# Patient Record
Sex: Female | Born: 1960
Health system: Southern US, Community
[De-identification: ages and names within clinical notes are randomized; demographics above are authoritative.]

## PROBLEM LIST (undated history)

## (undated) DIAGNOSIS — I10 Essential (primary) hypertension: Secondary | ICD-10-CM

## (undated) DIAGNOSIS — Z95810 Presence of automatic (implantable) cardiac defibrillator: Secondary | ICD-10-CM

## (undated) DIAGNOSIS — T884XXA Failed or difficult intubation, initial encounter: Secondary | ICD-10-CM

## (undated) DIAGNOSIS — F410 Panic disorder [episodic paroxysmal anxiety] without agoraphobia: Secondary | ICD-10-CM

## (undated) DIAGNOSIS — G4733 Obstructive sleep apnea (adult) (pediatric): Secondary | ICD-10-CM

## (undated) DIAGNOSIS — D689 Coagulation defect, unspecified: Secondary | ICD-10-CM

## (undated) DIAGNOSIS — I2699 Other pulmonary embolism without acute cor pulmonale: Secondary | ICD-10-CM

## (undated) DIAGNOSIS — E7800 Pure hypercholesterolemia, unspecified: Secondary | ICD-10-CM

## (undated) DIAGNOSIS — I420 Dilated cardiomyopathy: Secondary | ICD-10-CM

## (undated) DIAGNOSIS — F329 Major depressive disorder, single episode, unspecified: Secondary | ICD-10-CM

## (undated) DIAGNOSIS — G4730 Sleep apnea, unspecified: Secondary | ICD-10-CM

## (undated) DIAGNOSIS — R51 Headache: Secondary | ICD-10-CM

## (undated) DIAGNOSIS — E039 Hypothyroidism, unspecified: Secondary | ICD-10-CM

## (undated) DIAGNOSIS — D649 Anemia, unspecified: Secondary | ICD-10-CM

## (undated) DIAGNOSIS — I2510 Atherosclerotic heart disease of native coronary artery without angina pectoris: Secondary | ICD-10-CM

## (undated) DIAGNOSIS — R0609 Other forms of dyspnea: Secondary | ICD-10-CM

## (undated) DIAGNOSIS — IMO0001 Reserved for inherently not codable concepts without codable children: Secondary | ICD-10-CM

## (undated) DIAGNOSIS — I5022 Chronic systolic (congestive) heart failure: Secondary | ICD-10-CM

## (undated) DIAGNOSIS — F419 Anxiety disorder, unspecified: Secondary | ICD-10-CM

## (undated) DIAGNOSIS — R000 Tachycardia, unspecified: Secondary | ICD-10-CM

## (undated) DIAGNOSIS — M1990 Unspecified osteoarthritis, unspecified site: Secondary | ICD-10-CM

## (undated) DIAGNOSIS — E876 Hypokalemia: Secondary | ICD-10-CM

## (undated) DIAGNOSIS — N804 Endometriosis of rectovaginal septum and vagina: Secondary | ICD-10-CM

## (undated) DIAGNOSIS — I499 Cardiac arrhythmia, unspecified: Secondary | ICD-10-CM

## (undated) DIAGNOSIS — I428 Other cardiomyopathies: Secondary | ICD-10-CM

## (undated) DIAGNOSIS — R519 Headache, unspecified: Secondary | ICD-10-CM

## (undated) DIAGNOSIS — F32A Depression, unspecified: Secondary | ICD-10-CM

## (undated) DIAGNOSIS — N951 Menopausal and female climacteric states: Secondary | ICD-10-CM

## (undated) DIAGNOSIS — I509 Heart failure, unspecified: Secondary | ICD-10-CM

## (undated) DIAGNOSIS — I5020 Unspecified systolic (congestive) heart failure: Secondary | ICD-10-CM

## (undated) DIAGNOSIS — K219 Gastro-esophageal reflux disease without esophagitis: Secondary | ICD-10-CM

## (undated) DIAGNOSIS — E782 Mixed hyperlipidemia: Secondary | ICD-10-CM

## (undated) DIAGNOSIS — E079 Disorder of thyroid, unspecified: Secondary | ICD-10-CM

## (undated) DIAGNOSIS — I519 Heart disease, unspecified: Secondary | ICD-10-CM

## (undated) HISTORY — DX: Hypokalemia: E87.6

## (undated) HISTORY — PX: TUBAL LIGATION: SHX77

## (undated) HISTORY — DX: Endometriosis of rectovaginal septum and vagina: N80.4

## (undated) HISTORY — DX: Tachycardia, unspecified: R00.0

## (undated) HISTORY — PX: CARPAL TUNNEL RELEASE: SHX101

## (undated) HISTORY — PX: TONSILLECTOMY: SUR1361

## (undated) HISTORY — PX: HAND SURGERY: SHX662

## (undated) HISTORY — DX: Essential (primary) hypertension: I10

## (undated) HISTORY — DX: Anemia, unspecified: D64.9

## (undated) HISTORY — PX: CORONARY ANGIOPLASTY: SHX604

## (undated) HISTORY — PX: FOOT SURGERY: SHX648

## (undated) HISTORY — DX: Coagulation defect, unspecified: D68.9

## (undated) HISTORY — PX: MINOR HEMORRHOIDECTOMY: SHX6238

## (undated) HISTORY — DX: Chronic systolic (congestive) heart failure: I50.22

## (undated) HISTORY — DX: Disorder of thyroid, unspecified: E07.9

## (undated) HISTORY — DX: Presence of automatic (implantable) cardiac defibrillator: Z95.810

## (undated) HISTORY — DX: Sleep apnea, unspecified: G47.30

## (undated) HISTORY — DX: Menopausal and female climacteric states: N95.1

## (undated) HISTORY — PX: DIAGNOSTIC LAPAROSCOPY: SUR761

## (undated) HISTORY — PX: CARDIAC DEFIBRILLATOR PLACEMENT: SHX171

## (undated) HISTORY — DX: Other cardiomyopathies: I42.8

## (undated) HISTORY — PX: TRIGGER FINGER RELEASE: SHX641

## (undated) HISTORY — PX: TOTAL ABDOMINAL HYSTERECTOMY: SHX209

## (undated) HISTORY — PX: CORONARY ARTERY BYPASS GRAFT: SHX141

## (undated) HISTORY — DX: Pure hypercholesterolemia, unspecified: E78.00

## (undated) SURGERY — Surgical Case
Anesthesia: *Unknown

---

## 2001-01-29 HISTORY — PX: CARDIAC CATHETERIZATION: SHX172

## 2003-08-18 ENCOUNTER — Other Ambulatory Visit: Payer: Self-pay

## 2004-04-28 ENCOUNTER — Ambulatory Visit: Payer: Self-pay | Admitting: Internal Medicine

## 2004-07-07 ENCOUNTER — Other Ambulatory Visit: Payer: Self-pay

## 2004-07-08 ENCOUNTER — Observation Stay: Payer: Self-pay | Admitting: Internal Medicine

## 2004-07-08 ENCOUNTER — Other Ambulatory Visit: Payer: Self-pay

## 2004-11-23 ENCOUNTER — Ambulatory Visit: Payer: Self-pay | Admitting: Nurse Practitioner

## 2005-03-16 ENCOUNTER — Ambulatory Visit: Payer: Self-pay | Admitting: Podiatry

## 2005-08-09 ENCOUNTER — Ambulatory Visit: Payer: Self-pay | Admitting: Internal Medicine

## 2006-01-29 DIAGNOSIS — Z95810 Presence of automatic (implantable) cardiac defibrillator: Secondary | ICD-10-CM

## 2006-01-29 HISTORY — DX: Presence of automatic (implantable) cardiac defibrillator: Z95.810

## 2006-04-30 HISTORY — PX: CARDIAC DEFIBRILLATOR PLACEMENT: SHX171

## 2006-08-12 ENCOUNTER — Ambulatory Visit: Payer: Self-pay | Admitting: Family Medicine

## 2006-12-12 ENCOUNTER — Other Ambulatory Visit: Payer: Self-pay

## 2006-12-12 ENCOUNTER — Emergency Department: Payer: Self-pay | Admitting: Emergency Medicine

## 2007-01-09 ENCOUNTER — Ambulatory Visit: Payer: Self-pay | Admitting: Podiatry

## 2007-01-15 ENCOUNTER — Ambulatory Visit: Payer: Self-pay | Admitting: Podiatry

## 2007-08-06 ENCOUNTER — Emergency Department (HOSPITAL_COMMUNITY): Admission: EM | Admit: 2007-08-06 | Discharge: 2007-08-06 | Payer: Self-pay | Admitting: Emergency Medicine

## 2007-10-07 ENCOUNTER — Ambulatory Visit: Payer: Self-pay | Admitting: Family Medicine

## 2010-05-17 ENCOUNTER — Ambulatory Visit: Payer: Self-pay | Admitting: Family Medicine

## 2010-10-26 LAB — CBC
HCT: 38.8
Hemoglobin: 13.5
WBC: 5.2

## 2010-10-26 LAB — DIFFERENTIAL
Basophils Absolute: 0
Eosinophils Relative: 1
Lymphocytes Relative: 39
Lymphs Abs: 2
Monocytes Absolute: 0.4
Neutro Abs: 2.7

## 2010-10-26 LAB — POCT I-STAT, CHEM 8
BUN: 15
Calcium, Ion: 1.18
Chloride: 102
Creatinine, Ser: 0.9
Sodium: 138
TCO2: 30

## 2010-10-26 LAB — POCT CARDIAC MARKERS
Myoglobin, poc: 86.2
Myoglobin, poc: 90.3
Operator id: 133351
Operator id: 277751
Troponin i, poc: <0.05

## 2010-10-26 LAB — DIGOXIN LEVEL: Digoxin Level: 0.4 — ABNORMAL LOW

## 2011-07-16 ENCOUNTER — Ambulatory Visit: Payer: Self-pay | Admitting: Internal Medicine

## 2011-07-21 LAB — COMPREHENSIVE METABOLIC PANEL
Albumin: 3.2 g/dL — ABNORMAL LOW (ref 3.4–5.0)
Alkaline Phosphatase: 117 U/L (ref 50–136)
Anion Gap: 5 — ABNORMAL LOW (ref 7–16)
Bilirubin,Total: 0.3 mg/dL (ref 0.2–1.0)
Creatinine: 0.85 mg/dL (ref 0.60–1.30)
EGFR (Non-African Amer.): >60
Glucose: 94 mg/dL (ref 65–99)
Osmolality: 278 (ref 275–301)
SGPT (ALT): 31 U/L

## 2011-07-21 LAB — CBC
HCT: 33 % — ABNORMAL LOW (ref 35.0–47.0)
HGB: 10.9 g/dL — ABNORMAL LOW (ref 12.0–16.0)
MCV: 87 fL (ref 80–100)
Platelet: 118 10*3/uL — ABNORMAL LOW (ref 150–440)
RBC: 3.81 10*6/uL (ref 3.80–5.20)
RDW: 12.4 % (ref 11.5–14.5)

## 2011-07-21 LAB — TROPONIN I: Troponin-I: 0.03 ng/mL

## 2011-07-21 LAB — CK TOTAL AND CKMB (NOT AT ARMC): CK, Total: 72 U/L (ref 21–215)

## 2011-07-22 ENCOUNTER — Observation Stay: Payer: Self-pay | Admitting: Internal Medicine

## 2011-07-22 LAB — URINALYSIS, COMPLETE
Bacteria: NONE SEEN
Bilirubin,UR: NEGATIVE
Blood: NEGATIVE
Ketone: NEGATIVE
Leukocyte Esterase: NEGATIVE
Nitrite: NEGATIVE
Ph: 5 (ref 4.5–8.0)
RBC,UR: NONE SEEN /HPF (ref 0–5)
Specific Gravity: 1.005 (ref 1.003–1.030)
Squamous Epithelial: <1
WBC UR: <1 /HPF (ref 0–5)

## 2011-07-22 LAB — PRO B NATRIURETIC PEPTIDE: B-Type Natriuretic Peptide: 387 pg/mL — ABNORMAL HIGH (ref 0–125)

## 2011-07-22 LAB — DIGOXIN LEVEL: Digoxin: 0.88 ng/mL

## 2011-12-23 ENCOUNTER — Ambulatory Visit: Payer: Self-pay | Admitting: Internal Medicine

## 2012-02-12 ENCOUNTER — Emergency Department: Payer: Self-pay | Admitting: Emergency Medicine

## 2012-10-30 ENCOUNTER — Ambulatory Visit: Payer: Self-pay | Admitting: Family Medicine

## 2013-01-29 HISTORY — PX: COLONOSCOPY: SHX174

## 2013-02-16 ENCOUNTER — Ambulatory Visit: Payer: Self-pay | Admitting: Unknown Physician Specialty

## 2013-02-18 ENCOUNTER — Ambulatory Visit: Payer: Self-pay | Admitting: Unknown Physician Specialty

## 2013-05-08 DIAGNOSIS — I420 Dilated cardiomyopathy: Secondary | ICD-10-CM | POA: Insufficient documentation

## 2013-05-08 DIAGNOSIS — E0590 Thyrotoxicosis, unspecified without thyrotoxic crisis or storm: Secondary | ICD-10-CM | POA: Insufficient documentation

## 2013-05-12 DIAGNOSIS — G4730 Sleep apnea, unspecified: Secondary | ICD-10-CM | POA: Insufficient documentation

## 2013-05-14 ENCOUNTER — Ambulatory Visit: Payer: Self-pay | Admitting: Internal Medicine

## 2013-06-01 ENCOUNTER — Other Ambulatory Visit: Payer: Self-pay | Admitting: Internal Medicine

## 2013-06-01 ENCOUNTER — Ambulatory Visit: Payer: Self-pay | Admitting: Cardiology

## 2013-06-01 LAB — BASIC METABOLIC PANEL
Anion Gap: 4 — ABNORMAL LOW (ref 7–16)
BUN: 11 mg/dL (ref 7–18)
CALCIUM: 9.2 mg/dL (ref 8.5–10.1)
CO2: 29 mmol/L (ref 21–32)
Chloride: 104 mmol/L (ref 98–107)
Creatinine: 0.69 mg/dL (ref 0.60–1.30)
EGFR (Non-African Amer.): >60
Glucose: 107 mg/dL — ABNORMAL HIGH (ref 65–99)
Osmolality: 274 (ref 275–301)
Potassium: 3.6 mmol/L (ref 3.5–5.1)
Sodium: 137 mmol/L (ref 136–145)

## 2013-06-01 LAB — CBC WITH DIFFERENTIAL/PLATELET
Basophil #: 0 10*3/uL (ref 0.0–0.1)
Basophil %: 0.3 %
EOS PCT: 0.9 %
Eosinophil #: 0 10*3/uL (ref 0.0–0.7)
HCT: 41 % (ref 35.0–47.0)
HGB: 13.3 g/dL (ref 12.0–16.0)
Lymphocyte #: 2 10*3/uL (ref 1.0–3.6)
Lymphocyte %: 39.8 %
MCH: 29.3 pg (ref 26.0–34.0)
MCHC: 32.4 g/dL (ref 32.0–36.0)
MCV: 90 fL (ref 80–100)
Monocyte #: 0.4 x10 3/mm (ref 0.2–0.9)
Monocyte %: 8.6 %
NEUTROS ABS: 2.5 10*3/uL (ref 1.4–6.5)
Neutrophil %: 50.4 %
Platelet: 165 10*3/uL (ref 150–440)
RBC: 4.54 10*6/uL (ref 3.80–5.20)
RDW: 13 % (ref 11.5–14.5)
WBC: 5.1 10*3/uL (ref 3.6–11.0)

## 2013-06-01 LAB — URINALYSIS, COMPLETE
BLOOD: NEGATIVE
Bacteria: NONE SEEN
Bilirubin,UR: NEGATIVE
Glucose,UR: NEGATIVE mg/dL (ref 0–75)
Hyaline Cast: 7
KETONE: NEGATIVE
LEUKOCYTE ESTERASE: NEGATIVE
NITRITE: NEGATIVE
PH: 7 (ref 4.5–8.0)
PROTEIN: NEGATIVE
Specific Gravity: 1.01 (ref 1.003–1.030)

## 2013-06-01 LAB — APTT: Activated PTT: 31 secs (ref 23.6–35.9)

## 2013-06-01 LAB — PROTIME-INR
INR: 1.1
PROTHROMBIN TIME: 14 s (ref 11.5–14.7)

## 2013-06-01 LAB — T4, FREE: FREE THYROXINE: 2.26 ng/dL — AB (ref 0.76–1.46)

## 2013-06-01 LAB — TSH: Thyroid Stimulating Horm: <0.01 u[IU]/mL — ABNORMAL LOW

## 2013-06-05 ENCOUNTER — Ambulatory Visit: Payer: Self-pay | Admitting: Cardiology

## 2013-06-21 HISTORY — PX: CARDIAC DEFIBRILLATOR PLACEMENT: SHX171

## 2013-09-29 DIAGNOSIS — N8042 Endometriosis of rectovaginal septum with involvement of vagina: Secondary | ICD-10-CM

## 2013-09-29 DIAGNOSIS — N804 Endometriosis of rectovaginal septum and vagina: Secondary | ICD-10-CM

## 2013-09-29 HISTORY — DX: Endometriosis of rectovaginal septum and vagina: N80.4

## 2013-09-29 HISTORY — PX: LAPAROSCOPIC SALPINGOOPHERECTOMY: SUR795

## 2013-09-29 HISTORY — DX: Endometriosis of rectovaginal septum with involvement of vagina: N80.42

## 2013-10-06 ENCOUNTER — Ambulatory Visit: Payer: Self-pay | Admitting: Obstetrics and Gynecology

## 2013-10-06 LAB — HEMOGLOBIN: HGB: 13 g/dL (ref 12.0–16.0)

## 2013-10-12 ENCOUNTER — Ambulatory Visit: Payer: Self-pay | Admitting: Obstetrics and Gynecology

## 2013-10-13 LAB — PATHOLOGY REPORT

## 2014-02-02 ENCOUNTER — Encounter: Payer: Self-pay | Admitting: Cardiovascular Disease

## 2014-02-02 ENCOUNTER — Ambulatory Visit (INDEPENDENT_AMBULATORY_CARE_PROVIDER_SITE_OTHER): Payer: Medicare Other | Admitting: Cardiovascular Disease

## 2014-02-02 VITALS — BP 110/76 | HR 95 | Ht 67.0 in | Wt 229.5 lb

## 2014-02-02 DIAGNOSIS — Z95810 Presence of automatic (implantable) cardiac defibrillator: Secondary | ICD-10-CM

## 2014-02-02 DIAGNOSIS — I10 Essential (primary) hypertension: Secondary | ICD-10-CM

## 2014-02-02 DIAGNOSIS — E7800 Pure hypercholesterolemia, unspecified: Secondary | ICD-10-CM | POA: Insufficient documentation

## 2014-02-02 DIAGNOSIS — I5042 Chronic combined systolic (congestive) and diastolic (congestive) heart failure: Secondary | ICD-10-CM | POA: Insufficient documentation

## 2014-02-02 DIAGNOSIS — R0600 Dyspnea, unspecified: Secondary | ICD-10-CM

## 2014-02-02 DIAGNOSIS — I5022 Chronic systolic (congestive) heart failure: Secondary | ICD-10-CM

## 2014-02-02 DIAGNOSIS — I471 Supraventricular tachycardia: Secondary | ICD-10-CM

## 2014-02-02 MED ORDER — POTASSIUM CHLORIDE ER 10 MEQ PO CPCR: 10.0000 meq | ORAL_CAPSULE | Freq: Every day | ORAL | Status: DC

## 2014-02-02 NOTE — Assessment & Plan Note (Signed)
I'm referring her to Dr. Caryl Comes to establish with our device clinic.

## 2014-02-02 NOTE — Progress Notes (Signed)
Primary care physician: Dr. Rosario Jacks   HPI  This is a 54 year old African-American female who is here today to establish cardiovascular care. She used to see Roderic Palau at [facility]. She has known history of chronic systolic heart failure diagnosed in early 2000 due to nonischemic cardiomyopathy. She had cardiac catheterization done in 2003 which showed no significant coronary artery disease. She had an ICD placement in 2008 with generator replacement in May 2015 done by Mylinda Latina. She has known history of sleep apnea, hypertension and hyperthyroidism. She received radioactive iodine treatment and currently is on thyroid replacement therapy. She was hyperthyroid recently. This is being managed by Dr. Rosario Jacks. She had a nuclear stress test done in March 2014 which showed a fixed anterior apical defect with ejection fraction of 57%. She has not had a recent echocardiogram. She reports being taken off fosinopril due to low blood pressure. She has been doing reasonably well and denies any chest pain. She has chronic exertional dyspnea with mild to moderate activities. No orthopnea, PND or lower extremity edema. She did experience increased palpitations in the setting of hyperthyroidism. She is not a smoker and reports consuming small amount of alcohol. There is no family history of coronary artery disease, arrhythmia or sudden death.    Allergies  Allergen Reactions  . Sulfamethoxazole-Trimethoprim     Other reaction(s): Unknown     No current outpatient prescriptions on file prior to visit.   No current facility-administered medications on file prior to visit.     Past Medical History  Diagnosis Date  . Thyroid disease   . Menopausal symptoms   . Goiter   . Anemia   . Tachycardia   . Hypertension   . Hypokalemia   . Clotting disorder   . Chronic systolic heart failure   . Sleep apnea   . Essential hypertension   . Hypercholesteremia      Past Surgical History  Procedure Laterality  Date  . Cardiac defibrillator placement    . Total abdominal hysterectomy    . Cardiac catheterization  2003    [facility]: No significant coronary artery disease with reduced ejection fraction.     Family History  Problem Relation Age of Onset  . Hypertension Mother   . Hyperlipidemia Mother   . Stroke Mother      History   Social History  . Marital Status: Divorced    Spouse Name: N/A    Number of Children: N/A  . Years of Education: N/A   Occupational History  . Not on file.   Social History Main Topics  . Smoking status: Never Smoker   . Smokeless tobacco: Not on file  . Alcohol Use: No  . Drug Use: No  . Sexual Activity: Not on file   Other Topics Concern  . Not on file   Social History Narrative  . No narrative on file     ROS A 10 point review of system was performed. It is negative other than that mentioned in the history of present illness.   PHYSICAL EXAM   BP 110/76 mmHg  Pulse 95  Ht 5\' 7"  (1.702 m)  Wt 229 lb 8 oz (104.101 kg)  BMI 35.94 kg/m2 Constitutional: She is oriented to person, place, and time. She appears well-developed and well-nourished. No distress.  HENT: No nasal discharge.  Head: Normocephalic and atraumatic.  Eyes: Pupils are equal and round. No discharge.  Neck: Normal range of motion. Neck supple. No JVD present. No thyromegaly present.  Cardiovascular: Normal rate, regular rhythm, normal heart sounds. Exam reveals no gallop and no friction rub. No murmur heard.  Pulmonary/Chest: Effort normal and breath sounds normal. No stridor. No respiratory distress. She has no wheezes. She has no rales. She exhibits no tenderness.  Abdominal: Soft. Bowel sounds are normal. She exhibits no distension. There is no tenderness. There is no rebound and no guarding.  Musculoskeletal: Normal range of motion. She exhibits no edema and no tenderness.  Neurological: She is alert and oriented to person, place, and time. Coordination normal.  Skin:  Skin is warm and dry. No rash noted. She is not diaphoretic. No erythema. No pallor.  Psychiatric: She has a normal mood and affect. Her behavior is normal. Judgment and thought content normal.     EKG: Sinus  Rhythm  -  Diffuse nonspecific T-abnormality.   ABNORMAL    ASSESSMENT AND PLAN

## 2014-02-02 NOTE — Assessment & Plan Note (Signed)
She seems to be in [place] class II. There is no evidence of fluid overload. She is currently not on an ACE inhibitor. No recent evaluation of ejection fraction. I requested an echocardiogram for evaluation. If her EF is low, there is a strong indication to resuming an ACE inhibitor . She has been taking carvedilol 25 mg once daily. I changed this to 12.5 mg twice daily.

## 2014-02-02 NOTE — Patient Instructions (Signed)
Your physician has recommended you make the following change in your medication:  1) Decrease coreg to 25 mg 1/2 tablet (12.5 mg) by mouth twice daily  Your physician has requested that you have an echocardiogram. Echocardiography is a painless test that uses sound waves to create images of your heart. It provides your doctor with information about the size and shape of your heart and how well your heart's chambers and valves are working. This procedure takes approximately one hour. There are no restrictions for this procedure.  Your physician wants you to follow-up in: 3 months with Dr. Fletcher Anon. You will receive a reminder letter in the mail two months in advance. If you don't receive a letter, please call our office to schedule the follow-up appointment.  Your physician recommends that you schedule a follow-up appointment: next available with Dr. Caryl Comes to establish care of your Medtronic defibrillator (ICD)

## 2014-02-02 NOTE — Assessment & Plan Note (Signed)
Blood pressure is well controlled on current medications. 

## 2014-02-03 ENCOUNTER — Telehealth: Payer: Self-pay | Admitting: Cardiovascular Disease

## 2014-02-03 ENCOUNTER — Other Ambulatory Visit (HOSPITAL_COMMUNITY): Payer: Self-pay | Admitting: Cardiology

## 2014-02-03 DIAGNOSIS — R0600 Dyspnea, unspecified: Secondary | ICD-10-CM

## 2014-02-03 NOTE — Telephone Encounter (Signed)
Pt needs to make apt with dr. Caryl Comes per dr Fletcher Anon. Pt is aware the next apt will be in march.

## 2014-02-18 ENCOUNTER — Other Ambulatory Visit (INDEPENDENT_AMBULATORY_CARE_PROVIDER_SITE_OTHER): Payer: Medicare Other

## 2014-02-18 ENCOUNTER — Other Ambulatory Visit: Payer: Self-pay

## 2014-02-18 DIAGNOSIS — I509 Heart failure, unspecified: Secondary | ICD-10-CM

## 2014-02-18 DIAGNOSIS — I471 Supraventricular tachycardia: Secondary | ICD-10-CM

## 2014-02-18 DIAGNOSIS — R0600 Dyspnea, unspecified: Secondary | ICD-10-CM

## 2014-02-22 NOTE — Progress Notes (Signed)
LVM 1/25

## 2014-02-23 ENCOUNTER — Telehealth: Payer: Self-pay | Admitting: Cardiovascular Disease

## 2014-02-23 NOTE — Telephone Encounter (Signed)
See Echo result note 1/26

## 2014-02-23 NOTE — Telephone Encounter (Signed)
Pt calling stating that she received a call from [place] but was not sure what it was about. i looked for a phone note but did not see one.  Pt thinks it may be about her Echo she did last week.

## 2014-02-25 ENCOUNTER — Telehealth: Payer: Self-pay | Admitting: Cardiovascular Disease

## 2014-02-25 DIAGNOSIS — I5022 Chronic systolic (congestive) heart failure: Secondary | ICD-10-CM

## 2014-02-25 MED ORDER — LISINOPRIL 5 MG PO TABS: 5.0000 mg | ORAL_TABLET | Freq: Every day | ORAL | Status: DC

## 2014-02-25 NOTE — Telephone Encounter (Signed)
Patient returning call to [place].  Transferred Patient to nurse.

## 2014-02-25 NOTE — Telephone Encounter (Signed)
-----   Message from Wellington Hampshire, MD sent at 02/20/2014  1:47 PM EST ----- Inform patient that echo showed mildly reduced EF (40-45%). I recommend starting Lisinopril 5 mg once daily. Stop Potassium chloride. Check BMP 1 week after starting Lisinopril.

## 2014-02-25 NOTE — Telephone Encounter (Signed)
Informed patient of Dr Jacklynn Ganong instructions Patient verbalized understanding

## 2014-03-04 ENCOUNTER — Other Ambulatory Visit (INDEPENDENT_AMBULATORY_CARE_PROVIDER_SITE_OTHER): Payer: Medicare Other | Admitting: *Deleted

## 2014-03-04 DIAGNOSIS — I5022 Chronic systolic (congestive) heart failure: Secondary | ICD-10-CM

## 2014-03-05 LAB — BASIC METABOLIC PANEL
BUN/Creatinine Ratio: 13 (ref 9–23)
BUN: 11 mg/dL (ref 6–24)
CALCIUM: 9.4 mg/dL (ref 8.7–10.2)
CO2: 23 mmol/L (ref 18–29)
CREATININE: 0.84 mg/dL (ref 0.57–1.00)
Chloride: 101 mmol/L (ref 97–108)
GFR calc non Af Amer: 81 mL/min/{1.73_m2} (ref >59)
GFR, EST AFRICAN AMERICAN: 93 mL/min/{1.73_m2} (ref >59)
Glucose: 112 mg/dL — ABNORMAL HIGH (ref 65–99)
Potassium: 4.1 mmol/L (ref 3.5–5.2)
SODIUM: 138 mmol/L (ref 134–144)

## 2014-03-09 ENCOUNTER — Ambulatory Visit: Payer: Self-pay | Admitting: Internal Medicine

## 2014-03-10 ENCOUNTER — Telehealth: Payer: Self-pay | Admitting: *Deleted

## 2014-03-10 NOTE — Telephone Encounter (Signed)
Error

## 2014-03-15 ENCOUNTER — Ambulatory Visit: Payer: Self-pay | Admitting: Internal Medicine

## 2014-03-16 ENCOUNTER — Encounter: Payer: Self-pay | Admitting: General Surgery

## 2014-03-22 ENCOUNTER — Encounter: Payer: Self-pay | Admitting: General Surgery

## 2014-03-22 ENCOUNTER — Ambulatory Visit (INDEPENDENT_AMBULATORY_CARE_PROVIDER_SITE_OTHER): Payer: Medicare Other | Admitting: General Surgery

## 2014-03-22 ENCOUNTER — Other Ambulatory Visit: Payer: Medicare Other

## 2014-03-22 VITALS — BP 128/70 | HR 74 | Resp 12 | Ht 67.0 in | Wt 232.0 lb

## 2014-03-22 DIAGNOSIS — N63 Unspecified lump in breast: Secondary | ICD-10-CM

## 2014-03-22 DIAGNOSIS — N6320 Unspecified lump in the left breast, unspecified quadrant: Secondary | ICD-10-CM

## 2014-03-22 HISTORY — PX: BREAST CYST ASPIRATION: SHX578

## 2014-03-22 NOTE — Patient Instructions (Signed)
Patient to return in three months.  

## 2014-03-22 NOTE — Progress Notes (Signed)
Patient ID: Lisa Fuentes, female   DOB: 01-Apr-1960, 54 y.o.   MRN: [id number]  Chief Complaint  Patient presents with  . Other    mammogram    HPI Lisa Fuentes is a 54 y.o. female who presents for a breast evaluation. The most recent mammogram on 03/09/14  and ultrasound was done on 03/15/14  Patient does not perform regular self breast checks and gets regular mammograms done.    HPI  Past Medical History  Diagnosis Date  . Thyroid disease   . Menopausal symptoms   . Goiter   . Anemia   . Tachycardia   . Hypertension   . Hypokalemia   . Clotting disorder   . Chronic systolic heart failure   . Sleep apnea   . Essential hypertension   . Hypercholesteremia     Past Surgical History  Procedure Laterality Date  . Cardiac defibrillator placement    . Total abdominal hysterectomy    . Cardiac catheterization  2003    [facility]: No significant coronary artery disease with reduced ejection fraction.  . Tonsillectomy    . Foot surgery    . Colonoscopy  2015    Family History  Problem Relation Age of Onset  . Hypertension Mother   . Hyperlipidemia Mother   . Stroke Mother   . Colon cancer Mother     Social History History  Substance Use Topics  . Smoking status: Never Smoker   . Smokeless tobacco: Not on file  . Alcohol Use: 0.0 oz/week    0 Standard drinks or equivalent per week    Allergies  Allergen Reactions  . Sulfamethoxazole-Trimethoprim     Other reaction(s): Unknown    Current Outpatient Prescriptions  Medication Sig Dispense Refill  . aspirin EC 81 MG tablet Take by mouth daily.    . carvedilol (COREG) 25 MG tablet Take 1/2 tablet (12.5 mg) by mouth twice daily    . citalopram (CELEXA) 40 MG tablet TAKE ONE TABLET BY MOUTH ONCE DAILY    . digoxin (LANOXIN) 0.125 MG tablet TAKE ONE TABLET BY MOUTH ONCE DAILY    . furosemide (LASIX) 40 MG tablet TAKE ONE TABLET BY MOUTH ONCE DAILY *PATIENT OCCASIONALLY TAKES AN EXTRA TABLET*    . levothyroxine  (SYNTHROID, LEVOTHROID) 75 MCG tablet Take 75 mcg by mouth daily before breakfast.    . lisinopril (PRINIVIL,ZESTRIL) 5 MG tablet Take 1 tablet (5 mg total) by mouth daily. 30 tablet 6  . spironolactone (ALDACTONE) 25 MG tablet TAKE ONE-HALF TABLET BY MOUTH ONCE DAILY     No current facility-administered medications for this visit.    Review of Systems Review of Systems  Constitutional: Negative.   Respiratory: Negative.   Cardiovascular: Negative.     Blood pressure 128/70, pulse 74, resp. rate 12, height 5\' 7"  (1.702 m), weight 232 lb (105.235 kg).  Physical Exam Physical Exam  Constitutional: She appears well-developed and well-nourished.  Eyes: Conjunctivae are normal. No scleral icterus.  Neck: Neck supple.  Cardiovascular: Normal rate, regular rhythm and normal heart sounds.   Pulmonary/Chest: Effort normal and breath sounds normal. Right breast exhibits no inverted nipple, no mass, no nipple discharge, no skin change and no tenderness. Left breast exhibits no inverted nipple, no mass, no nipple discharge, no skin change and no tenderness.    Abdominal: Soft. Bowel sounds are normal. There is no tenderness.  Lymphadenopathy:    She has no cervical adenopathy.    She has no axillary adenopathy.  Neurological: She is alert.  Skin: Skin is warm and dry.    Data Reviewed Mammogram and ultrasound reviewed. A lobulated hypoechoic 21mm mass left breast 8ocl location. This does not have typical features of a benign cyst.   Assessment    Left breast mass/cyst. FNA and possible core biopsy discussed with pt and she is agreeable.    Plan    US guided aspiration of the cyst in left breast completed . After one pass of the 22 g needle the cyst disappeared. One drop of clear fluid obtained and sent for cytology.     Recheck in 2-3 mos.    Biance Moncrief G 03/23/2014, 1:12 PM

## 2014-03-23 ENCOUNTER — Encounter: Payer: Self-pay | Admitting: General Surgery

## 2014-03-25 LAB — FINE-NEEDLE ASPIRATION

## 2014-03-26 ENCOUNTER — Telehealth: Payer: Self-pay

## 2014-03-26 NOTE — Telephone Encounter (Signed)
-----   Message from Christene Lye, MD sent at 03/26/2014  9:10 AM EST ----- Please let pt pt know the pathology was normal.

## 2014-03-26 NOTE — Telephone Encounter (Signed)
Notified patient as instructed, patient pleased. Discussed follow-up appointments, patient agrees. Patient in recalls for May to follow up.

## 2014-03-30 ENCOUNTER — Telehealth: Payer: Self-pay

## 2014-03-30 NOTE — Telephone Encounter (Signed)
l mom to see if pt could come in tomorrow, due to Dr. Caryl Comes opeing his [place] schedule.(Pt is scheduled on 3/8/)

## 2014-04-06 ENCOUNTER — Encounter: Payer: Self-pay | Admitting: Internal Medicine

## 2014-04-06 ENCOUNTER — Ambulatory Visit (INDEPENDENT_AMBULATORY_CARE_PROVIDER_SITE_OTHER): Payer: Medicare Other | Admitting: Internal Medicine

## 2014-04-06 VITALS — BP 102/72 | HR 78 | Ht 67.0 in | Wt 225.5 lb

## 2014-04-06 DIAGNOSIS — I10 Essential (primary) hypertension: Secondary | ICD-10-CM

## 2014-04-06 DIAGNOSIS — I5022 Chronic systolic (congestive) heart failure: Secondary | ICD-10-CM

## 2014-04-06 LAB — MDC_IDC_ENUM_SESS_TYPE_INCLINIC
Battery Voltage: 3 V
Brady Statistic RV Percent Paced: 0.01 %
HIGH POWER IMPEDANCE MEASURED VALUE: 19 Ohm
HighPow Impedance: 52 Ohm
HighPow Impedance: 65 Ohm
Lead Channel Sensing Intrinsic Amplitude: 11.625 mV
Lead Channel Sensing Intrinsic Amplitude: 14.5 mV
Lead Channel Setting Sensing Sensitivity: 0.3 mV
MDC IDC MSMT BATTERY REMAINING LONGEVITY: 132 mo
MDC IDC MSMT LEADCHNL RV IMPEDANCE VALUE: 532 Ohm
MDC IDC MSMT LEADCHNL RV PACING THRESHOLD AMPLITUDE: 0.5 V
MDC IDC MSMT LEADCHNL RV PACING THRESHOLD PULSEWIDTH: 0.4 ms
MDC IDC SESS DTM: 20160308111439
MDC IDC SET LEADCHNL RV PACING AMPLITUDE: 2 V
MDC IDC SET LEADCHNL RV PACING PULSEWIDTH: 0.4 ms
MDC IDC SET ZONE DETECTION INTERVAL: 360 ms
Zone Setting Detection Interval: 300 ms
Zone Setting Detection Interval: 360 ms

## 2014-04-06 NOTE — Progress Notes (Signed)
ELECTROPHYSIOLOGY CONSULT NOTE  Patient ID: Lisa Fuentes, MRN: [id number], DOB/AGE: April 21, 1960 54 y.o. Admit date: (Not on file) Date of Consult: 04/06/2014  Primary Physician: Casilda Carls, MD Primary Cardiologist: MA  Chief Complaint: ICD to establish   HPI Lisa Fuentes is a 54 y.o. female  Seen to establish ICD follow-up.  She initially had a defibrillator implanted 2008 with generator replacement 2015.  She has a history of nonischemic cardiomyopathy and congestive heart failure diagnosed about 10 years ago. Catheterization 2003 demonstrated no obstructive coronary disease. Interval nuclear imaging in May of March 2014 demonstrated fixed anterior apical defect with a normal ejection fraction. Repeat echocardiogram 1/16 demonstrated modest impairment of LV function with ejection fraction of 40-45%; other parameters were normal  She snores and has treated sleep apnea.  She does not have peripheral edema nocturnal dyspnea or orthopnea.  She has had no ICD discharges.  She has a history of hyperthyroidism which is currently under therapy. She still feels somewhat hyperthyroid  She is a mother of 3 children; her youngest boy died a few years ago traumatically gray.  Potassium level last was 4.1  Past Medical History  Diagnosis Date  . Thyroid disease   . Menopausal symptoms   . Goiter   . Anemia   . Tachycardia   . Hypertension   . Hypokalemia   . Clotting disorder   . Chronic systolic heart failure   . Sleep apnea   . Essential hypertension   . Hypercholesteremia       Surgical History:  Past Surgical History  Procedure Laterality Date  . Cardiac defibrillator placement    . Total abdominal hysterectomy    . Cardiac catheterization  2003    [facility]: No significant coronary artery disease with reduced ejection fraction.  . Tonsillectomy    . Foot surgery    . Colonoscopy  2015     Home Meds: Prior to Admission medications   Medication Sig Start Date  End Date Taking? Authorizing Provider  aspirin EC 81 MG tablet Take by mouth daily.   Yes Historical Provider, MD  carvedilol (COREG) 25 MG tablet Take 1/2 tablet (12.5 mg) by mouth twice daily   Yes Historical Provider, MD  citalopram (CELEXA) 40 MG tablet TAKE ONE TABLET BY MOUTH ONCE DAILY 11/05/13  Yes Historical Provider, MD  digoxin (LANOXIN) 0.125 MG tablet TAKE ONE TABLET BY MOUTH ONCE DAILY 02/16/14  Yes Historical Provider, MD  furosemide (LASIX) 40 MG tablet TAKE ONE TABLET BY MOUTH ONCE DAILY *PATIENT OCCASIONALLY TAKES AN EXTRA TABLET* 12/02/13  Yes Historical Provider, MD  levothyroxine (SYNTHROID, LEVOTHROID) 125 MCG tablet Take 125 mcg by mouth daily before breakfast.   Yes Historical Provider, MD  lisinopril (PRINIVIL,ZESTRIL) 5 MG tablet Take 1 tablet (5 mg total) by mouth daily. 02/25/14  Yes Wellington Hampshire, MD  pantoprazole (PROTONIX) 40 MG tablet Take 40 mg by mouth daily.   Yes Historical Provider, MD  spironolactone (ALDACTONE) 25 MG tablet TAKE ONE-HALF TABLET BY MOUTH ONCE DAILY 10/06/13  Yes Historical Provider, MD      Allergies:  Allergies  Allergen Reactions  . Sulfamethoxazole-Trimethoprim     Other reaction(s): Unknown    History   Social History  . Marital Status: Divorced    Spouse Name: N/A  . Number of Children: N/A  . Years of Education: N/A   Occupational History  . Not on file.   Social History Main Topics  . Smoking status: Never Smoker   .  Smokeless tobacco: Not on file  . Alcohol Use: 0.0 oz/week    0 Standard drinks or equivalent per week  . Drug Use: No  . Sexual Activity: Not on file   Other Topics Concern  . Not on file   Social History Narrative     Family History  Problem Relation Age of Onset  . Hypertension Mother   . Hyperlipidemia Mother   . Stroke Mother   . Colon cancer Mother      ROS:  Please see the history of present illness.    All other systems reviewed and negative.    Physical Exam: Blood pressure  102/72, pulse 78, height 5\' 7"  (1.702 m), weight 225 lb 8 oz (102.286 kg). General: Well developed, well nourished female in no acute distress. Head: Normocephalic, atraumatic, sclera non-icteric, no xanthomas, nares are without discharge. EENT: normal Lymph Nodes:  none Back: without scoliosis/kyphosis, no CVA tendersness Neck: Negative for carotid bruits. JVD not elevated. Lungs: Clear bilaterally to auscultation without wheezes, rales, or rhonchi. Breathing is unlabored. Device pocket well healed; without hematoma or erythema.  There is no tethering Heart: RRR with S1 S2. No  murmur , rubs, or gallops appreciated. Abdomen: Soft, non-tender, non-distended with normoactive bowel sounds. No hepatomegaly. No rebound/guarding. No obvious abdominal masses. Msk:  Strength and tone appear normal for age. Extremities: No clubbing or cyanosis. No edema.  Distal pedal pulses are 2+ and equal bilaterally. Skin: Warm and Dry Neuro: Alert and oriented X 3. CN III-XII intact Grossly normal sensory and motor function . Psych:  Responds to questions appropriately with a normal affect.      Labs: Cardiac Enzymes No results for input(s): CKTOTAL, CKMB, TROPONINI in the last 72 hours. CBC Lab Results  Component Value Date   WBC 5.2 08/06/2007   HGB 13.9 08/06/2007   HCT 41.0 08/06/2007   MCV 93.3 08/06/2007   PLT 152 08/06/2007   PROTIME: No results for input(s): LABPROT, INR in the last 72 hours. Chemistry No results for input(s): NA, K, CL, CO2, BUN, CREATININE, CALCIUM, PROT, BILITOT, ALKPHOS, ALT, AST, GLUCOSE in the last 168 hours.  Invalid input(s): LABALBU Lipids No results found for: CHOL, HDL, LDLCALC, TRIG BNP No results found for: PROBNP Miscellaneous No results found for: DDIMER  Radiology/Studies:  No results found.  EKG:  Sinus rhythm at 97 Intervals 16/08/30 Exline nonspecific ST-T changes   Assessment and Plan:  Nonischemic cardiomyopathy with interval improvement EF  most recently 40-45%  Implantable defibrillator-Medtronic  Sinus tachycardia  Hypertension  Treated sleep apnea  She is doing well. She is euvolemic. There've been no ICD discharges. We have reviewed are protocol for ICD follow-up We reviewed her medications. With interval improvement in LV function, it would be reasonable to stop her digoxin. Also, I suggested that she talk with Dr. Gaylyn Cheers about stopping or decreasing her aspirin.  We have reviewed the importance of close monitoring of potassium levels on her Aldactone. She will arrange for every 3 month follow-up for this.  Device interrogation demonstrates that her heart rates really are typically much better controlled now that her thyroid issues are improved. We'll follow this along.      Virl Axe

## 2014-04-06 NOTE — Patient Instructions (Signed)
Your physician has recommended you make the following change in your medication:  Stop Digoxin   Your physician wants you to follow-up in: Dr. Fletcher Anon in September/Octoner of this year. You will receive a reminder letter in the mail two months in advance. If you don't receive a letter, please call our office to schedule the follow-up appointment.  Your physician wants you to follow-up in: Dr. Caryl Comes in April 2017. You will receive a reminder letter in the mail two months in advance. If you don't receive a letter, please call our office to schedule the follow-up appointment.

## 2014-04-29 ENCOUNTER — Encounter: Payer: Self-pay | Admitting: *Deleted

## 2014-05-04 ENCOUNTER — Ambulatory Visit: Payer: Medicaid Other | Admitting: Cardiovascular Disease

## 2014-05-22 NOTE — Op Note (Signed)
PATIENT NAME:  Lisa Fuentes, Lisa Fuentes MR#:  [id number] DATE OF BIRTH:  [birth year]  DATE OF PROCEDURE:  06/05/2013  TITLE OF PROCEDURE NOTE:  Implantation of a new single-chamber implantable cardiac defibrillator generator.   ATTENDING SURGEON:  Priscille Heidelberg. Marcello Moores, MD  INDICATION:  Implantable cardiac defibrillator at elective replacement indicator.   DETAILS OF THE PROCEDURE:  The patient was brought to the Operating Room in a fasting, nonsedated state. The patient was prepped and draped in the usual sterile manner. The area of the left infraclavicular fossa was scrubbed with chlorhexidine. The appropriate resuscitative equipment was attached to the patient including defibrillator pads. Using a scalpel, an incision was made over the old incision site. Using a combination of electrocautery and blunt dissection, the ICD generator was extracted from the pocket and the leads were subsequently dissected free. The pocket was then revised to accommodate the new defibrillator. The leads were then disconnected from the defibrillator and the defibrillator was given to the manufacturer for return product. The leads were tested and showed the following values:  Sensed R-wave of 12.4 mV, a pacing threshold of 1v at 0.5 msec and an impedance of 418 ohms. At this juncture, the pocket was irrigated with copious amounts of gentamicin solution and adequate hemostasis was obtained. The new generator was attached to the lead and the generator and lead were secured in the pocket. The pocket was then closed with running layers of 2-0 and 3-0 Vicryl and the subcuticular layer with 4-0 Vicryl. Steri-Strips were applied over the incision site as well as a sterile gauze and OpSite.   SUMMARY OF IMPLANTED HARDWARE:  The patient received a Medtronic single-chamber ICD, model number Evera XT, model number [id number]. The serial number is [id number]. The RV lead is a Guidant model [id number], implanted 05/09/2006, serial number [id number].   FINAL  PROGRAM PARAMETERS:  The device was programmed with brady parameters:  VVI with a lower rate limit of 40. VF was set at 200 beats per minute with 30 out of 40 detection. A monitor zone was set at 167 with 32 for the initial detection counters.    There were no complications noted at the conclusion of the procedure. There was no fluoroscopy utilized.   ____________________________ Priscille Heidelberg Marcello Moores, MD [email or web address] D: 06/05/2013 14:01:03 ET T: 06/05/2013 15:46:09 ET JOB#: [id number]  cc: Lennette Bihari L. Marcello Moores, MD, <Dictator> Marzetta Board MD ELECTRONICALLY SIGNED 07/02/2013 16:48

## 2014-05-22 NOTE — Op Note (Signed)
PATIENT NAME:  Lisa Fuentes, WESTERHOLD MR#:  [id number] DATE OF BIRTH:  1960-02-01  DATE OF PROCEDURE:  10/12/2013  PREOPERATIVE DIAGNOSES:  1.  Right ovarian cyst.  2.  Right-sided pelvic pain.  3.  History of endometriosis.  POSTOPERATIVE DIAGNOSES:  1.  Right ovarian cyst.  2.  Right-sided pelvic pain.  3.  History of endometriosis. 4.  Filmy pelvic adhesions.  PROCEDURES: Laparoscopic bilateral salpingectomy and right oophorectomy.  SURGEON: Rubie Maid, MD   ASSISTANT: Malachi Paradise, MD  ANESTHESIA: General.   ESTIMATED BLOOD LOSS: Minimal.   OPERATIVE FLUIDS: 350 mL.   URINE OUTPUT: 50 mL.   COMPLICATIONS: None.   FINDINGS: Filmy pelvic adhesions  of the peritoneum to the left pelvic sidewall and of the right fallopian tube to the right pelvic sidewall. There were endometriotic implants on the edges of the stump and along the dorsal surface. There were normal-appearing left and right fallopian tubes and ovaries with the exception of a small left peritubal cyst.   SPECIMENS: Right and left fallopian tube and right ovary.   DESCRIPTION OF PROCEDURE: The patient was taken to the operating room where she was placed under general anesthesia without difficulty. She was then prepped and draped in normal sterile fashion and placed in the dorsal lithotomy position. A straight catheterization was performed with return of 50 mL of clear urine. Next, a sponge stick was then placed into the patient's vagina for manipulation. Attention was then turned to the abdomen where a 5 mm infraumbilical incision was made through the skin. After this a 5 mm trocar with sheath was inserted into the incision under direct visualization with a 5 mm laparoscope. Once confirmation of entrance into the abdomen was performed, the abdomen was then insufflated with carbon dioxide. After adequate insufflation, the abdomen was inspected and the above findings were noted. At this time, a 5 mm skin incision was made  on the patient's left lower abdomen and a 5 mm trocar and sheath were then introduced under direct visualization. The same procedure was performed on the right side with a 10 mm trocar inserted. Next, the filmy adhesions were taken down using the Harmonic scalpel. The left fallopian tube was then grasped, elevated and transected off using the Harmonic scalpel device. This was removed through the 10 mm port and sent to the pathologist. Attention was then turned to the right fallopian tube and ovary. The right fallopian tube was dissected off of the lateral pelvic sidewall and the infundibulopelvic ligament was then transected and coaged using the Harmonic scalpel with removal of the right ovary and fallopian tube. Kleppingers were then used at the pedicle of the removed ovary and fallopian tube and were used to cauterize with good hemostasis noted. Attention was then turned to the cervical stump where the endometriotic implants were fulgurated using the Kleppingers. Survey of the abdomen revealed good hemostasis. The Endo Catch bag was then inserted into the patient's abdomen through the 10 mm trocar. The right ovary and tube were then removed through the incision, contained in the Endo Catch bag. The trocar was then replaced into the prior incision and the abdomen was allowed to insufflate once again and again the abdomen was noted to be hemostatic. The lateral trocars were removed and the sheaths were removed under laparoscopic visualization. The laparoscope was then removed. The carbon dioxide was allowed to escape from the abdomen. The skin incisions were then injected with 10 mL total of 0.5% Sensorcaine. After this the right trocar site  was then approximated using 2-0 Vicryl in the interrupted fashion. The skin incisions were then all covered using Dermabond. The patient was awakened and taken to the recovery room in stable condition. Instrument, sponge and needle counts were correct x2 at the end of the  procedure.  ____________________________ Chesley Noon. Marcelline Mates, MD asc:sb D: 10/12/2013 10:03:57 ET T: 10/12/2013 10:23:58 ET JOB#: [id number]  cc: Chesley Noon. Marcelline Mates, MD, <Dictator> Augusto Gamble MD ELECTRONICALLY SIGNED 10/26/2013 11:58

## 2014-05-23 NOTE — Discharge Summary (Signed)
PATIENT NAME:  Lisa Fuentes, Lisa Fuentes MR#:  [id number] DATE OF BIRTH:  10-13-1960  DATE OF ADMISSION:  07/22/2011 DATE OF DISCHARGE:  07/22/2011  ADMISSION DIAGNOSES:  1. Chest pressure.  2. Sinus tachycardia.   DISCHARGE DIAGNOSES: 1. Chest pressure secondary sinus tachycardia. 2. Sinus tachycardia secondary to known hyperthyroidism.  3. Hyperthyroidism noncompliant with medications/Graves' disease.  4. History of cardiomyopathy, ejection fraction of 25%, status post automatic implantable cardiac defibrillator.  5. History of coronary artery disease.  6. Obstructive sleep apnea, not complaint with continuous positive airway pressure.   7. Anemia of chronic disease.   CONSULT: Dr. Ubaldo Glassing.   LABORATORY, DIAGNOSTIC, AND RADIOLOGICAL DATA: Troponin x3 were negative. TSH less than 0.010.   HOSPITAL COURSE: 54 year old female who presented with sinus tachycardia and chest pressure. For further details, please refer to the history and physical.  1. Chest pressure and sinus tachycardia. Chest pressure was secondary to sinus tachycardia. Her troponins were negative. Her echo was done two weeks ago in the office. Dr. Ubaldo Glassing will review this and recommended follow up Roderic Palau, PA next week. She had a catheterization in 2003, however, her troponins are negative and once again the chest pressure was thought to be due to sinus tachycardia.  2. Sinus tachycardia secondary to Graves' disease and noncompliant with her thyroid medication.  3. Hypothyroidism/Graves' disease. Patient with a recent diagnosis. She was not compliant with her methimazole. She has a prescription at [facility] which she will pick up and I recommended that she follow up with Dr. Rosario Jacks.  4. Nonischemic cardiomyopathy status post automatic implantable cardiac defibrillator. Her ejection fraction was 25%. She will continue her current medications. Follow up with cardiology as mentioned, Roderic Palau, PA.  5. Obstructive sleep apnea, not  compliant with CPAP. I discussed with the patient that she needs to use this. 6. Hypertension. Patient will continue her outpatient medications including Coreg.   DISCHARGE MEDICATIONS:  1. Pantoprazole 40 mg daily.  2. Digoxin 0.5 mg daily.  3. Citalopram 1 tablet daily.  4. Spironolactone 12.5 mg daily.  5. Lasix 40 mg daily.  6. Altace 10 mg daily.  7. Aspirin 81 mg daily.  8. Methimazole 5 mg q.8 hours. 9. Coreg 25 mg daily.   DISCHARGE DIET: Low sodium.   DISCHARGE ACTIVITY: No exertion or heavy lifting.   DISCHARGE FOLLOW UP: Patient will need to follow up with Roderic Palau, PA and dr. Rosario Jacks as well as Dr. Maryland Pink in one week.   TIME SPENT: Approximately 35 minutes.  ____________________________ Donell Beers. Benjie Karvonen, MD spm:cms D: 07/22/2011 13:42:01 ET T: 07/22/2011 15:37:50 ET JOB#: [id number]  cc: Eileene Kisling P. Benjie Karvonen, MD, <Dictator> Moses Manners Kayleen Memos, NP Isla Pence, MD Irven Easterly. Kary Kos, MD Donell Beers Hailey Stormer MD ELECTRONICALLY SIGNED 07/23/2011 13:14

## 2014-05-23 NOTE — H&P (Signed)
PATIENT NAME:  Lisa Fuentes, Lisa Fuentes MR#:  [id number] DATE OF BIRTH:  [birth year]  DATE OF ADMISSION:  07/22/2011  PRIMARY CARE PHYSICIAN: Dr. Kary Kos  ER PHYSICIAN: Dr. Owens Shark ADMITTING PHYSICIAN: Dr. Pearletha Furl  PRESENTING COMPLAINT: Chest pain.   HISTORY OF PRESENT ILLNESS: Patient is a 54 year old lady who was recently diagnosed with Graves' disease and was in her usual state of health until this evening, started having some palpitations. This was soon followed by chest pain. Pain was sharp in nature, left sided, radiating up to the neck. Denies any nausea, vomiting, diarrhea, dizziness. No loss of consciousness. No fever, history of trauma. No PND, orthopnea, or pedal edema. Pain is 8/10 in intensity. For this she took aspirin and nitroglycerin and presented to the Emergency Room. At time of arrival here pain has resolved. She was noted to have a heart rate around 20 on arrival with blood pressure 103/52, respiratory rate 18. For this she was referred to hospitalist for further evaluation. Work-up so far has been negative with troponin 0.03 which was negative. D-dimer was negative. BNP was 387. She was noted to have sinus tachycardia on EKG with inferolateral T wave inversions which are unchanged from previous one. Of note, patient admits poor compliance to her medication, is yet to start on antithyroid medication which was recommended and poorly compliant with her Coreg and digoxin which she has at home.   REVIEW OF SYSTEMS: CONSTITUTIONAL: Denies fever, fatigue, weakness, weight loss or weight gain. EYES: No blurred vision, dizziness, redness or discharge. ENT: No tinnitus, epistaxis, redness of pharynx or difficulty swallowing. RESPIRATORY: Denies any cough, wheezing. No painful respiration. CARDIOVASCULAR: Positive for chest pain but no orthopnea, pedal edema but admits to palpitations. No syncopal episodes. GASTROINTESTINAL: No nausea, vomiting, diarrhea. Has abdominal pain which is epigastric in  location, colicky in nature, worsened by feeds. No change in bowel habits. GENITOURINARY: No dysuria, frequency, incontinence. ENDOCRINE: No polyuria, polydipsia, heat or cold intolerance, excessive thirst. HEMATOLOGIC: No anemia, easy bruising, bleeding, or swollen glands. SKIN: No rashes, change in hair or skin texture. MUSCULOSKELETAL: No joint pain, swelling, redness, limited activity, or gout. NEUROLOGICAL: No numbness, vertigo, migraine, transient ischemic attack, memory loss. PSYCH: No anxiety, depression.   PAST MEDICAL HISTORY:  1. Recently diagnosed with Graves' disease, has yet to start on antithyroid medication.  2. History of hypertension.  3. History of cardiomyopathy with ejection fraction of 25% status post automatic implantable cardiac defibrillator placement. 4. History of coronary artery disease. 5. Depression. 6. Obstructive sleep apnea, poorly noncompliant with CPAP. 7. Degenerative disk disease. 8. Back pain. 9. Gastroesophageal reflux disease. 10. Anemia. 11. Migraine headache. 12. Prior history of apical thrombosis status post Coumadin for six months.   PAST SURGICAL HISTORY:  1. Hysterectomy 2006.  2. Automatic implantable 2008.  3. Appendectomy.  4. Hemorrhoidectomy.  5. Tonsillectomy. 6. Cardiac catheterization 2005.   SOCIAL HISTORY: Works as a Quarry manager. No alcohol, tobacco, or recreational drug use.   FAMILY HISTORY: Positive for hypertension in the mother and the siblings. Lung cancer, colon cancer in first degree relatives and type 2 diabetes.   ALLERGIES: Septra which gives her hives.   MEDICATIONS:  1. Altace 10 mg daily.  2. Aspirin 81 mg daily.  3. Citalopram does not know the dose which she is supposed to take daily.  4. Coreg 25 mg once a day. 5. Digoxin 500 mcg daily.  6. Lasix 40 mg daily.  7. Klor-Con 10, 1 tablet daily.  8. Protonix 40 mg daily.  9. Spironolactone she takes once a day.  10. She also is supposed to be on methimazole which was  called in by her primary care physician to the pharmacy, is yet to pick it up.   PHYSICAL EXAMINATION:  VITAL SIGNS: Blood pressure on arrival 102/52, heart rate 114, respiratory rate 18, oxygen saturation 98% on room air. Currently heart rate is 106.    GENERAL: Young lady lying on the gurney awake, alert, oriented to time, place, and person, in no overt distress.   HEENT: Atraumatic, normocephalic. Pupils equal, reactive to light and accommodation. Extraocular movement intact. Mucous membranes pink, moist.   NECK: Supple. No JV distention.   CHEST: Good air entry. Few transmitted breath sounds. No rhonchi, no rales.   HEART: Regular rate and regular rhythm but tachycardic. No murmurs.   ABDOMEN: Full, moves with respiration. Epigastric tenderness. Bowel sounds normoactive. No organomegaly.   EXTREMITIES: Trace edema of bilateral lower extremity 1+.   NEUROLOGICAL: No focal motor or sensory deficits.   PSYCH: Affect appropriate to situation.   LABORATORY, DIAGNOSTIC, AND RADIOLOGICAL DATA: EKG shows sinus tachycardia at rate of 116, Q wave inversion in inferolateral leads. Chest x-ray showed no obvious infiltrates. CBC white count 5, hemoglobin 11, platelets 118. No prior labs in system for comparison. Chemistry unremarkable. Creatinine 0.8, BUN 29, potassium 4.8, calcium 10, glucose 94. BNP 387. Normal LFTs. CK 72 with MB fraction 2.4. Troponin is negative. D-dimer is negative, 0.4. Thyroid scan shows no hot or cold nodule but showed increased uptake suggestive of hyperthyroidism.    IMPRESSION:  1. Chest pain, rule out ACS versus arrhythmia versus gastritis.  2. Sinus tachycardia, most likely secondary to hyperthyroidism, rule out ACS.  3. Hypertension, stable.  4. Depression, stable.  5. History of cardiomyopathy status post automatic implantable cardiac defibrillator, stable.  6. Gastroesophageal reflux disease, stable.  7. Obstructive sleep apnea. Poorly compliant with CPAP  machine.  8. Thrombocytopenia, not otherwise specified.   PLAN:  1. Admit to general medical floor under observation for telemonitoring, echocardiogram. Check cardiac enzymes, TSH, magnesium, fasting lipid profile. Metoprolol 2.5 mg IV x1. Resume outpatient medications. Check digoxin level. start methimazole. Continue p.o. diuresis. Outpatient medications. CPAP at night. Compliance to medication and therapy advised. Aspirin, nitroglycerin, p.r.n. morphine. Rule patient out by enzymes.  2. GI prophylaxis with Protonix. Deep vein thrombosis prophylaxis with Lovenox.  3. Cardiology evaluation in a.m.  4. CODE STATUS: FULL CODE.   TOTAL PATIENT CARE TIME: 50 minutes.   ____________________________ Jules Husbands Pearletha Furl, MD mia:cms D: 07/22/2011 02:16:43 ET T: 07/22/2011 09:56:42 ET JOB#: [id number]  cc: Chelcie Estorga I. Pearletha Furl, MD, <Dictator> Irven Easterly. Kary Kos, MD Carola Frost MD ELECTRONICALLY SIGNED 07/24/2011 0:30

## 2014-05-23 NOTE — Consult Note (Signed)
    General Aspect 54 yo female with history of reported cardiomyopathy followed by Dr. Nehemiah Massed with a defibrillator in place. She was recently diagnosed with Graves Disease but she has not started with methimazole. She also states she has not been compliant with lasix but states she has taken her other meds. She was admitted with tachycardia. She is stable and does not have evidence of chf or ischemia. She states she is at her baseline. She states she had an echo two weks ago in [facility]   Physical Exam:   GEN well developed, well nourished, no acute distress    HEENT PERRL    NECK No masses    RESP clear BS    CARD Regular rate and rhythm  Tachycardic    ABD denies tenderness  normal BS    LYMPH negative neck, negative axillae    EXTR negative cyanosis/clubbing    SKIN normal to palpation    NEURO cranial nerves intact, motor/sensory function intact    PSYCH A+O to time, place, person   Review of Systems:   General: Fatigue    Skin: No Complaints    ENT: No Complaints    Eyes: No Complaints    Neck: No Complaints    Respiratory: No Complaints    Cardiovascular: Palpitations    Gastrointestinal: No Complaints    Genitourinary: No Complaints    Vascular: No Complaints    Musculoskeletal: No Complaints    Neurologic: No Complaints    Hematologic: No Complaints    Endocrine: No Complaints    Psychiatric: No Complaints    Review of Systems: All other systems were reviewed and found to be negative    Medications/Allergies Reviewed Medications/Allergies reviewed     Hyperthyroid:    Depression:    Hypertension:    heart disease:    Defibrillator Implant/Medtronic Vertuoso VR: Apr 2008   Hysterectomy - Partial:   EKG:   Interpretation tachycardia    Septra: Hives, Rash    Impression Pt with hisory of cardiomyopathy treated medically and with a acid in place. SHe was admitted with tachycardia. Was recently diagnosied with graves  disease and has not yet started on methimazole. She is not in chf. She had an echo in [facility] 2 weeks ago per the patient. She is stable at present and appears to be at her baseline    Plan 1. Continue with carvedilol, ace i, spironolactone and prn lasix 2. Start on methimazole as orderred 3. Ambualte and consider discharge today with follow up with Dr. Harolyn Rutherford   Electronic Signatures: Teodoro Spray (MD)  (Signed 23-Jun-13 20:44)  Authored: General Aspect/Present Illness, History and Physical Exam, Review of System, Past Medical History, EKG , Allergies, Impression/Plan   Last Updated: 23-Jun-13 20:44 by Teodoro Spray (MD)

## 2014-05-24 ENCOUNTER — Ambulatory Visit: Payer: Medicare Other | Admitting: Cardiovascular Disease

## 2014-06-11 ENCOUNTER — Ambulatory Visit (INDEPENDENT_AMBULATORY_CARE_PROVIDER_SITE_OTHER): Payer: Medicare Other | Admitting: Cardiovascular Disease

## 2014-06-11 ENCOUNTER — Encounter: Payer: Self-pay | Admitting: Cardiovascular Disease

## 2014-06-11 VITALS — BP 108/70 | HR 85 | Ht 67.0 in | Wt 240.5 lb

## 2014-06-11 DIAGNOSIS — R079 Chest pain, unspecified: Secondary | ICD-10-CM | POA: Diagnosis not present

## 2014-06-11 DIAGNOSIS — I10 Essential (primary) hypertension: Secondary | ICD-10-CM

## 2014-06-11 DIAGNOSIS — Z95810 Presence of automatic (implantable) cardiac defibrillator: Secondary | ICD-10-CM | POA: Diagnosis not present

## 2014-06-11 DIAGNOSIS — I5022 Chronic systolic (congestive) heart failure: Secondary | ICD-10-CM

## 2014-06-11 NOTE — Progress Notes (Signed)
Primary care physician: Dr. Rosario Jacks   HPI  This is a 54 year old African-American female who is here today for a follow-up visit regarding chronic systolic heart failure due to nonischemic cardiomyopathy. She has known history of chronic systolic heart failure diagnosed in early 2000 due to nonischemic cardiomyopathy. She had cardiac catheterization done in 2003 which showed no significant coronary artery disease. She had an ICD placement in 2008 with generator replacement in May 2015 done by Mylinda Latina. She has known history of sleep apnea, hypertension and hyperthyroidism. She received radioactive iodine treatment and currently is on thyroid replacement therapy. She had a nuclear stress test done in March 2014 which showed a fixed anterior apical defect with ejection fraction of 57%.  She has chronic exertional dyspnea with mild to moderate activities. No orthopnea, PND or lower extremity edema.  Most recent echocardiogram in January 2016 showed an ejection fraction of 40-45%. She was taken off digoxin recently.   Allergies  Allergen Reactions  . Sulfamethoxazole-Trimethoprim     Other reaction(s): Unknown     Current Outpatient Prescriptions on File Prior to Visit  Medication Sig Dispense Refill  . carvedilol (COREG) 25 MG tablet Take 1/2 tablet (12.5 mg) by mouth twice daily    . citalopram (CELEXA) 40 MG tablet TAKE ONE TABLET BY MOUTH ONCE DAILY    . furosemide (LASIX) 40 MG tablet TAKE ONE TABLET BY MOUTH ONCE DAILY *PATIENT OCCASIONALLY TAKES AN EXTRA TABLET*    . levothyroxine (SYNTHROID, LEVOTHROID) 125 MCG tablet Take 125 mcg by mouth daily before breakfast.    . lisinopril (PRINIVIL,ZESTRIL) 5 MG tablet Take 1 tablet (5 mg total) by mouth daily. 30 tablet 6  . pantoprazole (PROTONIX) 40 MG tablet Take 40 mg by mouth daily.    Marland Kitchen spironolactone (ALDACTONE) 25 MG tablet TAKE ONE-HALF TABLET BY MOUTH ONCE DAILY     No current facility-administered medications on file prior to  visit.     Past Medical History  Diagnosis Date  . Thyroid disease   . Menopausal symptoms   . Anemia   . Tachycardia   . Hypertension   . Hypokalemia   . Clotting disorder   . Nonischemic cardiomyopathy     Interval improvement EF 40-45% 1/16  . Sleep apnea     Treated with CPAP  . Implantable defibrillator     Medtronic DOI 2008, replacement 2015  . Hypercholesteremia      Past Surgical History  Procedure Laterality Date  . Cardiac defibrillator placement    . Total abdominal hysterectomy    . Cardiac catheterization  2003    [facility]: No significant coronary artery disease with reduced ejection fraction.  . Tonsillectomy    . Foot surgery    . Colonoscopy  2015     Family History  Problem Relation Age of Onset  . Hypertension Mother   . Hyperlipidemia Mother   . Stroke Mother   . Colon cancer Mother      History   Social History  . Marital Status: Divorced    Spouse Name: N/A  . Number of Children: N/A  . Years of Education: N/A   Occupational History  . Not on file.   Social History Main Topics  . Smoking status: Never Smoker   . Smokeless tobacco: Not on file  . Alcohol Use: 0.0 oz/week    0 Standard drinks or equivalent per week  . Drug Use: No  . Sexual Activity: Not on file   Other Topics Concern  .  Not on file   Social History Narrative     ROS A 10 point review of system was performed. It is negative other than that mentioned in the history of present illness.   PHYSICAL EXAM   BP 108/70 mmHg  Pulse 85  Ht 5\' 7"  (1.702 m)  Wt 240 lb 8 oz (109.09 kg)  BMI 37.66 kg/m2 Constitutional: She is oriented to person, place, and time. She appears well-developed and well-nourished. No distress.  HENT: No nasal discharge.  Head: Normocephalic and atraumatic.  Eyes: Pupils are equal and round. No discharge.  Neck: Normal range of motion. Neck supple. No JVD present. No thyromegaly present.  Cardiovascular: Normal rate, regular rhythm,  normal heart sounds. Exam reveals no gallop and no friction rub. No murmur heard.  Pulmonary/Chest: Effort normal and breath sounds normal. No stridor. No respiratory distress. She has no wheezes. She has no rales. She exhibits no tenderness.  Abdominal: Soft. Bowel sounds are normal. She exhibits no distension. There is no tenderness. There is no rebound and no guarding.  Musculoskeletal: Normal range of motion. She exhibits no edema and no tenderness.  Neurological: She is alert and oriented to person, place, and time. Coordination normal.  Skin: Skin is warm and dry. No rash noted. She is not diaphoretic. No erythema. No pallor.  Psychiatric: She has a normal mood and affect. Her behavior is normal. Judgment and thought content normal.     YFR:TMYTR  Rhythm  Low voltage in precordial leads.   -  Nonspecific T-abnormality.   ABNORMAL     ASSESSMENT AND PLAN

## 2014-06-11 NOTE — Patient Instructions (Signed)
Continue same medications.   Your physician wants you to follow-up in: 6 months.  You will receive a reminder letter in the mail two months in advance. If you don't receive a letter, please call our office to schedule the follow-up appointment.  

## 2014-06-11 NOTE — Assessment & Plan Note (Signed)
She is doing reasonably well and currently New York Heart Association class II. Most recent ejection fraction was 40-45%. She is on optimal medical therapy. Basic metabolic profile in February was unremarkable.

## 2014-06-11 NOTE — Assessment & Plan Note (Signed)
This is being followed by Dr. Caryl Comes.

## 2014-06-11 NOTE — Assessment & Plan Note (Signed)
Blood pressure is well controlled on current medications. 

## 2014-06-22 ENCOUNTER — Ambulatory Visit: Payer: Self-pay | Admitting: General Surgery

## 2014-07-06 ENCOUNTER — Telehealth: Payer: Self-pay | Admitting: Cardiology

## 2014-07-06 ENCOUNTER — Encounter: Payer: Medicare Other | Admitting: *Deleted

## 2014-07-06 NOTE — Telephone Encounter (Signed)
LMOVM reminding pt to send remote transmission.   

## 2014-07-07 ENCOUNTER — Encounter: Payer: Self-pay | Admitting: Cardiology

## 2014-07-15 ENCOUNTER — Encounter: Payer: Self-pay | Admitting: *Deleted

## 2014-08-16 ENCOUNTER — Ambulatory Visit (INDEPENDENT_AMBULATORY_CARE_PROVIDER_SITE_OTHER): Payer: Medicare Other | Admitting: General Surgery

## 2014-08-16 ENCOUNTER — Other Ambulatory Visit: Payer: Medicare Other

## 2014-08-16 ENCOUNTER — Encounter: Payer: Self-pay | Admitting: General Surgery

## 2014-08-16 VITALS — BP 132/78 | HR 76 | Resp 14 | Ht 67.0 in | Wt 241.0 lb

## 2014-08-16 DIAGNOSIS — N6320 Unspecified lump in the left breast, unspecified quadrant: Secondary | ICD-10-CM

## 2014-08-16 DIAGNOSIS — N63 Unspecified lump in breast: Secondary | ICD-10-CM | POA: Diagnosis not present

## 2014-08-16 NOTE — Patient Instructions (Signed)
  Patient to return bilateral screening mammogram February 2017.

## 2014-08-16 NOTE — Progress Notes (Signed)
Patient ID: Lisa Fuentes, female   DOB: [birth year], 54 y.o.   MRN: [id number]  Chief Complaint  Patient presents with  . Follow-up    left breast mass    HPI Lisa Fuentes is a 54 y.o. female here today following up for a left breast atypical cyst aspirated 4 mos ago. Patient states she does not feel the area no more.  FNA was done in February 2016 with negative reasults. She has not noticed any new breast problems.  HPI  Past Medical History  Diagnosis Date  . Thyroid disease   . Menopausal symptoms   . Anemia   . Tachycardia   . Hypertension   . Hypokalemia   . Clotting disorder   . Nonischemic cardiomyopathy     Interval improvement EF 40-45% 1/16  . Sleep apnea     Treated with CPAP  . Implantable defibrillator     Medtronic DOI 2008, replacement 2015  . Hypercholesteremia     Past Surgical History  Procedure Laterality Date  . Cardiac defibrillator placement    . Total abdominal hysterectomy    . Cardiac catheterization  2003    [facility]: No significant coronary artery disease with reduced ejection fraction.  . Tonsillectomy    . Foot surgery    . Colonoscopy  2015    Family History  Problem Relation Age of Onset  . Hypertension Mother   . Hyperlipidemia Mother   . Stroke Mother   . Colon cancer Mother     Social History History  Substance Use Topics  . Smoking status: Never Smoker   . Smokeless tobacco: Not on file  . Alcohol Use: 0.0 oz/week    0 Standard drinks or equivalent per week    Allergies  Allergen Reactions  . Sulfamethoxazole-Trimethoprim     Other reaction(s): Unknown    Current Outpatient Prescriptions  Medication Sig Dispense Refill  . aspirin 81 MG tablet Take 81 mg by mouth daily.    . carvedilol (COREG) 25 MG tablet Take 1/2 tablet (12.5 mg) by mouth twice daily    . citalopram (CELEXA) 40 MG tablet TAKE ONE TABLET BY MOUTH ONCE DAILY    . furosemide (LASIX) 40 MG tablet TAKE ONE TABLET BY MOUTH ONCE DAILY *PATIENT  OCCASIONALLY TAKES AN EXTRA TABLET*    . levothyroxine (SYNTHROID, LEVOTHROID) 125 MCG tablet Take 125 mcg by mouth daily before breakfast.    . lisinopril (PRINIVIL,ZESTRIL) 5 MG tablet Take 1 tablet (5 mg total) by mouth daily. 30 tablet 6  . pantoprazole (PROTONIX) 40 MG tablet Take 40 mg by mouth daily.    Marland Kitchen spironolactone (ALDACTONE) 25 MG tablet TAKE ONE-HALF TABLET BY MOUTH ONCE DAILY     No current facility-administered medications for this visit.    Review of Systems Review of Systems  Constitutional: Negative.   Respiratory: Negative.   Cardiovascular: Negative.     Blood pressure 132/78, pulse 76, resp. rate 14, height 5\' 7"  (1.702 m), weight 241 lb (109.317 kg).  Physical Exam Physical Exam  Constitutional: She is oriented to person, place, and time. She appears well-developed and well-nourished.  Eyes: Conjunctivae are normal. No scleral icterus.  Neck: Neck supple.  Cardiovascular: Normal rate, regular rhythm and normal heart sounds.   Pulmonary/Chest: Effort normal and breath sounds normal. Right breast exhibits no inverted nipple, no mass, no nipple discharge, no skin change and no tenderness. Left breast exhibits no inverted nipple, no mass, no nipple discharge, no skin change and no  tenderness.  Lymphadenopathy:    She has no cervical adenopathy.  Neurological: She is alert and oriented to person, place, and time.  Skin: Skin is warm and dry.    Data Reviewed Prior notes reviewed  Targeted ultrasound on left breast shows no recurrecnce of previously aspirated cyst and no new findings.  Assessment    Breast cyst resolved.     Plan    Patient to return bilateral screening mammogram February 2017.     PCP:  Dr Vidal Schwalbe 08/16/2014, 1:13 PM

## 2014-09-06 ENCOUNTER — Telehealth: Payer: Self-pay | Admitting: Obstetrics and Gynecology

## 2014-09-06 NOTE — Telephone Encounter (Signed)
Pt called and she would like a call back she needed to ask something, pt would not tell me what.

## 2014-09-08 NOTE — Telephone Encounter (Signed)
Pt has appt on 09/17/14 for referral from Dr Darcel Bayley (cyst and pain per pt) and wanted to know if she could call her something in for pain. Pt has not been seen in office since 10/20/1013 for post-op: Laparoscopic bil. Salpingectomy, right oophorectomy. Was to f/u in 3 months-no follow up. Advised pt that she may try MD that saw her as Dr. Marcelline Mates has not seen her in a almost a year.

## 2014-09-17 ENCOUNTER — Encounter: Payer: Self-pay | Admitting: Obstetrics and Gynecology

## 2014-09-17 ENCOUNTER — Ambulatory Visit (INDEPENDENT_AMBULATORY_CARE_PROVIDER_SITE_OTHER): Payer: Medicare Other | Admitting: Obstetrics and Gynecology

## 2014-09-17 VITALS — BP 103/70 | HR 74 | Ht 67.0 in | Wt 245.4 lb

## 2014-09-17 DIAGNOSIS — N8320 Unspecified ovarian cysts: Secondary | ICD-10-CM | POA: Diagnosis not present

## 2014-09-17 DIAGNOSIS — N809 Endometriosis, unspecified: Secondary | ICD-10-CM | POA: Diagnosis not present

## 2014-09-17 DIAGNOSIS — R102 Pelvic and perineal pain: Secondary | ICD-10-CM

## 2014-09-17 DIAGNOSIS — N83202 Unspecified ovarian cyst, left side: Secondary | ICD-10-CM

## 2014-09-17 MED ORDER — ACETAMINOPHEN-CODEINE #3 300-30 MG PO TABS: 1.0000 | ORAL_TABLET | ORAL | Status: DC | PRN

## 2014-09-17 MED ORDER — NORETHINDRONE 0.35 MG PO TABS
1.0000 | ORAL_TABLET | Freq: Every day | ORAL | Status: DC
Start: 2014-09-17 — End: 2014-11-12

## 2014-09-17 NOTE — Progress Notes (Signed)
GYNECOLOGY PROGRESS NOTE  Subjective:    Patient ID: Lisa Fuentes, female    DOB: [birth year], 54 y.o.   MRN: [id number]  HPI  Patient is a 54 y.o. G53P3003 female who presents as a referral from Dr. Casilda Carls for abnormal pelvic ultrasound.  Patient reports that she has been experiencing lower abdominal/pelvic pain (mostly right sided) for several months.  Was just dealing with the pain, however as pain worsened, she sought care with PCP.  Had ultrasound performed which noted a cervical cyst and left ovarian cyst.  Patient has had a h/o of ovarian cysts in the past, with removal of right ovary (and bilateral fallopian tubes) last year.    Past Medical History  Diagnosis Date  . Thyroid disease   . Menopausal symptoms   . Anemia   . Tachycardia   . Hypertension   . Hypokalemia   . Clotting disorder   . Nonischemic cardiomyopathy     Interval improvement EF 40-45% 1/16  . Sleep apnea     Treated with CPAP  . Implantable defibrillator     Medtronic DOI 2008, replacement 2015  . Hypercholesteremia   . Endometriosis of vagina 09/2013    Intra-operative findings of endometriosis implants on cervical stump    Past Surgical History  Procedure Laterality Date  . Cardiac defibrillator placement    . Total abdominal hysterectomy    . Cardiac catheterization  2003    [facility]: No significant coronary artery disease with reduced ejection fraction.  . Tonsillectomy    . Foot surgery    . Colonoscopy  2015  . Tubal ligation Bilateral   . Laparoscopic salpingoopherectomy Right 09/2013    with left salpingectomy     Review of Systems Pertinent items are noted in HPI.     Objective:   Blood pressure 103/70, pulse 74, height 5\' 7"  (1.702 m), weight 245 lb 6.4 oz (111.313 kg). General appearance: alert and no distress Abdomen: normal findings: bowel sounds normal and soft and abnormal findings:  mild tenderness in the lower abdomen Pelvic: cervix normal in appearance, external  genitalia normal, no adnexal masses or tenderness, no cervical motion tenderness, rectovaginal septum normal and vagina normal without discharge. Uterus and right ovary surgically absent. Extremities: extremities normal, atraumatic, no cyanosis or edema Neurologic: Grossly normal   Imaging:  Ultrasound 08/26/2014:  1) Uterus and right ovary surgically absent.  2) Large complex cystic lesion, 2.8 x 2.4 x 2.1 cm visualized in the remnant of cervix vs.vaginal cuff (unclear of how much cervic remains).  It's exact location (wall vs canal) is unclear.  Considerations include complex nabothian cyst; malignancy not excluded.  Could consider MRI of the pelvis for further evaluation if deemed necessary by clinical exam and history.  3) Left ovary normal size, with simple cyst 2.4 cm.   Assessment:   1. Pelvic pain 2. Vaginal vs cervical stump complex cyst 3. Left ovarian simple cyst 4. H/o endometriosis of vaginal cuff/cervical stump (seen on last operative laparoscopy in 2015)  Plan:   1. Pelvic pain and vaginal cuff/cervical stump cyst - likely secondary to endometriosis flare (h/o endometrial implants on laparoscopy).  Will prescribe round of progesterone OCPs x 6 weeks and repeat ultrasound.  If still no relief of symptoms and cyst, may consider laparoscopy with excision of cyst and fulguration of endometrial implants.  2. Refill given on pain medications.  3. Simple cyst of left ovary. Will f/u with next ultrasound.  Likely will respond to hormonal therapy.  RTC in 6 weeks.    Rubie Maid, MD [facility]

## 2014-09-17 NOTE — Progress Notes (Signed)
Patient ID: Lisa Fuentes, female   DOB: 06-12-1960, 54 y.o.   MRN: [id number] Refer from Dr Rosario Jacks Abnormal u/s per pt having pelvic pain- cyst on ovary and on cervix per pt

## 2014-09-18 DIAGNOSIS — N809 Endometriosis, unspecified: Secondary | ICD-10-CM | POA: Insufficient documentation

## 2014-09-23 ENCOUNTER — Telehealth: Payer: Self-pay | Admitting: *Deleted

## 2014-09-23 NOTE — Telephone Encounter (Signed)
Pt calling stating she was driving yesterday to [facility] [address] she was very weak, and felt funny in her chest. It was bothering her. Not sure if it was driving. Would like to see [place] but our closet sept 22 Her friend is tell her to get checked out but she keeps ignoring it.  Please advise.  This is not a constant issues.  Only happens when driving.

## 2014-09-24 NOTE — Telephone Encounter (Signed)
S/w pt who states two weeks ago she felt left-sided numbness and pain. Lasted three days. Resolved on its own. She was driving to [facility] Wednesday evening when she felt a funny feeling in her chest, right hand numbness. Lasted 20 minutes. States she has been dizzy recently. Has been taking medications as prescribed; no new medications added.  Informed pt that I will forward info to Dr. Fletcher Anon however, if she experiences symptoms again, she should go to ER to be seen. Pt verbalized understanding with no further questions.

## 2014-09-27 ENCOUNTER — Encounter: Payer: Self-pay | Admitting: Cardiovascular Disease

## 2014-09-27 ENCOUNTER — Ambulatory Visit (INDEPENDENT_AMBULATORY_CARE_PROVIDER_SITE_OTHER): Payer: Medicare Other | Admitting: Cardiovascular Disease

## 2014-09-27 VITALS — BP 100/68 | HR 74 | Ht 66.5 in | Wt 243.5 lb

## 2014-09-27 DIAGNOSIS — I10 Essential (primary) hypertension: Secondary | ICD-10-CM

## 2014-09-27 DIAGNOSIS — R0602 Shortness of breath: Secondary | ICD-10-CM

## 2014-09-27 DIAGNOSIS — I5022 Chronic systolic (congestive) heart failure: Secondary | ICD-10-CM | POA: Diagnosis not present

## 2014-09-27 DIAGNOSIS — R079 Chest pain, unspecified: Secondary | ICD-10-CM | POA: Diagnosis not present

## 2014-09-27 NOTE — Telephone Encounter (Signed)
Left message on pt home and cell VM to call back.

## 2014-09-27 NOTE — Telephone Encounter (Signed)
She should be seen this week. If I open my clinic tomorrow afternoon, I can see her then. Otherwise, should bring to see Ryan.

## 2014-09-27 NOTE — Progress Notes (Signed)
Primary care physician: Dr. Rosario Jacks   HPI  This is a 54 year old African-American female who is here today for a follow-up visit regarding chronic systolic heart failure due to nonischemic cardiomyopathy. She has known history of chronic systolic heart failure diagnosed in early 2000 due to nonischemic cardiomyopathy. She had cardiac catheterization done in 2003 which showed no significant coronary artery disease. She had an ICD placement in 2008 with generator replacement in May 2015 done by Mylinda Latina. She has known history of sleep apnea, hypertension and hyperthyroidism. She received radioactive iodine treatment and currently is on thyroid replacement therapy. She had a nuclear stress test done in March 2014 which showed a fixed anterior apical defect with ejection fraction of 57%.  Most recent echocardiogram in January 2016 showed an ejection fraction of 40-45%. She was taken off digoxin recently. She had a repeat nuclear stress test in January 2016 which showed no evidence of ischemia with normal ejection fraction. She was driving recently and had an episode of chest pain with some  numbness in her arm. She became concerned about this but symptoms resolved. She has no exertional symptoms of chest pain. She has stable chronic exertional dyspnea.   Allergies  Allergen Reactions  . Sulfamethoxazole-Trimethoprim     Other reaction(s): Unknown     Current Outpatient Prescriptions on File Prior to Visit  Medication Sig Dispense Refill  . acetaminophen-codeine (TYLENOL #3) 300-30 MG per tablet Take 1-2 tablets by mouth every 4 (four) hours as needed for moderate pain. 30 tablet 0  . aspirin 81 MG tablet Take 81 mg by mouth daily.    . carvedilol (COREG) 25 MG tablet Take 1/2 tablet (12.5 mg) by mouth twice daily    . citalopram (CELEXA) 40 MG tablet TAKE ONE TABLET BY MOUTH ONCE DAILY    . furosemide (LASIX) 40 MG tablet TAKE ONE TABLET BY MOUTH ONCE DAILY *PATIENT OCCASIONALLY TAKES AN  EXTRA TABLET*    . levothyroxine (SYNTHROID, LEVOTHROID) 125 MCG tablet Take 125 mcg by mouth daily before breakfast.    . lisinopril (PRINIVIL,ZESTRIL) 5 MG tablet Take 1 tablet (5 mg total) by mouth daily. 30 tablet 6  . norethindrone (MICRONOR,CAMILA,ERRIN) 0.35 MG tablet Take 1 tablet (0.35 mg total) by mouth daily. Take active pills only x 6 weeks (discard placebo pills). 1 Package 1  . pantoprazole (PROTONIX) 40 MG tablet Take 40 mg by mouth daily.    Marland Kitchen spironolactone (ALDACTONE) 25 MG tablet TAKE ONE-HALF TABLET BY MOUTH ONCE DAILY     No current facility-administered medications on file prior to visit.     Past Medical History  Diagnosis Date  . Thyroid disease   . Menopausal symptoms   . Anemia   . Tachycardia   . Hypertension   . Hypokalemia   . Clotting disorder   . Nonischemic cardiomyopathy     Interval improvement EF 40-45% 1/16  . Sleep apnea     Treated with CPAP  . Implantable defibrillator     Medtronic DOI 2008, replacement 2015  . Hypercholesteremia   . Endometriosis of vagina 09/2013    Intra-operative findings of endometriosis implants on cervical stump     Past Surgical History  Procedure Laterality Date  . Cardiac defibrillator placement    . Total abdominal hysterectomy    . Cardiac catheterization  2003    [facility]: No significant coronary artery disease with reduced ejection fraction.  . Tonsillectomy    . Foot surgery    . Colonoscopy  2015  .  Tubal ligation Bilateral   . Laparoscopic salpingoopherectomy Right 09/2013    with left salpingectomy     Family History  Problem Relation Age of Onset  . Hypertension Mother   . Hyperlipidemia Mother   . Stroke Mother   . Colon cancer Mother      Social History   Social History  . Marital Status: Divorced    Spouse Name: N/A  . Number of Children: N/A  . Years of Education: N/A   Occupational History  . Not on file.   Social History Main Topics  . Smoking status: Never Smoker   .  Smokeless tobacco: Not on file  . Alcohol Use: 0.0 oz/week    0 Standard drinks or equivalent per week     Comment: 5 q week  . Drug Use: No  . Sexual Activity: Yes   Other Topics Concern  . Not on file   Social History Narrative     ROS A 10 point review of system was performed. It is negative other than that mentioned in the history of present illness.   PHYSICAL EXAM   BP 100/68 mmHg  Pulse 74  Ht 5' 6.5" (1.689 m)  Wt 243 lb 8 oz (110.451 kg)  BMI 38.72 kg/m2 Constitutional: She is oriented to person, place, and time. She appears well-developed and well-nourished. No distress.  HENT: No nasal discharge.  Head: Normocephalic and atraumatic.  Eyes: Pupils are equal and round. No discharge.  Neck: Normal range of motion. Neck supple. No JVD present. No thyromegaly present.  Cardiovascular: Normal rate, regular rhythm, normal heart sounds. Exam reveals no gallop and no friction rub. No murmur heard.  Pulmonary/Chest: Effort normal and breath sounds normal. No stridor. No respiratory distress. She has no wheezes. She has no rales. She exhibits no tenderness.  Abdominal: Soft. Bowel sounds are normal. She exhibits no distension. There is no tenderness. There is no rebound and no guarding.  Musculoskeletal: Normal range of motion. She exhibits no edema and no tenderness.  Neurological: She is alert and oriented to person, place, and time. Coordination normal.  Skin: Skin is warm and dry. No rash noted. She is not diaphoretic. No erythema. No pallor.  Psychiatric: She has a normal mood and affect. Her behavior is normal. Judgment and thought content normal.     IRJ:JOACZ  Rhythm  WITHIN NORMAL LIMITS   ASSESSMENT AND PLAN

## 2014-09-27 NOTE — Telephone Encounter (Signed)
S/w pt who states she can be here at 2:15pm today to see Dr. Fletcher Anon.  Pt verbalized understanding

## 2014-09-27 NOTE — Patient Instructions (Signed)
Medication Instructions: Continue same medications.   Labwork: None.   Procedures/Testing: None.   Follow-Up: 6 months with Dr. Reyanne Hussar.   Any Additional Special Instructions Will Be Listed Below (If Applicable).   

## 2014-09-28 ENCOUNTER — Other Ambulatory Visit: Payer: Self-pay | Admitting: Cardiovascular Disease

## 2014-09-28 NOTE — Assessment & Plan Note (Addendum)
Recent symptoms of atypical chest pain and arm numbness. ECG is normal and cardiac workup early this year was unremarkable. Thus, I do not recommend further ischemic cardiac evaluation. She is known to have nonischemic cardiomyopathy. She is on optimal medical therapy.

## 2014-09-28 NOTE — Assessment & Plan Note (Signed)
Blood pressure is controlled on current medications. 

## 2014-10-08 ENCOUNTER — Telehealth: Payer: Self-pay | Admitting: Cardiovascular Disease

## 2014-10-08 ENCOUNTER — Other Ambulatory Visit: Payer: Self-pay | Admitting: *Deleted

## 2014-10-08 MED ORDER — SPIRONOLACTONE 25 MG PO TABS: 12.5000 mg | ORAL_TABLET | Freq: Every day | ORAL | Status: DC

## 2014-10-08 NOTE — Telephone Encounter (Signed)
°  1. Which medications need to be refilled? Aldactone 0.5 tab PO once daily   2. Which pharmacy is medication to be sent to? [facility]   3. Do they need a 30 day or 90 day supply? 90  4. Would they like a call back once the medication has been sent to the pharmacy? No , but only 1 pill left

## 2014-10-22 ENCOUNTER — Other Ambulatory Visit: Payer: Self-pay | Admitting: Orthopedic Surgery

## 2014-10-22 DIAGNOSIS — M25562 Pain in left knee: Secondary | ICD-10-CM

## 2014-10-22 DIAGNOSIS — M25569 Pain in unspecified knee: Secondary | ICD-10-CM | POA: Insufficient documentation

## 2014-10-28 ENCOUNTER — Ambulatory Visit
Admission: RE | Admit: 2014-10-28 | Discharge: 2014-10-28 | Disposition: A | Discharge status: Home / Self Care | Payer: Medicare Other | Source: Ambulatory Visit | Attending: Orthopedic Surgery | Admitting: Orthopedic Surgery

## 2014-10-28 DIAGNOSIS — M25562 Pain in left knee: Secondary | ICD-10-CM

## 2014-10-28 DIAGNOSIS — M1712 Unilateral primary osteoarthritis, left knee: Secondary | ICD-10-CM | POA: Diagnosis not present

## 2014-10-28 MED ORDER — IOHEXOL 300 MG/ML  SOLN: 5.0000 mL | Freq: Once | INTRAMUSCULAR | Status: DC | PRN

## 2014-10-29 ENCOUNTER — Ambulatory Visit: Payer: Medicare Other

## 2014-10-29 ENCOUNTER — Ambulatory Visit (INDEPENDENT_AMBULATORY_CARE_PROVIDER_SITE_OTHER): Payer: Medicare Other | Admitting: Obstetrics and Gynecology

## 2014-10-29 ENCOUNTER — Encounter: Payer: Self-pay | Admitting: Obstetrics and Gynecology

## 2014-10-29 VITALS — BP 116/79 | HR 81 | Ht 67.0 in | Wt 247.0 lb

## 2014-10-29 DIAGNOSIS — N809 Endometriosis, unspecified: Secondary | ICD-10-CM

## 2014-10-29 DIAGNOSIS — R102 Pelvic and perineal pain: Secondary | ICD-10-CM

## 2014-10-29 DIAGNOSIS — N83202 Unspecified ovarian cyst, left side: Secondary | ICD-10-CM

## 2014-10-29 DIAGNOSIS — N8320 Unspecified ovarian cysts: Secondary | ICD-10-CM

## 2014-10-29 DIAGNOSIS — I519 Heart disease, unspecified: Secondary | ICD-10-CM | POA: Insufficient documentation

## 2014-10-29 NOTE — Progress Notes (Signed)
GYNECOLOGY PROGRESS NOTE  Subjective:    Patient ID: AVIAH SORCI, female    DOB: Dec 13, 1960, 54 y.o.   MRN: [id number]  HPI  Patient is a 54 y.o. G4P0 female with h/o endometriosis who presents for f/u of right ovarian and cervical cyst, and for discussion after ultrasound.  Reports compliance with progesterone OCPs.  Notes that pelvic pain has decreased somewhat, but still present.    The following portions of the patient's history were reviewed and updated as appropriate: allergies, current medications, past family history, past medical history, past social history, past surgical history and problem list.  Review of Systems Pertinent items are noted in HPI.   Objective:   Blood pressure 116/79, pulse 81, height 5\' 7"  (1.702 m), weight 247 lb (112.038 kg). General appearance: alert and no distress Abdomen: soft, non-tender; bowel sounds normal; no masses,  no organomegaly Pelvic: deferred   Imaging:  Ultrasound 10/29/2014: The uterus is surgically absent. Cervical cyst contains internal echos and measures 2.2 x 1.9 x 2.0 cm.  Right Ovary is surgically absent. Left Ovary measures 3.1 x 2.5 x 2.2 cm. There is a simple appearing cyst which measures 1.6 x 1.9 x 1.9 cm.   Survey of the adnexa demonstrates no adnexal masses. There is a trace amount of free fluid in the cul de sac.  Impression: 1. There is a complex nabothian cyst at the cervix. 2. There is a simple appearing cyst in the left ovary. 3. Trace amount of free fluid in the cul de sac.   Ultrasound 08/26/2014:  1) Uterus and right ovary surgically absent.  2) Large complex cystic lesion, 2.8 x 2.4 x 2.1 cm visualized in the remnant of cervix vs.vaginal cuff (unclear of how much cervic remains). It's exact location (wall vs canal) is unclear. Considerations include complex nabothian cyst; malignancy not excluded. Could consider MRI of the pelvis for further evaluation if deemed necessary by clinical exam and  history.  3) Left ovary normal size, with simple cyst 2.4 cm.    Assessment:   H/o endometriosis (previously noted on cervical stump during laparoscopy last year) Left ovarian cyst, resolving Cervical cyst (likely nabothian)  Plan:   1. Discussion had with patient regarding ultrasound results.  Noted that left ovariany cyst and cervical cyst are decreasing in size.  Discussed continuation of OCPs for an additional 4-6 weeks.  Patient asks if it is possible that she may develop another cyst (notes that she just had surgery for a large cyst on right ovary last year.   After discussion of endometriosis and the possibility of future cysts or episodes of pelvic pain until onset of menopause, patient reports that she would now prefer surgical management. Noted that patient could use hormonal suppression (OCPs, Lupron) until menopause to manage symptoms.  Patient declines further medical management and wishes to proceed with surgical management.   2. Pre-op performed today.  Patient counseled on anticipated procedure of laparoscopic LSO with possible excision/fulguration of endometriosis implants, and trachelectomy. The risks of surgery were discussed in detail with the patient including but not limited to: bleeding which may require transfusion or reoperation; infection which may require prolonged hospitalization or re-hospitalization and antibiotic therapy; injury to bowel, bladder, ureters and major vessels or other surrounding organs; need for additional procedures including laparotomy; thromboembolic phenomenon, incisional problems and other postoperative or anesthesia complications.  Patient was told that the likelihood that her condition and symptoms will be treated effectively with this surgical management was high; the postoperative  expectations were also discussed in detail. The patient also understands the alternative treatment options which were discussed in full. All questions were answered.   She was told that she will be contacted by our surgical scheduler regarding the time and date of her surgery; routine preoperative instructions of having nothing to eat or drink after midnight on the day prior to surgery and also coming to the hospital 1.5 hours prior to her time of surgery were also emphasized.  She was told she may be called for a preoperative appointment about a week prior to surgery and will be given further preoperative instructions at that visit. Printed patient education handouts about the procedure were given to the patient to review at home.  Surgery scheduled for 11/15/2014.   Need to get clearance from Cardiology.    A total of 30 minutes were spent face-to-face with the patient during this encounter and over half of that time dealt with counseling and coordination of care.  Rubie Maid, MD [facility]

## 2014-10-30 NOTE — H&P (Signed)
Subjective:    Patient is a 54 y.o. G3P26female scheduled for Laparoscopic left salpingo-oophorectomy, possible excision/fulguration of endometriosis, trachelectomy. Indications for procedure are h/o endometriosis, left ovarian cyst, cervical cyst (nabothian vs endometriosis), pelvic pain.  I  Pertinent Gynecological History: Menses: no menses.  Patient s/p hysterectomy.  Contraception: status post hysterectomy Last mammogram: abnormal: left breast with possible mass, f/u by ultrasound with small 7 mm nodule, recommend f/u in 6 months.  Date: 03/2014 Last pap: normal Date: 05/2012  Discussed Blood/Blood Products: not applicable   Menstrual History: OB History    Gravida Para Term Preterm AB TAB SAB Ectopic Multiple Living   3         3      Obstetric Comments   1st Menstrual Cycle:  12 1st Pregnancy:  56       Menarche age: 54 No LMP recorded. Patient has had a hysterectomy.    Past Medical History  Diagnosis Date  . Thyroid disease   . Menopausal symptoms   . Anemia   . Tachycardia   . Hypertension   . Hypokalemia   . Clotting disorder   . Nonischemic cardiomyopathy     Interval improvement EF 40-45% 1/16  . Sleep apnea     Treated with CPAP  . Implantable defibrillator     [employer] DOI 2008, replacement 2015  . Hypercholesteremia   . Endometriosis of vagina 09/2013    Intra-operative findings of endometriosis implants on cervical stump    Past Surgical History  Procedure Laterality Date  . Cardiac defibrillator placement    . Total abdominal hysterectomy    . Cardiac catheterization  2003    [facility]: No significant coronary artery disease with reduced ejection fraction.  . Tonsillectomy    . Foot surgery    . Colonoscopy  2015  . Tubal ligation Bilateral   . Laparoscopic salpingoopherectomy Right 09/2013    with left salpingectomy    OB History  Gravida Para Term Preterm AB SAB TAB Ectopic Multiple Living  3         3    # Outcome Date GA Lbr Len/2nd Weight  Sex Delivery Anes PTL Lv  3 Gravida           2 Gravida           1 Gravida             Obstetric Comments  1st Menstrual Cycle:  10  1st Pregnancy:  15    Social History   Social History  . Marital Status: Divorced    Spouse Name: N/A  . Number of Children: N/A  . Years of Education: N/A   Social History Main Topics  . Smoking status: Never Smoker   . Smokeless tobacco: None  . Alcohol Use: 0.0 oz/week    0 Standard drinks or equivalent per week     Comment: 5 q week  . Drug Use: No  . Sexual Activity: Yes   Other Topics Concern  . None   Social History Narrative    Family History  Problem Relation Age of Onset  . Hypertension Mother   . Hyperlipidemia Mother   . Stroke Mother   . Colon cancer Mother     Current Outpatient Prescriptions on File Prior to Visit  Medication Sig Dispense Refill  . acetaminophen-codeine (TYLENOL #3) 300-30 MG per tablet Take 1-2 tablets by mouth every 4 (four) hours as needed for moderate pain. 30 tablet 0  . aspirin 81  MG tablet Take 81 mg by mouth daily.    . carvedilol (COREG) 25 MG tablet Take 1/2 tablet (12.5 mg) by mouth twice daily    . citalopram (CELEXA) 40 MG tablet TAKE ONE TABLET BY MOUTH ONCE DAILY    . furosemide (LASIX) 40 MG tablet TAKE ONE TABLET BY MOUTH ONCE DAILY *PATIENT OCCASIONALLY TAKES AN EXTRA TABLET*    . levothyroxine (SYNTHROID, LEVOTHROID) 125 MCG tablet Take 125 mcg by mouth daily before breakfast.    . lisinopril (PRINIVIL,ZESTRIL) 5 MG tablet TAKE ONE TABLET BY MOUTH ONCE DAILY 30 tablet 3  . norethindrone (MICRONOR,CAMILA,ERRIN) 0.35 MG tablet Take 1 tablet (0.35 mg total) by mouth daily. Take active pills only x 6 weeks (discard placebo pills). 1 Package 1  . pantoprazole (PROTONIX) 40 MG tablet Take 40 mg by mouth daily.    Marland Kitchen spironolactone (ALDACTONE) 25 MG tablet Take 0.5 tablets (12.5 mg total) by mouth daily. TAKE ONE-HALF TABLET BY MOUTH ONCE DAILY 90 tablet 3   No current  facility-administered medications on file prior to visit.    Allergies  Allergen Reactions  . Sulfamethoxazole-Trimethoprim     Other reaction(s): Unknown    Review of Systems Constitutional: No recent fever/chills/sweats Respiratory: No recent cough/bronchitis Cardiovascular: No chest pain Gastrointestinal: No recent nausea/vomiting/diarrhea Genitourinary: No UTI symptoms.  Positive for pelvic pain.  Hematologic/lymphatic:No history of coagulopathy or recent blood thinner use    Objective:    BP 116/79 mmHg  Pulse 81  Ht 5\' 7"  (1.702 m)  Wt 247 lb (112.038 kg)  BMI 38.68 kg/m2  General:   Normal, obese  Skin:   normal  HEENT:  Normal  Neck:  Supple without Adenopathy or Thyromegaly  Lungs:   Heart:              Breasts:   Abdomen:  Pelvis:  M/S   Extremeties:  Neuro:    clear to auscultation bilaterally   Normal without murmur   Not Examined   soft, non-tender; bowel sounds normal; no masses,  no organomegaly   Exam deferred to OR  No CVAT  Warm/Dry   Normal           Imaging:  Ultrasound 10/29/2014: The uterus is surgically absent. Cervical cyst contains internal echos and measures 2.2 x 1.9 x 2.0 cm.  Right Ovary is surgically absent. Left Ovary measures 3.1 x 2.5 x 2.2 cm. There is a simple appearing cyst which measures 1.6 x 1.9 x 1.9 cm.   Survey of the adnexa demonstrates no adnexal masses. There is a trace amount of free fluid in the cul de sac.  Impression: 1. There is a complex nabothian cyst at the cervix. 2. There is a simple appearing cyst in the left ovary. 3. Trace amount of free fluid in the cul de sac.   Ultrasound 08/26/2014:  1) Uterus and right ovary surgically absent.  2) Large complex cystic lesion, 2.8 x 2.4 x 2.1 cm visualized in the remnant of cervix vs.vaginal cuff (unclear of how much cervic remains). It's exact location (wall vs canal) is unclear. Considerations include complex nabothian cyst; malignancy not  excluded. Could consider MRI of the pelvis for further evaluation if deemed necessary by clinical exam and history.  3) Left ovary normal size, with simple cyst 2.4 cm.  Assessment:    1. Pelvic pain  2. Vaginal vs cervical stump complex cyst  3. Left ovarian simple cyst  4. H/o endometriosis of vaginal cuff/cervical stump (seen on last  operative laparoscopy in 2015)     Plan:  1. Discussion had with patient regarding ultrasound results. Noted that left ovariany cyst and cervical cyst are decreasing in size. Discussed continuation of OCPs for an additional 4-6 weeks. Patient asks if it is possible that she may develop another cyst (notes that she just had surgery for a large cyst on right ovary last year. After discussion of endometriosis and the possibility of future cysts or episodes of pelvic pain until onset of menopause, patient reports that she would now prefer surgical management. Noted that patient could use hormonal suppression (OCPs, Lupron) until menopause to manage symptoms. Patient declines further medical management and wishes to proceed with surgical management.  2. Pre-op performed today. Patient counseled on anticipated procedure of laparoscopic LSO with possible excision/fulguration of endometriosis implants, and trachelectomy. The risks of surgery were discussed in detail with the patient including but not limited to: bleeding which may require transfusion or reoperation; infection which may require prolonged hospitalization or re-hospitalization and antibiotic therapy; injury to bowel, bladder, ureters and major vessels or other surrounding organs; need for additional procedures including laparotomy; thromboembolic phenomenon, incisional problems and other postoperative or anesthesia complications. The patient also understands the alternative treatment options which were discussed in full. All questions were answered.Surgery scheduled for 11/15/2014. Need to get  clearance from Cardiology.  Preop testing ordered. Instructions reviewed, including NPO after midnight.   Rubie Maid, MD [facility] 10/30/2014 2:33 PM

## 2014-11-10 ENCOUNTER — Inpatient Hospital Stay: Admission: RE | Admit: 2014-11-10 | Payer: Medicare Other | Source: Ambulatory Visit

## 2014-11-12 ENCOUNTER — Telehealth: Payer: Self-pay

## 2014-11-12 ENCOUNTER — Encounter
Admission: RE | Admit: 2014-11-12 | Discharge: 2014-11-12 | Disposition: A | Discharge status: Home / Self Care | Payer: Medicare Other | Source: Ambulatory Visit | Attending: Obstetrics and Gynecology | Admitting: Obstetrics and Gynecology

## 2014-11-12 DIAGNOSIS — R000 Tachycardia, unspecified: Secondary | ICD-10-CM | POA: Diagnosis not present

## 2014-11-12 DIAGNOSIS — Z8249 Family history of ischemic heart disease and other diseases of the circulatory system: Secondary | ICD-10-CM | POA: Diagnosis not present

## 2014-11-12 DIAGNOSIS — G4730 Sleep apnea, unspecified: Secondary | ICD-10-CM | POA: Diagnosis not present

## 2014-11-12 DIAGNOSIS — Z800 Family history of malignant neoplasm of digestive organs: Secondary | ICD-10-CM | POA: Diagnosis not present

## 2014-11-12 DIAGNOSIS — D649 Anemia, unspecified: Secondary | ICD-10-CM | POA: Diagnosis not present

## 2014-11-12 DIAGNOSIS — I429 Cardiomyopathy, unspecified: Secondary | ICD-10-CM | POA: Diagnosis not present

## 2014-11-12 DIAGNOSIS — E876 Hypokalemia: Secondary | ICD-10-CM | POA: Diagnosis not present

## 2014-11-12 DIAGNOSIS — N939 Abnormal uterine and vaginal bleeding, unspecified: Secondary | ICD-10-CM | POA: Diagnosis not present

## 2014-11-12 DIAGNOSIS — Z95810 Presence of automatic (implantable) cardiac defibrillator: Secondary | ICD-10-CM | POA: Diagnosis not present

## 2014-11-12 DIAGNOSIS — Z823 Family history of stroke: Secondary | ICD-10-CM | POA: Diagnosis not present

## 2014-11-12 DIAGNOSIS — Z79899 Other long term (current) drug therapy: Secondary | ICD-10-CM | POA: Diagnosis not present

## 2014-11-12 DIAGNOSIS — D689 Coagulation defect, unspecified: Secondary | ICD-10-CM | POA: Diagnosis not present

## 2014-11-12 DIAGNOSIS — E079 Disorder of thyroid, unspecified: Secondary | ICD-10-CM | POA: Diagnosis not present

## 2014-11-12 DIAGNOSIS — N8302 Follicular cyst of left ovary: Secondary | ICD-10-CM | POA: Diagnosis not present

## 2014-11-12 DIAGNOSIS — Z90710 Acquired absence of both cervix and uterus: Secondary | ICD-10-CM | POA: Diagnosis not present

## 2014-11-12 DIAGNOSIS — Z8742 Personal history of other diseases of the female genital tract: Secondary | ICD-10-CM | POA: Diagnosis not present

## 2014-11-12 DIAGNOSIS — E7800 Pure hypercholesterolemia, unspecified: Secondary | ICD-10-CM | POA: Diagnosis not present

## 2014-11-12 DIAGNOSIS — Z882 Allergy status to sulfonamides status: Secondary | ICD-10-CM | POA: Diagnosis not present

## 2014-11-12 DIAGNOSIS — R102 Pelvic and perineal pain: Secondary | ICD-10-CM | POA: Diagnosis present

## 2014-11-12 DIAGNOSIS — I10 Essential (primary) hypertension: Secondary | ICD-10-CM | POA: Diagnosis not present

## 2014-11-12 DIAGNOSIS — N888 Other specified noninflammatory disorders of cervix uteri: Secondary | ICD-10-CM | POA: Diagnosis not present

## 2014-11-12 DIAGNOSIS — Z7982 Long term (current) use of aspirin: Secondary | ICD-10-CM | POA: Diagnosis not present

## 2014-11-12 HISTORY — DX: Cardiac arrhythmia, unspecified: I49.9

## 2014-11-12 HISTORY — DX: Atherosclerotic heart disease of native coronary artery without angina pectoris: I25.10

## 2014-11-12 HISTORY — DX: Reserved for inherently not codable concepts without codable children: IMO0001

## 2014-11-12 HISTORY — DX: Headache, unspecified: R51.9

## 2014-11-12 HISTORY — DX: Major depressive disorder, single episode, unspecified: F32.9

## 2014-11-12 HISTORY — DX: Unspecified osteoarthritis, unspecified site: M19.90

## 2014-11-12 HISTORY — DX: Hypothyroidism, unspecified: E03.9

## 2014-11-12 HISTORY — DX: Anxiety disorder, unspecified: F41.9

## 2014-11-12 HISTORY — DX: Headache: R51

## 2014-11-12 HISTORY — DX: Depression, unspecified: F32.A

## 2014-11-12 LAB — CBC
HEMATOCRIT: 39.4 % (ref 35.0–47.0)
HEMOGLOBIN: 13.4 g/dL (ref 12.0–16.0)
MCH: 31.8 pg (ref 26.0–34.0)
MCHC: 34.1 g/dL (ref 32.0–36.0)
MCV: 93.4 fL (ref 80.0–100.0)
PLATELETS: 159 10*3/uL (ref 150–440)
RBC: 4.22 MIL/uL (ref 3.80–5.20)
RDW: 13.8 % (ref 11.5–14.5)
WBC: 4 10*3/uL (ref 3.6–11.0)

## 2014-11-12 LAB — BASIC METABOLIC PANEL
ANION GAP: 6 (ref 5–15)
BUN: 12 mg/dL (ref 6–20)
CO2: 27 mmol/L (ref 22–32)
CREATININE: 0.71 mg/dL (ref 0.44–1.00)
Calcium: 9 mg/dL (ref 8.9–10.3)
Chloride: 106 mmol/L (ref 101–111)
GFR calc Af Amer: >60 mL/min (ref >60)
GFR calc non Af Amer: >60 mL/min (ref >60)
Glucose, Bld: 94 mg/dL (ref 65–99)
Potassium: 4.3 mmol/L (ref 3.5–5.1)
SODIUM: 139 mmol/L (ref 135–145)

## 2014-11-12 LAB — TYPE AND SCREEN
ABO/RH(D): B POS
Antibody Screen: NEGATIVE

## 2014-11-12 LAB — ABO/RH: ABO/RH(D): B POS

## 2014-11-12 NOTE — Patient Instructions (Signed)
  Your procedure is scheduled on: 11/15/14 Report to Day Surgery. [facility] SECOND FLOOR To find out your arrival time please call [phone] between 1PM - 3PM on 11/12/14  Remember: Instructions that are not followed completely may result in serious medical risk, up to and including death, or upon the discretion of your surgeon and anesthesiologist your surgery may need to be rescheduled.    _X___ 1. Do not eat food or drink liquids after midnight. No gum chewing or hard candies.     _X___ 2. No Alcohol for 24 hours before or after surgery.   ____ 3. Bring all medications with you on the day of surgery if instructed.    __X__ 4. Notify your doctor if there is any change in your medical condition     (cold, fever, infections).     Do not wear jewelry, make-up, hairpins, clips or nail polish.  Do not wear lotions, powders, or perfumes. You may wear deodorant.  Do not shave 48 hours prior to surgery. Men may shave face and neck.  Do not bring valuables to the hospital.    [facility] is not responsible for any belongings or valuables.               Contacts, dentures or bridgework may not be worn into surgery.  Leave your suitcase in the car. After surgery it may be brought to your room.  For patients admitted to the hospital, discharge time is determined by your                treatment team.   Patients discharged the day of surgery will not be allowed to drive home.   Please read over the following fact sheets that you were given:   Surgical Site Infection Prevention   ____ Take these medicines the morning of surgery with A SIP OF WATER:    1. CARVEDILOL  2. CELEXA  3. LEVOTHYROXINE  4.LISINOPRIL  5.PROTONIX  6.XANAX  ____ Fleet Enema (as directed)   __X__ Use CHG Soap as directed  ____ Use inhalers on the day of surgery  ____ Stop metformin 2 days prior to surgery    ____ Take 1/2 of usual insulin dose the night before surgery and none on the morning of surgery.    _X___ Stop Coumadin/Plavix/aspirin on   ASPIRIN ALREADY STOPPED  ____ Stop Anti-inflammatories on   ____ Stop supplements until after surgery.    _X___ Bring C-Pap to the hospital.

## 2014-11-12 NOTE — Telephone Encounter (Signed)
Erroneous entry

## 2014-11-12 NOTE — Telephone Encounter (Signed)
Received fax from [facility] PAT 10/14 at 11:28am for cardiac clearance. Surgery 11/15/14  Per Ignacia Bayley, requests we call pt to inquire of CP or SOB before giving clearance. Left message on pt home and cell phone to call office.   S/w Erin in PAT to inform that we are awaiting call from pt before clearance can be determined.  Erin verbalized understanding.

## 2014-11-12 NOTE — OR Nursing (Signed)
Faxed request for clearance note faxed and called to Dr Joeseph Amor with [facility]

## 2014-11-15 ENCOUNTER — Encounter: Payer: Self-pay | Admitting: *Deleted

## 2014-11-15 ENCOUNTER — Telehealth: Payer: Self-pay

## 2014-11-15 ENCOUNTER — Ambulatory Visit: Payer: Medicare Other | Admitting: *Deleted

## 2014-11-15 ENCOUNTER — Observation Stay
Admission: RE | Admit: 2014-11-15 | Discharge: 2014-11-16 | Disposition: A | Discharge status: Home / Self Care | Payer: Medicare Other | Source: Ambulatory Visit | Attending: Obstetrics and Gynecology | Admitting: Obstetrics and Gynecology

## 2014-11-15 ENCOUNTER — Encounter
Admission: RE | Disposition: A | Discharge status: Home / Self Care | Payer: Self-pay | Source: Ambulatory Visit | Attending: Obstetrics and Gynecology

## 2014-11-15 DIAGNOSIS — E7800 Pure hypercholesterolemia, unspecified: Secondary | ICD-10-CM | POA: Insufficient documentation

## 2014-11-15 DIAGNOSIS — E079 Disorder of thyroid, unspecified: Secondary | ICD-10-CM | POA: Insufficient documentation

## 2014-11-15 DIAGNOSIS — E876 Hypokalemia: Secondary | ICD-10-CM | POA: Insufficient documentation

## 2014-11-15 DIAGNOSIS — N83292 Other ovarian cyst, left side: Secondary | ICD-10-CM | POA: Diagnosis not present

## 2014-11-15 DIAGNOSIS — Z90710 Acquired absence of both cervix and uterus: Secondary | ICD-10-CM | POA: Insufficient documentation

## 2014-11-15 DIAGNOSIS — D649 Anemia, unspecified: Secondary | ICD-10-CM | POA: Insufficient documentation

## 2014-11-15 DIAGNOSIS — Z79899 Other long term (current) drug therapy: Secondary | ICD-10-CM | POA: Insufficient documentation

## 2014-11-15 DIAGNOSIS — Z882 Allergy status to sulfonamides status: Secondary | ICD-10-CM | POA: Insufficient documentation

## 2014-11-15 DIAGNOSIS — G4730 Sleep apnea, unspecified: Secondary | ICD-10-CM | POA: Insufficient documentation

## 2014-11-15 DIAGNOSIS — D689 Coagulation defect, unspecified: Secondary | ICD-10-CM | POA: Insufficient documentation

## 2014-11-15 DIAGNOSIS — Z7982 Long term (current) use of aspirin: Secondary | ICD-10-CM | POA: Insufficient documentation

## 2014-11-15 DIAGNOSIS — R000 Tachycardia, unspecified: Secondary | ICD-10-CM | POA: Insufficient documentation

## 2014-11-15 DIAGNOSIS — N8302 Follicular cyst of left ovary: Secondary | ICD-10-CM | POA: Insufficient documentation

## 2014-11-15 DIAGNOSIS — N888 Other specified noninflammatory disorders of cervix uteri: Principal | ICD-10-CM | POA: Insufficient documentation

## 2014-11-15 DIAGNOSIS — N809 Endometriosis, unspecified: Secondary | ICD-10-CM | POA: Diagnosis not present

## 2014-11-15 DIAGNOSIS — Z823 Family history of stroke: Secondary | ICD-10-CM | POA: Insufficient documentation

## 2014-11-15 DIAGNOSIS — I10 Essential (primary) hypertension: Secondary | ICD-10-CM | POA: Insufficient documentation

## 2014-11-15 DIAGNOSIS — Z8742 Personal history of other diseases of the female genital tract: Secondary | ICD-10-CM | POA: Insufficient documentation

## 2014-11-15 DIAGNOSIS — R102 Pelvic and perineal pain: Secondary | ICD-10-CM | POA: Insufficient documentation

## 2014-11-15 DIAGNOSIS — Z95810 Presence of automatic (implantable) cardiac defibrillator: Secondary | ICD-10-CM | POA: Insufficient documentation

## 2014-11-15 DIAGNOSIS — I429 Cardiomyopathy, unspecified: Secondary | ICD-10-CM | POA: Insufficient documentation

## 2014-11-15 DIAGNOSIS — Z8249 Family history of ischemic heart disease and other diseases of the circulatory system: Secondary | ICD-10-CM | POA: Insufficient documentation

## 2014-11-15 DIAGNOSIS — Z9889 Other specified postprocedural states: Secondary | ICD-10-CM

## 2014-11-15 DIAGNOSIS — N939 Abnormal uterine and vaginal bleeding, unspecified: Secondary | ICD-10-CM | POA: Insufficient documentation

## 2014-11-15 DIAGNOSIS — Z800 Family history of malignant neoplasm of digestive organs: Secondary | ICD-10-CM | POA: Insufficient documentation

## 2014-11-15 HISTORY — PX: CYSTOSCOPY: SHX5120

## 2014-11-15 HISTORY — PX: TRACHELECTOMY: SHX6586

## 2014-11-15 HISTORY — DX: Failed or difficult intubation, initial encounter: T88.4XXA

## 2014-11-15 HISTORY — PX: LAPAROSCOPIC SALPINGO OOPHERECTOMY: SHX5927

## 2014-11-15 SURGERY — SALPINGO-OOPHORECTOMY, LAPAROSCOPIC
Anesthesia: General | Site: Vagina | Wound class: Clean Contaminated

## 2014-11-15 MED ORDER — HYDROMORPHONE HCL 1 MG/ML IJ SOLN
INTRAMUSCULAR | Status: AC
  Administered 2014-11-15: 0.5 mg via INTRAVENOUS
  Filled 2014-11-15: qty 1

## 2014-11-15 MED ORDER — ONDANSETRON HCL 4 MG PO TABS: 4.0000 mg | ORAL_TABLET | Freq: Four times a day (QID) | ORAL | Status: DC | PRN

## 2014-11-15 MED ORDER — PHENYLEPHRINE HCL 10 MG/ML IJ SOLN
INTRAMUSCULAR | Status: DC | PRN
  Administered 2014-11-15 (×6): 100 ug via INTRAVENOUS

## 2014-11-15 MED ORDER — ROCURONIUM BROMIDE 100 MG/10ML IV SOLN
INTRAVENOUS | Status: DC | PRN
  Administered 2014-11-15 (×4): 10 mg via INTRAVENOUS
  Administered 2014-11-15: 40 mg via INTRAVENOUS

## 2014-11-15 MED ORDER — ASPIRIN EC 81 MG PO TBEC
81.0000 mg | DELAYED_RELEASE_TABLET | Freq: Every day | ORAL | Status: DC
  Administered 2014-11-15: 81 mg via ORAL
  Filled 2014-11-15 (×2): qty 1

## 2014-11-15 MED ORDER — CARVEDILOL 12.5 MG PO TABS
12.5000 mg | ORAL_TABLET | Freq: Two times a day (BID) | ORAL | Status: DC
  Administered 2014-11-15 – 2014-11-16 (×2): 12.5 mg via ORAL
  Filled 2014-11-15 (×4): qty 1

## 2014-11-15 MED ORDER — PROPOFOL 10 MG/ML IV BOLUS
INTRAVENOUS | Status: DC | PRN
  Administered 2014-11-15: 150 mg via INTRAVENOUS
  Administered 2014-11-15: 50 mg via INTRAVENOUS

## 2014-11-15 MED ORDER — CITALOPRAM HYDROBROMIDE 20 MG PO TABS
40.0000 mg | ORAL_TABLET | Freq: Every day | ORAL | Status: DC
  Administered 2014-11-16: 40 mg via ORAL
  Filled 2014-11-15 (×2): qty 2

## 2014-11-15 MED ORDER — LACTATED RINGERS IV SOLN: INTRAVENOUS | Status: DC

## 2014-11-15 MED ORDER — CITRIC ACID-SODIUM CITRATE 334-500 MG/5ML PO SOLN
30.0000 mL | ORAL | Status: DC
  Filled 2014-11-15: qty 30

## 2014-11-15 MED ORDER — LISINOPRIL 5 MG PO TABS
5.0000 mg | ORAL_TABLET | Freq: Every day | ORAL | Status: DC
  Filled 2014-11-15 (×2): qty 1

## 2014-11-15 MED ORDER — SIMETHICONE 80 MG PO CHEW: 80.0000 mg | CHEWABLE_TABLET | Freq: Four times a day (QID) | ORAL | Status: DC | PRN

## 2014-11-15 MED ORDER — SODIUM CHLORIDE 0.9 % IJ SOLN
INTRAMUSCULAR | Status: AC
  Filled 2014-11-15: qty 50

## 2014-11-15 MED ORDER — BUPIVACAINE HCL 0.5 % IJ SOLN
INTRAMUSCULAR | Status: DC | PRN
  Administered 2014-11-15: 10 mL

## 2014-11-15 MED ORDER — SUCCINYLCHOLINE CHLORIDE 20 MG/ML IJ SOLN
INTRAMUSCULAR | Status: DC | PRN
  Administered 2014-11-15: 100 mg via INTRAVENOUS
  Administered 2014-11-15: 80 mg via INTRAVENOUS

## 2014-11-15 MED ORDER — DOCUSATE SODIUM 100 MG PO CAPS
100.0000 mg | ORAL_CAPSULE | Freq: Two times a day (BID) | ORAL | Status: DC
  Administered 2014-11-15 – 2014-11-16 (×2): 100 mg via ORAL
  Filled 2014-11-15 (×2): qty 1

## 2014-11-15 MED ORDER — LORATADINE 10 MG PO TABS
10.0000 mg | ORAL_TABLET | Freq: Every day | ORAL | Status: DC
  Administered 2014-11-15 – 2014-11-16 (×2): 10 mg via ORAL
  Filled 2014-11-15 (×2): qty 1

## 2014-11-15 MED ORDER — LACTATED RINGERS IV SOLN
INTRAVENOUS | Status: DC
  Administered 2014-11-15 – 2014-11-16 (×2): via INTRAVENOUS

## 2014-11-15 MED ORDER — KETOROLAC TROMETHAMINE 30 MG/ML IJ SOLN
INTRAMUSCULAR | Status: AC
  Filled 2014-11-15: qty 1

## 2014-11-15 MED ORDER — FUROSEMIDE 40 MG PO TABS
40.0000 mg | ORAL_TABLET | Freq: Every day | ORAL | Status: DC
  Filled 2014-11-15 (×2): qty 1

## 2014-11-15 MED ORDER — CEFAZOLIN SODIUM-DEXTROSE 2-3 GM-% IV SOLR
2.0000 g | INTRAVENOUS | Status: AC
  Administered 2014-11-15: 2 g via INTRAVENOUS

## 2014-11-15 MED ORDER — ALPRAZOLAM 0.5 MG PO TABS
0.2500 mg | ORAL_TABLET | Freq: Every evening | ORAL | Status: DC | PRN
  Administered 2014-11-16: 0.25 mg via ORAL
  Filled 2014-11-15: qty 1

## 2014-11-15 MED ORDER — IBUPROFEN 800 MG PO TABS
800.0000 mg | ORAL_TABLET | Freq: Four times a day (QID) | ORAL | Status: DC | PRN
  Administered 2014-11-15 – 2014-11-16 (×3): 800 mg via ORAL
  Filled 2014-11-15 (×3): qty 1

## 2014-11-15 MED ORDER — NEOSTIGMINE METHYLSULFATE 10 MG/10ML IV SOLN
INTRAVENOUS | Status: DC | PRN
  Administered 2014-11-15: 3 mg via INTRAVENOUS

## 2014-11-15 MED ORDER — DEXAMETHASONE SODIUM PHOSPHATE 4 MG/ML IJ SOLN
INTRAMUSCULAR | Status: DC | PRN
  Administered 2014-11-15: 8 mg via INTRAVENOUS

## 2014-11-15 MED ORDER — ALUM & MAG HYDROXIDE-SIMETH 200-200-20 MG/5ML PO SUSP: 30.0000 mL | ORAL | Status: DC | PRN

## 2014-11-15 MED ORDER — LEVOTHYROXINE SODIUM 75 MCG PO TABS
150.0000 ug | ORAL_TABLET | Freq: Every day | ORAL | Status: DC
  Administered 2014-11-16: 150 ug via ORAL
  Filled 2014-11-15: qty 2

## 2014-11-15 MED ORDER — ONDANSETRON HCL 4 MG/2ML IJ SOLN
INTRAMUSCULAR | Status: DC | PRN
  Administered 2014-11-15: 4 mg via INTRAVENOUS

## 2014-11-15 MED ORDER — OXYCODONE-ACETAMINOPHEN 5-325 MG PO TABS
1.0000 | ORAL_TABLET | ORAL | Status: DC | PRN
  Administered 2014-11-15 – 2014-11-16 (×4): 2 via ORAL
  Filled 2014-11-15 (×4): qty 2

## 2014-11-15 MED ORDER — ZOLPIDEM TARTRATE 5 MG PO TABS: 5.0000 mg | ORAL_TABLET | Freq: Every evening | ORAL | Status: DC | PRN

## 2014-11-15 MED ORDER — GLYCOPYRROLATE 0.2 MG/ML IJ SOLN
INTRAMUSCULAR | Status: DC | PRN
  Administered 2014-11-15: 0.4 mg via INTRAVENOUS

## 2014-11-15 MED ORDER — LACTATED RINGERS IV SOLN
INTRAVENOUS | Status: DC
  Administered 2014-11-15 (×3): via INTRAVENOUS

## 2014-11-15 MED ORDER — CEFAZOLIN SODIUM-DEXTROSE 2-3 GM-% IV SOLR
INTRAVENOUS | Status: AC
Start: 2014-11-15 — End: 2014-11-15
  Administered 2014-11-15: 2 g via INTRAVENOUS
  Filled 2014-11-15: qty 50

## 2014-11-15 MED ORDER — THROMBIN 5000 UNITS EX SOLR
CUTANEOUS | Status: DC | PRN
  Administered 2014-11-15: 5000 [IU] via TOPICAL

## 2014-11-15 MED ORDER — THROMBIN 5000 UNITS EX SOLR
CUTANEOUS | Status: AC
  Filled 2014-11-15: qty 5000

## 2014-11-15 MED ORDER — ONDANSETRON HCL 4 MG/2ML IJ SOLN: 4.0000 mg | Freq: Once | INTRAMUSCULAR | Status: DC | PRN

## 2014-11-15 MED ORDER — BUPIVACAINE HCL (PF) 0.5 % IJ SOLN
INTRAMUSCULAR | Status: AC
  Filled 2014-11-15: qty 30

## 2014-11-15 MED ORDER — MENTHOL 3 MG MT LOZG: 1.0000 | LOZENGE | OROMUCOSAL | Status: DC | PRN

## 2014-11-15 MED ORDER — HYDROMORPHONE HCL 1 MG/ML IJ SOLN
0.2500 mg | INTRAMUSCULAR | Status: DC | PRN
  Administered 2014-11-15: 0.5 mg via INTRAVENOUS

## 2014-11-15 MED ORDER — LIDOCAINE HCL (CARDIAC) 20 MG/ML IV SOLN
INTRAVENOUS | Status: DC | PRN
  Administered 2014-11-15: 50 mg via INTRAVENOUS

## 2014-11-15 MED ORDER — HYDROMORPHONE HCL 1 MG/ML IJ SOLN: 0.2000 mg | INTRAMUSCULAR | Status: DC | PRN

## 2014-11-15 MED ORDER — FLUORESCEIN SODIUM 10 % IJ SOLN
INTRAMUSCULAR | Status: AC
  Filled 2014-11-15: qty 5

## 2014-11-15 MED ORDER — ONDANSETRON HCL 4 MG/2ML IJ SOLN: 4.0000 mg | Freq: Four times a day (QID) | INTRAMUSCULAR | Status: DC | PRN

## 2014-11-15 MED ORDER — KETOROLAC TROMETHAMINE 30 MG/ML IJ SOLN
30.0000 mg | Freq: Once | INTRAMUSCULAR | Status: AC
  Administered 2014-11-15: 30 mg via INTRAVENOUS

## 2014-11-15 MED ORDER — SPIRONOLACTONE 12.5 MG HALF TABLET
12.5000 mg | ORAL_TABLET | Freq: Every day | ORAL | Status: DC
  Administered 2014-11-15: 12.5 mg via ORAL
  Filled 2014-11-15 (×2): qty 1

## 2014-11-15 MED ORDER — BISACODYL 10 MG RE SUPP: 10.0000 mg | Freq: Every day | RECTAL | Status: DC | PRN

## 2014-11-15 MED ORDER — FENTANYL CITRATE (PF) 100 MCG/2ML IJ SOLN
INTRAMUSCULAR | Status: DC | PRN
  Administered 2014-11-15 (×6): 50 ug via INTRAVENOUS

## 2014-11-15 MED ORDER — PANTOPRAZOLE SODIUM 40 MG PO TBEC
40.0000 mg | DELAYED_RELEASE_TABLET | Freq: Every day | ORAL | Status: DC
  Administered 2014-11-16: 40 mg via ORAL
  Filled 2014-11-15: qty 1

## 2014-11-15 MED ORDER — MIDAZOLAM HCL 2 MG/2ML IJ SOLN
INTRAMUSCULAR | Status: DC | PRN
  Administered 2014-11-15: 2 mg via INTRAVENOUS

## 2014-11-15 SURGICAL SUPPLY — 62 items
APPLICATOR SURGIFLO ENDO (HEMOSTASIS) ×5 IMPLANT
BAG URO DRAIN 2000ML W/SPOUT (MISCELLANEOUS) ×5 IMPLANT
BLADE SURG SZ10 CARB STEEL (BLADE) ×5 IMPLANT
BLADE SURG SZ11 CARB STEEL (BLADE) ×5 IMPLANT
CANISTER SUCT 1200ML W/VALVE (MISCELLANEOUS) ×5 IMPLANT
CATH FOLEY 2WAY  5CC 16FR (CATHETERS) ×2
CATH ROBINSON RED A/P 16FR (CATHETERS) IMPLANT
CATH URTH 16FR FL 2W BLN LF (CATHETERS) ×3 IMPLANT
CHLORAPREP W/TINT 26ML (MISCELLANEOUS) ×5 IMPLANT
CORD MONOPOLAR M/FML 12FT (MISCELLANEOUS) ×5 IMPLANT
DRAPE PERI LITHO V/GYN (MISCELLANEOUS) ×5 IMPLANT
DRAPE SHEET LG 3/4 BI-LAMINATE (DRAPES) ×5 IMPLANT
DRAPE UNDER BUTTOCK W/FLU (DRAPES) ×5 IMPLANT
DRSG TEGADERM 2-3/8X2-3/4 SM (GAUZE/BANDAGES/DRESSINGS) ×15 IMPLANT
GAUZE PACK 2X3YD (MISCELLANEOUS) ×5 IMPLANT
GLOVE BIO SURGEON STRL SZ 6 (GLOVE) ×30 IMPLANT
GLOVE BIOGEL PI IND STRL 6.5 (GLOVE) ×18 IMPLANT
GLOVE BIOGEL PI INDICATOR 6.5 (GLOVE) ×12
GOWN STRL REUS W/ TWL LRG LVL3 (GOWN DISPOSABLE) ×9 IMPLANT
GOWN STRL REUS W/ TWL XL LVL3 (GOWN DISPOSABLE) ×3 IMPLANT
GOWN STRL REUS W/TWL LRG LVL3 (GOWN DISPOSABLE) ×6
GOWN STRL REUS W/TWL XL LVL3 (GOWN DISPOSABLE) ×2
IRRIGATION STRYKERFLOW (MISCELLANEOUS) ×3 IMPLANT
IRRIGATOR STRYKERFLOW (MISCELLANEOUS) ×5
IV LACTATED RINGERS 1000ML (IV SOLUTION) ×5 IMPLANT
KIT RM TURNOVER CYSTO AR (KITS) ×5 IMPLANT
LABEL OR SOLS (LABEL) IMPLANT
LIQUID BAND (GAUZE/BANDAGES/DRESSINGS) ×5 IMPLANT
NEEDLE SPNL 18GX3.5 QUINCKE PK (NEEDLE) ×5 IMPLANT
NEEDLE SPNL 22GX3.5 QUINCKE BK (NEEDLE) ×5 IMPLANT
NEEDLE SPNL 25GX3.5 QUINCKE BL (NEEDLE) ×5 IMPLANT
NS IRRIG 1000ML POUR BTL (IV SOLUTION) ×5 IMPLANT
NS IRRIG 500ML POUR BTL (IV SOLUTION) ×5 IMPLANT
PACK BASIN MINOR ARMC (MISCELLANEOUS) ×5 IMPLANT
PACK GYN LAPAROSCOPIC (MISCELLANEOUS) ×5 IMPLANT
PAD GROUND ADULT SPLIT (MISCELLANEOUS) ×5 IMPLANT
PAD OB MATERNITY 4.3X12.25 (PERSONAL CARE ITEMS) ×5 IMPLANT
PAD PREP 24X41 OB/GYN DISP (PERSONAL CARE ITEMS) ×5 IMPLANT
PENCIL ELECTRO HAND CTR (MISCELLANEOUS) IMPLANT
POUCH ENDO CATCH 10MM SPEC (MISCELLANEOUS) ×5 IMPLANT
SCISSORS METZENBAUM CVD 33 (INSTRUMENTS) IMPLANT
SHEARS HARMONIC ACE PLUS 36CM (ENDOMECHANICALS) ×5 IMPLANT
SLEEVE ENDOPATH XCEL 5M (ENDOMECHANICALS) ×10 IMPLANT
SPOGE SURGIFLO 8M (HEMOSTASIS) ×2
SPONGE SURGIFLO 8M (HEMOSTASIS) ×3 IMPLANT
SPONGE XRAY 4X4 16PLY STRL (MISCELLANEOUS) IMPLANT
SUT CHROMIC 2 0 CT 1 (SUTURE) ×5 IMPLANT
SUT VIC AB 0 CT1 27 (SUTURE)
SUT VIC AB 0 CT1 27XCR 8 STRN (SUTURE) IMPLANT
SUT VIC AB 0 CT1 36 (SUTURE) ×10 IMPLANT
SUT VIC AB 0 CT2 27 (SUTURE) ×5 IMPLANT
SUT VIC AB 2-0 UR6 27 (SUTURE) ×5 IMPLANT
SUT VIC AB 3-0 SH 27 (SUTURE)
SUT VIC AB 3-0 SH 27X BRD (SUTURE) IMPLANT
SUT VICRYL 0 AB UR-6 (SUTURE) ×5 IMPLANT
SYRINGE 10CC LL (SYRINGE) ×5 IMPLANT
TROCAR ENDO BLADELESS 11MM (ENDOMECHANICALS) ×5 IMPLANT
TROCAR XCEL NON-BLD 5MMX100MML (ENDOMECHANICALS) ×5 IMPLANT
TROCAR XCEL UNIV SLVE 11M 100M (ENDOMECHANICALS) ×5 IMPLANT
TUBING CONNECTING 10 (TUBING) ×4 IMPLANT
TUBING CONNECTING 10' (TUBING) ×1
TUBING INSUFFLATOR HI FLOW (MISCELLANEOUS) ×5 IMPLANT

## 2014-11-15 NOTE — Transfer of Care (Signed)
Immediate Anesthesia Transfer of Care Note  Patient: Lisa Fuentes  Procedure(s) Performed: Procedure(s): LAPAROSCOPIC OOPHORECTOMY (Left) TRACHELECTOMY (N/A) CYSTOSCOPY  Patient Location: PACU  Anesthesia Type:General  Level of Consciousness: sedated  Airway & Oxygen Therapy: Patient Spontanous Breathing and Patient connected to face mask oxygen  Post-op Assessment: Report given to RN and Post -op Vital signs reviewed and stable  Post vital signs: Reviewed and stable  Last Vitals:  Filed Vitals:   11/15/14 1455  BP: 97/66  Pulse: 60  Temp: 36.9 C  Resp: 17    Complications: No apparent anesthesia complications

## 2014-11-15 NOTE — H&P (Signed)
UPDATE TO PREVIOUS HISTORY AND PHYSICAL  The patient has been seen and examined.  Her H&P has been reviewed and the following changes are noted:   Lisa Fuentes is a 55 y.o. [phone] female scheduled for laparoscopic left oophorectomy, possible excision/fulguration of endometriosis, trachelectomy. Indications for procedure are h/o endometriosis, left ovarian cyst, cervical cyst (nabothian vs endometriosis), pelvic pain.  Patient has had prior laparoscopic right oophorectomy with bilateral salpingectomy in 2015 for large right adnexal cyst and pelvic pain.  Consent corrected for current procedure.  Site marked at patient's bedside.  Patient can proceed to the OR for scheduled procedure when ready.    Rubie Maid, MD 11/15/2014 11:10 AM

## 2014-11-15 NOTE — Telephone Encounter (Signed)
Notified Erin in PAT that I have contacted Dr. Fletcher Anon regarding cardiac clearance for pt surgery today. Junie Panning states pt will not check in at PAT but will go directly to surgery check in.  S/w Jenny Reichmann, charge nurse in same day surgery, who states pt scheduled to arrive at 10:15am, surgery 11-11:30am.  Anesthesia would prefer cardiac clearance before surgery but will most likely not cancel if no clearance. Informed Jenny Reichmann I have sent a message to Dr. Fletcher Anon for clearance and will fax and call back once I hear from him. Surgery fax: [phone] Jenny Reichmann: [phone]

## 2014-11-15 NOTE — OR Nursing (Signed)
After discussion with Dr. Marcelline Mates it has been confirmed that patient's procedure today is for a left salpingo oophorectomy not the original posted right side. Patient has consented and procedure to start soon.

## 2014-11-15 NOTE — Anesthesia Postprocedure Evaluation (Signed)
  Anesthesia Post-op Note  Patient: Lisa Fuentes  Procedure(s) Performed: Procedure(s): LAPAROSCOPIC SALPINGO OOPHORECTOMY (Left) TRACHELECTOMY (N/A)  Anesthesia type:General  Patient location: PACU  Post pain: Pain level controlled  Post assessment: Post-op Vital signs reviewed, Patient's Cardiovascular Status Stable, Respiratory Function Stable, Patent Airway and No signs of Nausea or vomiting  Post vital signs: Reviewed and stable  Last Vitals:  Filed Vitals:   11/15/14 1129  BP:   Pulse:   Temp: 36.8 C  Resp:     Level of consciousness: awake, alert  and patient cooperative  Complications: No apparent anesthesia complications

## 2014-11-15 NOTE — Op Note (Signed)
Laparoscopic Oophorectomy/Trachelectomy Procedure Note  Indications:  54 y.o. G36P3003 female with h/o endometriosis, left ovarian cyst, cervical cyst (nabothian vs endometriosis), pelvic pain  Pre-operative Diagnosis: Left pelvic pain, ovarian cyst and cervical cyst, h/o endometriosis  Post-operative Diagnosis: same  Surgeon: Surgeon(s) and Role:    * Rubie Maid, MD - Primary   Assistants: None  Procedure: Laparoscopic left oophorectomy, vaginal trachelectomy, cystoscopy  Anesthesia: General endotracheal anesthesia  ASA Class: IV  Findings:  EUA:  - Cervix with good descensus, mobile.  Obliterated cervical os. Nabothian cysts present. Laparoscopy:  - Normal upper abdomen - Cervical stump with few adhesions of peritoneal fat.  - Left ovary normal appearing.  Left fallopian tube surgically absent.  - Right fallopian tube and ovary surgically absent Cystoscopy:  - bilateral efflux of fluorescein dye from ureteric orifices.  Estimated Blood Loss:  350 ml   Drains: foley catheter with 400 ml at end of procedure.          Total IV Fluids:  1400  ml  Specimens:  Cervical stump and left ovary  Procedure Details:   TECHNIQUE:  The patient was taken to the operating room where general anesthesia was obtained without difficulty.  She was then placed in the dorsal lithotomy position and prepared and draped in sterile fashion. After an adequate timeout was performed, a bivalved speculum was then placed in the patient's vagina, and the cervix was visualized. The cervix was grasped with a single-toothed tenaculum, and brought down into the vagina, with moderate descensus noted. Next the tenaculum was removed and a sponge-stick was placed into the vagina for manipulation.  The speculum was removed from the vagina.  A foley catheter was placed.   Attention was then turned to the patient's abdomen where a 5-mm skin incision was made in the umbilical fold.  The Optiview 5-mm trocar and sleeve  were then advanced without difficulty with the laparoscope under direct visualization into the abdomen.  The abdomen was then insufflated with carbon dioxide gas.  Adequate pneumoperitoneum was obtained.  A survey of the patient's pelvis and abdomen revealed the findings above. Bilateral 5-mm lower quadrant ports were then placed under direct visualization.  All skin incision were injected with 5 cc of 0.5% Marcaine, with a total of 15 cc used. The left ovary was grasped and elevated.  The ureters were unable to be identified bilaterally due to redundant/excessive peritoneal tissue.The Harmonic scalpel was then used to coagulate and ligate the left infundibulopelvic ligament.  The operative site was surveyed, and it was found to be hemostatic.     Attention was then turned to her pelvis.  A weighted speculum was then placed in the vagina, and the anterior and posterior lips of the cervix were grasped bilaterally with tenaculums.  The cervix was then injected circumferentially with normal saline to maintain hemostasis.  The cervix was then circumferentially incised, and the posterior cul-de-sac was entered sharply without difficulty.  A long weighted speculum was inserted into the posterior cul-de-sac. The Heaney clamp was then used to clamp the uterosacral ligaments on either side.  They were then cut and sutured ligated with 0 Vicryl, and were held with a tag for later identification. Of note, all sutures used in this case were 0 Vicryl unless otherwise noted.   The cardinal ligaments were then clamped, cut and ligated bilaterally.  At this point, full entry into the anterior cul-de-sac was made, no injury to the bladder was noted.  The cervical stump was then delivered  and sent to pathology.  The vaginal cuff was then closed in a running locked fashion with 0 Vicryl with care given to incorporate the uterosacral pedicles bilaterally.  All instruments were then removed from the pelvis, and a vaginal packing  saturated with estrogen cream was placed. Cystoscopy was then performed which showed bilateral ureteral jets after intravenous fluorescein dye administration and no evidence of suture material.  Attention was then returned to her abdomen which was insufflated again with carbon dioxide gas.  The laparoscope was used to survey the operative site, and there was slight oozing on the vaginal cuff. Surgiflo was then placed over the operative site to help with hemostasis.  No intraoperative injury to other surrounding organs was noted.  The abdomen was desufflated and all instruments were then removed from the patient's abdomen.   All skin incisions were closed with 2-0 Vicryl.  The patient tolerated the procedure well.  All instruments, needles, and sponge counts were correct x 2. The patient was taken to the recovery room awake, extubated and in stable condition.           Implants: None         Complications:  None; patient tolerated the procedure well.         Disposition: PACU - hemodynamically stable.         Condition: stable   Rubie Maid, MD [facility]

## 2014-11-15 NOTE — Anesthesia Preprocedure Evaluation (Addendum)
Anesthesia Evaluation  Patient identified by MRN, date of birth, ID band Patient awake    Reviewed: Allergy & Precautions, NPO status , Patient's Chart, lab work & pertinent test results  Airway Mallampati: IV  TM Distance: <3 FB Neck ROM: Limited    Dental  (+) Teeth Intact   Pulmonary shortness of breath and with exertion, sleep apnea and Continuous Positive Airway Pressure Ventilation ,    breath sounds clear to auscultation       Cardiovascular Exercise Tolerance: Poor hypertension, Pt. on medications and Pt. on home beta blockers + CAD  (-) dysrhythmias + Cardiac Defibrillator  Rhythm:Regular Rate:Normal  Cardiomyopathy. EF in 2000 was 20%, has improved to 45%. The AICD was inserted when her EF was 20%. N afib. On ASA, no other blood thinners.   Neuro/Psych  Headaches, Anxiety Depression    GI/Hepatic   Endo/Other  Treated hypothyroidism.  Renal/GU      Musculoskeletal  (+) Arthritis , Osteoarthritis,    Abdominal (+) + obese,   Peds  Hematology   Anesthesia Other Findings   Reproductive/Obstetrics                           Anesthesia Physical Anesthesia Plan  ASA: IV  Anesthesia Plan: General   Post-op Pain Management:    Induction: Intravenous  Airway Management Planned: Oral ETT and Video Laryngoscope Planned  Additional Equipment:   Intra-op Plan:   Post-operative Plan: Extubation in OR  Informed Consent: I have reviewed the patients History and Physical, chart, labs and discussed the procedure including the risks, benefits and alternatives for the proposed anesthesia with the patient or authorized representative who has indicated his/her understanding and acceptance.     Plan Discussed with: CRNA  Anesthesia Plan Comments:         Anesthesia Quick Evaluation

## 2014-11-15 NOTE — Telephone Encounter (Signed)
Paged MD, awaiting call back for clearance for surgery. Notified Cindy in Same Day Surgery that we are awaiting CB. Provided Dr. Tyrell Antonio pager number to [facility] if anesthesiology would like to contact him directly for clearance.

## 2014-11-16 ENCOUNTER — Institutional Professional Consult (permissible substitution): Payer: Medicare Other | Admitting: Internal Medicine

## 2014-11-16 DIAGNOSIS — N888 Other specified noninflammatory disorders of cervix uteri: Secondary | ICD-10-CM | POA: Diagnosis not present

## 2014-11-16 LAB — CBC
HCT: 34.2 % — ABNORMAL LOW (ref 35.0–47.0)
HEMOGLOBIN: 11.7 g/dL — AB (ref 12.0–16.0)
MCH: 32.3 pg (ref 26.0–34.0)
MCHC: 34.2 g/dL (ref 32.0–36.0)
MCV: 94.4 fL (ref 80.0–100.0)
Platelets: 138 10*3/uL — ABNORMAL LOW (ref 150–440)
RBC: 3.63 MIL/uL — AB (ref 3.80–5.20)
RDW: 13.5 % (ref 11.5–14.5)
WBC: 10.7 10*3/uL (ref 3.6–11.0)

## 2014-11-16 MED ORDER — OXYCODONE-ACETAMINOPHEN 5-325 MG PO TABS: 1.0000 | ORAL_TABLET | Freq: Four times a day (QID) | ORAL | Status: DC | PRN

## 2014-11-16 MED ORDER — IBUPROFEN 800 MG PO TABS: 800.0000 mg | ORAL_TABLET | Freq: Four times a day (QID) | ORAL | Status: DC | PRN

## 2014-11-16 MED ORDER — SIMETHICONE 80 MG PO CHEW: 80.0000 mg | CHEWABLE_TABLET | Freq: Four times a day (QID) | ORAL | Status: DC | PRN

## 2014-11-16 MED ORDER — DOCUSATE SODIUM 100 MG PO CAPS: 100.0000 mg | ORAL_CAPSULE | Freq: Two times a day (BID) | ORAL | Status: DC | PRN

## 2014-11-16 NOTE — Progress Notes (Signed)
1 Day Post-Op Procedure(s) (LRB): LAPAROSCOPIC OOPHORECTOMY (Left) TRACHELECTOMY (N/A) CYSTOSCOPY  Subjective: Patient reports tolerating PO and + flatus. Has not yet ambulated or voided without catheter.    Objective: I have reviewed patient's vital signs, intake and output, medications and labs.  Blood pressure 106/59, pulse 71, temperature 98.2 F (36.8 C), temperature source Oral, resp. rate 18, SpO2 95 %.  General: alert and no distress Resp: clear to auscultation bilaterally Cardio: regular rate and rhythm, S1, S2 normal, no murmur, click, rub or gallop GI: soft, non-tender; bowel sounds normal; no masses,  no organomegaly Extremities: extremities normal, atraumatic, no cyanosis or edema Vaginal Bleeding: minimal  Assessment: s/p Procedure(s): LAPAROSCOPIC OOPHORECTOMY (Left) TRACHELECTOMY (N/A) CYSTOSCOPY: stable, progressing well and tolerating diet  Plan: Advance diet Encourage ambulation Continue PO medications. Discharge home today.      Lisa Fuentes 11/16/2014, 7:54 AM

## 2014-11-16 NOTE — Plan of Care (Signed)
Problem: Phase I Progression Outcomes Goal: Dangle/OOB as tolerated per MD order Outcome: Completed/Met Date Met:  11/16/14 Walked around nursing station twice

## 2014-11-16 NOTE — Discharge Instructions (Signed)
General Gynecological Post-Operative Instructions You may expect to feel dizzy, weak, and drowsy for as long as 24 hours after receiving the medicine that made you sleep (anesthetic).  Do not drive a car, ride a bicycle, participate in physical activities, or take public transportation until you are done taking narcotic pain medicines or as directed by your doctor.  Do not drink alcohol or take tranquilizers.  Do not take medicine that has not been prescribed by your doctor.  Do not sign important papers or make important decisions while on narcotic pain medicines.  Have a responsible person with you.  CARE OF INCISION  Keep incision clean and dry. Take showers instead of baths until your doctor gives you permission to take baths.  Avoid heavy lifting (more than 10 pounds/4.5 kilograms), pushing, or pulling.  Avoid activities that may risk injury to your surgical site.  No sexual intercourse or placement of anything in the vagina for 6 weeks or as instructed by your doctor. Only take prescription or over-the-counter medicines  for pain, discomfort, or fever as directed by your doctor. Do not take aspirin. It can make you bleed. Take medicines (antibiotics) that kill germs if they are prescribed for you.  Call the office or go to the Emergency Room if:  You feel sick to your stomach (nauseous).  You start to throw up (vomit).  You have trouble eating or drinking.  You have an oral temperature above 101.  You have constipation that is not helped by adjusting diet or increasing fluid intake. Pain medicines are a common cause of constipation.  You have any other concerns. SEEK IMMEDIATE MEDICAL CARE IF:  You have persistent dizziness.  You have difficulty breathing or a congested sounding (croupy) cough.  You have an oral temperature above 102.5, not controlled by medicine.  There is increasing pain or tenderness near or in the surgical site.

## 2014-11-16 NOTE — Discharge Summary (Signed)
Gynecology Physician Postoperative Discharge Summary  Patient ID: Lisa Fuentes MRN: [id number] DOB/AGE: 52-Jan-1962 52 y.o.  Admit Date: 11/15/2014 Discharge Date: 11/16/2014  Preoperative Diagnoses: Pelvic pain, right ovarian cyst, cervical cyst, h/o endometriosis  Procedures: Procedure(s) (LRB): LAPAROSCOPIC OOPHORECTOMY (Left) TRACHELECTOMY (N/A) CYSTOSCOPY  Hospital Course:  Lisa Fuentes is a 54 y.o. G3P0  admitted for scheduled surgery.  She underwent the procedures as mentioned above, her operation was uncomplicated. For further details about surgery, please refer to the operative report. Patient had an uncomplicated postoperative course. By time of discharge on POD#1, her pain was controlled on oral pain medications; she was ambulating, voiding without difficulty, tolerating regular diet and passing flatus. She was deemed stable for discharge to home.   Significant Labs: CBC Latest Ref Rng 11/16/2014 11/12/2014 10/06/2013  WBC 3.6 - 11.0 K/uL 10.7 4.0 -  Hemoglobin 12.0 - 16.0 g/dL 11.7(L) 13.4 13.0  Hematocrit 35.0 - 47.0 % 34.2(L) 39.4 -  Platelets 150 - 440 K/uL 138(L) 159 -    Discharge Exam: Blood pressure 106/59, pulse 71, temperature 98.2 F (36.8 C), temperature source Oral, resp. rate 18, SpO2 95 %. General appearance: alert and no distress  Resp: clear to auscultation bilaterally  Cardio: regular rate and rhythm  GI: soft, non-tender; bowel sounds normal; no masses, no organomegaly.  Incision: none Pelvic: scant blood on pad  Extremities: extremities normal, atraumatic, no cyanosis or edema and Homans sign is negative, no sign of DVT  Discharged Condition: Stable  Disposition: 01-Home or Self Care     Medication List    STOP taking these medications        acetaminophen-codeine 300-30 MG tablet  Commonly known as:  TYLENOL #3      TAKE these medications        ALPRAZolam 0.25 MG tablet  Commonly known as:  XANAX  Take 0.25 mg by mouth daily.  As needed     aspirin 81 MG tablet  Take 81 mg by mouth daily.     carvedilol 25 MG tablet  Commonly known as:  COREG  Take 1/2 tablet (12.5 mg) by mouth twice daily     citalopram 40 MG tablet  Commonly known as:  CELEXA  TAKE ONE TABLET BY MOUTH ONCE DAILY     docusate sodium 100 MG capsule  Commonly known as:  COLACE  Take 1 capsule (100 mg total) by mouth 2 (two) times daily as needed for mild constipation.     furosemide 40 MG tablet  Commonly known as:  LASIX  TAKE ONE TABLET BY MOUTH ONCE DAILY *PATIENT OCCASIONALLY TAKES AN EXTRA TABLET*     ibuprofen 800 MG tablet  Commonly known as:  ADVIL,MOTRIN  Take 1 tablet (800 mg total) by mouth every 6 (six) hours as needed for mild pain or cramping (mild pain).     levothyroxine 125 MCG tablet  Commonly known as:  SYNTHROID, LEVOTHROID  Take 150 mcg by mouth daily before breakfast.     lisinopril 5 MG tablet  Commonly known as:  PRINIVIL,ZESTRIL  TAKE ONE TABLET BY MOUTH ONCE DAILY     loratadine 10 MG tablet  Commonly known as:  CLARITIN  Take 10 mg by mouth daily.     oxyCODONE-acetaminophen 5-325 MG tablet  Commonly known as:  PERCOCET/ROXICET  Take 1-2 tablets by mouth every 6 (six) hours as needed for moderate pain or severe pain (moderate to severe pain (when tolerating fluids)).     pantoprazole 40 MG tablet  Commonly known as:  PROTONIX  Take 40 mg by mouth daily.     simethicone 80 MG chewable tablet  Commonly known as:  MYLICON  Chew 1 tablet (80 mg total) by mouth 4 (four) times daily as needed for flatulence.     spironolactone 25 MG tablet  Commonly known as:  ALDACTONE  Take 0.5 tablets (12.5 mg total) by mouth daily. TAKE ONE-HALF TABLET BY MOUTH ONCE DAILY           Follow-up Information    Follow up with Rubie Maid, MD In 1 week.   Specialties:  Obstetrics and Gynecology, Radiology   Why:  For wound re-check   Contact information:   [address] [place]  [address] [phone]       Signed:  Rubie Maid, MD [facility]

## 2014-11-16 NOTE — Progress Notes (Addendum)
Patient alert and oriented.  VSS.  Held BP meds and diuretics this AM per patient request because blood pressure "wasn't high enough" per patient.  Given d/c instructions about wound care and follow up with MD.  Surgical site dry and intact.  No s/s of infection.  Patient able to repeat back return demonstration of s/s of infection including temp, color, smell, drainage.    PIV removed and fluids stopped.  Given Rx for pain medication, stool softener.  No questions at this time.    Patient has voided s/p catheter removal and has ambulated several times in room and around nursing station.  States she is ready for discharge.      Escorted out of hospital via nursing.

## 2014-11-17 LAB — SURGICAL PATHOLOGY

## 2014-11-24 ENCOUNTER — Encounter: Payer: Medicare Other | Admitting: Obstetrics and Gynecology

## 2014-11-24 ENCOUNTER — Ambulatory Visit (INDEPENDENT_AMBULATORY_CARE_PROVIDER_SITE_OTHER): Payer: Medicare Other | Admitting: Obstetrics and Gynecology

## 2014-11-24 ENCOUNTER — Encounter: Payer: Self-pay | Admitting: Obstetrics and Gynecology

## 2014-11-24 VITALS — BP 109/74 | HR 78 | Resp 16 | Ht 67.0 in | Wt 244.1 lb

## 2014-11-24 DIAGNOSIS — E890 Postprocedural hypothyroidism: Secondary | ICD-10-CM

## 2014-11-24 DIAGNOSIS — Z9889 Other specified postprocedural states: Secondary | ICD-10-CM

## 2014-11-24 DIAGNOSIS — Z90710 Acquired absence of both cervix and uterus: Secondary | ICD-10-CM

## 2014-11-24 NOTE — Progress Notes (Signed)
GYNECOLOGY CLINIC PROGRESS NOTE  Subjective:     Lisa Fuentes is a 54 y.o. G55P3003 female who presents to the clinic 1 weeks status post laparoscopic left salpingo-oophorectomy and vaginal trachelectomy and cystoscopy for pelvic pain and h/o endometriosis, and left ovarian cyst. Eating a regular diet without difficulty. Bowel movements are normal. Pain is controlled with current analgesics. Medications being used: prescription NSAID's including ibuprofen (Motrin) and narcotic analgesics including oxycodone/acetaminophen (Percocet, Tylox).  The following portions of the patient's history were reviewed and updated as appropriate: allergies, current medications, past family history, past medical history, past social history, past surgical history and problem list.   Review of Systems A comprehensive review of systems was negative except for: Cardiovascular: positive for lower extremity edema Gastrointestinal: positive for bloating Genitourinary: positive for vaginal bleeding (light), tapering off    Objective:    BP 109/74 mmHg  Pulse 78  Resp 16  Ht 5\' 7"  (1.702 m)  Wt 244 lb 1.6 oz (110.723 kg)  BMI 38.22 kg/m2 General:  alert and no distress  Abdomen: soft, bowel sounds active, non-tender  Incision:   healing well, no drainage, no erythema, no hernia, no seroma, no swelling, no dehiscence, incision well approximated  Pelvis:  deferred     Assessment:    Doing well postoperatively.  Hypothyroidism (uncontrolled)  Plan:    1. Continue any current medications. 2. Wound care discussed. 3. Activity restrictions: no bending, stooping, or squatting, no lifting more than 15 pounds and pelvic rest x 5 weeks 4. Anticipated return to work: 4 weeks.  5. Follow up: 5 weeks for final post-op check.  Will discuss pathology at that time.  6.  Has appointment with endocrionologist on Monday, 11/29/14.  Rubie Maid, MD [facility]

## 2014-11-30 ENCOUNTER — Telehealth: Payer: Self-pay | Admitting: Obstetrics and Gynecology

## 2014-11-30 NOTE — Telephone Encounter (Signed)
Pt had surgery 10/17 and Dr. Marcelline Mates told her she would bleed a couple days. It kinda dried up but she is bleeding again and crampy.

## 2014-12-01 NOTE — Telephone Encounter (Signed)
Called pt. Lm for pt to call back.

## 2014-12-01 NOTE — Telephone Encounter (Signed)
Spoke with pt she states that she is having some post op bleeding. Pt states that bleeding is not heavy. Pt states that yesterday she had to where a pad. Pt states that she has not been complying with directions to rest and stay off her feet. Advised pt that some bleeding is to be expected, as long as the bleeding is not heavy (soaking through a pad in 48mins). Pt state she understands and that today bleeding is very minimal. Advised pt to call back if anything changes. Advised pt to rest as much as possible.

## 2014-12-07 ENCOUNTER — Telehealth: Payer: Self-pay | Admitting: Obstetrics and Gynecology

## 2014-12-07 ENCOUNTER — Ambulatory Visit: Payer: Medicare Other | Attending: Otolaryngology

## 2014-12-07 DIAGNOSIS — G4733 Obstructive sleep apnea (adult) (pediatric): Secondary | ICD-10-CM | POA: Insufficient documentation

## 2014-12-07 DIAGNOSIS — R0683 Snoring: Secondary | ICD-10-CM | POA: Diagnosis not present

## 2014-12-07 NOTE — Telephone Encounter (Signed)
Called pt she states that she is having a lot of pain and and also experience a large "gush" of fluid. This is the second time patient has called in 2 weeks with complaints of pain, advised pt that she needs to be seen and evaluated. Pt added to schedule at 11:00am.

## 2014-12-07 NOTE — Telephone Encounter (Signed)
HAD A GUSH OF FLUID, THEN SAT ON COMMODE AND WAS DOUBLED OVER WITH A PAIN. SHE IS LYING DOWN NOW

## 2014-12-09 ENCOUNTER — Encounter: Payer: Self-pay | Admitting: *Deleted

## 2014-12-09 ENCOUNTER — Emergency Department
Admission: EM | Admit: 2014-12-09 | Discharge: 2014-12-09 | Disposition: A | Discharge status: Home / Self Care | Payer: Medicare Other | Attending: Emergency Medicine | Admitting: Emergency Medicine

## 2014-12-09 ENCOUNTER — Emergency Department: Payer: Medicare Other

## 2014-12-09 DIAGNOSIS — R110 Nausea: Secondary | ICD-10-CM | POA: Insufficient documentation

## 2014-12-09 DIAGNOSIS — Z7982 Long term (current) use of aspirin: Secondary | ICD-10-CM | POA: Insufficient documentation

## 2014-12-09 DIAGNOSIS — I5022 Chronic systolic (congestive) heart failure: Secondary | ICD-10-CM | POA: Diagnosis not present

## 2014-12-09 DIAGNOSIS — R109 Unspecified abdominal pain: Secondary | ICD-10-CM

## 2014-12-09 DIAGNOSIS — Z95810 Presence of automatic (implantable) cardiac defibrillator: Secondary | ICD-10-CM | POA: Diagnosis not present

## 2014-12-09 DIAGNOSIS — Z79899 Other long term (current) drug therapy: Secondary | ICD-10-CM | POA: Diagnosis not present

## 2014-12-09 DIAGNOSIS — N390 Urinary tract infection, site not specified: Secondary | ICD-10-CM | POA: Diagnosis not present

## 2014-12-09 DIAGNOSIS — I10 Essential (primary) hypertension: Secondary | ICD-10-CM | POA: Diagnosis not present

## 2014-12-09 DIAGNOSIS — E669 Obesity, unspecified: Secondary | ICD-10-CM | POA: Diagnosis not present

## 2014-12-09 HISTORY — DX: Heart failure, unspecified: I50.9

## 2014-12-09 LAB — URINALYSIS COMPLETE WITH MICROSCOPIC (ARMC ONLY)
BACTERIA UA: NONE SEEN
Bilirubin Urine: NEGATIVE
Glucose, UA: NEGATIVE mg/dL
HGB URINE DIPSTICK: NEGATIVE
Nitrite: NEGATIVE
PH: 5 (ref 5.0–8.0)
PROTEIN: NEGATIVE mg/dL
Specific Gravity, Urine: 1.031 — ABNORMAL HIGH (ref 1.005–1.030)

## 2014-12-09 LAB — CBC
HEMATOCRIT: 39.6 % (ref 35.0–47.0)
Hemoglobin: 13 g/dL (ref 12.0–16.0)
MCH: 30.7 pg (ref 26.0–34.0)
MCHC: 32.8 g/dL (ref 32.0–36.0)
MCV: 93.7 fL (ref 80.0–100.0)
PLATELETS: 197 10*3/uL (ref 150–440)
RBC: 4.22 MIL/uL (ref 3.80–5.20)
RDW: 13.5 % (ref 11.5–14.5)
WBC: 4.8 10*3/uL (ref 3.6–11.0)

## 2014-12-09 LAB — COMPREHENSIVE METABOLIC PANEL
ALBUMIN: 4.5 g/dL (ref 3.5–5.0)
ALT: 14 U/L (ref 14–54)
AST: 20 U/L (ref 15–41)
Alkaline Phosphatase: 67 U/L (ref 38–126)
Anion gap: 6 (ref 5–15)
BILIRUBIN TOTAL: 0.5 mg/dL (ref 0.3–1.2)
BUN: 19 mg/dL (ref 6–20)
CHLORIDE: 105 mmol/L (ref 101–111)
CO2: 27 mmol/L (ref 22–32)
CREATININE: 0.81 mg/dL (ref 0.44–1.00)
Calcium: 9.6 mg/dL (ref 8.9–10.3)
GFR calc Af Amer: >60 mL/min (ref >60)
GLUCOSE: 135 mg/dL — AB (ref 65–99)
POTASSIUM: 3.8 mmol/L (ref 3.5–5.1)
Sodium: 138 mmol/L (ref 135–145)
Total Protein: 7.8 g/dL (ref 6.5–8.1)

## 2014-12-09 LAB — LIPASE, BLOOD: LIPASE: 29 U/L (ref 11–51)

## 2014-12-09 MED ORDER — SODIUM CHLORIDE 0.9 % IV BOLUS (SEPSIS)
500.0000 mL | INTRAVENOUS | Status: AC
  Administered 2014-12-09: 500 mL via INTRAVENOUS

## 2014-12-09 MED ORDER — IOHEXOL 240 MG/ML SOLN
25.0000 mL | INTRAMUSCULAR | Status: AC
  Administered 2014-12-09 (×2): 25 mL via ORAL
  Filled 2014-12-09 (×2): qty 25

## 2014-12-09 MED ORDER — IOHEXOL 300 MG/ML  SOLN
100.0000 mL | Freq: Once | INTRAMUSCULAR | Status: AC | PRN
  Administered 2014-12-09: 100 mL via INTRAVENOUS
  Filled 2014-12-09: qty 100

## 2014-12-09 MED ORDER — CEPHALEXIN 500 MG PO CAPS: 500.0000 mg | ORAL_CAPSULE | Freq: Two times a day (BID) | ORAL | Status: DC

## 2014-12-09 NOTE — ED Notes (Signed)
Pt reports right lower quad pain since Monday, pt denies any other symptoms

## 2014-12-09 NOTE — Discharge Instructions (Signed)
You have been seen in the Emergency Department (ED) for abdominal pain.  Your evaluation did not identify a clear cause of your symptoms but was generally reassuring. ° °Please follow up as instructed above regarding today’s emergent visit and the symptoms that are bothering you. ° °Return to the ED if your abdominal pain worsens or fails to improve, you develop bloody vomiting, bloody diarrhea, you are unable to tolerate fluids due to vomiting, fever greater than 101, or other symptoms that concern you. ° ° °Abdominal Pain, Adult °Many things can cause abdominal pain. Usually, abdominal pain is not caused by a disease and will improve without treatment. It can often be observed and treated at home. Your health care provider will do a physical exam and possibly order blood tests and X-rays to help determine the seriousness of your pain. However, in many cases, more time must pass before a clear cause of the pain can be found. Before that point, your health care provider may not know if you need more testing or further treatment. °HOME CARE INSTRUCTIONS °Monitor your abdominal pain for any changes. The following actions may help to alleviate any discomfort you are experiencing: °· Only take over-the-counter or prescription medicines as directed by your health care provider. °· Do not take laxatives unless directed to do so by your health care provider. °· Try a clear liquid diet (broth, tea, or water) as directed by your health care provider. Slowly move to a bland diet as tolerated. °SEEK MEDICAL CARE IF: °· You have unexplained abdominal pain. °· You have abdominal pain associated with nausea or diarrhea. °· You have pain when you urinate or have a bowel movement. °· You experience abdominal pain that wakes you in the night. °· You have abdominal pain that is worsened or improved by eating food. °· You have abdominal pain that is worsened with eating fatty foods. °· You have a fever. °SEEK IMMEDIATE MEDICAL CARE  IF: °· Your pain does not go away within 2 hours. °· You keep throwing up (vomiting). °· Your pain is felt only in portions of the abdomen, such as the right side or the left lower portion of the abdomen. °· You pass bloody or black tarry stools. °MAKE SURE YOU: °· Understand these instructions. °· Will watch your condition. °· Will get help right away if you are not doing well or get worse. °  °This information is not intended to replace advice given to you by your health care provider. Make sure you discuss any questions you have with your health care provider. °  °Document Released: 10/25/2004 Document Revised: 10/06/2014 Document Reviewed: 09/24/2012 °Elsevier Interactive Patient Education ©2016 [employer] ° °

## 2014-12-09 NOTE — ED Provider Notes (Signed)
[facility] Emergency Department Provider Note  ____________________________________________  Time seen: Approximately 2:01 PM  I have reviewed the triage vital signs and the nursing notes.   HISTORY  Chief Complaint Abdominal Pain    HPI Lisa Fuentes is a 54 y.o. female with a history that includes total abdominal hysterectomy and salpingo-oophorectomy but he still has her appendix and gallbladder presents with approximately 4 days of abdominal pain that is primarily on the right side of her abdomen.  She states that she had a acute onset of severe right-sided abdominal pain about 4 days ago that "bent me over" and was severe.  It got better but has continued to wax and wane in intensity from mild to severe of the last 4 days.  Nothing specific seems to make it better or worse.  It is been accompanied with some nausea but no vomiting.  She denies any bowel changes.  She denies dysuria.  She does state that when the initial severe episode happened she had a "gush" of clear fluid that she thinks was from her vagina because it did not smell like urine, but she does acknowledge that she has had hysterectomy and had her cervix removed.  She has had no other vaginal complaints.Denies fever/chills, chest pain, shortness of breath.  She has a history of clotting disorder and cardiomyopathy and has an implantable defibrillator.   Past Medical History  Diagnosis Date  . Thyroid disease   . Menopausal symptoms   . Anemia   . Tachycardia   . Hypertension   . Hypokalemia   . Clotting disorder ([place])   . Nonischemic cardiomyopathy (HCC)     Interval improvement EF 40-45% 1/16  . Sleep apnea     Treated with CPAP  . Implantable defibrillator     [employer] DOI 2008, replacement 2015  . Hypercholesteremia   . Endometriosis of vagina 09/2013    Intra-operative findings of endometriosis implants on cervical stump  . Coronary artery disease   . Dysrhythmia   . Shortness  of breath dyspnea     chronic doe  . Hypothyroidism   . Depression   . Anxiety     panic attacks  . Headache   . Arthritis   . Difficult intubation   . CHF (congestive heart failure) [facility])     Patient Active Problem List   Diagnosis Date Noted  . Postoperative state 11/15/2014  . Left ventricular dysfunction 10/29/2014  . Endometriosis 09/18/2014  . ICD (implantable cardioverter-defibrillator) in place 02/02/2014  . Chronic systolic heart failure ([place])   . Essential hypertension   . Hypercholesteremia   . Apnea, sleep 05/12/2013  . Hyperthyroidism 05/08/2013    Past Surgical History  Procedure Laterality Date  . Cardiac defibrillator placement    . Total abdominal hysterectomy    . Cardiac catheterization  2003    [facility]: No significant coronary artery disease with reduced ejection fraction.  . Tonsillectomy    . Foot surgery    . Colonoscopy  2015  . Tubal ligation Bilateral   . Laparoscopic salpingoopherectomy Right 09/2013    also with left salpingectomy  . Coronary angioplasty    . Hand surgery    . Laparoscopic salpingo oopherectomy Left 11/15/2014    Procedure: LAPAROSCOPIC OOPHORECTOMY;  Surgeon: Rubie Maid, MD;  Location: [facility] ORS;  Service: Gynecology;  Laterality: Left;  . Trachelectomy N/A 11/15/2014    Procedure: TRACHELECTOMY;  Surgeon: Rubie Maid, MD;  Location: [facility] ORS;  Service: Gynecology;  Laterality:  N/A;  . Cystoscopy  11/15/2014    Procedure: CYSTOSCOPY;  Surgeon: Rubie Maid, MD;  Location: [facility] ORS;  Service: Gynecology;;    Current Outpatient Rx  Name  Route  Sig  Dispense  Refill  . acetaminophen-codeine (TYLENOL #3) 300-30 MG tablet               . ALPRAZolam (XANAX) 0.25 MG tablet   Oral   Take 0.25 mg by mouth daily. As needed         . aspirin 81 MG tablet   Oral   Take 81 mg by mouth daily.         . carvedilol (COREG) 25 MG tablet      Take 1/2 tablet (12.5 mg) by mouth twice daily         . cephALEXin (KEFLEX)  500 MG capsule   Oral   Take 1 capsule (500 mg total) by mouth 2 (two) times daily.   14 capsule   0   . citalopram (CELEXA) 40 MG tablet      TAKE ONE TABLET BY MOUTH ONCE DAILY         . docusate sodium (COLACE) 100 MG capsule   Oral   Take 1 capsule (100 mg total) by mouth 2 (two) times daily as needed for mild constipation.   30 capsule   1   . furosemide (LASIX) 40 MG tablet      TAKE ONE TABLET BY MOUTH ONCE DAILY *PATIENT OCCASIONALLY TAKES AN EXTRA TABLET*         . ibuprofen (ADVIL,MOTRIN) 800 MG tablet   Oral   Take 1 tablet (800 mg total) by mouth every 6 (six) hours as needed for mild pain or cramping (mild pain).   30 tablet   0   . levothyroxine (SYNTHROID, LEVOTHROID) 125 MCG tablet   Oral   Take 150 mcg by mouth daily before breakfast.          . lisinopril (PRINIVIL,ZESTRIL) 5 MG tablet      TAKE ONE TABLET BY MOUTH ONCE DAILY   30 tablet   3   . loratadine (CLARITIN) 10 MG tablet   Oral   Take 10 mg by mouth daily.         . meloxicam (MOBIC) 7.5 MG tablet               . oxyCODONE-acetaminophen (PERCOCET/ROXICET) 5-325 MG tablet   Oral   Take 1-2 tablets by mouth every 6 (six) hours as needed for moderate pain or severe pain (moderate to severe pain (when tolerating fluids)).   30 tablet   0   . pantoprazole (PROTONIX) 40 MG tablet   Oral   Take 40 mg by mouth daily.         . simethicone (MYLICON) 80 MG chewable tablet   Oral   Chew 1 tablet (80 mg total) by mouth 4 (four) times daily as needed for flatulence.   30 tablet   0   . spironolactone (ALDACTONE) 25 MG tablet   Oral   Take 0.5 tablets (12.5 mg total) by mouth daily. TAKE ONE-HALF TABLET BY MOUTH ONCE DAILY   90 tablet   3     Allergies Sulfamethoxazole-trimethoprim  Family History  Problem Relation Age of Onset  . Hypertension Mother   . Hyperlipidemia Mother   . Stroke Mother   . Colon cancer Mother     Social History Social History  Substance  Use Topics  .  Smoking status: Never Smoker   . Smokeless tobacco: Never Used  . Alcohol Use: 0.0 oz/week    0 Standard drinks or equivalent per week     Comment: 5 q week    Review of Systems Constitutional: No fever/chills Eyes: No visual changes. ENT: No sore throat. Cardiovascular: Denies chest pain. Respiratory: Denies shortness of breath. Gastrointestinal: Intermittent mild to severe right-sided abdominal pain with nausea but no vomiting.  No diarrhea.  No constipation. Genitourinary: Negative for dysuria.  One episode of possible urinary incontinence Musculoskeletal: Negative for back pain. Skin: Negative for rash. Neurological: Negative for headaches, focal weakness or numbness.  10-point ROS otherwise negative.  ____________________________________________   PHYSICAL EXAM:  VITAL SIGNS: ED Triage Vitals  Enc Vitals Group     BP 12/09/14 1101 120/76 mmHg     Pulse Rate 12/09/14 1101 86     Resp 12/09/14 1101 20     Temp 12/09/14 1101 98 F (36.7 C)     Temp Source 12/09/14 1101 Oral     SpO2 12/09/14 1101 97 %     Weight 12/09/14 1101 245 lb (111.131 kg)     Height 12/09/14 1101 5\' 7"  (1.702 m)     Head Cir --      Peak Flow --      Pain Score 12/09/14 1102 4     Pain Loc --      Pain Edu? --      Excl. in [place]? --     Constitutional: Alert and oriented. Well appearing and in no acute distress. Eyes: Conjunctivae are normal. PERRL. EOMI. Head: Atraumatic. Nose: No congestion/rhinnorhea. Mouth/Throat: Mucous membranes are moist.  Oropharynx non-erythematous. Neck: No stridor.   Cardiovascular: Normal rate, regular rhythm. Grossly normal heart sounds.  Good peripheral circulation. Respiratory: Normal respiratory effort.  No retractions. Lungs CTAB. Gastrointestinal: Obese, Soft.  Moderate tenderness to palpation with voluntary guarding of the right side of the abdomen.  No pain at McBurney's point and right upper quadrant pain (negative Murphy  sign). Genitourinary: Offered but deferred at the patient's request Musculoskeletal: No lower extremity tenderness nor edema.  No joint effusions. Neurologic:  Normal speech and language. No gross focal neurologic deficits are appreciated.  Skin:  Skin is warm, dry and intact. No rash noted. Psychiatric: Mood and affect are normal. Speech and behavior are normal.  ____________________________________________   LABS (all labs ordered are listed, but only abnormal results are displayed)  Labs Reviewed  COMPREHENSIVE METABOLIC PANEL - Abnormal; Notable for the following:    Glucose, Bld 135 (*)    All other components within normal limits  URINALYSIS COMPLETEWITH MICROSCOPIC ([facility] ONLY) - Abnormal; Notable for the following:    Color, Urine YELLOW (*)    APPearance CLEAR (*)    Ketones, ur TRACE (*)    Specific Gravity, Urine 1.031 (*)    Leukocytes, UA 2+ (*)    Squamous Epithelial / LPF 0-5 (*)    All other components within normal limits  URINE CULTURE  LIPASE, BLOOD  CBC   ____________________________________________  EKG  Not indicated ____________________________________________  RADIOLOGY   Ct Abdomen Pelvis W Contrast  12/09/2014  CLINICAL DATA:  RIGHT lower quadrant pain since Monday. EXAM: CT ABDOMEN AND PELVIS WITH CONTRAST TECHNIQUE: Multidetector CT imaging of the abdomen and pelvis was performed using the standard protocol following bolus administration of intravenous contrast. CONTRAST:  129mL OMNIPAQUE IOHEXOL 300 MG/ML  SOLN COMPARISON:  None. FINDINGS: Lower chest: Lung bases are clear. Hepatobiliary:  No focal hepatic lesion. No biliary duct dilatation. Gallbladder is normal. Common bile duct is normal. Pancreas: Pancreas is normal. No ductal dilatation. No pancreatic inflammation. Spleen: Normal spleen Adrenals/urinary tract: Adrenal glands and kidneys are normal. The ureters and bladder normal. Stomach/Bowel: The stomach, small bowel, cecum are normal.  Appendix not identified. No pericecal inflammation. Moderate volume of stool throughout the colon. Vascular/Lymphatic: Abdominal aorta is normal caliber. There is no retroperitoneal or periportal lymphadenopathy. No pelvic lymphadenopathy. Reproductive: Post hysterectomy anatomy. Other: No free fluid. Musculoskeletal: No aggressive osseous lesion. IMPRESSION: 1. No explanation for RIGHT lower quadrant pain. 2. Appendix not identified but there are no secondary signs of appendicitis. 3. Moderate volume stool colon. 4. Hysterectomy. 5. No obstructive uropathy. Electronically Signed   By: Suzy Bouchard M.D.   On: 12/09/2014 16:44    ____________________________________________   PROCEDURES  Procedure(s) performed: None  Critical Care performed: No ____________________________________________   INITIAL IMPRESSION / ASSESSMENT AND PLAN / ED COURSE  Pertinent labs & imaging results that were available during my care of the patient were reviewed by me and considered in my medical decision making (see chart for details).  The patient's vital signs and labs are reassuring, but I do not have a good explanation for her symptoms.  She does have at least moderate tenderness to palpation with some guarding on the right, and her last abdominal surgery was less than 1 month ago.  Given the possibility of a fluid collection, abscess, or new problem altogether that is causing her discomfort, I will evaluate with CT scan.  I discussed this with the patient and she understands and agrees with the plan.  ----------------------------------------- 4:59 PM on 12/09/2014 -----------------------------------------  The patient is comfortable in bed at this time.  Her CT scan and labs were unremarkable except for a mild UTI.  I have ordered a urine culture and will treat her with Keflex.  I updated her and she is ready to go home.  She again declined a pelvic exam.  She understands my usual and customary return  precautions.    ____________________________________________  FINAL CLINICAL IMPRESSION(S) / ED DIAGNOSES  Final diagnoses:  Right sided abdominal pain  ICD (implantable cardioverter-defibrillator) in place  Chronic systolic heart failure (HCC)  Essential hypertension  UTI (urinary tract infection), uncomplicated      NEW MEDICATIONS STARTED DURING THIS VISIT:  New Prescriptions   CEPHALEXIN (KEFLEX) 500 MG CAPSULE    Take 1 capsule (500 mg total) by mouth 2 (two) times daily.     Hinda Kehr, MD 12/09/14 [phone]

## 2014-12-11 LAB — URINE CULTURE: Special Requests: NORMAL

## 2014-12-17 ENCOUNTER — Institutional Professional Consult (permissible substitution): Payer: Medicare Other | Admitting: Internal Medicine

## 2014-12-18 ENCOUNTER — Ambulatory Visit: Payer: Medicare Other | Attending: Otolaryngology

## 2014-12-18 DIAGNOSIS — G4733 Obstructive sleep apnea (adult) (pediatric): Secondary | ICD-10-CM | POA: Insufficient documentation

## 2014-12-18 DIAGNOSIS — R0683 Snoring: Secondary | ICD-10-CM | POA: Insufficient documentation

## 2014-12-28 ENCOUNTER — Other Ambulatory Visit: Payer: Self-pay | Admitting: Otolaryngology

## 2014-12-28 DIAGNOSIS — E041 Nontoxic single thyroid nodule: Secondary | ICD-10-CM

## 2014-12-29 ENCOUNTER — Encounter: Payer: Self-pay | Admitting: Obstetrics and Gynecology

## 2014-12-29 ENCOUNTER — Ambulatory Visit (INDEPENDENT_AMBULATORY_CARE_PROVIDER_SITE_OTHER): Payer: Medicare Other | Admitting: Obstetrics and Gynecology

## 2014-12-29 VITALS — BP 112/79 | HR 82 | Ht 67.0 in | Wt 249.2 lb

## 2014-12-29 DIAGNOSIS — Z90721 Acquired absence of ovaries, unilateral: Secondary | ICD-10-CM

## 2014-12-29 DIAGNOSIS — Z90710 Acquired absence of both cervix and uterus: Secondary | ICD-10-CM

## 2014-12-29 DIAGNOSIS — Z9889 Other specified postprocedural states: Secondary | ICD-10-CM

## 2014-12-29 MED ORDER — GABAPENTIN 300 MG PO CAPS: 300.0000 mg | ORAL_CAPSULE | Freq: Three times a day (TID) | ORAL | Status: DC

## 2014-12-31 ENCOUNTER — Ambulatory Visit: Payer: Medicare Other

## 2014-12-31 NOTE — Progress Notes (Signed)
GYNECOLOGY CLINIC PROGRESS NOTE  Subjective:     Lisa Fuentes is a 54 y.o. [id number] female who presents to the clinic 6 weeks status post laparoscopic left salpingo-oophorectomy and vaginal trachelectomy and cystoscopy for pelvic pain and h/o endometriosis, and left ovarian cyst. Eating a regular diet without difficulty. Bowel movements are normal. The patient is not having any pain.    The following portions of the patient's history were reviewed and updated as appropriate: allergies, current medications, past family history, past medical history, past social history, past surgical history and problem list.   Review of Systems A comprehensive review of systems was negative except for: Genitourinary: positive for still notes intermittent spotting/light bleeding Musculoskeletal: positive for bilateral leg pain from hips to knees, sharp shooting, tingling    Objective:    BP 112/79 mmHg  Pulse 82  Ht 5\' 7"  (1.702 m)  Wt 249 lb 3.2 oz (113.036 kg)  BMI 39.02 kg/m2 General:  alert and no distress  Abdomen: soft, bowel sounds active, non-tender  Incision:   incisions well healed, well approximated  Pelvis:  external genitalia normal without lesions, vagina with cuff well healed, non-tender. Area of granulation tissue noted around segment of residual stitch in right apex of vaginal cuff.  Residual suture material removed.   Extremities: extremities normal, atraumatic, no cyanosis or edema    Neurologic:  Alert and oriented X 3, normal strength and tone. Normal symmetric reflexes. Normal coordination and gait        Pathology 11/15/2014:  A. CERVIX; TRACHELECTOMY:  - NABOTHIAN (RETENTION) CYST, 2.3 CM.  - NEGATIVE FOR ENDOMETRIOSIS, ATYPIA AND MALIGNANCY.   B. LEFT OVARY; OOPHORECTOMY:  - FOLLICLE CYST, 2.6 CM.  - NEGATIVE FOR ENDOMETRIOSIS, ATYPIA AND MALIGNANCY.   Assessment:    Postoperative course complicated by bilateral leg pain   Plan:   1. Continue any current  medications prn. 2. Leg pain (bilateral) possibly due to nerve compression while in stirrups during surgery.  No sensory or motor deficits noted.  Likely neuropathic.  Has been taking NSAIDs with no relief.  Prescribed Gabapentin 300 mg BID-QID for several weeks.  Advised that symptoms were likely transient.  To f/u if symptoms do not improve or worsen.  3. Activity restrictions: pelvic rest x 1 additional week 4. Anticipated return to work: now.  5. Pathology reviewed with patient.    Rubie Maid, MD [facility]

## 2015-01-07 ENCOUNTER — Ambulatory Visit
Admission: RE | Admit: 2015-01-07 | Discharge: 2015-01-07 | Disposition: A | Discharge status: Home / Self Care | Payer: Medicare Other | Source: Ambulatory Visit | Attending: Otolaryngology | Admitting: Otolaryngology

## 2015-01-07 DIAGNOSIS — E041 Nontoxic single thyroid nodule: Secondary | ICD-10-CM | POA: Diagnosis present

## 2015-01-10 ENCOUNTER — Telehealth: Payer: Self-pay | Admitting: Obstetrics and Gynecology

## 2015-01-10 NOTE — Telephone Encounter (Signed)
Patient called stating she is still in a lot of pain. She is having a lot of pain in her legs. She had surgery on 11/15/14.

## 2015-01-10 NOTE — Telephone Encounter (Signed)
Pt states she is still having leg pain. NO better with gabapentin. Appt made for 12/13 at 1:30.

## 2015-01-11 ENCOUNTER — Ambulatory Visit (INDEPENDENT_AMBULATORY_CARE_PROVIDER_SITE_OTHER): Payer: Medicare Other | Admitting: Obstetrics and Gynecology

## 2015-01-11 ENCOUNTER — Other Ambulatory Visit: Payer: Self-pay

## 2015-01-11 VITALS — BP 105/70 | HR 83 | Ht 67.0 in | Wt 253.2 lb

## 2015-01-11 DIAGNOSIS — M792 Neuralgia and neuritis, unspecified: Secondary | ICD-10-CM | POA: Diagnosis not present

## 2015-01-11 MED ORDER — AMITRIPTYLINE HCL 50 MG PO TABS: 50.0000 mg | ORAL_TABLET | Freq: Every day | ORAL | Status: DC

## 2015-01-11 MED ORDER — CARVEDILOL 25 MG PO TABS: ORAL_TABLET | ORAL | Status: DC

## 2015-01-11 NOTE — Progress Notes (Signed)
GYNECOLOGY CLINIC PROGRESS NOTE  Subjective:     Lisa Fuentes is a 54 y.o. [id number] female who presents for f//u leg paresthesias.  Still notes that her legs often feel heavy after standing for only a few hours, like she can't hold her own weight.  Notes occasional pricking sensation. Denies numbness.   States that the Gabpentin that was prescribed is not helping.   The following portions of the patient's history were reviewed and updated as appropriate: allergies, current medications, past family history, past medical history, past social history, past surgical history and problem list.   Review of Systems A comprehensive review of systems was negative except for: Musculoskeletal: positive for bilateral leg pain from hips to knees, sharp shooting, tingling    Objective:    BP 105/70 mmHg  Pulse 83  Ht 5\' 7"  (1.702 m)  Wt 253 lb 3.2 oz (114.851 kg)  BMI 39.65 kg/m2 General:  alert and no distress  Extremities: extremities normal, atraumatic, no cyanosis or edema    Neurologic:  Alert and oriented X 3, normal strength and tone. Normal symmetric reflexes. Normal coordination and gait       Assessment:   Bilateral leg paresthesias  Plan:   1. Leg pain (bilateral) likely due to nerve compression while in stirrups during surgery.  No sensory or motor deficits noted.   Has been taking NSAIDs with minimal relief.  Will discontinue Gabapentin and try Amitriptyline.  Also recommended referral to Physical Therapy, however patient declines at this time. Will reassess in January (after new health benefits, per patient's request).   Advised that symptoms are often transient, will likely go away w/i 1-3 months.  To f/u if symptoms do not improve or worsen.  2. Activity restrictions: as tolerated.    Rubie Maid, MD [facility]

## 2015-01-11 NOTE — Telephone Encounter (Signed)
90 day supply

## 2015-01-13 ENCOUNTER — Other Ambulatory Visit: Payer: Self-pay | Admitting: *Deleted

## 2015-01-13 DIAGNOSIS — Z1231 Encounter for screening mammogram for malignant neoplasm of breast: Secondary | ICD-10-CM

## 2015-01-13 DIAGNOSIS — N63 Unspecified lump in unspecified breast: Secondary | ICD-10-CM

## 2015-02-11 ENCOUNTER — Telehealth: Payer: Self-pay

## 2015-02-11 NOTE — Telephone Encounter (Signed)
Faxed clearance to [place], [phone]

## 2015-02-22 ENCOUNTER — Telehealth: Payer: Self-pay

## 2015-02-22 NOTE — Telephone Encounter (Signed)
Received fax from [facility] regarding ICD pt clearance.  Given to [facility] for Dr. Olin Pia review.

## 2015-02-24 ENCOUNTER — Other Ambulatory Visit: Payer: Medicare Other

## 2015-02-25 ENCOUNTER — Other Ambulatory Visit: Payer: Self-pay | Admitting: Orthopedic Surgery

## 2015-03-01 ENCOUNTER — Ambulatory Visit: Admission: RE | Admit: 2015-03-01 | Payer: Medicare HMO | Source: Ambulatory Visit | Admitting: Orthopedic Surgery

## 2015-03-01 ENCOUNTER — Ambulatory Visit
Admission: RE | Admit: 2015-03-01 | Discharge: 2015-03-01 | Disposition: A | Discharge status: Home / Self Care | Payer: Medicare HMO | Source: Ambulatory Visit | Attending: Orthopedic Surgery | Admitting: Orthopedic Surgery

## 2015-03-01 ENCOUNTER — Encounter: Admission: RE | Payer: Self-pay | Source: Ambulatory Visit

## 2015-03-01 ENCOUNTER — Encounter
Admission: RE | Admit: 2015-03-01 | Discharge: 2015-03-01 | Disposition: A | Discharge status: Home / Self Care | Payer: Medicare HMO | Source: Ambulatory Visit | Attending: Orthopedic Surgery | Admitting: Orthopedic Surgery

## 2015-03-01 DIAGNOSIS — I509 Heart failure, unspecified: Secondary | ICD-10-CM | POA: Insufficient documentation

## 2015-03-01 DIAGNOSIS — Z01810 Encounter for preprocedural cardiovascular examination: Secondary | ICD-10-CM | POA: Diagnosis present

## 2015-03-01 DIAGNOSIS — Z01818 Encounter for other preprocedural examination: Secondary | ICD-10-CM

## 2015-03-01 LAB — PROTIME-INR
INR: 0.99
Prothrombin Time: 13.3 seconds (ref 11.4–15.0)

## 2015-03-01 LAB — URINALYSIS COMPLETE WITH MICROSCOPIC (ARMC ONLY)
Bilirubin Urine: NEGATIVE
Glucose, UA: NEGATIVE mg/dL
KETONES UR: NEGATIVE mg/dL
NITRITE: NEGATIVE
PH: 6 (ref 5.0–8.0)
PROTEIN: NEGATIVE mg/dL
SPECIFIC GRAVITY, URINE: 1.014 (ref 1.005–1.030)

## 2015-03-01 LAB — COMPREHENSIVE METABOLIC PANEL
ALBUMIN: 4.7 g/dL (ref 3.5–5.0)
ALT: 15 U/L (ref 14–54)
AST: 18 U/L (ref 15–41)
Alkaline Phosphatase: 86 U/L (ref 38–126)
Anion gap: 6 (ref 5–15)
BILIRUBIN TOTAL: 0.6 mg/dL (ref 0.3–1.2)
BUN: 15 mg/dL (ref 6–20)
CO2: 30 mmol/L (ref 22–32)
Calcium: 9.8 mg/dL (ref 8.9–10.3)
Chloride: 102 mmol/L (ref 101–111)
Creatinine, Ser: 0.79 mg/dL (ref 0.44–1.00)
GFR calc Af Amer: >60 mL/min (ref >60)
GFR calc non Af Amer: >60 mL/min (ref >60)
GLUCOSE: 91 mg/dL (ref 65–99)
POTASSIUM: 3.7 mmol/L (ref 3.5–5.1)
Sodium: 138 mmol/L (ref 135–145)
TOTAL PROTEIN: 8.4 g/dL — AB (ref 6.5–8.1)

## 2015-03-01 LAB — CBC WITH DIFFERENTIAL/PLATELET
BASOS ABS: 0 10*3/uL (ref 0–0.1)
BASOS PCT: 1 %
Eosinophils Absolute: 0.1 10*3/uL (ref 0–0.7)
Eosinophils Relative: 2 %
HCT: 42.6 % (ref 35.0–47.0)
HEMOGLOBIN: 13.9 g/dL (ref 12.0–16.0)
Lymphocytes Relative: 37 %
Lymphs Abs: 1.6 10*3/uL (ref 1.0–3.6)
MCH: 29.5 pg (ref 26.0–34.0)
MCHC: 32.7 g/dL (ref 32.0–36.0)
MCV: 90.1 fL (ref 80.0–100.0)
MONO ABS: 0.3 10*3/uL (ref 0.2–0.9)
Monocytes Relative: 7 %
NEUTROS ABS: 2.4 10*3/uL (ref 1.4–6.5)
NEUTROS PCT: 53 %
Platelets: 162 10*3/uL (ref 150–440)
RBC: 4.73 MIL/uL (ref 3.80–5.20)
RDW: 13 % (ref 11.5–14.5)
WBC: 4.4 10*3/uL (ref 3.6–11.0)

## 2015-03-01 LAB — APTT: APTT: 32 s (ref 24–36)

## 2015-03-01 SURGERY — ARTHROSCOPY, KNEE, WITH LATERAL MENISCECTOMY
Anesthesia: General | Laterality: Left

## 2015-03-01 NOTE — Pre-Procedure Instructions (Signed)
UA results faxed to Dr Mack Guise office, Judeen Hammans notified

## 2015-03-01 NOTE — Patient Instructions (Signed)
  Your procedure is scheduled on: Tomorrow 03/02/15 Report to Day Surgery. 2nd floor medical mall entrance To find out your arrival time please call [phone] between 1PM - 3PM on today.  Remember: Instructions that are not followed completely may result in serious medical risk, up to and including death, or upon the discretion of your surgeon and anesthesiologist your surgery may need to be rescheduled.    __x__ 1. Do not eat food or drink liquids after midnight. No gum chewing or hard candies.     __x__ 2. No Alcohol for 24 hours before or after surgery.   ____ 3. Bring all medications with you on the day of surgery if instructed.    ___x_ 4. Notify your doctor if there is any change in your medical condition     (cold, fever, infections).     Do not wear jewelry, make-up, hairpins, clips or nail polish.  Do not wear lotions, powders, or perfumes.  Do not shave 48 hours prior to surgery. Men may shave face and neck.  Do not bring valuables to the hospital.    [facility] is not responsible for any belongings or valuables.               Contacts, dentures or bridgework may not be worn into surgery.  Leave your suitcase in the car. After surgery it may be brought to your room.  For patients admitted to the hospital, discharge time is determined by your                treatment team.   Patients discharged the day of surgery will not be allowed to drive home.   Please read over the following fact sheets that you were given:   Surgical Site Infection Prevention   __x__ Take these medicines the morning of surgery with A SIP OF WATER:    1. carvedilol  2. citalopram  3. esomeprazole  4. levothyroxine  5. lisinopril  6.   ____ Fleet Enema (as directed)   __x__ Use CHG Soap as directed  ____ Use inhalers on the day of surgery  ____ Stop metformin 2 days prior to surgery    ____ Take 1/2 of usual insulin dose the night before surgery and none on the morning of surgery.    __x__ Stop Coumadin/Plavix/aspirin on today  ____ Stop Anti-inflammatories on    ____ Stop supplements until after surgery.    __x__ Bring C-Pap to the hospital.

## 2015-03-02 ENCOUNTER — Ambulatory Visit: Payer: Medicare HMO | Admitting: Anesthesiology

## 2015-03-02 ENCOUNTER — Encounter
Admission: RE | Disposition: A | Discharge status: Home / Self Care | Payer: Self-pay | Source: Ambulatory Visit | Attending: Orthopedic Surgery

## 2015-03-02 ENCOUNTER — Ambulatory Visit
Admission: RE | Admit: 2015-03-02 | Discharge: 2015-03-02 | Disposition: A | Discharge status: Home / Self Care | Payer: Medicare HMO | Source: Ambulatory Visit | Attending: Orthopedic Surgery | Admitting: Orthopedic Surgery

## 2015-03-02 ENCOUNTER — Encounter: Payer: Self-pay | Admitting: *Deleted

## 2015-03-02 DIAGNOSIS — Z7982 Long term (current) use of aspirin: Secondary | ICD-10-CM | POA: Insufficient documentation

## 2015-03-02 DIAGNOSIS — I8390 Asymptomatic varicose veins of unspecified lower extremity: Secondary | ICD-10-CM | POA: Diagnosis not present

## 2015-03-02 DIAGNOSIS — F329 Major depressive disorder, single episode, unspecified: Secondary | ICD-10-CM | POA: Insufficient documentation

## 2015-03-02 DIAGNOSIS — I2510 Atherosclerotic heart disease of native coronary artery without angina pectoris: Secondary | ICD-10-CM | POA: Insufficient documentation

## 2015-03-02 DIAGNOSIS — I509 Heart failure, unspecified: Secondary | ICD-10-CM | POA: Diagnosis not present

## 2015-03-02 DIAGNOSIS — Y939 Activity, unspecified: Secondary | ICD-10-CM | POA: Diagnosis not present

## 2015-03-02 DIAGNOSIS — Z6839 Body mass index (BMI) 39.0-39.9, adult: Secondary | ICD-10-CM | POA: Insufficient documentation

## 2015-03-02 DIAGNOSIS — G4730 Sleep apnea, unspecified: Secondary | ICD-10-CM | POA: Insufficient documentation

## 2015-03-02 DIAGNOSIS — M1990 Unspecified osteoarthritis, unspecified site: Secondary | ICD-10-CM | POA: Diagnosis not present

## 2015-03-02 DIAGNOSIS — Z8249 Family history of ischemic heart disease and other diseases of the circulatory system: Secondary | ICD-10-CM | POA: Insufficient documentation

## 2015-03-02 DIAGNOSIS — S83282A Other tear of lateral meniscus, current injury, left knee, initial encounter: Secondary | ICD-10-CM | POA: Insufficient documentation

## 2015-03-02 DIAGNOSIS — Z833 Family history of diabetes mellitus: Secondary | ICD-10-CM | POA: Diagnosis not present

## 2015-03-02 DIAGNOSIS — R0602 Shortness of breath: Secondary | ICD-10-CM | POA: Diagnosis not present

## 2015-03-02 DIAGNOSIS — Z95810 Presence of automatic (implantable) cardiac defibrillator: Secondary | ICD-10-CM | POA: Insufficient documentation

## 2015-03-02 DIAGNOSIS — I10 Essential (primary) hypertension: Secondary | ICD-10-CM | POA: Insufficient documentation

## 2015-03-02 DIAGNOSIS — Z90710 Acquired absence of both cervix and uterus: Secondary | ICD-10-CM | POA: Diagnosis not present

## 2015-03-02 DIAGNOSIS — Z8261 Family history of arthritis: Secondary | ICD-10-CM | POA: Insufficient documentation

## 2015-03-02 DIAGNOSIS — E039 Hypothyroidism, unspecified: Secondary | ICD-10-CM | POA: Insufficient documentation

## 2015-03-02 DIAGNOSIS — Z79899 Other long term (current) drug therapy: Secondary | ICD-10-CM | POA: Diagnosis not present

## 2015-03-02 DIAGNOSIS — E669 Obesity, unspecified: Secondary | ICD-10-CM | POA: Insufficient documentation

## 2015-03-02 DIAGNOSIS — E7800 Pure hypercholesterolemia, unspecified: Secondary | ICD-10-CM | POA: Insufficient documentation

## 2015-03-02 DIAGNOSIS — M25562 Pain in left knee: Secondary | ICD-10-CM | POA: Diagnosis not present

## 2015-03-02 DIAGNOSIS — Z823 Family history of stroke: Secondary | ICD-10-CM | POA: Diagnosis not present

## 2015-03-02 DIAGNOSIS — E876 Hypokalemia: Secondary | ICD-10-CM | POA: Diagnosis not present

## 2015-03-02 DIAGNOSIS — M94262 Chondromalacia, left knee: Secondary | ICD-10-CM | POA: Diagnosis not present

## 2015-03-02 DIAGNOSIS — M7989 Other specified soft tissue disorders: Secondary | ICD-10-CM | POA: Diagnosis not present

## 2015-03-02 DIAGNOSIS — M659 Synovitis and tenosynovitis, unspecified: Secondary | ICD-10-CM | POA: Diagnosis not present

## 2015-03-02 DIAGNOSIS — I429 Cardiomyopathy, unspecified: Secondary | ICD-10-CM | POA: Insufficient documentation

## 2015-03-02 DIAGNOSIS — X58XXXA Exposure to other specified factors, initial encounter: Secondary | ICD-10-CM | POA: Insufficient documentation

## 2015-03-02 DIAGNOSIS — F419 Anxiety disorder, unspecified: Secondary | ICD-10-CM | POA: Insufficient documentation

## 2015-03-02 HISTORY — PX: KNEE ARTHROSCOPY: SHX127

## 2015-03-02 SURGERY — ARTHROSCOPY, KNEE
Anesthesia: General | Laterality: Left | Wound class: Clean

## 2015-03-02 MED ORDER — BUPIVACAINE-EPINEPHRINE (PF) 0.25% -1:200000 IJ SOLN
INTRAMUSCULAR | Status: AC
  Filled 2015-03-02: qty 30

## 2015-03-02 MED ORDER — HYDROCODONE-ACETAMINOPHEN 5-325 MG PO TABS
ORAL_TABLET | ORAL | Status: DC
Start: 2015-03-02 — End: 2015-03-02
  Filled 2015-03-02: qty 1

## 2015-03-02 MED ORDER — LIDOCAINE HCL 1 % IJ SOLN
INTRAMUSCULAR | Status: DC | PRN
  Administered 2015-03-02: 15 mL

## 2015-03-02 MED ORDER — HYDROCODONE-ACETAMINOPHEN 5-325 MG PO TABS: 1.0000 | ORAL_TABLET | Freq: Four times a day (QID) | ORAL | Status: DC | PRN

## 2015-03-02 MED ORDER — HYDROCODONE-ACETAMINOPHEN 5-325 MG PO TABS
1.0000 | ORAL_TABLET | ORAL | Status: DC | PRN
  Administered 2015-03-02: 1 via ORAL

## 2015-03-02 MED ORDER — LIDOCAINE HCL (CARDIAC) 20 MG/ML IV SOLN
INTRAVENOUS | Status: DC | PRN
  Administered 2015-03-02: 100 mg via INTRAVENOUS

## 2015-03-02 MED ORDER — PROPOFOL 10 MG/ML IV BOLUS
INTRAVENOUS | Status: DC | PRN
  Administered 2015-03-02: 180 mg via INTRAVENOUS

## 2015-03-02 MED ORDER — CEFAZOLIN SODIUM-DEXTROSE 2-3 GM-% IV SOLR: 2.0000 g | INTRAVENOUS | Status: DC

## 2015-03-02 MED ORDER — CEFAZOLIN SODIUM-DEXTROSE 2-3 GM-% IV SOLR
INTRAVENOUS | Status: AC
  Administered 2015-03-02: 2000 mg
  Filled 2015-03-02: qty 50

## 2015-03-02 MED ORDER — FENTANYL CITRATE (PF) 100 MCG/2ML IJ SOLN: 25.0000 ug | INTRAMUSCULAR | Status: DC | PRN

## 2015-03-02 MED ORDER — SUGAMMADEX SODIUM 200 MG/2ML IV SOLN
INTRAVENOUS | Status: DC | PRN
  Administered 2015-03-02: 200 mg via INTRAVENOUS

## 2015-03-02 MED ORDER — ROCURONIUM BROMIDE 100 MG/10ML IV SOLN
INTRAVENOUS | Status: DC | PRN
  Administered 2015-03-02: 40 mg via INTRAVENOUS
  Administered 2015-03-02: 10 mg via INTRAVENOUS

## 2015-03-02 MED ORDER — CHLORHEXIDINE GLUCONATE 4 % EX LIQD: 1.0000 "application " | Freq: Once | CUTANEOUS | Status: DC

## 2015-03-02 MED ORDER — ONDANSETRON HCL 4 MG/2ML IJ SOLN
INTRAMUSCULAR | Status: DC | PRN
  Administered 2015-03-02: 4 mg via INTRAVENOUS

## 2015-03-02 MED ORDER — BUPIVACAINE-EPINEPHRINE 0.25% -1:200000 IJ SOLN
INTRAMUSCULAR | Status: DC | PRN
  Administered 2015-03-02: 30 mL

## 2015-03-02 MED ORDER — LIDOCAINE HCL (PF) 1 % IJ SOLN
INTRAMUSCULAR | Status: AC
  Filled 2015-03-02: qty 30

## 2015-03-02 MED ORDER — LACTATED RINGERS IV SOLN
INTRAVENOUS | Status: DC
  Administered 2015-03-02 (×3): via INTRAVENOUS

## 2015-03-02 MED ORDER — FENTANYL CITRATE (PF) 100 MCG/2ML IJ SOLN
INTRAMUSCULAR | Status: DC | PRN
  Administered 2015-03-02: 100 ug via INTRAVENOUS

## 2015-03-02 MED ORDER — DEXAMETHASONE SODIUM PHOSPHATE 10 MG/ML IJ SOLN
INTRAMUSCULAR | Status: DC | PRN
  Administered 2015-03-02: 10 mg via INTRAVENOUS

## 2015-03-02 MED ORDER — ASPIRIN EC 325 MG PO TBEC: 325.0000 mg | DELAYED_RELEASE_TABLET | Freq: Every day | ORAL | Status: DC

## 2015-03-02 MED ORDER — ONDANSETRON HCL 4 MG/2ML IJ SOLN: 4.0000 mg | Freq: Once | INTRAMUSCULAR | Status: DC | PRN

## 2015-03-02 MED ORDER — SUCCINYLCHOLINE 20MG/ML (10ML) SYRINGE FOR MEDFUSION PUMP - OPTIME
INTRAMUSCULAR | Status: DC | PRN
  Administered 2015-03-02: 20 mg via INTRAVENOUS

## 2015-03-02 MED ORDER — MIDAZOLAM HCL 2 MG/2ML IJ SOLN
INTRAMUSCULAR | Status: DC | PRN
  Administered 2015-03-02 (×2): 2 mg via INTRAVENOUS

## 2015-03-02 MED ORDER — LACTATED RINGERS IR SOLN
Status: DC | PRN
  Administered 2015-03-02: 21600 mL

## 2015-03-02 SURGICAL SUPPLY — 32 items
BUR RADIUS 3.5 (BURR) ×3 IMPLANT
BUR RADIUS 4.0X18.5 (BURR) ×3 IMPLANT
CLOSURE WOUND 1/2 X4 (GAUZE/BANDAGES/DRESSINGS) ×2
COOLER POLAR GLACIER W/PUMP (MISCELLANEOUS) IMPLANT
DRAPE IMP U-DRAPE 54X76 (DRAPES) ×3 IMPLANT
DURAPREP 26ML APPLICATOR (WOUND CARE) ×6 IMPLANT
GAUZE PETRO XEROFOAM 1X8 (MISCELLANEOUS) ×3 IMPLANT
GAUZE SPONGE 4X4 12PLY STRL (GAUZE/BANDAGES/DRESSINGS) ×3 IMPLANT
GLOVE BIOGEL PI IND STRL 9 (GLOVE) ×1 IMPLANT
GLOVE BIOGEL PI INDICATOR 9 (GLOVE) ×2
GLOVE SURG 9.0 ORTHO LTXF (GLOVE) ×3 IMPLANT
GOWN STRL REUS W/ TWL LRG LVL3 (GOWN DISPOSABLE) ×1 IMPLANT
GOWN STRL REUS W/TWL 2XL LVL3 (GOWN DISPOSABLE) ×3 IMPLANT
GOWN STRL REUS W/TWL LRG LVL3 (GOWN DISPOSABLE) ×2
IV LACTATED RINGER IRRG 3000ML (IV SOLUTION) ×16
IV LR IRRIG 3000ML ARTHROMATIC (IV SOLUTION) ×8 IMPLANT
KIT RM TURNOVER STRD PROC AR (KITS) ×3 IMPLANT
MANIFOLD NEPTUNE II (INSTRUMENTS) ×3 IMPLANT
PACK ARTHROSCOPY KNEE (MISCELLANEOUS) ×3 IMPLANT
PAD ABD DERMACEA PRESS 5X9 (GAUZE/BANDAGES/DRESSINGS) ×6 IMPLANT
PAD WRAPON POLAR KNEE (MISCELLANEOUS) IMPLANT
SET TUBE SUCT SHAVER OUTFL 24K (TUBING) ×3 IMPLANT
SOL PREP PVP 2OZ (MISCELLANEOUS) ×3
SOLUTION PREP PVP 2OZ (MISCELLANEOUS) ×1 IMPLANT
STRIP CLOSURE SKIN 1/2X4 (GAUZE/BANDAGES/DRESSINGS) ×4 IMPLANT
SUT ETHILON 4-0 (SUTURE) ×2
SUT ETHILON 4-0 FS2 18XMFL BLK (SUTURE) ×1
SUTURE ETHLN 4-0 FS2 18XMF BLK (SUTURE) ×1 IMPLANT
TUBING ARTHRO INFLOW-ONLY STRL (TUBING) ×3 IMPLANT
WAND HAND CNTRL MULTIVAC 50 (MISCELLANEOUS) IMPLANT
WAND HAND CNTRL MULTIVAC 90 (MISCELLANEOUS) ×3 IMPLANT
WRAPON POLAR PAD KNEE (MISCELLANEOUS)

## 2015-03-02 NOTE — Anesthesia Procedure Notes (Signed)
Procedure Name: Intubation Date/Time: 03/02/2015 4:21 PM Performed by: Nelda Marseille Pre-anesthesia Checklist: Patient identified, Patient being monitored, Timeout performed, Emergency Drugs available and Suction available Patient Re-evaluated:Patient Re-evaluated prior to inductionOxygen Delivery Method: Circle system utilized Preoxygenation: Pre-oxygenation with 100% oxygen Intubation Type: IV induction Ventilation: Mask ventilation without difficulty Laryngoscope Size: McGraph and 4 Grade View: Grade IV Tube type: Oral Tube size: 7.0 mm Number of attempts: 1 Airway Equipment and Method: Stylet and Video-laryngoscopy Placement Confirmation: ETT inserted through vocal cords under direct vision,  positive ETCO2 and breath sounds checked- equal and bilateral Secured at: 23 cm Tube secured with: Tape Dental Injury: Teeth and Oropharynx as per pre-operative assessment

## 2015-03-02 NOTE — H&P (Signed)
The patient has been re-examined, and the chart reviewed, and there have been no interval changes to the documented history and physical.    The risks, benefits, and alternatives have been discussed at length, and the patient is willing to proceed.   

## 2015-03-02 NOTE — Op Note (Signed)
  PATIENT:  Lisa Fuentes  PRE-OPERATIVE DIAGNOSIS:  TEAR OF LATERAL MENISCUS, LEFT KNEE  POST-OPERATIVE DIAGNOSIS:  Same  PROCEDURE:  LEFT KNEE ARTHROSCOPY WITH  Partial LATERAL MENISECTOMY, synovectomy  SURGEON:  Thornton Park, MD  ANESTHESIA:   General  PREOPERATIVE INDICATIONS:  Lisa Fuentes  55 y.o. female with a diagnosis of TEAR OF LATERAL MENISCUS who failed conservative management and elected for surgical management.    The risks benefits and alternatives were discussed with the patient preoperatively including the risks of infection, bleeding, nerve injury, knee stiffness, persistent pain, osteoarthritis and the need for further surgery. Medical  risks include DVT and pulmonary embolism, myocardial infarction, stroke, pneumonia, respiratory failure and death. The patient understood these risks and wished to proceed.   OPERATIVE FINDINGS: Tricompartmental osteoarthritis, torn lateral meniscus and synovitis  OPERATIVE PROCEDURE: Patient was met in the preoperative area. The left lower extremity was signed with the word yes and my initials according the hospital's correct site of surgery protocol.  The patient was brought to the operating room where they was placed supine on the operative table. General anesthesia was administered. The patient was prepped and draped in a sterile fashion.  A timeout was performed to verify the patient's name, date of birth, medical record number, correct site of surgery correct procedure to be performed. It was also used to verify the patient had had received antibiotics that all appropriate instruments, and radiographic studies were available in the room. Once all in attendance were in agreement, the case began. Patient had a tourniquet applied to the left thigh but not inflated.  Proposed arthroscopy incisions were drawn out with a surgical marker. These were pre-injected with 1% lidocaine plain. An 11 blade was used to establish an  inferior lateral and inferomedial portals. The inferomedial portal was created using a 18-gauge spinal needle under direct visualization.  A full diagnostic examination of the knee was performed including the suprapatellar pouch, patellofemoral joint, medial lateral compartments as well as the medial lateral gutters, the intercondylar notch in the posterior knee.  Findings on arthroscopy included generalized chondromalacia in all 3 compartments of the left knee including areas of near full-thickness cartilage loss, extensive degenerative tear of the lateral meniscus and narrowing of the lateral compartment joint space,  Patient had the lateral meniscal tear treated with a 3-5 and 4-0 resector shaver blades and straight duckbill basket. The meniscus was debrided until a stable rim was achieved. A chondroplasty of the medial and lateral femoral condyles was also performed using a 4-0 resector shaver blade. A partial synovectomy was also performed using a 4-0 resector shaver blade and 90 ArthroCare wand including the suprapatellar pouch, the anterior knee as well as the medial lateral gutters..  The knee was then copiously lavaged. All arthroscopic incisions removed. The 2 arthroscopy portals were closed with 4-0 nylon. Steri-Strips were applied along with a dry sterile and compressive dressing. The patient was brought to the PACU in stable condition. I scrubbed and present the entire case and all sharp and instrument counts were correct at the conclusion the case. I spoke to the patient's family postoperatively to let them know the case it done without complication and the patient was stable in the recovery room.    Timoteo Gaul, MD

## 2015-03-02 NOTE — OR Nursing (Signed)
Voided on emergence from anesthesia

## 2015-03-02 NOTE — Transfer of Care (Signed)
Immediate Anesthesia Transfer of Care Note  Patient: Lisa Fuentes  Procedure(s) Performed: Procedure(s) with comments: ARTHROSCOPY  LEFT KNEE, PARTIAL LATERAL  MENISECTOMY, SYNOVECTOMY, MEDIAL & LATERAL CHONDROPLASTY (Left) - Dr. Mack Guise wanted her surgery switched from [place] because of the Defibrillator.  patient has a Defibrillator  takes Asprin/knows to stop 5days before  has had some test done just recent at [place] 7-10 DAYS  Patient Location: PACU  Anesthesia Type:General  Level of Consciousness: patient cooperative and lethargic  Airway & Oxygen Therapy: Patient Spontanous Breathing and Patient connected to face mask oxygen  Post-op Assessment: Report given to RN and Post -op Vital signs reviewed and stable  Post vital signs: Reviewed and stable  Last Vitals:  Filed Vitals:   03/02/15 1323 03/02/15 1751  BP: 119/72 107/83  Pulse: 86 84  Temp: 36.9 C 37 C  Resp: 16 15    Complications: No apparent anesthesia complications

## 2015-03-02 NOTE — Anesthesia Preprocedure Evaluation (Addendum)
Anesthesia Evaluation  Patient identified by MRN, date of birth, ID band Patient awake    Reviewed: Allergy & Precautions, NPO status , Patient's Chart, lab work & pertinent test results  History of Anesthesia Complications (+) DIFFICULT AIRWAY and history of anesthetic complications  Airway Mallampati: II  TM Distance: <3 FB Neck ROM: Limited    Dental  (+) Partial Upper, Missing   Pulmonary shortness of breath and with exertion, sleep apnea and Continuous Positive Airway Pressure Ventilation , neg COPD, neg recent URI,    breath sounds clear to auscultation       Cardiovascular Exercise Tolerance: Poor hypertension, Pt. on medications and Pt. on home beta blockers + CAD and +CHF  (-) Past MI, (-) Cardiac Stents and (-) CABG + dysrhythmias (history of blood clot in her heart) Atrial Fibrillation + Cardiac Defibrillator (-) Valvular Problems/Murmurs Rhythm:Regular Rate:Normal  Cardiomyopathy. EF in 2000 was 20%, has improved to 45%. The AICD was inserted when her EF was 20%. N afib. On ASA, no other blood thinners.   Neuro/Psych  Headaches, neg Seizures PSYCHIATRIC DISORDERS (Depression)    GI/Hepatic Neg liver ROS, GERD  ,  Endo/Other  neg diabetesHypothyroidism Morbid obesityTreated hypothyroidism.  Renal/GU negative Renal ROS  negative genitourinary   Musculoskeletal  (+) Arthritis , Osteoarthritis,    Abdominal (+) + obese,   Peds  Hematology negative hematology ROS (+)   Anesthesia Other Findings   Reproductive/Obstetrics                            Anesthesia Physical  Anesthesia Plan  ASA: IV  Anesthesia Plan: General   Post-op Pain Management:    Induction: Intravenous  Airway Management Planned: Oral ETT and Video Laryngoscope Planned  Additional Equipment:   Intra-op Plan:   Post-operative Plan: Extubation in OR  Informed Consent: I have reviewed the patients History  and Physical, chart, labs and discussed the procedure including the risks, benefits and alternatives for the proposed anesthesia with the patient or authorized representative who has indicated his/her understanding and acceptance.     Plan Discussed with: CRNA, Anesthesiologist and Surgeon  Anesthesia Plan Comments:         Anesthesia Quick Evaluation

## 2015-03-02 NOTE — Discharge Instructions (Signed)

## 2015-03-03 ENCOUNTER — Encounter: Payer: Self-pay | Admitting: *Deleted

## 2015-03-03 ENCOUNTER — Emergency Department
Admission: EM | Admit: 2015-03-03 | Discharge: 2015-03-03 | Disposition: A | Discharge status: Home / Self Care | Payer: Medicare HMO | Attending: Emergency Medicine | Admitting: Emergency Medicine

## 2015-03-03 DIAGNOSIS — Z7982 Long term (current) use of aspirin: Secondary | ICD-10-CM | POA: Insufficient documentation

## 2015-03-03 DIAGNOSIS — Z79899 Other long term (current) drug therapy: Secondary | ICD-10-CM | POA: Diagnosis not present

## 2015-03-03 DIAGNOSIS — M25562 Pain in left knee: Secondary | ICD-10-CM | POA: Diagnosis present

## 2015-03-03 DIAGNOSIS — Z9889 Other specified postprocedural states: Secondary | ICD-10-CM | POA: Insufficient documentation

## 2015-03-03 DIAGNOSIS — I10 Essential (primary) hypertension: Secondary | ICD-10-CM | POA: Insufficient documentation

## 2015-03-03 DIAGNOSIS — G8918 Other acute postprocedural pain: Secondary | ICD-10-CM

## 2015-03-03 LAB — BASIC METABOLIC PANEL
Anion gap: 8 (ref 5–15)
BUN: 22 mg/dL — AB (ref 6–20)
CO2: 27 mmol/L (ref 22–32)
CREATININE: 0.79 mg/dL (ref 0.44–1.00)
Calcium: 9.9 mg/dL (ref 8.9–10.3)
Chloride: 104 mmol/L (ref 101–111)
GFR calc Af Amer: >60 mL/min (ref >60)
Glucose, Bld: 119 mg/dL — ABNORMAL HIGH (ref 65–99)
Potassium: 3.7 mmol/L (ref 3.5–5.1)
SODIUM: 139 mmol/L (ref 135–145)

## 2015-03-03 LAB — CBC
HCT: 39.5 % (ref 35.0–47.0)
HEMOGLOBIN: 13 g/dL (ref 12.0–16.0)
MCH: 29.9 pg (ref 26.0–34.0)
MCHC: 32.9 g/dL (ref 32.0–36.0)
MCV: 90.8 fL (ref 80.0–100.0)
PLATELETS: 160 10*3/uL (ref 150–440)
RBC: 4.35 MIL/uL (ref 3.80–5.20)
RDW: 12.8 % (ref 11.5–14.5)
WBC: 13.2 10*3/uL — AB (ref 3.6–11.0)

## 2015-03-03 MED ORDER — ONDANSETRON 4 MG PO TBDP
4.0000 mg | ORAL_TABLET | Freq: Once | ORAL | Status: AC
  Administered 2015-03-03: 4 mg via ORAL
  Filled 2015-03-03: qty 1

## 2015-03-03 MED ORDER — HYDROMORPHONE HCL 1 MG/ML IJ SOLN
1.0000 mg | Freq: Once | INTRAMUSCULAR | Status: AC
  Administered 2015-03-03: 1 mg via INTRAMUSCULAR
  Filled 2015-03-03: qty 1

## 2015-03-03 MED ORDER — OXYCODONE-ACETAMINOPHEN 5-325 MG PO TABS
ORAL_TABLET | ORAL | Status: AC
  Administered 2015-03-03: 1 via ORAL
  Filled 2015-03-03: qty 1

## 2015-03-03 MED ORDER — HYDROMORPHONE HCL 1 MG/ML IJ SOLN
1.0000 mg | Freq: Once | INTRAMUSCULAR | Status: AC
  Administered 2015-03-03: 1 mg via INTRAVENOUS
  Filled 2015-03-03: qty 1

## 2015-03-03 MED ORDER — KETOROLAC TROMETHAMINE 30 MG/ML IJ SOLN
30.0000 mg | Freq: Once | INTRAMUSCULAR | Status: AC
  Administered 2015-03-03: 30 mg via INTRAVENOUS
  Filled 2015-03-03: qty 1

## 2015-03-03 MED ORDER — OXYCODONE-ACETAMINOPHEN 5-325 MG PO TABS
1.0000 | ORAL_TABLET | Freq: Once | ORAL | Status: AC
  Administered 2015-03-03: 1 via ORAL

## 2015-03-03 NOTE — ED Notes (Signed)
Pt to ED from home with uncontrollable left leg pain following meniscus tear repair yesterday. Pt taking 5/325 mg hydrocodones for PRN use along with ibu for pain with no relief. Pt tearful upon arrival, pain 10/10. Left leg pedal pulses present and intact, skin warm and dry, no acute distress noted.

## 2015-03-03 NOTE — ED Provider Notes (Signed)
[facility] Emergency Department Provider Note  Time seen: 12:41 PM  I have reviewed the triage vital signs and the nursing notes.   HISTORY  Chief Complaint Leg Pain    HPI Lisa Fuentes is a 55 y.o. female with a past medical history of hypertension, hyperlipidemia, hypothyroidism, depression, anxiety, CHF who presents the emergency department with left knee pain 1 day status post left knee surgery. According to the patient she had a meniscus repair performed by Dr. Mack Guise yesterday. She has been taking her pain medication at home without relief. She has been bearing weight with the use of a walker as instructed. Patient denies fever, nausea, vomiting.     Past Medical History  Diagnosis Date  . Thyroid disease   . Menopausal symptoms   . Anemia   . Tachycardia   . Hypertension   . Hypokalemia   . Clotting disorder ([place])   . Nonischemic cardiomyopathy (HCC)     Interval improvement EF 40-45% 1/16  . Sleep apnea     Treated with CPAP  . Implantable defibrillator     [employer] DOI 2008, replacement 2015  . Hypercholesteremia   . Endometriosis of vagina 09/2013    Intra-operative findings of endometriosis implants on cervical stump  . Coronary artery disease   . Dysrhythmia   . Shortness of breath dyspnea     chronic doe  . Hypothyroidism   . Depression   . Anxiety     panic attacks  . Headache   . Arthritis   . Difficult intubation   . CHF (congestive heart failure) [facility])     Patient Active Problem List   Diagnosis Date Noted  . Postoperative state 11/15/2014  . Left ventricular dysfunction 10/29/2014  . Endometriosis 09/18/2014  . ICD (implantable cardioverter-defibrillator) in place 02/02/2014  . Chronic systolic heart failure ([place])   . Essential hypertension   . Hypercholesteremia   . Apnea, sleep 05/12/2013  . Hyperthyroidism 05/08/2013    Past Surgical History  Procedure Laterality Date  . Cardiac defibrillator  placement    . Total abdominal hysterectomy    . Cardiac catheterization  2003    [facility]: No significant coronary artery disease with reduced ejection fraction.  . Tonsillectomy    . Foot surgery    . Colonoscopy  2015  . Tubal ligation Bilateral   . Laparoscopic salpingoopherectomy Right 09/2013    also with left salpingectomy  . Coronary angioplasty    . Hand surgery    . Laparoscopic salpingo oopherectomy Left 11/15/2014    Procedure: LAPAROSCOPIC OOPHORECTOMY;  Surgeon: Rubie Maid, MD;  Location: [facility] ORS;  Service: Gynecology;  Laterality: Left;  . Trachelectomy N/A 11/15/2014    Procedure: TRACHELECTOMY;  Surgeon: Rubie Maid, MD;  Location: [facility] ORS;  Service: Gynecology;  Laterality: N/A;  . Cystoscopy  11/15/2014    Procedure: CYSTOSCOPY;  Surgeon: Rubie Maid, MD;  Location: [facility] ORS;  Service: Gynecology;;    Current Outpatient Rx  Name  Route  Sig  Dispense  Refill  . ALPRAZolam (XANAX) 0.25 MG tablet   Oral   Take 0.25 mg by mouth daily. As needed         . amitriptyline (ELAVIL) 50 MG tablet   Oral   Take 1 tablet (50 mg total) by mouth at bedtime.   30 tablet   3   . aspirin EC 325 MG tablet   Oral   Take 1 tablet (325 mg total) by mouth daily.  30 tablet   0   . carvedilol (COREG) 25 MG tablet      Take 1/2 tablet (12.5 mg) by mouth twice daily   60 tablet   3   . citalopram (CELEXA) 40 MG tablet      TAKE ONE TABLET BY MOUTH ONCE DAILY         . docusate sodium (COLACE) 100 MG capsule   Oral   Take 1 capsule (100 mg total) by mouth 2 (two) times daily as needed for mild constipation.   30 capsule   1   . esomeprazole (NEXIUM) 40 MG capsule   Oral   Take 40 mg by mouth daily at 12 noon.         . furosemide (LASIX) 40 MG tablet      TAKE ONE TABLET BY MOUTH ONCE DAILY *PATIENT OCCASIONALLY TAKES AN EXTRA TABLET*         . HYDROcodone-acetaminophen (NORCO) 5-325 MG tablet   Oral   Take 1-2 tablets by mouth every 6 (six) hours as  needed for moderate pain. MAXIMUM TOTAL ACETAMINOPHEN DOSE IS 4000 MG PER DAY   40 tablet   0   . levothyroxine (SYNTHROID, LEVOTHROID) 125 MCG tablet   Oral   Take 150 mcg by mouth daily before breakfast.          . lisinopril (PRINIVIL,ZESTRIL) 5 MG tablet      TAKE ONE TABLET BY MOUTH ONCE DAILY   30 tablet   3   . loratadine (CLARITIN) 10 MG tablet   Oral   Take 10 mg by mouth daily.         Marland Kitchen nystatin (MYCOSTATIN) powder   Topical   Apply topically 4 (four) times daily as needed.         . simethicone (MYLICON) 80 MG chewable tablet   Oral   Chew 1 tablet (80 mg total) by mouth 4 (four) times daily as needed for flatulence.   30 tablet   0   . spironolactone (ALDACTONE) 25 MG tablet   Oral   Take 0.5 tablets (12.5 mg total) by mouth daily. TAKE ONE-HALF TABLET BY MOUTH ONCE DAILY   90 tablet   3     Allergies Sulfamethoxazole-trimethoprim  Family History  Problem Relation Age of Onset  . Hypertension Mother   . Hyperlipidemia Mother   . Stroke Mother   . Colon cancer Mother     Social History Social History  Substance Use Topics  . Smoking status: Never Smoker   . Smokeless tobacco: Never Used  . Alcohol Use: 0.0 oz/week    0 Standard drinks or equivalent per week     Comment: 5 q week    Review of Systems Constitutional: Negative for fever. Cardiovascular: Negative for chest pain. Respiratory: Negative for shortness of breath. Gastrointestinal: Negative for abdominal pain Musculoskeletal: Positive for left knee pain. Neurological: Negative for headache 10-point ROS otherwise negative.  ____________________________________________   PHYSICAL EXAM:  VITAL SIGNS: ED Triage Vitals  Enc Vitals Group     BP 03/03/15 1119 136/94 mmHg     Pulse Rate 03/03/15 1119 80     Resp 03/03/15 1119 20     Temp 03/03/15 1119 98.3 F (36.8 C)     Temp Source 03/03/15 1119 Oral     SpO2 03/03/15 1119 100 %     Weight 03/03/15 1119 250 lb  (113.399 kg)     Height 03/03/15 1119 5\' 7"  (1.702 m)  Head Cir --      Peak Flow --      Pain Score 03/03/15 1119 10     Pain Loc --      Pain Edu? --      Excl. in [place]? --     Constitutional: Alert and oriented. Moderate distress due to pain. Eyes: Normal exam ENT   Head: Normocephalic and atraumatic.   Mouth/Throat: Mucous membranes are moist. Cardiovascular: Normal rate, regular rhythm. No murmur Respiratory: Normal respiratory effort without tachypnea nor retractions. Breath sounds are clear  Gastrointestinal: Soft and nontender. No distention.   Musculoskeletal: Moderate swelling around the left knee, arthroscopic incisions appear well. Neurovascularly intact distally. Neurologic:  Normal speech and language. No gross focal neurologic deficits  Skin:  Skin is warm, dry and intact.  Psychiatric: Mood and affect are normal. Speech and behavior are normal.  ____________________________________________  INITIAL IMPRESSION / ASSESSMENT AND PLAN / ED COURSE  Pertinent labs & imaging results that were available during my care of the patient were reviewed by me and considered in my medical decision making (see chart for details).  Patient presents the emergency department with increased left knee pain, not controlled with her home Norco. I discussed the patient with Dr. Mack Guise, he will be down to see the patient.   Patient's labs show a slight leukocytosis of 13,000, otherwise normal results. Dr. Mack Guise has seen the patient, she is in no minimal discomfort currently. Sleeping comfortably after the second dose of pain medication. We will discharge the patient home with oxycodone, and orthopedics follow-up. Patient agreeable plan. ____________________________________________   FINAL CLINICAL IMPRESSION(S) / ED DIAGNOSES  Left knee pain   Harvest Dark, MD 03/03/15 1327

## 2015-03-03 NOTE — Consult Note (Signed)
ORTHOPAEDIC CONSULTATION  REQUESTING PHYSICIAN: Dr. Kerman Passey  Chief Complaint: Severe left knee pain status post left knee arthroscopy on 03/02/2015  HPI: Lisa Fuentes is a 55 y.o. female who complains of  severe knee pain overnight following her left knee arthroscopy for partial lateral meniscectomy. She states that the hydrocodone given to her was not controlling her pain. The pain was so severe she came to the ER instead of returning to my office.    Past Medical History  Diagnosis Date  . Thyroid disease   . Menopausal symptoms   . Anemia   . Tachycardia   . Hypertension   . Hypokalemia   . Clotting disorder ([place])   . Nonischemic cardiomyopathy (HCC)     Interval improvement EF 40-45% 1/16  . Sleep apnea     Treated with CPAP  . Implantable defibrillator     Medtronic DOI 2008, replacement 2015  . Hypercholesteremia   . Endometriosis of vagina 09/2013    Intra-operative findings of endometriosis implants on cervical stump  . Coronary artery disease   . Dysrhythmia   . Shortness of breath dyspnea     chronic doe  . Hypothyroidism   . Depression   . Anxiety     panic attacks  . Headache   . Arthritis   . Difficult intubation   . CHF (congestive heart failure) [facility])    Past Surgical History  Procedure Laterality Date  . Cardiac defibrillator placement    . Total abdominal hysterectomy    . Cardiac catheterization  2003    [facility]: No significant coronary artery disease with reduced ejection fraction.  . Tonsillectomy    . Foot surgery    . Colonoscopy  2015  . Tubal ligation Bilateral   . Laparoscopic salpingoopherectomy Right 09/2013    also with left salpingectomy  . Coronary angioplasty    . Hand surgery    . Laparoscopic salpingo oopherectomy Left 11/15/2014    Procedure: LAPAROSCOPIC OOPHORECTOMY;  Surgeon: Rubie Maid, MD;  Location: [facility] ORS;  Service: Gynecology;  Laterality: Left;  . Trachelectomy N/A 11/15/2014    Procedure: TRACHELECTOMY;   Surgeon: Rubie Maid, MD;  Location: [facility] ORS;  Service: Gynecology;  Laterality: N/A;  . Cystoscopy  11/15/2014    Procedure: CYSTOSCOPY;  Surgeon: Rubie Maid, MD;  Location: [facility] ORS;  Service: Gynecology;;   Social History   Social History  . Marital Status: Divorced    Spouse Name: N/A  . Number of Children: N/A  . Years of Education: N/A   Social History Main Topics  . Smoking status: Never Smoker   . Smokeless tobacco: Never Used  . Alcohol Use: 0.0 oz/week    0 Standard drinks or equivalent per week     Comment: 5 q week  . Drug Use: No  . Sexual Activity: Yes    Birth Control/ Protection: None   Other Topics Concern  . None   Social History Narrative   Family History  Problem Relation Age of Onset  . Hypertension Mother   . Hyperlipidemia Mother   . Stroke Mother   . Colon cancer Mother    Allergies  Allergen Reactions  . Sulfamethoxazole-Trimethoprim     Other reaction(s): Unknown Other reaction(s): Unknown   Prior to Admission medications   Medication Sig Start Date End Date Taking? Authorizing Provider  amitriptyline (ELAVIL) 50 MG tablet Take 1 tablet (50 mg total) by mouth at bedtime. 01/11/15  Yes Rubie Maid, MD  carvedilol (COREG) 25 MG  tablet Take 1/2 tablet (12.5 mg) by mouth twice daily 01/11/15  Yes Wellington Hampshire, MD  citalopram (CELEXA) 40 MG tablet TAKE ONE TABLET BY MOUTH ONCE DAILY 11/05/13  Yes Historical Provider, MD  docusate sodium (COLACE) 100 MG capsule Take 1 capsule (100 mg total) by mouth 2 (two) times daily as needed for mild constipation. 11/16/14  Yes Rubie Maid, MD  esomeprazole (NEXIUM) 40 MG capsule Take 40 mg by mouth daily at 12 noon.   Yes Historical Provider, MD  furosemide (LASIX) 40 MG tablet TAKE ONE TABLET BY MOUTH ONCE DAILY *PATIENT OCCASIONALLY TAKES AN EXTRA TABLET* 12/02/13  Yes Historical Provider, MD  levothyroxine (SYNTHROID, LEVOTHROID) 125 MCG tablet Take 150 mcg by mouth daily before breakfast.    Yes  Historical Provider, MD  lisinopril (PRINIVIL,ZESTRIL) 5 MG tablet TAKE ONE TABLET BY MOUTH ONCE DAILY 09/28/14  Yes Wellington Hampshire, MD  loratadine (CLARITIN) 10 MG tablet Take 10 mg by mouth daily.   Yes Historical Provider, MD  simethicone (MYLICON) 80 MG chewable tablet Chew 1 tablet (80 mg total) by mouth 4 (four) times daily as needed for flatulence. 11/16/14  Yes Rubie Maid, MD  spironolactone (ALDACTONE) 25 MG tablet Take 0.5 tablets (12.5 mg total) by mouth daily. TAKE ONE-HALF TABLET BY MOUTH ONCE DAILY 10/08/14  Yes Wellington Hampshire, MD  ALPRAZolam Duanne Moron) 0.25 MG tablet Take 0.25 mg by mouth daily. As needed    Historical Provider, MD  aspirin EC 325 MG tablet Take 1 tablet (325 mg total) by mouth daily. 03/02/15   Thornton Park, MD  HYDROcodone-acetaminophen (NORCO) 5-325 MG tablet Take 1-2 tablets by mouth every 6 (six) hours as needed for moderate pain. MAXIMUM TOTAL ACETAMINOPHEN DOSE IS 4000 MG PER DAY 03/02/15   Thornton Park, MD  nystatin (MYCOSTATIN) powder Apply topically 4 (four) times daily as needed.    Historical Provider, MD   No results found.  Positive ROS: All other systems have been reviewed and were otherwise negative with the exception of those mentioned in the HPI and as above.  Physical Exam: General: Patient drowsy but in no acute distress.    MUSCULOSKELETAL: Left knee: Patient's incisions are dry. There is dried blood on the Steri-Strips but no active drainage from the wound. Patient has a mild effusion. Her thigh and leg compartments are soft and compressible. She has palpable pedal pulses and intact sensation light touch. She can flex and extend her toes and dorsiflex and plantarflex her ankle. She has tenderness to direct palpation of the left knee globally.  Assessment: Left knee pain status post left knee arthroscopy yesterday  Plan: Patient has her pain better controlled after medication here in the ER. She was neurovascular intact. There is no  evidence of compartment syndrome. She has also no evidence for infection. I am prescribing her oxycodone for pain control. She will discontinue use of the hydrocodone. She was instructed to perform strict elevation and icing of her left knee at home. She will will be partial weightbearing with use of her walker and extend 3-4 days. She'll follow up my office in 1 week.    Thornton Park, MD    03/03/2015 1:30 PM

## 2015-03-03 NOTE — Discharge Instructions (Signed)

## 2015-03-03 NOTE — ED Notes (Signed)
Pt placed on med hold. Will be d/c in 15 mins. Pt made aware and verbalized understanding

## 2015-03-09 NOTE — Anesthesia Postprocedure Evaluation (Signed)
Anesthesia Post Note  Patient: Lisa Fuentes  Procedure(s) Performed: Procedure(s) (LRB): ARTHROSCOPY  LEFT KNEE, PARTIAL LATERAL  MENISECTOMY, SYNOVECTOMY, MEDIAL & LATERAL CHONDROPLASTY (Left)  Patient location during evaluation: PACU Anesthesia Type: General Level of consciousness: awake and alert and oriented Pain management: pain level controlled Vital Signs Assessment: post-procedure vital signs reviewed and stable Respiratory status: spontaneous breathing Cardiovascular status: blood pressure returned to baseline Anesthetic complications: no    Last Vitals:  Filed Vitals:   03/02/15 1845 03/02/15 1923  BP: 96/69 110/72  Pulse: 76 74  Temp: 36.9 C   Resp: 15 16    Last Pain:  Filed Vitals:   03/03/15 1048  PainSc: 10-Worst pain ever                 Adyen Bifulco

## 2015-03-11 ENCOUNTER — Ambulatory Visit: Payer: Medicare Other | Attending: General Surgery

## 2015-03-16 ENCOUNTER — Ambulatory Visit: Payer: Medicare Other | Admitting: General Surgery

## 2015-03-21 ENCOUNTER — Other Ambulatory Visit: Payer: Self-pay | Admitting: Internal Medicine

## 2015-03-21 DIAGNOSIS — R42 Dizziness and giddiness: Secondary | ICD-10-CM

## 2015-03-22 ENCOUNTER — Encounter: Payer: Self-pay | Admitting: Cardiovascular Disease

## 2015-03-22 ENCOUNTER — Ambulatory Visit (INDEPENDENT_AMBULATORY_CARE_PROVIDER_SITE_OTHER): Payer: Medicare HMO | Admitting: Cardiovascular Disease

## 2015-03-22 VITALS — BP 90/60 | HR 92 | Ht 67.0 in | Wt 249.8 lb

## 2015-03-22 DIAGNOSIS — Z95810 Presence of automatic (implantable) cardiac defibrillator: Secondary | ICD-10-CM

## 2015-03-22 DIAGNOSIS — I5022 Chronic systolic (congestive) heart failure: Secondary | ICD-10-CM

## 2015-03-22 DIAGNOSIS — R42 Dizziness and giddiness: Secondary | ICD-10-CM | POA: Diagnosis not present

## 2015-03-22 NOTE — Progress Notes (Signed)
Primary care physician: Dr. Rosario Jacks   HPI  This is a 55 year old African-American female who is here today for a follow-up visit regarding chronic systolic heart failure due to nonischemic cardiomyopathy. She has known history of chronic systolic heart failure diagnosed in early 2000 due to nonischemic cardiomyopathy. She had cardiac catheterization done in 2003 which showed no significant coronary artery disease. She had an ICD placement in 2008 with generator replacement in May 2015 done by Mylinda Latina. She has known history of sleep apnea, hypertension and hyperthyroidism. She received radioactive iodine treatment and currently is on thyroid replacement therapy.  Most recent echocardiogram in January 2016 showed an ejection fraction of 40-45%. She was taken off digoxin last year. Most recent nuclear stress test in January 2017  showed no evidence of ischemia with ejection fraction of 49%. She underwent hysterectomy in October 2016. She also had left knee arthroscopic few weeks ago. She has been having intermittent episodes of hypotension since then. She did have symptomatic hypotension in the past that required holding lisinopril. She complains of lightheadedness when she turns her head to the side. No palpitations.   Allergies  Allergen Reactions  . Sulfamethoxazole-Trimethoprim     Other reaction(s): Unknown Other reaction(s): Unknown     Current Outpatient Prescriptions on File Prior to Visit  Medication Sig Dispense Refill  . ALPRAZolam (XANAX) 0.25 MG tablet Take 0.25 mg by mouth daily. As needed    . amitriptyline (ELAVIL) 50 MG tablet Take 1 tablet (50 mg total) by mouth at bedtime. 30 tablet 3  . aspirin EC 325 MG tablet Take 1 tablet (325 mg total) by mouth daily. 30 tablet 0  . carvedilol (COREG) 25 MG tablet Take 1/2 tablet (12.5 mg) by mouth twice daily 60 tablet 3  . citalopram (CELEXA) 40 MG tablet TAKE ONE TABLET BY MOUTH ONCE DAILY    . docusate sodium (COLACE) 100 MG  capsule Take 1 capsule (100 mg total) by mouth 2 (two) times daily as needed for mild constipation. 30 capsule 1  . esomeprazole (NEXIUM) 40 MG capsule Take 40 mg by mouth daily at 12 noon.    . furosemide (LASIX) 40 MG tablet TAKE ONE TABLET BY MOUTH ONCE DAILY *PATIENT OCCASIONALLY TAKES AN EXTRA TABLET*    . HYDROcodone-acetaminophen (NORCO) 5-325 MG tablet Take 1-2 tablets by mouth every 6 (six) hours as needed for moderate pain. MAXIMUM TOTAL ACETAMINOPHEN DOSE IS 4000 MG PER DAY 40 tablet 0  . levothyroxine (SYNTHROID, LEVOTHROID) 125 MCG tablet Take 125 mcg by mouth daily before breakfast.     . lisinopril (PRINIVIL,ZESTRIL) 5 MG tablet TAKE ONE TABLET BY MOUTH ONCE DAILY 30 tablet 3  . loratadine (CLARITIN) 10 MG tablet Take 10 mg by mouth daily.    Marland Kitchen nystatin (MYCOSTATIN) powder Apply topically 4 (four) times daily as needed.    . simethicone (MYLICON) 80 MG chewable tablet Chew 1 tablet (80 mg total) by mouth 4 (four) times daily as needed for flatulence. 30 tablet 0  . spironolactone (ALDACTONE) 25 MG tablet Take 0.5 tablets (12.5 mg total) by mouth daily. TAKE ONE-HALF TABLET BY MOUTH ONCE DAILY 90 tablet 3   No current facility-administered medications on file prior to visit.     Past Medical History  Diagnosis Date  . Thyroid disease   . Menopausal symptoms   . Anemia   . Tachycardia   . Hypertension   . Hypokalemia   . Clotting disorder ([place])   . Nonischemic cardiomyopathy ([place])  Interval improvement EF 40-45% 1/16  . Sleep apnea     Treated with CPAP  . Implantable defibrillator     Medtronic DOI 2008, replacement 2015  . Hypercholesteremia   . Endometriosis of vagina 09/2013    Intra-operative findings of endometriosis implants on cervical stump  . Coronary artery disease   . Dysrhythmia   . Shortness of breath dyspnea     chronic doe  . Hypothyroidism   . Depression   . Anxiety     panic attacks  . Headache   . Arthritis   . Difficult intubation   . CHF  (congestive heart failure) [facility])      Past Surgical History  Procedure Laterality Date  . Cardiac defibrillator placement    . Total abdominal hysterectomy    . Cardiac catheterization  2003    [facility]: No significant coronary artery disease with reduced ejection fraction.  . Tonsillectomy    . Foot surgery    . Colonoscopy  2015  . Tubal ligation Bilateral   . Laparoscopic salpingoopherectomy Right 09/2013    also with left salpingectomy  . Coronary angioplasty    . Hand surgery    . Laparoscopic salpingo oopherectomy Left 11/15/2014    Procedure: LAPAROSCOPIC OOPHORECTOMY;  Surgeon: Rubie Maid, MD;  Location: [facility] ORS;  Service: Gynecology;  Laterality: Left;  . Trachelectomy N/A 11/15/2014    Procedure: TRACHELECTOMY;  Surgeon: Rubie Maid, MD;  Location: [facility] ORS;  Service: Gynecology;  Laterality: N/A;  . Cystoscopy  11/15/2014    Procedure: CYSTOSCOPY;  Surgeon: Rubie Maid, MD;  Location: [facility] ORS;  Service: Gynecology;;  . Knee arthroscopy Left 03/02/2015    Procedure: ARTHROSCOPY  LEFT KNEE, PARTIAL LATERAL  MENISECTOMY, SYNOVECTOMY, MEDIAL & LATERAL CHONDROPLASTY;  Surgeon: Thornton Park, MD;  Location: [facility] ORS;  Service: Orthopedics;  Laterality: Left;  Dr. Mack Guise wanted her surgery switched from [facility] because of the Defibrillator.  patient has a Defibrillator  takes Asprin/knows to stop 5days before  has had some test done     Family History  Problem Relation Age of Onset  . Hypertension Mother   . Hyperlipidemia Mother   . Stroke Mother   . Colon cancer Mother      Social History   Social History  . Marital Status: Divorced    Spouse Name: N/A  . Number of Children: N/A  . Years of Education: N/A   Occupational History  . Not on file.   Social History Main Topics  . Smoking status: Never Smoker   . Smokeless tobacco: Never Used  . Alcohol Use: 0.0 oz/week    0 Standard drinks or equivalent per week     Comment: 5 q week  .  Drug Use: No  . Sexual Activity: Yes    Birth Control/ Protection: None   Other Topics Concern  . Not on file   Social History Narrative     ROS A 10 point review of system was performed. It is negative other than that mentioned in the history of present illness.   PHYSICAL EXAM   BP 90/60 mmHg  Pulse 92  Ht 5\' 7"  (1.702 m)  Wt 249 lb 12 oz (113.286 kg)  BMI 39.11 kg/m2 Constitutional: She is oriented to person, place, and time. She appears well-developed and well-nourished. No distress.  HENT: No nasal discharge.  Head: Normocephalic and atraumatic.  Eyes: Pupils are equal and round. No discharge.  Neck: Normal range of motion. Neck supple. No JVD  present. No thyromegaly present.  Cardiovascular: Normal rate, regular rhythm, normal heart sounds. Exam reveals no gallop and no friction rub. No murmur heard.  Pulmonary/Chest: Effort normal and breath sounds normal. No stridor. No respiratory distress. She has no wheezes. She has no rales. She exhibits no tenderness.  Abdominal: Soft. Bowel sounds are normal. She exhibits no distension. There is no tenderness. There is no rebound and no guarding.  Musculoskeletal: Normal range of motion. She exhibits no edema and no tenderness.  Neurological: She is alert and oriented to person, place, and time. Coordination normal.  Skin: Skin is warm and dry. No rash noted. She is not diaphoretic. No erythema. No pallor.  Psychiatric: She has a normal mood and affect. Her behavior is normal. Judgment and thought content normal.     [id number]  Rhythm , borderline LVH with nonspecific T wave changes. WITHIN NORMAL LIMITS   ASSESSMENT AND PLAN

## 2015-03-22 NOTE — Patient Instructions (Signed)
Medication Instructions:  Your physician has recommended you make the following change in your medication:  STOP taking lisinopril    Labwork: none  Testing/Procedures: Please send a remote transmission from home today. The device clinic will notify you after.  Follow-Up: Your physician recommends that you schedule a follow-up appointment in: 3 months with Dr. Fletcher Anon.   Any Other Special Instructions Will Be Listed Below (If Applicable).     If you need a refill on your cardiac medications before your next appointment, please call your pharmacy.

## 2015-03-25 ENCOUNTER — Telehealth: Payer: Self-pay

## 2015-03-25 NOTE — Telephone Encounter (Signed)
Received records request disabilty determination services, forwarded to [facility] for processing.

## 2015-03-27 DIAGNOSIS — R42 Dizziness and giddiness: Secondary | ICD-10-CM | POA: Insufficient documentation

## 2015-03-27 NOTE — Assessment & Plan Note (Signed)
The patient has known history of nonischemic cardiomyopathy. His ejection fraction is known to be mildly reduced. She seems to be having symptomatic hypotension and thus I decided to hold lisinopril for now.

## 2015-03-27 NOTE — Assessment & Plan Note (Signed)
She is having recurrent episodes of dizziness. not clear if this is due to arrhythmia. I am going to ask the device clinic to interrogate her device instead of placing a Holter monitor.

## 2015-03-27 NOTE — Assessment & Plan Note (Signed)
I agree with carotid Doppler given that some of her episodes are happening when she rotates her neck .

## 2015-03-30 ENCOUNTER — Ambulatory Visit: Payer: Medicare HMO

## 2015-03-30 DIAGNOSIS — R42 Dizziness and giddiness: Secondary | ICD-10-CM

## 2015-04-01 ENCOUNTER — Telehealth: Payer: Self-pay | Admitting: Cardiology

## 2015-04-01 NOTE — Telephone Encounter (Signed)
LMOVM for pt to return call 

## 2015-04-04 NOTE — Telephone Encounter (Signed)
Returning your call from 04-01-15.

## 2015-04-06 NOTE — Telephone Encounter (Signed)
Spoke w/ pt and informed her that we were able to get her carelink information transferred to our clinic but we needed her to send a transmission. Pt informed me that she is not sure if she knows how. I instructed pt to call tech services. Pt verbalized understanding.

## 2015-04-06 NOTE — Telephone Encounter (Signed)
LMOVM for pt to return call 

## 2015-04-07 ENCOUNTER — Other Ambulatory Visit: Payer: Self-pay | Admitting: Internal Medicine

## 2015-04-07 DIAGNOSIS — Z1231 Encounter for screening mammogram for malignant neoplasm of breast: Secondary | ICD-10-CM

## 2015-04-15 ENCOUNTER — Ambulatory Visit: Payer: Medicare HMO | Attending: Internal Medicine

## 2015-04-26 ENCOUNTER — Encounter: Payer: Self-pay | Admitting: Internal Medicine

## 2015-04-26 ENCOUNTER — Ambulatory Visit (INDEPENDENT_AMBULATORY_CARE_PROVIDER_SITE_OTHER): Payer: Medicare HMO | Admitting: Internal Medicine

## 2015-04-26 ENCOUNTER — Ambulatory Visit: Payer: Medicare HMO

## 2015-04-26 VITALS — BP 100/66 | HR 79 | Temp 98.0°F | Ht 67.0 in | Wt 246.2 lb

## 2015-04-26 DIAGNOSIS — I5022 Chronic systolic (congestive) heart failure: Secondary | ICD-10-CM

## 2015-04-26 DIAGNOSIS — I10 Essential (primary) hypertension: Secondary | ICD-10-CM

## 2015-04-26 DIAGNOSIS — Z95810 Presence of automatic (implantable) cardiac defibrillator: Secondary | ICD-10-CM | POA: Diagnosis not present

## 2015-04-26 LAB — CUP PACEART INCLINIC DEVICE CHECK
HIGH POWER IMPEDANCE MEASURED VALUE: 52 Ohm
HighPow Impedance: 66 Ohm
Implantable Lead Location: 753860
Implantable Lead Model: 185
Lead Channel Impedance Value: 532 Ohm
Lead Channel Pacing Threshold Pulse Width: 0.4 ms
Lead Channel Setting Pacing Pulse Width: 0.4 ms
Lead Channel Setting Sensing Sensitivity: 0.3 mV
MDC IDC LEAD IMPLANT DT: 20150508
MDC IDC LEAD SERIAL: 194163
MDC IDC MSMT BATTERY REMAINING LONGEVITY: 124 mo
MDC IDC MSMT BATTERY VOLTAGE: 3.01 V
MDC IDC MSMT LEADCHNL RV IMPEDANCE VALUE: 532 Ohm
MDC IDC MSMT LEADCHNL RV PACING THRESHOLD AMPLITUDE: 0.75 V
MDC IDC MSMT LEADCHNL RV SENSING INTR AMPL: 12.25 mV
MDC IDC SESS DTM: 20170328123637
MDC IDC SET LEADCHNL RV PACING AMPLITUDE: 2 V
MDC IDC STAT BRADY RV PERCENT PACED: 0.01 %

## 2015-04-26 NOTE — Patient Instructions (Signed)
Medication Instructions: - Your physician recommends that you continue on your current medications as directed. Please refer to the Current Medication list given to you today.  Labwork: - none  Procedures/Testing: - none  Follow-Up: - Remote monitoring is used to monitor your Pacemaker of ICD from home. This monitoring reduces the number of office visits required to check your device to one time per year. It allows us to keep an eye on the functioning of your device to ensure it is working properly. You are scheduled for a device check from home on 07/26/15. You may send your transmission at any time that day. If you have a wireless device, the transmission will be sent automatically. After your physician reviews your transmission, you will receive a postcard with your next transmission date.  - Your physician wants you to follow-up in: 1 year with Dr. Klein. You will receive a reminder letter in the mail two months in advance. If you don't receive a letter, please call our office to schedule the follow-up appointment.  Any Additional Special Instructions Will Be Listed Below (If Applicable).     If you need a refill on your cardiac medications before your next appointment, please call your pharmacy.   

## 2015-04-26 NOTE — Progress Notes (Signed)
Patient Care Team: Casilda Carls, MD as PCP - General (Internal Medicine) Casilda Carls, MD (Internal Medicine) Christene Lye, MD (General Surgery)   HPI  Lisa Fuentes is a 55 y.o. female Seen in follow-up for ICD implanted for primary prevention for nonischemic cardiomyopathy and congestive heart failure. Done elsewhere.  She comes in wthout recent problems of chest pain or SOB, but noted last night that her legs started aching terribly  This am she has developed RUQ pain, not aggraveated by movement or breathing.  Too acute to assess relationship to eating.  Not previously  Most recent echocardiogram in January 2016 showed an ejection fraction of 40-45%. She was taken off digoxin last year. Most recent nuclear stress test in January 2017 showed no evidence of ischemia with ejection fraction of 49%.  Records and Results Reviewed notes from primary cardiology  Past Medical History  Diagnosis Date  . Thyroid disease   . Menopausal symptoms   . Anemia   . Tachycardia   . Hypertension   . Hypokalemia   . Clotting disorder ([place])   . Nonischemic cardiomyopathy (HCC)     Interval improvement EF 40-45% 1/16  . Sleep apnea     Treated with CPAP  . Implantable defibrillator     Medtronic DOI 2008, replacement 2015  . Hypercholesteremia   . Endometriosis of vagina 09/2013    Intra-operative findings of endometriosis implants on cervical stump  . Coronary artery disease   . Dysrhythmia   . Shortness of breath dyspnea     chronic doe  . Hypothyroidism   . Depression   . Anxiety     panic attacks  . Headache   . Arthritis   . Difficult intubation   . CHF (congestive heart failure) [facility])     Past Surgical History  Procedure Laterality Date  . Cardiac defibrillator placement    . Total abdominal hysterectomy    . Cardiac catheterization  2003    [facility]: No significant coronary artery disease with reduced ejection fraction.  . Tonsillectomy    . Foot  surgery    . Colonoscopy  2015  . Tubal ligation Bilateral   . Laparoscopic salpingoopherectomy Right 09/2013    also with left salpingectomy  . Coronary angioplasty    . Hand surgery    . Laparoscopic salpingo oopherectomy Left 11/15/2014    Procedure: LAPAROSCOPIC OOPHORECTOMY;  Surgeon: Rubie Maid, MD;  Location: [facility] ORS;  Service: Gynecology;  Laterality: Left;  . Trachelectomy N/A 11/15/2014    Procedure: TRACHELECTOMY;  Surgeon: Rubie Maid, MD;  Location: [facility] ORS;  Service: Gynecology;  Laterality: N/A;  . Cystoscopy  11/15/2014    Procedure: CYSTOSCOPY;  Surgeon: Rubie Maid, MD;  Location: [facility] ORS;  Service: Gynecology;;  . Knee arthroscopy Left 03/02/2015    Procedure: ARTHROSCOPY  LEFT KNEE, PARTIAL LATERAL  MENISECTOMY, SYNOVECTOMY, MEDIAL & LATERAL CHONDROPLASTY;  Surgeon: Thornton Park, MD;  Location: [facility] ORS;  Service: Orthopedics;  Laterality: Left;  Dr. Mack Guise wanted her surgery switched from [facility] because of the Defibrillator.  patient has a Defibrillator  takes Asprin/knows to stop 5days before  has had some test done    Current Outpatient Prescriptions  Medication Sig Dispense Refill  . ALPRAZolam (XANAX) 0.25 MG tablet Take 0.25 mg by mouth daily. As needed    . aspirin EC 325 MG tablet Take 1 tablet (325 mg total) by mouth daily. 30 tablet 0  . carvedilol (COREG) 25 MG tablet  Take 1/2 tablet (12.5 mg) by mouth twice daily 60 tablet 3  . citalopram (CELEXA) 40 MG tablet TAKE ONE TABLET BY MOUTH ONCE DAILY    . docusate sodium (COLACE) 100 MG capsule Take 1 capsule (100 mg total) by mouth 2 (two) times daily as needed for mild constipation. 30 capsule 1  . esomeprazole (NEXIUM) 40 MG capsule Take 40 mg by mouth daily at 12 noon.    . furosemide (LASIX) 40 MG tablet TAKE ONE TABLET BY MOUTH ONCE DAILY *PATIENT OCCASIONALLY TAKES AN EXTRA TABLET*    . HYDROcodone-acetaminophen (NORCO) 5-325 MG tablet Take 1-2 tablets by mouth every 6 (six)  hours as needed for moderate pain. MAXIMUM TOTAL ACETAMINOPHEN DOSE IS 4000 MG PER DAY 40 tablet 0  . levothyroxine (SYNTHROID, LEVOTHROID) 125 MCG tablet Take 125 mcg by mouth daily before breakfast.     . loratadine (CLARITIN) 10 MG tablet Take 10 mg by mouth daily.    Marland Kitchen nystatin (MYCOSTATIN) powder Apply topically 4 (four) times daily as needed.    . simethicone (MYLICON) 80 MG chewable tablet Chew 1 tablet (80 mg total) by mouth 4 (four) times daily as needed for flatulence. 30 tablet 0  . spironolactone (ALDACTONE) 25 MG tablet Take 0.5 tablets (12.5 mg total) by mouth daily. TAKE ONE-HALF TABLET BY MOUTH ONCE DAILY 90 tablet 3   No current facility-administered medications for this visit.    Allergies  Allergen Reactions  . Sulfamethoxazole-Trimethoprim     Other reaction(s): Unknown Other reaction(s): Unknown      Review of Systems negative except from HPI and PMH  Physical Exam BP 100/66 mmHg  Pulse 79  Ht 5\' 7"  (1.702 m)  Wt 246 lb 4 oz (111.698 kg)  BMI 38.56 kg/m2 Well developed and well nourished in no acute distress HENT normal E scleral and icterus clear Neck Supple JVP flat; carotids brisk and full Clear to ausculation  Regular rate and rhythm, no murmurs gallops or rub Soft with active bowel sounds RUQ tenderness No clubbing cyanosis  Edema Alert and oriented, grossly normal motor and sensory function Skin Warm and Dry    Assessment and  Plan  Nonischemic cardiomyopathy with interval improvement EF most recently 40-45%  Implantable defibrillator-Medtronic  Side pain   Hypertension  Treated sleep apnea   The device function is normal  LV function has continued to improve  The cause of her pain is not clear,  There is some RUQ tenderness suggestive of hepatobiliary disease; there is no pleuritic component.  She is afebrile  She is advised to go to urgent care or the ER; we were going to watch her for awhile until her symptoms abated, but  she has chosen to leave with persistent but apparently improved discomfort

## 2015-05-09 ENCOUNTER — Ambulatory Visit: Payer: Medicare HMO | Attending: General Surgery

## 2015-05-19 ENCOUNTER — Other Ambulatory Visit: Payer: Self-pay | Admitting: General Surgery

## 2015-05-19 ENCOUNTER — Ambulatory Visit
Admission: RE | Admit: 2015-05-19 | Discharge: 2015-05-19 | Disposition: A | Discharge status: Home / Self Care | Payer: Medicare HMO | Source: Ambulatory Visit | Attending: General Surgery | Admitting: General Surgery

## 2015-05-19 ENCOUNTER — Other Ambulatory Visit: Payer: Self-pay | Admitting: Internal Medicine

## 2015-05-19 DIAGNOSIS — Z1231 Encounter for screening mammogram for malignant neoplasm of breast: Secondary | ICD-10-CM

## 2015-05-23 ENCOUNTER — Ambulatory Visit
Admission: RE | Admit: 2015-05-23 | Discharge: 2015-05-23 | Disposition: A | Discharge status: Home / Self Care | Payer: Medicare HMO | Source: Ambulatory Visit | Attending: Internal Medicine | Admitting: Internal Medicine

## 2015-05-23 ENCOUNTER — Other Ambulatory Visit: Payer: Self-pay | Admitting: Internal Medicine

## 2015-05-23 DIAGNOSIS — Z1231 Encounter for screening mammogram for malignant neoplasm of breast: Secondary | ICD-10-CM | POA: Insufficient documentation

## 2015-05-26 ENCOUNTER — Encounter: Payer: Self-pay | Admitting: *Deleted

## 2015-05-31 ENCOUNTER — Encounter: Payer: Self-pay | Admitting: Internal Medicine

## 2015-06-14 ENCOUNTER — Telehealth: Payer: Self-pay

## 2015-06-14 NOTE — Telephone Encounter (Signed)
Please inform patient on keep an eye on the bleeding. Now that she has no hormones, could be related to vaginal atrophy.  If bleeding episodes continue or become heavier, would suggest making an appointment.

## 2015-06-14 NOTE — Telephone Encounter (Signed)
Pt states she is having vaginal bleeding. X 1 day. Light bleeding. Only using one panty lining a day. NO UTI sx. NO fever. IC months agos. BM on Saturday with help of laxative.  No pain. S/p hyst- 05/2014.

## 2015-06-14 NOTE — Telephone Encounter (Signed)
Called pt informed her of information below. Pt gave verbal understanding.  

## 2015-06-20 ENCOUNTER — Encounter: Payer: Self-pay | Admitting: *Deleted

## 2015-06-20 ENCOUNTER — Ambulatory Visit: Payer: Medicare HMO | Admitting: Cardiovascular Disease

## 2015-07-04 ENCOUNTER — Ambulatory Visit: Payer: Medicare HMO | Admitting: Cardiovascular Disease

## 2015-07-05 ENCOUNTER — Encounter: Payer: Self-pay | Admitting: Cardiovascular Disease

## 2015-07-05 ENCOUNTER — Ambulatory Visit (INDEPENDENT_AMBULATORY_CARE_PROVIDER_SITE_OTHER): Payer: Medicare HMO | Admitting: Cardiovascular Disease

## 2015-07-05 VITALS — BP 102/70 | HR 87 | Ht 62.0 in | Wt 250.4 lb

## 2015-07-05 DIAGNOSIS — I5022 Chronic systolic (congestive) heart failure: Secondary | ICD-10-CM

## 2015-07-05 DIAGNOSIS — I10 Essential (primary) hypertension: Secondary | ICD-10-CM

## 2015-07-05 NOTE — Progress Notes (Signed)
Cardiology Office Note   Date:  07/05/2015   ID:  Lisa Fuentes, DOB 1960-08-11, MRN [id number]  PCP:  Casilda Carls, MD  Cardiologist:   [name], MD   Chief Complaint  Patient presents with  . other    3 month f/u. Pt C/O chest pain. Meds reviewed verbally.      History of Present Illness: Lisa Fuentes is a 55 y.o. female who presents for a follow-up visit regarding chronic systolic heart failure due to nonischemic cardiomyopathy. She has known history of chronic systolic heart failure diagnosed in early 2000 due to nonischemic cardiomyopathy. She had cardiac catheterization done in 2003 which showed no significant coronary artery disease. She had an ICD placement in 2008 with generator replacement in May 2015 done by Mylinda Latina. She has known history of sleep apnea, hypertension and hyperthyroidism. She received radioactive iodine treatment and currently is on thyroid replacement therapy.  Most recent echocardiogram in January 2016 showed an ejection fraction of 40-45%. She was taken off digoxin in 2016. Most recent nuclear stress test in January 2017  showed no evidence of ischemia with ejection fraction of 49%. During last visit, she was noted to have symptomatic hypotension which required stopping lisinopril. She was having frequent episodes of dizziness with presyncope. Since then, she had significant improvement in her symptoms. She was seen by Dr. Caryl Comes and she was found to have normal device function. No chest pain. Exertional dyspnea is stable.   Past Medical History  Diagnosis Date  . Thyroid disease   . Menopausal symptoms   . Anemia   . Tachycardia   . Hypertension   . Hypokalemia   . Clotting disorder ([place])   . Nonischemic cardiomyopathy (HCC)     Interval improvement EF 40-45% 1/16  . Sleep apnea     Treated with CPAP  . Implantable defibrillator     Medtronic DOI 2008, replacement 2015  . Hypercholesteremia   . Endometriosis of vagina 09/2013      Intra-operative findings of endometriosis implants on cervical stump  . Coronary artery disease   . Dysrhythmia   . Shortness of breath dyspnea     chronic doe  . Hypothyroidism   . Depression   . Anxiety     panic attacks  . Headache   . Arthritis   . Difficult intubation   . CHF (congestive heart failure) [facility])     Past Surgical History  Procedure Laterality Date  . Cardiac defibrillator placement    . Total abdominal hysterectomy    . Cardiac catheterization  2003    [facility]: No significant coronary artery disease with reduced ejection fraction.  . Tonsillectomy    . Foot surgery    . Colonoscopy  2015  . Tubal ligation Bilateral   . Laparoscopic salpingoopherectomy Right 09/2013    also with left salpingectomy  . Coronary angioplasty    . Hand surgery    . Laparoscopic salpingo oopherectomy Left 11/15/2014    Procedure: LAPAROSCOPIC OOPHORECTOMY;  Surgeon: Rubie Maid, MD;  Location: [facility] ORS;  Service: Gynecology;  Laterality: Left;  . Trachelectomy N/A 11/15/2014    Procedure: TRACHELECTOMY;  Surgeon: Rubie Maid, MD;  Location: [facility] ORS;  Service: Gynecology;  Laterality: N/A;  . Cystoscopy  11/15/2014    Procedure: CYSTOSCOPY;  Surgeon: Rubie Maid, MD;  Location: [facility] ORS;  Service: Gynecology;;  . Knee arthroscopy Left 03/02/2015    Procedure: ARTHROSCOPY  LEFT KNEE, PARTIAL LATERAL  MENISECTOMY, SYNOVECTOMY, MEDIAL &  LATERAL CHONDROPLASTY;  Surgeon: Thornton Park, MD;  Location: [facility] ORS;  Service: Orthopedics;  Laterality: Left;  Dr. Mack Guise wanted her surgery switched from [facility] because of the Defibrillator.  patient has a Defibrillator  takes Asprin/knows to stop 5days before  has had some test done  . Breast cyst aspiration Left 03/22/14    neg/ done by Dr Jamal Collin     Current Outpatient Prescriptions  Medication Sig Dispense Refill  . ALPRAZolam (XANAX) 0.25 MG tablet Take 0.25 mg by mouth daily. As needed    . aspirin EC 325 MG  tablet Take 1 tablet (325 mg total) by mouth daily. 30 tablet 0  . carvedilol (COREG) 25 MG tablet Take 1/2 tablet (12.5 mg) by mouth twice daily 60 tablet 3  . citalopram (CELEXA) 40 MG tablet TAKE ONE TABLET BY MOUTH ONCE DAILY    . docusate sodium (COLACE) 100 MG capsule Take 1 capsule (100 mg total) by mouth 2 (two) times daily as needed for mild constipation. 30 capsule 1  . esomeprazole (NEXIUM) 40 MG capsule Take 40 mg by mouth daily at 12 noon.    . furosemide (LASIX) 40 MG tablet TAKE ONE TABLET BY MOUTH ONCE DAILY *PATIENT OCCASIONALLY TAKES AN EXTRA TABLET*    . HYDROcodone-acetaminophen (NORCO) 5-325 MG tablet Take 1-2 tablets by mouth every 6 (six) hours as needed for moderate pain. MAXIMUM TOTAL ACETAMINOPHEN DOSE IS 4000 MG PER DAY 40 tablet 0  . levothyroxine (SYNTHROID, LEVOTHROID) 125 MCG tablet Take 125 mcg by mouth daily before breakfast.     . loratadine (CLARITIN) 10 MG tablet Take 10 mg by mouth daily.    Marland Kitchen nystatin (MYCOSTATIN) powder Apply topically 4 (four) times daily as needed.    . simethicone (MYLICON) 80 MG chewable tablet Chew 1 tablet (80 mg total) by mouth 4 (four) times daily as needed for flatulence. 30 tablet 0  . spironolactone (ALDACTONE) 25 MG tablet Take 0.5 tablets (12.5 mg total) by mouth daily. TAKE ONE-HALF TABLET BY MOUTH ONCE DAILY 90 tablet 3   No current facility-administered medications for this visit.    Allergies:   Sulfamethoxazole-trimethoprim    Social History:  The patient  reports that she has never smoked. She has never used smokeless tobacco. She reports that she drinks alcohol. She reports that she does not use illicit drugs.   Family History:  The patient's family history includes Colon cancer in her mother; Hyperlipidemia in her mother; Hypertension in her mother; Stroke in her mother.    ROS:  Please see the history of present illness.   Otherwise, review of systems are positive for none.   All other systems are reviewed and  negative.    PHYSICAL EXAM: VS:  There were no vitals taken for this visit. , BMI There is no weight on file to calculate BMI. GEN: Well nourished, well developed, in no acute distress HEENT: normal Neck: no JVD, carotid bruits, or masses Cardiac: RRR; no murmurs, rubs, or gallops,no edema  Respiratory:  clear to auscultation bilaterally, normal work of breathing GI: soft, nontender, nondistended, + BS MS: no deformity or atrophy Skin: warm and dry, no rash Neuro:  Strength and sensation are intact Psych: euthymic mood, full affect   EKG:  EKG is not ordered today.    Recent Labs: 03/01/2015: ALT 15 03/03/2015: BUN 22*; Creatinine, Ser 0.79; Hemoglobin 13.0; Platelets 160; Potassium 3.7; Sodium 139    Lipid Panel No results found for: CHOL, TRIG, HDL, CHOLHDL, VLDL,  [place], LDLDIRECT    Wt Readings from Last 3 Encounters:  04/26/15 246 lb 4 oz (111.698 kg)  03/22/15 249 lb 12 oz (113.286 kg)  03/03/15 250 lb (113.399 kg)       ASSESSMENT AND PLAN:  1.   Chronic systolic heart failure:She is doing well at the present time and continues to be in Scarbro class II. Continue treatment with carvedilol and spironolactone. Blood pressure continues to be on the low side and thus I'm not going to resume lisinopril at the present time. Hopefully we can do this in the near future.  2. Status post ICD placement: Followed by Dr. Caryl Comes  Disposition:   FU with me in 6 months  Signed,  [name], MD  07/05/2015 2:03 PM    [place]

## 2015-07-05 NOTE — Patient Instructions (Signed)
Medication Instructions: Continue same medications.   Labwork: None.   Procedures/Testing: None.   Follow-Up: 6 months with Dr. Laynie Espy.   Any Additional Special Instructions Will Be Listed Below (If Applicable).     If you need a refill on your cardiac medications before your next appointment, please call your pharmacy.   

## 2015-07-25 ENCOUNTER — Ambulatory Visit: Payer: Medicare HMO | Admitting: Urology

## 2015-07-26 ENCOUNTER — Encounter: Payer: Medicare HMO | Admitting: *Deleted

## 2015-07-26 ENCOUNTER — Telehealth: Payer: Self-pay | Admitting: Cardiology

## 2015-07-26 NOTE — Telephone Encounter (Signed)
LMOVM reminding pt to send remote transmission.   

## 2015-07-29 ENCOUNTER — Encounter: Payer: Self-pay | Admitting: Cardiology

## 2015-08-03 ENCOUNTER — Telehealth: Payer: Self-pay | Admitting: Gastroenterology

## 2015-08-03 NOTE — Telephone Encounter (Signed)
colonoscopy

## 2015-08-09 NOTE — Telephone Encounter (Signed)
Called patient and left a voicemail. Colonoscopy appointment not scheduled.

## 2015-08-12 NOTE — Telephone Encounter (Signed)
Sent patient a notification letter to their address on file

## 2015-08-15 NOTE — Telephone Encounter (Signed)
Called & lvm. Notification letter was sent on 08/02/15.

## 2015-08-17 ENCOUNTER — Ambulatory Visit: Payer: Medicare HMO | Admitting: Urology

## 2015-08-18 ENCOUNTER — Telehealth: Payer: Self-pay

## 2015-08-18 ENCOUNTER — Other Ambulatory Visit: Payer: Self-pay

## 2015-08-18 NOTE — Telephone Encounter (Signed)
Gastroenterology Pre-Procedure Review  Request Date: 10/04/2015 Requesting Physician:   PATIENT REVIEW QUESTIONS: The patient responded to the following health history questions as indicated:    1. Are you having any GI issues? yes (Stomach pain, constipation ) 2. Do you have a personal history of Polyps? no 3. Do you have a family history of Colon Cancer or Polyps? yes (mother, colon cancer, sister polyps, benign ) 4. Diabetes Mellitus? no 5. Joint replacements in the past 12 months?no 6. Major health problems in the past 3 months?no 7. Any artificial heart valves, MVP, or defibrillator?yes (yes, defibrillator)    MEDICATIONS & ALLERGIES:    Patient reports the following regarding taking any anticoagulation/antiplatelet therapy:   Plavix, Coumadin, Eliquis, Xarelto, Lovenox, Pradaxa, Brilinta, or Effient? no Aspirin? yes (blood thinner )  Patient confirms/reports the following medications:  Current Outpatient Prescriptions  Medication Sig Dispense Refill  . ALPRAZolam (XANAX) 0.25 MG tablet Take 0.25 mg by mouth daily. As needed    . aspirin EC 325 MG tablet Take 1 tablet (325 mg total) by mouth daily. 30 tablet 0  . carvedilol (COREG) 25 MG tablet Take 1/2 tablet (12.5 mg) by mouth twice daily 60 tablet 3  . citalopram (CELEXA) 40 MG tablet TAKE ONE TABLET BY MOUTH ONCE DAILY    . esomeprazole (NEXIUM) 40 MG capsule Take 40 mg by mouth daily at 12 noon.    . furosemide (LASIX) 40 MG tablet TAKE ONE TABLET BY MOUTH ONCE DAILY *PATIENT OCCASIONALLY TAKES AN EXTRA TABLET*    . levothyroxine (SYNTHROID, LEVOTHROID) 112 MCG tablet     . levothyroxine (SYNTHROID, LEVOTHROID) 125 MCG tablet Take 125 mcg by mouth daily before breakfast.     . loratadine (CLARITIN) 10 MG tablet Take 10 mg by mouth daily.    . meloxicam (MOBIC) 7.5 MG tablet     . spironolactone (ALDACTONE) 25 MG tablet Take 0.5 tablets (12.5 mg total) by mouth daily. TAKE ONE-HALF TABLET BY MOUTH ONCE DAILY 90 tablet 3  .  docusate sodium (COLACE) 100 MG capsule Take 1 capsule (100 mg total) by mouth 2 (two) times daily as needed for mild constipation. (Patient not taking: Reported on 07/05/2015) 30 capsule 1  . HYDROcodone-acetaminophen (NORCO) 5-325 MG tablet Take 1-2 tablets by mouth every 6 (six) hours as needed for moderate pain. MAXIMUM TOTAL ACETAMINOPHEN DOSE IS 4000 MG PER DAY (Patient not taking: Reported on 07/05/2015) 40 tablet 0  . nystatin (MYCOSTATIN) powder Apply topically 4 (four) times daily as needed. Reported on 08/18/2015    . simethicone (MYLICON) 80 MG chewable tablet Chew 1 tablet (80 mg total) by mouth 4 (four) times daily as needed for flatulence. (Patient not taking: Reported on 07/05/2015) 30 tablet 0   No current facility-administered medications for this visit.    Patient confirms/reports the following allergies:  Allergies  Allergen Reactions  . Sulfamethoxazole-Trimethoprim     Other reaction(s): Unknown Other reaction(s): Unknown    No orders of the defined types were placed in this encounter.    AUTHORIZATION INFORMATION Primary Insurance: 1D#: Group #:  Secondary Insurance: 1D#: Group #:  SCHEDULE INFORMATION: Date: 10/04/2015 Time: Location: [facility]

## 2015-09-01 ENCOUNTER — Encounter: Payer: Self-pay | Admitting: Urology

## 2015-09-01 ENCOUNTER — Ambulatory Visit: Payer: Medicare HMO | Admitting: Urology

## 2015-10-04 ENCOUNTER — Ambulatory Visit
Admission: RE | Admit: 2015-10-04 | Discharge: 2015-10-04 | Disposition: A | Discharge status: Home / Self Care | Payer: Medicare HMO | Source: Ambulatory Visit | Attending: Gastroenterology | Admitting: Gastroenterology

## 2015-10-04 ENCOUNTER — Ambulatory Visit: Payer: Medicare HMO | Admitting: Anesthesiology

## 2015-10-04 ENCOUNTER — Encounter
Admission: RE | Disposition: A | Discharge status: Home / Self Care | Payer: Self-pay | Source: Ambulatory Visit | Attending: Gastroenterology

## 2015-10-04 DIAGNOSIS — I2510 Atherosclerotic heart disease of native coronary artery without angina pectoris: Secondary | ICD-10-CM | POA: Insufficient documentation

## 2015-10-04 DIAGNOSIS — I509 Heart failure, unspecified: Secondary | ICD-10-CM | POA: Diagnosis not present

## 2015-10-04 DIAGNOSIS — G4730 Sleep apnea, unspecified: Secondary | ICD-10-CM | POA: Insufficient documentation

## 2015-10-04 DIAGNOSIS — Z1211 Encounter for screening for malignant neoplasm of colon: Secondary | ICD-10-CM | POA: Diagnosis present

## 2015-10-04 DIAGNOSIS — E039 Hypothyroidism, unspecified: Secondary | ICD-10-CM | POA: Diagnosis not present

## 2015-10-04 DIAGNOSIS — I110 Hypertensive heart disease with heart failure: Secondary | ICD-10-CM | POA: Diagnosis not present

## 2015-10-04 DIAGNOSIS — Z800 Family history of malignant neoplasm of digestive organs: Secondary | ICD-10-CM

## 2015-10-04 DIAGNOSIS — Z7982 Long term (current) use of aspirin: Secondary | ICD-10-CM | POA: Insufficient documentation

## 2015-10-04 DIAGNOSIS — I429 Cardiomyopathy, unspecified: Secondary | ICD-10-CM | POA: Diagnosis not present

## 2015-10-04 HISTORY — PX: COLONOSCOPY WITH PROPOFOL: SHX5780

## 2015-10-04 LAB — GLUCOSE, CAPILLARY: Glucose-Capillary: 91 mg/dL (ref 65–99)

## 2015-10-04 SURGERY — COLONOSCOPY WITH PROPOFOL
Anesthesia: General

## 2015-10-04 MED ORDER — PROPOFOL 10 MG/ML IV BOLUS
INTRAVENOUS | Status: DC | PRN
  Administered 2015-10-04 (×2): 50 mg via INTRAVENOUS

## 2015-10-04 MED ORDER — PROPOFOL 500 MG/50ML IV EMUL
INTRAVENOUS | Status: DC | PRN
  Administered 2015-10-04: 125 ug/kg/min via INTRAVENOUS

## 2015-10-04 MED ORDER — SODIUM CHLORIDE 0.9 % IV SOLN
INTRAVENOUS | Status: DC
  Administered 2015-10-04: 09:00:00 via INTRAVENOUS

## 2015-10-04 MED ORDER — LACTATED RINGERS IV SOLN
INTRAVENOUS | Status: DC | PRN
Start: 2015-10-04 — End: 2015-10-04
  Administered 2015-10-04: 09:00:00 via INTRAVENOUS

## 2015-10-04 MED ORDER — PHENYLEPHRINE HCL 10 MG/ML IJ SOLN
INTRAMUSCULAR | Status: DC | PRN
  Administered 2015-10-04 (×2): 50 ug via INTRAVENOUS

## 2015-10-04 NOTE — Op Note (Signed)
[facility] Gastroenterology Patient Name: Lisa Fuentes Procedure Date: 10/04/2015 9:03 AM MRN: [id number] Account #: [id number] Date of Birth: 12-22-1960 Admit Type: Outpatient Age: 55 Room: [facility] ENDO ROOM 4 Gender: Female Note Status: Finalized Procedure:            Colonoscopy Indications:          Family history of anal canal cancer in a first-degree                        relative Providers:            Lucilla Lame MD, MD Referring MD:         Casilda Carls, MD (Referring MD) Medicines:            Propofol per Anesthesia Complications:        No immediate complications. Procedure:            Pre-Anesthesia Assessment:                       - Prior to the procedure, a History and Physical was                        performed, and patient medications and allergies were                        reviewed. The patient's tolerance of previous                        anesthesia was also reviewed. The risks and benefits of                        the procedure and the sedation options and risks were                        discussed with the patient. All questions were                        answered, and informed consent was obtained. Prior                        Anticoagulants: The patient has taken no previous                        anticoagulant or antiplatelet agents. ASA Grade                        Assessment: II - A patient with mild systemic disease.                        After reviewing the risks and benefits, the patient was                        deemed in satisfactory condition to undergo the                        procedure.                       After obtaining informed consent, the colonoscope was  passed under direct vision. Throughout the procedure,                        the patient's blood pressure, pulse, and oxygen                        saturations were monitored continuously. The Olympus                        CF-Q160AL  colonoscope (S#. [id number]) was introduced                        through the anus and advanced to the the cecum,                        identified by appendiceal orifice and ileocecal valve.                        The colonoscopy was performed without difficulty. The                        patient tolerated the procedure well. The quality of                        the bowel preparation was excellent. Findings:      The perianal and digital rectal examinations were normal.      The colon (entire examined portion) appeared normal. Impression:           - The entire examined colon is normal.                       - No specimens collected. Recommendation:       - Repeat colonoscopy in 5 years for surveillance. Procedure Code(s):    --- Professional ---                       [phone], Colonoscopy, flexible; diagnostic, including                        collection of specimen(s) by brushing or washing, when                        performed (separate procedure) Diagnosis Code(s):    --- Professional ---                       Z80.0, Family history of malignant neoplasm of                        digestive organs CPT copyright 2016 American Medical Association. All rights reserved. The codes documented in this report are preliminary and upon coder review may  be revised to meet current compliance requirements. Lucilla Lame MD, MD 10/04/2015 9:42:07 AM This report has been signed electronically. Number of Addenda: 0 Note Initiated On: 10/04/2015 9:03 AM Scope Withdrawal Time: 0 hours 6 minutes 7 seconds  Total Procedure Duration: 0 hours 9 minutes 41 seconds       [facility]

## 2015-10-04 NOTE — H&P (Signed)
Lucilla Lame, MD [address] [place], [place] [address] Phone: [phone] Fax : [phone]  Primary Care Physician:  Casilda Carls, MD Primary Gastroenterologist:  Dr. Allen Norris  Pre-Procedure History & Physical: HPI:  Lisa Fuentes is a 55 y.o. female is here for an colonoscopy.   Past Medical History:  Diagnosis Date  . Anemia   . Anxiety    panic attacks  . Arthritis   . CHF (congestive heart failure) ([place])   . Clotting disorder ([place])   . Coronary artery disease   . Depression   . Difficult intubation   . Dysrhythmia   . Endometriosis of vagina 09/2013   Intra-operative findings of endometriosis implants on cervical stump  . Headache   . Hypercholesteremia   . Hypertension   . Hypokalemia   . Hypothyroidism   . Implantable defibrillator    Medtronic DOI 2008, replacement 2015  . Menopausal symptoms   . Nonischemic cardiomyopathy (HCC)    Interval improvement EF 40-45% 1/16  . Shortness of breath dyspnea    chronic doe  . Sleep apnea    Treated with CPAP  . Tachycardia   . Thyroid disease     Past Surgical History:  Procedure Laterality Date  . BREAST CYST ASPIRATION Left 03/22/14   neg/ done by Dr Jamal Collin  . CARDIAC CATHETERIZATION  2003   [facility]: No significant coronary artery disease with reduced ejection fraction.  Marland Kitchen CARDIAC DEFIBRILLATOR PLACEMENT    . COLONOSCOPY  2015  . CORONARY ANGIOPLASTY    . CYSTOSCOPY  11/15/2014   Procedure: CYSTOSCOPY;  Surgeon: Rubie Maid, MD;  Location: [facility] ORS;  Service: Gynecology;;  . FOOT SURGERY    . HAND SURGERY    . KNEE ARTHROSCOPY Left 03/02/2015   Procedure: ARTHROSCOPY  LEFT KNEE, PARTIAL LATERAL  MENISECTOMY, SYNOVECTOMY, MEDIAL & LATERAL CHONDROPLASTY;  Surgeon: Thornton Park, MD;  Location: [facility] ORS;  Service: Orthopedics;  Laterality: Left;  Dr. Mack Guise wanted her surgery switched from [facility] because of the Defibrillator.  patient has a Defibrillator  takes Asprin/knows to  stop 5days before  has had some test done  . LAPAROSCOPIC SALPINGO OOPHERECTOMY Left 11/15/2014   Procedure: LAPAROSCOPIC OOPHORECTOMY;  Surgeon: Rubie Maid, MD;  Location: [facility] ORS;  Service: Gynecology;  Laterality: Left;  . LAPAROSCOPIC SALPINGOOPHERECTOMY Right 09/2013   also with left salpingectomy  . TONSILLECTOMY    . TOTAL ABDOMINAL HYSTERECTOMY    . TRACHELECTOMY N/A 11/15/2014   Procedure: TRACHELECTOMY;  Surgeon: Rubie Maid, MD;  Location: [facility] ORS;  Service: Gynecology;  Laterality: N/A;  . TUBAL LIGATION Bilateral     Prior to Admission medications   Medication Sig Start Date End Date Taking? Authorizing Provider  ALPRAZolam (XANAX) 0.25 MG tablet Take 0.25 mg by mouth daily. As needed   Yes Historical Provider, MD  aspirin EC 325 MG tablet Take 1 tablet (325 mg total) by mouth daily. 03/02/15  Yes Thornton Park, MD  carvedilol (COREG) 25 MG tablet Take 1/2 tablet (12.5 mg) by mouth twice daily 01/11/15  Yes Wellington Hampshire, MD  docusate sodium (COLACE) 100 MG capsule Take 1 capsule (100 mg total) by mouth 2 (two) times daily as needed for mild constipation. 11/16/14  Yes Rubie Maid, MD  esomeprazole (NEXIUM) 40 MG capsule Take 40 mg by mouth daily at 12 noon.   Yes Historical Provider, MD  furosemide (LASIX) 40 MG tablet TAKE ONE TABLET BY MOUTH ONCE DAILY *PATIENT OCCASIONALLY TAKES AN EXTRA TABLET* 12/02/13  Yes Historical Provider, MD  HYDROcodone-acetaminophen (NORCO) 5-325 MG tablet Take 1-2 tablets by mouth every 6 (six) hours as needed for moderate pain. MAXIMUM TOTAL ACETAMINOPHEN DOSE IS 4000 MG PER DAY 03/02/15  Yes Thornton Park, MD  levothyroxine (SYNTHROID, LEVOTHROID) 112 MCG tablet  08/08/15  Yes Historical Provider, MD  levothyroxine (SYNTHROID, LEVOTHROID) 125 MCG tablet Take 125 mcg by mouth daily before breakfast.    Yes Historical Provider, MD  meloxicam (MOBIC) 7.5 MG tablet  07/14/15  Yes Historical Provider, MD  nystatin (MYCOSTATIN) powder Apply  topically 4 (four) times daily as needed. Reported on 08/18/2015   Yes Historical Provider, MD  spironolactone (ALDACTONE) 25 MG tablet Take 0.5 tablets (12.5 mg total) by mouth daily. TAKE ONE-HALF TABLET BY MOUTH ONCE DAILY 10/08/14  Yes Wellington Hampshire, MD  citalopram (CELEXA) 40 MG tablet TAKE ONE TABLET BY MOUTH ONCE DAILY 11/05/13   Historical Provider, MD  loratadine (CLARITIN) 10 MG tablet Take 10 mg by mouth daily.    Historical Provider, MD  simethicone (MYLICON) 80 MG chewable tablet Chew 1 tablet (80 mg total) by mouth 4 (four) times daily as needed for flatulence. Patient not taking: Reported on 07/05/2015 11/16/14   Rubie Maid, MD    Allergies as of 08/18/2015 - Review Complete 07/05/2015  Allergen Reaction Noted  . Sulfamethoxazole-trimethoprim  02/02/2014    Family History  Problem Relation Age of Onset  . Hypertension Mother   . Hyperlipidemia Mother   . Stroke Mother   . Colon cancer Mother     Social History   Social History  . Marital status: Divorced    Spouse name: N/A  . Number of children: N/A  . Years of education: N/A   Occupational History  . Not on file.   Social History Main Topics  . Smoking status: Never Smoker  . Smokeless tobacco: Never Used  . Alcohol use 0.0 oz/week     Comment: 5 q week  . Drug use: No  . Sexual activity: Yes    Birth control/ protection: None   Other Topics Concern  . Not on file   Social History Narrative  . No narrative on file    Review of Systems: See HPI, otherwise negative ROS  Physical Exam: BP 120/85   Pulse 84   Temp 97.2 F (36.2 C) (Tympanic)   Resp 20   Ht 5\' 7"  (1.702 m)   Wt 250 lb (113.4 kg)   SpO2 98%   BMI 39.16 kg/m  General:   Alert,  pleasant and cooperative in NAD Head:  Normocephalic and atraumatic. Neck:  Supple; no masses or thyromegaly. Lungs:  Clear throughout to auscultation.    Heart:  Regular rate and rhythm. Abdomen:  Soft, nontender and nondistended. Normal bowel  sounds, without guarding, and without rebound.   Neurologic:  Alert and  oriented x4;  grossly normal neurologically.  Impression/Plan: Lisa Fuentes is here for an colonoscopy to be performed for family history of colon cancer.  Risks, benefits, limitations, and alternatives regarding  colonoscopy have been reviewed with the patient.  Questions have been answered.  All parties agreeable.   Lucilla Lame, MD  10/04/2015, 9:02 AM

## 2015-10-04 NOTE — Anesthesia Preprocedure Evaluation (Addendum)
Anesthesia Evaluation  Patient identified by MRN, date of birth, ID band Patient awake    Reviewed: Allergy & Precautions, NPO status , Patient's Chart, lab work & pertinent test results  History of Anesthesia Complications (+) DIFFICULT AIRWAY and history of anesthetic complications  Airway Mallampati: III  TM Distance: >3 FB Neck ROM: Full    Dental  (+) Poor Dentition, Missing, Partial Upper   Pulmonary shortness of breath and with exertion, sleep apnea , neg COPD,    breath sounds clear to auscultation- rhonchi (-) wheezing      Cardiovascular Exercise Tolerance: Good hypertension, Pt. on medications and Pt. on home beta blockers + CAD and +CHF  + Cardiac Defibrillator  Rhythm:Regular Rate:Normal - Systolic murmurs and - Diastolic murmurs Echo [id number]: - Left ventricle: The cavity size was normal. Systolic function was mildly to moderately reduced. The estimated ejection fraction was in the range of 40% to 45%. Regional wall motion abnormalities cannot be excluded. Doppler parameters are consistent with abnormal left ventricular relaxation (grade 1 diastolic dysfunction). - Left atrium: The atrium was normal in size. - Right ventricle: Pacer wire or catheter noted in right ventricle. Systolic function was normal. - Pulmonary arteries: Systolic pressure was within the normal range.   Neuro/Psych  Headaches, Anxiety Depression    GI/Hepatic   Endo/Other    Renal/GU      Musculoskeletal   Abdominal (+) + obese,   Peds  Hematology   Anesthesia Other Findings   Reproductive/Obstetrics                            Anesthesia Physical Anesthesia Plan  ASA: III  Anesthesia Plan: General   Post-op Pain Management:    Induction: Intravenous  Airway Management Planned: Natural Airway  Additional Equipment:   Intra-op Plan:   Post-operative Plan:   Informed  Consent: I have reviewed the patients History and Physical, chart, labs and discussed the procedure including the risks, benefits and alternatives for the proposed anesthesia with the patient or authorized representative who has indicated his/her understanding and acceptance.   Dental advisory given  Plan Discussed with: CRNA and Anesthesiologist  Anesthesia Plan Comments:         Anesthesia Quick Evaluation

## 2015-10-04 NOTE — Transfer of Care (Signed)
Immediate Anesthesia Transfer of Care Note  Patient: Lisa Fuentes  Procedure(s) Performed: Procedure(s): COLONOSCOPY WITH PROPOFOL (N/A)  Patient Location: PACU and Endoscopy Unit  Anesthesia Type:General  Level of Consciousness: awake, alert  and oriented  Airway & Oxygen Therapy: Patient Spontanous Breathing and Patient connected to nasal cannula oxygen  Post-op Assessment: Report given to RN and Post -op Vital signs reviewed and stable  Post vital signs: Reviewed and stable  Last Vitals:  Vitals:   10/04/15 0855  BP: 120/85  Pulse: 84  Resp: 20  Temp: 36.2 C    Last Pain:  Vitals:   10/04/15 0855  TempSrc: Tympanic         Complications: No apparent anesthesia complications

## 2015-10-04 NOTE — Anesthesia Postprocedure Evaluation (Signed)
Anesthesia Post Note  Patient: Lisa Fuentes  Procedure(s) Performed: Procedure(s) (LRB): COLONOSCOPY WITH PROPOFOL (N/A)  Patient location during evaluation: Endoscopy Anesthesia Type: General Level of consciousness: awake and alert and oriented Pain management: pain level controlled Vital Signs Assessment: post-procedure vital signs reviewed and stable Respiratory status: spontaneous breathing, nonlabored ventilation and respiratory function stable Cardiovascular status: blood pressure returned to baseline and stable Postop Assessment: no signs of nausea or vomiting Anesthetic complications: no    Last Vitals:  Vitals:   10/04/15 1000 10/04/15 1010  BP: (!) 89/52 103/88  Pulse:    Resp:    Temp:      Last Pain:  Vitals:   10/04/15 0940  TempSrc: Tympanic                 Geoffrey Mankin

## 2015-10-05 ENCOUNTER — Encounter: Payer: Self-pay | Admitting: Gastroenterology

## 2015-10-09 ENCOUNTER — Emergency Department: Payer: Medicare HMO

## 2015-10-09 ENCOUNTER — Encounter: Payer: Self-pay | Admitting: Emergency Medicine

## 2015-10-09 ENCOUNTER — Emergency Department
Admission: EM | Admit: 2015-10-09 | Discharge: 2015-10-09 | Disposition: A | Discharge status: Home / Self Care | Payer: Medicare HMO | Attending: Student | Admitting: Student

## 2015-10-09 DIAGNOSIS — W19XXXA Unspecified fall, initial encounter: Secondary | ICD-10-CM

## 2015-10-09 DIAGNOSIS — Z7982 Long term (current) use of aspirin: Secondary | ICD-10-CM | POA: Insufficient documentation

## 2015-10-09 DIAGNOSIS — Y999 Unspecified external cause status: Secondary | ICD-10-CM | POA: Insufficient documentation

## 2015-10-09 DIAGNOSIS — I2510 Atherosclerotic heart disease of native coronary artery without angina pectoris: Secondary | ICD-10-CM | POA: Insufficient documentation

## 2015-10-09 DIAGNOSIS — E039 Hypothyroidism, unspecified: Secondary | ICD-10-CM | POA: Insufficient documentation

## 2015-10-09 DIAGNOSIS — I5022 Chronic systolic (congestive) heart failure: Secondary | ICD-10-CM | POA: Diagnosis not present

## 2015-10-09 DIAGNOSIS — W07XXXA Fall from chair, initial encounter: Secondary | ICD-10-CM | POA: Diagnosis not present

## 2015-10-09 DIAGNOSIS — M542 Cervicalgia: Secondary | ICD-10-CM | POA: Diagnosis not present

## 2015-10-09 DIAGNOSIS — Z79899 Other long term (current) drug therapy: Secondary | ICD-10-CM | POA: Diagnosis not present

## 2015-10-09 DIAGNOSIS — Z95810 Presence of automatic (implantable) cardiac defibrillator: Secondary | ICD-10-CM | POA: Insufficient documentation

## 2015-10-09 DIAGNOSIS — I110 Hypertensive heart disease with heart failure: Secondary | ICD-10-CM | POA: Diagnosis not present

## 2015-10-09 DIAGNOSIS — R51 Headache: Secondary | ICD-10-CM | POA: Diagnosis not present

## 2015-10-09 DIAGNOSIS — Y929 Unspecified place or not applicable: Secondary | ICD-10-CM | POA: Insufficient documentation

## 2015-10-09 DIAGNOSIS — Y939 Activity, unspecified: Secondary | ICD-10-CM | POA: Diagnosis not present

## 2015-10-09 DIAGNOSIS — R519 Headache, unspecified: Secondary | ICD-10-CM

## 2015-10-09 MED ORDER — OXYCODONE HCL 5 MG PO TABS
10.0000 mg | ORAL_TABLET | Freq: Once | ORAL | Status: AC
  Administered 2015-10-09: 10 mg via ORAL
  Filled 2015-10-09: qty 2

## 2015-10-09 MED ORDER — OXYCODONE HCL 5 MG PO TABS: 5.0000 mg | ORAL_TABLET | Freq: Four times a day (QID) | ORAL | 0 refills | Status: DC | PRN

## 2015-10-09 NOTE — ED Notes (Signed)
Pt verbalized understanding of discharge instructions. NAD at this time. 

## 2015-10-09 NOTE — ED Notes (Signed)
RN applied cervical collar.

## 2015-10-09 NOTE — ED Triage Notes (Signed)
Patient states that Friday morning was sitting in a chair and the chair broke. Patient states that she fell back and hit her neck on a table. Patient states that she continues to have neck pain and pain to the back of her head.

## 2015-10-09 NOTE — ED Notes (Signed)
Patient transported to CT 

## 2015-10-09 NOTE — ED Provider Notes (Signed)
[facility] Emergency Department Provider Note   ____________________________________________   First MD Initiated Contact with Patient 10/09/15 [phone]     (approximate)  I have reviewed the triage vital signs and the nursing notes.   HISTORY  Chief Complaint Fall and Neck Pain    HPI Lisa Fuentes is a 55 y.o. female with history of CHF, coronary artery disease, thyroidism, arrhythmia status post defibrillator placement who presents for evaluation of 2 days of constant neck and occipital head pain which began after a mechanical fall, sudden onset, moderate, worse with movement of the neck. Patient reports that she was sitting on a stool that collapsed causing her to fall backwards and hit the back of her head/neck 2 days ago. She denies loss of consciousness or vomiting. Her primary complaint is of neck pain that she is having some pain in the occiput. No chest pain or difficulty breathing. No numbness or weakness in the arms or legs. No speech difficulty. She is taking Tylenol without relief.   Past Medical History:  Diagnosis Date  . Anemia   . Anxiety    panic attacks  . Arthritis   . CHF (congestive heart failure) ([place])   . Clotting disorder ([place])   . Coronary artery disease   . Depression   . Difficult intubation   . Dysrhythmia   . Endometriosis of vagina 09/2013   Intra-operative findings of endometriosis implants on cervical stump  . Headache   . Hypercholesteremia   . Hypertension   . Hypokalemia   . Hypothyroidism   . Implantable defibrillator    Medtronic DOI 2008, replacement 2015  . Menopausal symptoms   . Nonischemic cardiomyopathy (HCC)    Interval improvement EF 40-45% 1/16  . Shortness of breath dyspnea    chronic doe  . Sleep apnea    Treated with CPAP  . Tachycardia   . Thyroid disease     Patient Active Problem List   Diagnosis Date Noted  . Family history of malignant neoplasm of gastrointestinal tract   .  Dizziness 03/27/2015  . Postoperative state 11/15/2014  . Left ventricular dysfunction 10/29/2014  . Endometriosis 09/18/2014  . ICD (implantable cardioverter-defibrillator) in place 02/02/2014  . Chronic systolic heart failure ([place])   . Essential hypertension   . Hypercholesteremia   . Apnea, sleep 05/12/2013  . Hyperthyroidism 05/08/2013    Past Surgical History:  Procedure Laterality Date  . BREAST CYST ASPIRATION Left 03/22/14   neg/ done by Dr Jamal Collin  . CARDIAC CATHETERIZATION  2003   [facility]: No significant coronary artery disease with reduced ejection fraction.  Marland Kitchen CARDIAC DEFIBRILLATOR PLACEMENT    . COLONOSCOPY  2015  . COLONOSCOPY WITH PROPOFOL N/A 10/04/2015   Procedure: COLONOSCOPY WITH PROPOFOL;  Surgeon: Lucilla Lame, MD;  Location: [facility] ENDOSCOPY;  Service: Endoscopy;  Laterality: N/A;  . CORONARY ANGIOPLASTY    . CYSTOSCOPY  11/15/2014   Procedure: CYSTOSCOPY;  Surgeon: Rubie Maid, MD;  Location: [facility] ORS;  Service: Gynecology;;  . FOOT SURGERY    . HAND SURGERY    . KNEE ARTHROSCOPY Left 03/02/2015   Procedure: ARTHROSCOPY  LEFT KNEE, PARTIAL LATERAL  MENISECTOMY, SYNOVECTOMY, MEDIAL & LATERAL CHONDROPLASTY;  Surgeon: Thornton Park, MD;  Location: [facility] ORS;  Service: Orthopedics;  Laterality: Left;  Dr. Mack Guise wanted her surgery switched from [facility] because of the Defibrillator.  patient has a Defibrillator  takes Asprin/knows to stop 5days before  has had some test done  . LAPAROSCOPIC  SALPINGO OOPHERECTOMY Left 11/15/2014   Procedure: LAPAROSCOPIC OOPHORECTOMY;  Surgeon: Rubie Maid, MD;  Location: [facility] ORS;  Service: Gynecology;  Laterality: Left;  . LAPAROSCOPIC SALPINGOOPHERECTOMY Right 09/2013   also with left salpingectomy  . TONSILLECTOMY    . TOTAL ABDOMINAL HYSTERECTOMY    . TRACHELECTOMY N/A 11/15/2014   Procedure: TRACHELECTOMY;  Surgeon: Rubie Maid, MD;  Location: [facility] ORS;  Service: Gynecology;  Laterality: N/A;  . TUBAL LIGATION  Bilateral     Prior to Admission medications   Medication Sig Start Date End Date Taking? Authorizing Provider  ALPRAZolam Duanne Moron) 0.25 MG tablet Take 0.25 mg by mouth daily as needed for anxiety. As needed    Yes Historical Provider, MD  aspirin EC 325 MG tablet Take 1 tablet (325 mg total) by mouth daily. Patient taking differently: Take 325 mg by mouth every morning.  03/02/15  Yes Thornton Park, MD  carvedilol (COREG) 25 MG tablet Take 1/2 tablet (12.5 mg) by mouth twice daily 01/11/15  Yes Wellington Hampshire, MD  citalopram (CELEXA) 40 MG tablet Take 40 mg by mouth every morning. TAKE ONE TABLET BY MOUTH ONCE DAILY 11/05/13  Yes Historical Provider, MD  docusate sodium (COLACE) 100 MG capsule Take 1 capsule (100 mg total) by mouth 2 (two) times daily as needed for mild constipation. 11/16/14  Yes Rubie Maid, MD  esomeprazole (NEXIUM) 40 MG capsule Take 40 mg by mouth every morning.    Yes Historical Provider, MD  furosemide (LASIX) 40 MG tablet Take 40 mg by mouth daily. TAKE ONE TABLET BY MOUTH ONCE DAILY *PATIENT OCCASIONALLY TAKES AN EXTRA TABLET* 12/02/13  Yes Historical Provider, MD  levothyroxine (SYNTHROID, LEVOTHROID) 112 MCG tablet Take 112 mcg by mouth daily before breakfast.  08/08/15  Yes Historical Provider, MD  loratadine (CLARITIN) 10 MG tablet Take 10 mg by mouth every morning.    Yes Historical Provider, MD  nystatin (MYCOSTATIN) powder Apply topically 4 (four) times daily as needed. Reported on 08/18/2015   Yes Historical Provider, MD  spironolactone (ALDACTONE) 25 MG tablet Take 0.5 tablets (12.5 mg total) by mouth daily. TAKE ONE-HALF TABLET BY MOUTH ONCE DAILY Patient taking differently: Take 12.5 mg by mouth every morning. TAKE ONE-HALF TABLET BY MOUTH ONCE DAILY 10/08/14  Yes Wellington Hampshire, MD  HYDROcodone-acetaminophen (NORCO) 5-325 MG tablet Take 1-2 tablets by mouth every 6 (six) hours as needed for moderate pain. MAXIMUM TOTAL ACETAMINOPHEN DOSE IS 4000 MG PER  DAY Patient not taking: Reported on 10/09/2015 03/02/15   Thornton Park, MD  simethicone (MYLICON) 80 MG chewable tablet Chew 1 tablet (80 mg total) by mouth 4 (four) times daily as needed for flatulence. Patient not taking: Reported on 07/05/2015 11/16/14   Rubie Maid, MD    Allergies Sulfamethoxazole-trimethoprim  Family History  Problem Relation Age of Onset  . Hypertension Mother   . Hyperlipidemia Mother   . Stroke Mother   . Colon cancer Mother     Social History Social History  Substance Use Topics  . Smoking status: Never Smoker  . Smokeless tobacco: Never Used  . Alcohol use 0.0 oz/week     Comment: 5 q week    Review of Systems Constitutional: No fever/chills Eyes: No visual changes. ENT: No sore throat. Cardiovascular: Denies chest pain. Respiratory: Denies shortness of breath. Gastrointestinal: No abdominal pain.  No nausea, no vomiting.  No diarrhea.  No constipation. Genitourinary: Negative for dysuria. Musculoskeletal: Negative for back pain. Skin: Negative for rash. Neurological: + for headaches, no  focal weakness or numbness.  10-point ROS otherwise negative.  ____________________________________________   PHYSICAL EXAM:  VITAL SIGNS: ED Triage Vitals  Enc Vitals Group     BP 10/09/15 0330 115/72     Pulse Rate 10/09/15 0330 75     Resp 10/09/15 0330 18     Temp 10/09/15 0330 98.2 F (36.8 C)     Temp Source 10/09/15 0330 Oral     SpO2 10/09/15 0330 96 %     Weight 10/09/15 0328 250 lb (113.4 kg)     Height 10/09/15 0328 5\' 7"  (1.702 m)     Head Circumference --      Peak Flow --      Pain Score 10/09/15 0328 9     Pain Loc --      Pain Edu? --      Excl. in [place]? --     Constitutional: Alert and oriented. Well appearing and in no acute distress. Eyes: Conjunctivae are normal. PERRL. EOMI. Head: Mild tenderness in the occiput. Nose: No congestion/rhinnorhea. Mouth/Throat: Mucous membranes are moist.  Oropharynx  non-erythematous. Neck: No stridor. Mild midline and bilateral paraspinal muscle tenderness in the upper C-spine without step-off or deformity. C-collar applied in the emergency department. Cardiovascular: Normal rate, regular rhythm. Grossly normal heart sounds.  Good peripheral circulation. Respiratory: Normal respiratory effort.  No retractions. Lungs CTAB. Gastrointestinal: Soft and nontender. No distention.  No CVA tenderness. Genitourinary: deferred Musculoskeletal: No lower extremity tenderness nor edema.  No joint effusions. Neurologic:  Normal speech and language. No gross focal neurologic deficits are appreciated. No gait instability. 5 out of 5 strength in bilateral upper and lower extremity, sensation intact to light touch throughout. Skin:  Skin is warm, dry and intact. No rash noted. Psychiatric: Mood and affect are normal. Speech and behavior are normal.  ____________________________________________   LABS (all labs ordered are listed, but only abnormal results are displayed)  Labs Reviewed - No data to display ____________________________________________  EKG  none ____________________________________________  RADIOLOGY  CT head and c-spine IMPRESSION: 1. No acute intracranial abnormality. 2. No evidence for cervical spine fracture. ____________________________________________   PROCEDURES  Procedure(s) performed: None  Procedures  Critical Care performed: No  ____________________________________________   INITIAL IMPRESSION / ASSESSMENT AND PLAN / ED COURSE  Pertinent labs & imaging results that were available during my care of the patient were reviewed by me and considered in my medical decision making (see chart for details).  GINNA SOSCIA is a 55 y.o. female with history of CHF, coronary artery disease, thyroidism, arrhythmia status post defibrillator placement who presents for evaluation of 2 days of constant neck and occipital head pain which  began after a mechanical fall. On exam, she is generally well-appearing and in no acute distress. Her vital signs are stable she is afebrile patient is an intact neurological examination. She does have midline tenderness in the upper C-spine as well as the lower occiput. We'll obtain CT head, C-spine, treat her pain and reassess for disposition. C-collar applied in the emergency department.  ----------------------------------------- 9:54 AM on 10/09/2015 ----------------------------------------- Patient reports her pain is much improved after oxycodone. CT head and C-spine are negative. C-collar cleared however patient reports that she feels better with a c-collar on because it limits her mobility of her neck. We'll discharge with a soft c-collar. I discussed with her that she needs to follow-up with her primary care doctor tomorrow for repeat evaluation. We discussed return precautions and need for close follow-up and she  is comfortable with the discharge plan. DC home.  Clinical Course     ____________________________________________   FINAL CLINICAL IMPRESSION(S) / ED DIAGNOSES  Final diagnoses:  Neck pain  Acute nonintractable headache, unspecified headache type  Fall, initial encounter      NEW MEDICATIONS STARTED DURING THIS VISIT:  New Prescriptions   No medications on file     Note:  This document was prepared using Dragon voice recognition software and may include unintentional dictation errors.    Joanne Gavel, MD 10/09/15 [phone]

## 2015-11-11 IMAGING — CT CT ABD-PELV W/ CM
2 of 5 series · 16 of 46 positions shown, 18 images · IV contrast (omnipaque)
Comparison: None.

CLINICAL DATA: RIGHT lower quadrant pain since [REDACTED].

EXAM:
CT ABDOMEN AND PELVIS WITH CONTRAST
TECHNIQUE: Multidetector CT imaging of the abdomen and pelvis was performed
using the standard protocol following bolus administration of
intravenous contrast.
CONTRAST:  100mL OMNIPAQUE IOHEXOL 300 MG/ML  SOLN

[Series 2: routine abd pel with · axial · 0.88mm/px · z∈[-496,-70]mm · 13 of 99 slices shown, 15 images]
[im 7/99  soft-tissue]
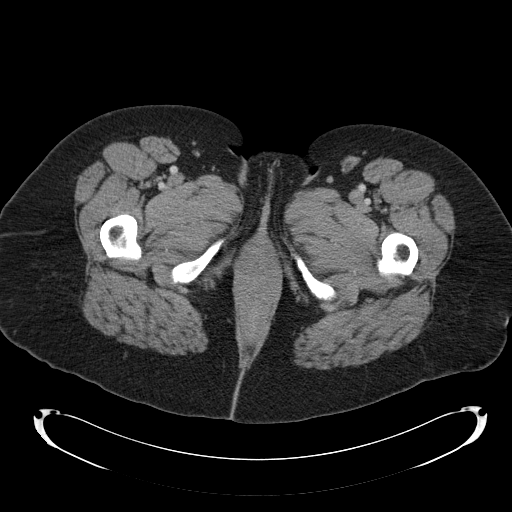
[im 7/99  bone]
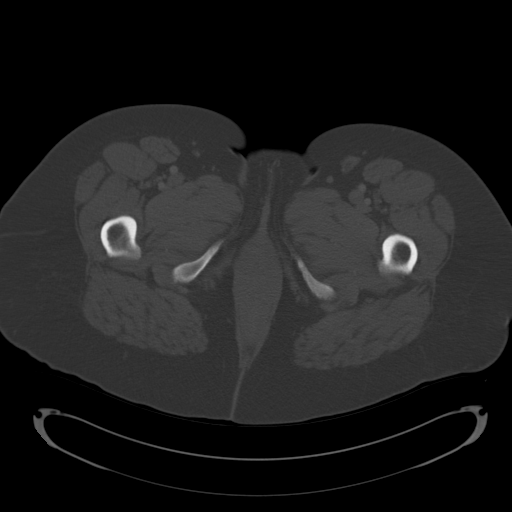
[im 14/99  soft-tissue]
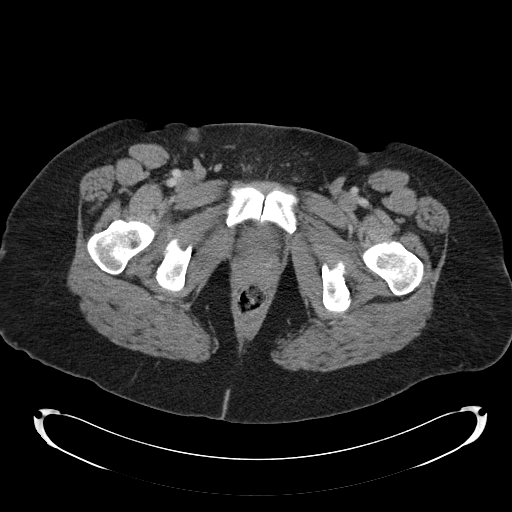
[im 20/99  soft-tissue]
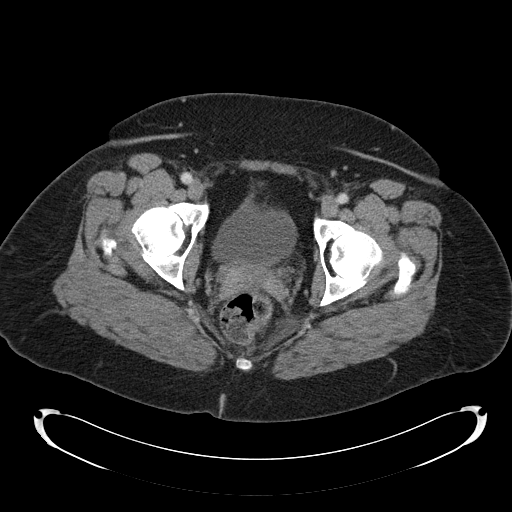
[im 27/99  soft-tissue]
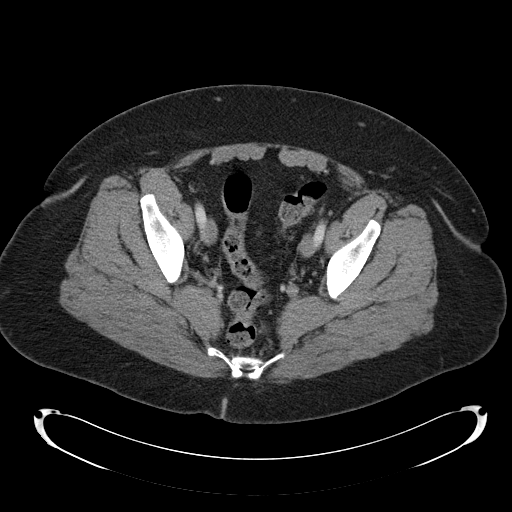
[im 33/99  soft-tissue]
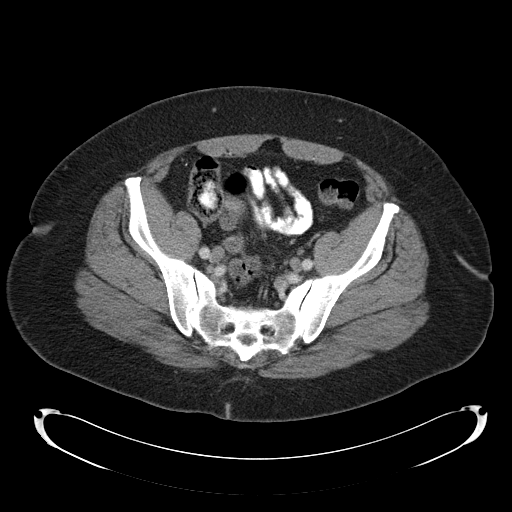
[im 40/99  soft-tissue]
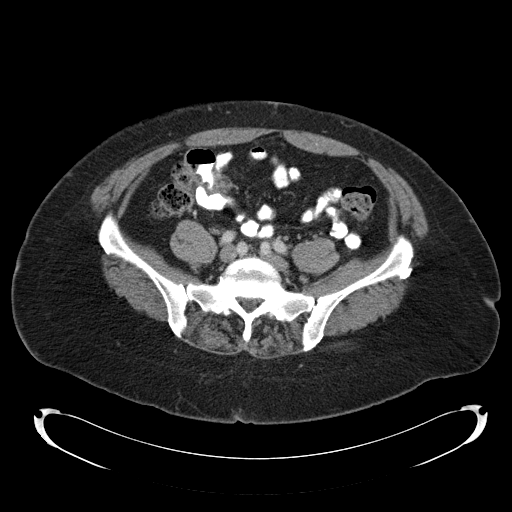
[im 53/99  soft-tissue]
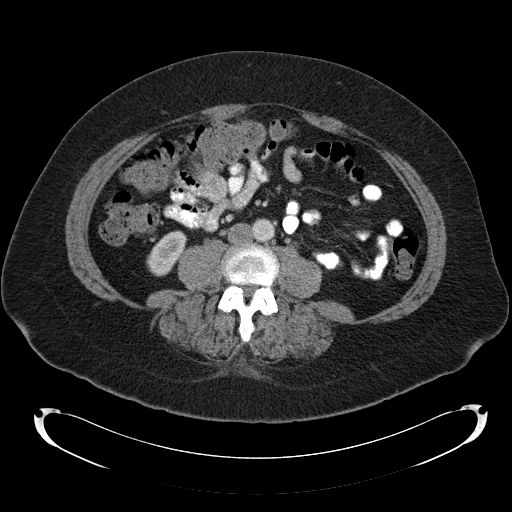
[im 59/99  soft-tissue]
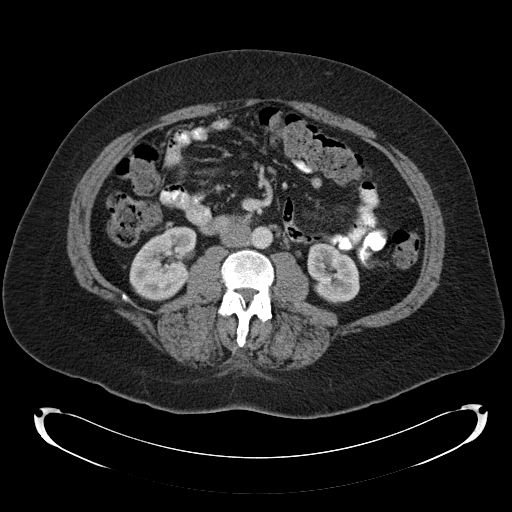
[im 66/99  soft-tissue]
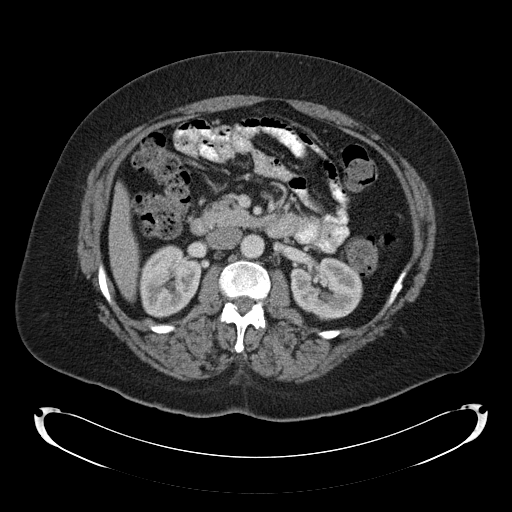
[im 66/99  bone]
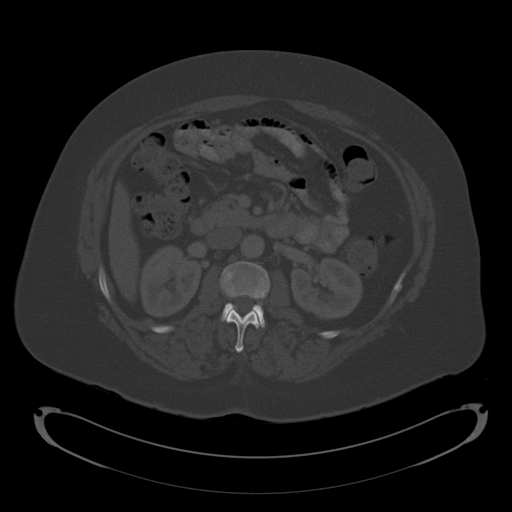
[im 72/99  soft-tissue]
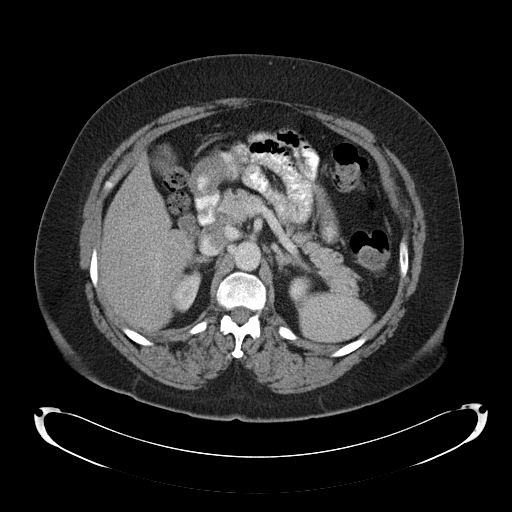
[im 79/99  soft-tissue]
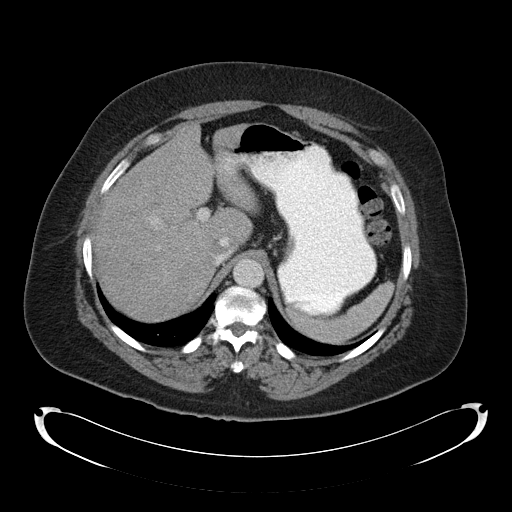
[im 85/99  soft-tissue]
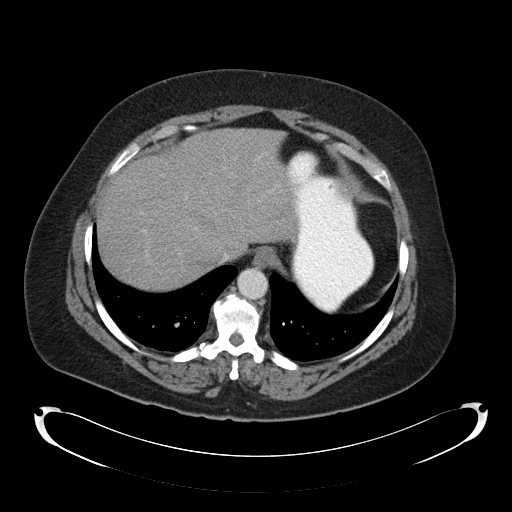
[im 92/99  soft-tissue]
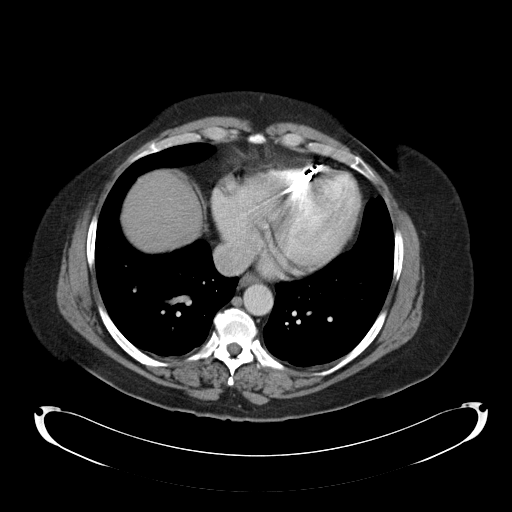

[Series 5: cor routine abd pel with · coronal · 0.79mm/px · 3 of 157 slices shown]
[im 53/157  soft-tissue]
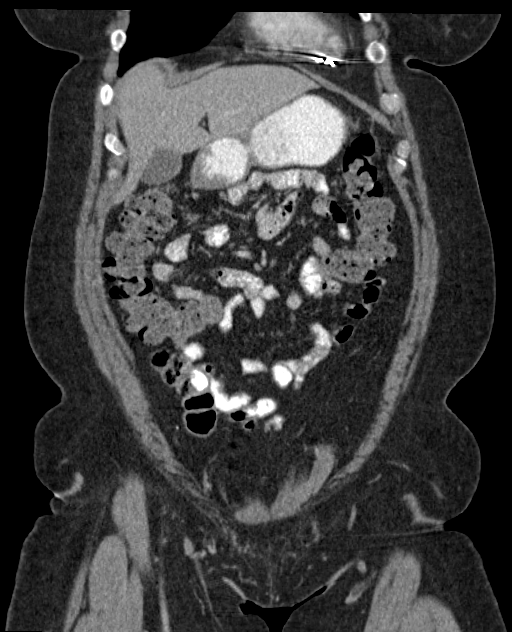
[im 70/157  soft-tissue]
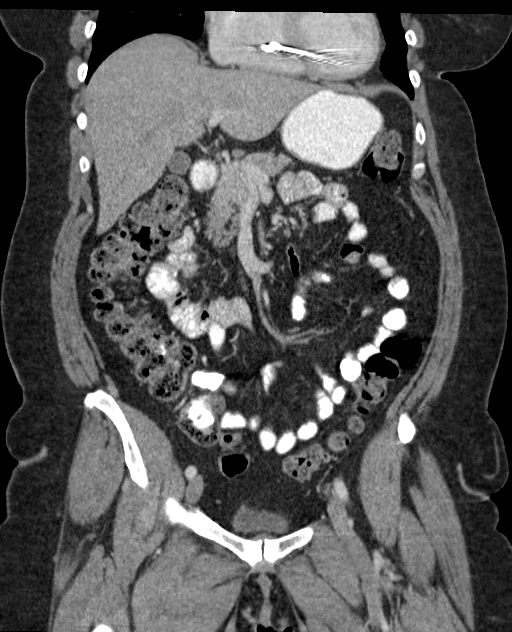
[im 87/157  soft-tissue]
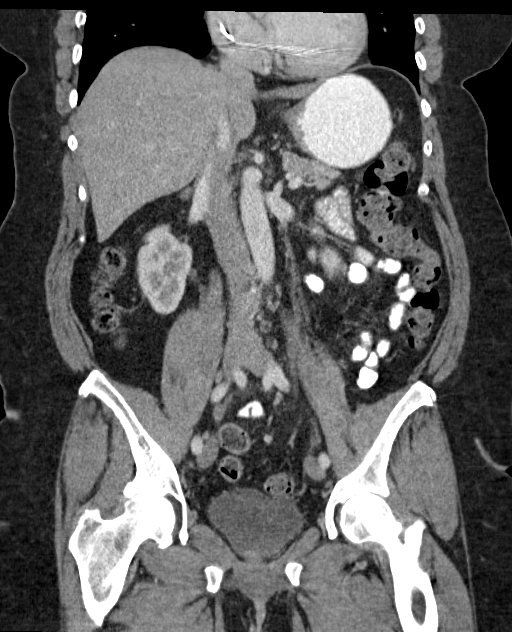

[16 of 46 positions shown; findings below may reference images not displayed]

FINDINGS: Lower chest: Lung bases are clear.

Hepatobiliary: No focal hepatic lesion. No biliary duct dilatation.
Gallbladder is normal. Common bile duct is normal.

Pancreas: Pancreas is normal. No ductal dilatation. No pancreatic
inflammation.

Spleen: Normal spleen

Adrenals/urinary tract: Adrenal glands and kidneys are normal. The
ureters and bladder normal.

Stomach/Bowel: The stomach, small bowel, cecum are normal. Appendix
not identified. No pericecal inflammation. Moderate volume of stool
throughout the colon.

Vascular/Lymphatic: Abdominal aorta is normal caliber. There is no
retroperitoneal or periportal lymphadenopathy. No pelvic
lymphadenopathy.

Reproductive: Post hysterectomy anatomy.

Other: No free fluid.

Musculoskeletal: No aggressive osseous lesion.
IMPRESSION: 1. No explanation for RIGHT lower quadrant pain.
2. Appendix not identified but there are no secondary signs of
appendicitis.
3. Moderate volume stool colon.
4. Hysterectomy.
5. No obstructive uropathy.

## 2015-11-16 ENCOUNTER — Other Ambulatory Visit: Payer: Self-pay | Admitting: Cardiovascular Disease

## 2016-02-07 ENCOUNTER — Encounter: Payer: Self-pay | Admitting: *Deleted

## 2016-02-07 ENCOUNTER — Ambulatory Visit: Payer: Medicare HMO | Admitting: Cardiovascular Disease

## 2016-02-15 ENCOUNTER — Encounter: Payer: Medicare HMO | Admitting: Obstetrics and Gynecology

## 2016-02-17 ENCOUNTER — Ambulatory Visit: Payer: Medicare HMO | Admitting: Cardiovascular Disease

## 2016-03-08 ENCOUNTER — Ambulatory Visit (INDEPENDENT_AMBULATORY_CARE_PROVIDER_SITE_OTHER): Payer: Medicare HMO | Admitting: Cardiovascular Disease

## 2016-03-08 ENCOUNTER — Encounter: Payer: Self-pay | Admitting: Cardiovascular Disease

## 2016-03-08 VITALS — BP 112/82 | HR 71 | Ht 66.0 in | Wt 268.8 lb

## 2016-03-08 DIAGNOSIS — I5022 Chronic systolic (congestive) heart failure: Secondary | ICD-10-CM

## 2016-03-08 DIAGNOSIS — I10 Essential (primary) hypertension: Secondary | ICD-10-CM

## 2016-03-08 NOTE — Patient Instructions (Signed)
Medication Instructions: No change  Labwork: None.   Procedures/Testing: None.   Follow-Up: 6 months with Dr. Fletcher Anon.   Any Additional Special Instructions Will Be Listed Below (If Applicable).  Talk with Dr. Rosario Jacks about resuming thyroid medication.    If you need a refill on your cardiac medications before your next appointment, please call your pharmacy.

## 2016-03-08 NOTE — Progress Notes (Signed)
Cardiology Office Note   Date:  03/08/2016   ID:  Lisa Fuentes, DOB 04-25-1960, MRN [id number]  PCP:  Casilda Carls  Cardiologist:   [name], MD   Chief Complaint  Patient presents with  . other    6 month follow up. patient c/o back pain and she does not know how to go to get it examined, Swelling in ankles and retaining fluid. Meds reviewed verbally wiht patient.       History of Present Illness: Lisa Fuentes is a 56 y.o. female who presents for a follow-up visit regarding chronic systolic heart failure due to nonischemic cardiomyopathy. She has known history of chronic systolic heart failure diagnosed in early 2000. She had cardiac catheterization done in 2003 which showed no significant coronary artery disease. She had an ICD placement in 2008 with generator replacement in May 2015 done by Mylinda Latina. She has known history of sleep apnea, hypertension and hyperthyroidism. She received radioactive iodine treatment and currently is on thyroid replacement therapy.  Most recent echocardiogram in January 2016 showed an ejection fraction of 40-45%. She was taken off digoxin in 2016. Most recent nuclear stress test in January 2017  showed no evidence of ischemia with ejection fraction of 49%.  Lisinopril has been on hold due to symptomatic hypotension. She has been doing reasonably well from a cardiac standpoint with no chest pain or shortness of breath. Unfortunately, she stopped taking levothyroxine at her on because she felt that it was making her gain weight. She developed leg edema and she actually gained more weight after she stopped the medication.   Past Medical History:  Diagnosis Date  . Anemia   . Anxiety    panic attacks  . Arthritis   . CHF (congestive heart failure) ([place])   . Clotting disorder ([place])   . Coronary artery disease   . Depression   . Difficult intubation   . Dysrhythmia   . Endometriosis of vagina 09/2013   Intra-operative findings of  endometriosis implants on cervical stump  . Headache   . Hypercholesteremia   . Hypertension   . Hypokalemia   . Hypothyroidism   . Implantable defibrillator    Medtronic DOI 2008, replacement 2015  . Menopausal symptoms   . Nonischemic cardiomyopathy (HCC)    Interval improvement EF 40-45% 1/16  . Shortness of breath dyspnea    chronic doe  . Sleep apnea    Treated with CPAP  . Tachycardia   . Thyroid disease     Past Surgical History:  Procedure Laterality Date  . BREAST CYST ASPIRATION Left 03/22/14   neg/ done by Dr Jamal Collin  . CARDIAC CATHETERIZATION  2003   [facility]: No significant coronary artery disease with reduced ejection fraction.  Marland Kitchen CARDIAC DEFIBRILLATOR PLACEMENT    . COLONOSCOPY  2015  . COLONOSCOPY WITH PROPOFOL N/A 10/04/2015   Procedure: COLONOSCOPY WITH PROPOFOL;  Surgeon: Lucilla Lame, MD;  Location: [facility] ENDOSCOPY;  Service: Endoscopy;  Laterality: N/A;  . CORONARY ANGIOPLASTY    . CYSTOSCOPY  11/15/2014   Procedure: CYSTOSCOPY;  Surgeon: Rubie Maid, MD;  Location: [facility] ORS;  Service: Gynecology;;  . FOOT SURGERY    . HAND SURGERY    . KNEE ARTHROSCOPY Left 03/02/2015   Procedure: ARTHROSCOPY  LEFT KNEE, PARTIAL LATERAL  MENISECTOMY, SYNOVECTOMY, MEDIAL & LATERAL CHONDROPLASTY;  Surgeon: Thornton Park, MD;  Location: [facility] ORS;  Service: Orthopedics;  Laterality: Left;  Dr. Mack Guise wanted her surgery switched from [facility]  because of the Defibrillator.  patient has a Defibrillator  takes Asprin/knows to stop 5days before  has had some test done  . LAPAROSCOPIC SALPINGO OOPHERECTOMY Left 11/15/2014   Procedure: LAPAROSCOPIC OOPHORECTOMY;  Surgeon: Rubie Maid, MD;  Location: [facility] ORS;  Service: Gynecology;  Laterality: Left;  . LAPAROSCOPIC SALPINGOOPHERECTOMY Right 09/2013   also with left salpingectomy  . TONSILLECTOMY    . TOTAL ABDOMINAL HYSTERECTOMY    . TRACHELECTOMY N/A 11/15/2014   Procedure: TRACHELECTOMY;  Surgeon: Rubie Maid, MD;   Location: [facility] ORS;  Service: Gynecology;  Laterality: N/A;  . TUBAL LIGATION Bilateral      Current Outpatient Prescriptions  Medication Sig Dispense Refill  . ALPRAZolam (XANAX) 0.25 MG tablet Take 0.25 mg by mouth daily as needed for anxiety. As needed     . aspirin EC 325 MG tablet Take 1 tablet (325 mg total) by mouth daily. (Patient taking differently: Take 325 mg by mouth every morning. ) 30 tablet 0  . carvedilol (COREG) 25 MG tablet TAKE ONE-HALF TABLET BY MOUTH TWICE DAILY 180 tablet 3  . citalopram (CELEXA) 40 MG tablet Take 40 mg by mouth every morning. TAKE ONE TABLET BY MOUTH ONCE DAILY    . docusate sodium (COLACE) 100 MG capsule Take 1 capsule (100 mg total) by mouth 2 (two) times daily as needed for mild constipation. 30 capsule 1  . esomeprazole (NEXIUM) 40 MG capsule Take 40 mg by mouth every morning.     . furosemide (LASIX) 40 MG tablet Take 40 mg by mouth daily. TAKE ONE TABLET BY MOUTH ONCE DAILY *PATIENT OCCASIONALLY TAKES AN EXTRA TABLET*    . loratadine (CLARITIN) 10 MG tablet Take 10 mg by mouth every morning.     . nystatin (MYCOSTATIN) powder Apply topically 4 (four) times daily as needed. Reported on 08/18/2015    . oxyCODONE (ROXICODONE) 5 MG immediate release tablet Take 1 tablet (5 mg total) by mouth every 6 (six) hours as needed for moderate pain. Do not drive while taking this medication. 10 tablet 0  . spironolactone (ALDACTONE) 25 MG tablet TAKE ONE-HALF TABLET BY MOUTH ONCE DAILY 45 tablet 3   No current facility-administered medications for this visit.     Allergies:   Sulfamethoxazole-trimethoprim    Social History:  The patient  reports that she has never smoked. She has never used smokeless tobacco. She reports that she drinks alcohol. She reports that she does not use drugs.   Family History:  The patient's family history includes Colon cancer in her mother; Hyperlipidemia in her mother; Hypertension in her mother; Stroke in her mother.    ROS:   Please see the history of present illness.   Otherwise, review of systems are positive for none.   All other systems are reviewed and negative.    PHYSICAL EXAM: VS:  BP 112/82 (BP Location: Left Arm, Patient Position: Sitting, Cuff Size: Normal)   Pulse 71   Ht 5\' 6"  (1.676 m)   Wt 268 lb 12 oz (121.9 kg)   BMI 43.38 kg/m  , BMI Body mass index is 43.38 kg/m. GEN: Well nourished, well developed, in no acute distress  HEENT: normal  Neck: no JVD, carotid bruits, or masses Cardiac: RRR; no murmurs, rubs, or gallops,no edema  Respiratory:  clear to auscultation bilaterally, normal work of breathing GI: soft, nontender, nondistended, + BS MS: no deformity or atrophy  Skin: warm and dry, no rash Neuro:  Strength and sensation are intact Psych: euthymic mood,  full affect   EKG:  EKG is ordered today. EKG showed normal sinus rhythm with nonspecific changes.    Recent Labs: No results found for requested labs within last 8760 hours.    Lipid Panel No results found for: CHOL, TRIG, HDL, CHOLHDL, VLDL, LDLCALC, LDLDIRECT    Wt Readings from Last 3 Encounters:  03/08/16 268 lb 12 oz (121.9 kg)  10/09/15 250 lb (113.4 kg)  10/04/15 250 lb (113.4 kg)       ASSESSMENT AND PLAN:  1.   Chronic systolic heart failure:She is doing well at the present time and continues to be in [place] class II. Continue treatment with carvedilol and spironolactone.  Lisinopril continues to be on hold given low blood pressure  2. Status post ICD placement: Followed by Dr. Caryl Comes  3. Hypothyroidism: She stopped taking levothyroxin on her own and gained weight since then. I strongly advised her to discuss with Dr. Rosario Jacks about resuming the medication. I explained to her the dangers of untreated hypothyroidism.  4. Obesity: She is very frustrated with this and I discussed with her different options for weight loss including diet programs and increasing exercise.  Disposition:   FU  with me in 6 months  Signed,  [name], MD  03/08/2016 10:06 AM    [place]

## 2016-03-22 DIAGNOSIS — M545 Low back pain, unspecified: Secondary | ICD-10-CM | POA: Insufficient documentation

## 2016-03-23 ENCOUNTER — Encounter: Payer: Self-pay | Admitting: Cardiology

## 2016-05-01 ENCOUNTER — Encounter: Payer: Medicare HMO | Admitting: Internal Medicine

## 2016-05-15 ENCOUNTER — Ambulatory Visit (INDEPENDENT_AMBULATORY_CARE_PROVIDER_SITE_OTHER): Payer: Self-pay | Admitting: Internal Medicine

## 2016-05-15 ENCOUNTER — Encounter: Payer: Self-pay | Admitting: Internal Medicine

## 2016-05-15 VITALS — BP 110/82 | HR 65 | Ht 67.0 in | Wt 262.2 lb

## 2016-05-15 DIAGNOSIS — I428 Other cardiomyopathies: Secondary | ICD-10-CM

## 2016-05-15 DIAGNOSIS — I10 Essential (primary) hypertension: Secondary | ICD-10-CM

## 2016-05-15 DIAGNOSIS — Z95810 Presence of automatic (implantable) cardiac defibrillator: Secondary | ICD-10-CM

## 2016-05-15 DIAGNOSIS — I5022 Chronic systolic (congestive) heart failure: Secondary | ICD-10-CM

## 2016-05-15 NOTE — Progress Notes (Signed)
Pt left as I was late

## 2016-05-22 ENCOUNTER — Encounter: Payer: Self-pay | Admitting: Internal Medicine

## 2016-05-22 ENCOUNTER — Ambulatory Visit (INDEPENDENT_AMBULATORY_CARE_PROVIDER_SITE_OTHER): Payer: Medicare HMO | Admitting: Internal Medicine

## 2016-05-22 VITALS — BP 116/84 | HR 76 | Ht 67.0 in | Wt 260.5 lb

## 2016-05-22 DIAGNOSIS — Z95810 Presence of automatic (implantable) cardiac defibrillator: Secondary | ICD-10-CM

## 2016-05-22 DIAGNOSIS — I428 Other cardiomyopathies: Secondary | ICD-10-CM | POA: Diagnosis not present

## 2016-05-22 DIAGNOSIS — I5022 Chronic systolic (congestive) heart failure: Secondary | ICD-10-CM | POA: Diagnosis not present

## 2016-05-22 LAB — CUP PACEART INCLINIC DEVICE CHECK
Battery Remaining Longevity: 114 mo
Battery Voltage: 3.01 V
Brady Statistic RV Percent Paced: 0.01 %
HighPow Impedance: 50 Ohm
HighPow Impedance: 61 Ohm
Implantable Lead Implant Date: 20150508
Implantable Lead Location: 753860
Implantable Pulse Generator Implant Date: 20150508
Lead Channel Impedance Value: 551 Ohm
Lead Channel Pacing Threshold Pulse Width: 0.4 ms
Lead Channel Sensing Intrinsic Amplitude: 12.75 mV
Lead Channel Setting Pacing Amplitude: 2 V
Lead Channel Setting Pacing Pulse Width: 0.4 ms
MDC IDC LEAD SERIAL: 194163
MDC IDC MSMT LEADCHNL RV IMPEDANCE VALUE: 532 Ohm
MDC IDC MSMT LEADCHNL RV PACING THRESHOLD AMPLITUDE: 0.625 V
MDC IDC SESS DTM: 20180424134648
MDC IDC SET LEADCHNL RV SENSING SENSITIVITY: 0.3 mV

## 2016-05-22 MED ORDER — ASPIRIN EC 81 MG PO TBEC: 81.0000 mg | DELAYED_RELEASE_TABLET | Freq: Every day | ORAL | Status: DC

## 2016-05-22 NOTE — Patient Instructions (Signed)
Medication Instructions: - Your physician has recommended you make the following change in your medication:  1) Decrease aspirin to 81 mg- take one tablet by mouth once daily  Labwork: - none ordered  Procedures/Testing: - none ordered  Follow-Up: - Remote monitoring is used to monitor your Pacemaker of ICD from home. This monitoring reduces the number of office visits required to check your device to one time per year. It allows [place] to keep an eye on the functioning of your device to ensure it is working properly. You are scheduled for a device check from home on 08/21/16. You may send your transmission at any time that day. If you have a wireless device, the transmission will be sent automatically. After your physician reviews your transmission, you will receive a postcard with your next transmission date.  - Your physician wants you to follow-up in: 1 year with Dr. Caryl Comes. You will receive a reminder letter in the mail two months in advance. If you don't receive a letter, please call our office to schedule the follow-up appointment.   Any Additional Special Instructions Will Be Listed Below (If Applicable).     If you need a refill on your cardiac medications before your next appointment, please call your pharmacy.

## 2016-05-22 NOTE — Progress Notes (Signed)
Patient Care Team: Casilda Carls as PCP - General (Internal Medicine) Casilda Carls (Internal Medicine) Seeplaputhur Robinette Haines, MD (General Surgery) Wellington Hampshire, MD as Consulting Physician (Cardiology)   HPI  Lisa Fuentes is a 56 y.o. female Seen in follow-up for ICD implanted for primary prevention for nonischemic cardiomyopathy and congestive heart failure. Done elsewhere.  She has been without sob and chest pain.  Most recent echocardiogram in January 2016 showed an ejection fraction of 40-45%. She was taken off digoxin last year. Most recent nuclear stress test in January 2017 showed no evidence of ischemia with ejection fraction of 49%.  Records and Results Reviewed notes from primary cardiology  Past Medical History:  Diagnosis Date  . Anemia   . Anxiety    panic attacks  . Arthritis   . CHF (congestive heart failure) ([place])   . Clotting disorder ([place])   . Coronary artery disease   . Depression   . Difficult intubation   . Dysrhythmia   . Endometriosis of vagina 09/2013   Intra-operative findings of endometriosis implants on cervical stump  . Headache   . Hypercholesteremia   . Hypertension   . Hypokalemia   . Hypothyroidism   . Implantable defibrillator    Medtronic DOI 2008, replacement 2015  . Menopausal symptoms   . Nonischemic cardiomyopathy (HCC)    Interval improvement EF 40-45% 1/16  . Shortness of breath dyspnea    chronic doe  . Sleep apnea    Treated with CPAP  . Tachycardia   . Thyroid disease     Past Surgical History:  Procedure Laterality Date  . BREAST CYST ASPIRATION Left 03/22/14   neg/ done by Dr Jamal Collin  . CARDIAC CATHETERIZATION  2003   [facility]: No significant coronary artery disease with reduced ejection fraction.  Marland Kitchen CARDIAC DEFIBRILLATOR PLACEMENT    . COLONOSCOPY  2015  . COLONOSCOPY WITH PROPOFOL N/A 10/04/2015   Procedure: COLONOSCOPY WITH PROPOFOL;  Surgeon: Lucilla Lame, MD;  Location: [facility] ENDOSCOPY;  Service:  Endoscopy;  Laterality: N/A;  . CORONARY ANGIOPLASTY    . CYSTOSCOPY  11/15/2014   Procedure: CYSTOSCOPY;  Surgeon: Rubie Maid, MD;  Location: [facility] ORS;  Service: Gynecology;;  . FOOT SURGERY    . HAND SURGERY    . KNEE ARTHROSCOPY Left 03/02/2015   Procedure: ARTHROSCOPY  LEFT KNEE, PARTIAL LATERAL  MENISECTOMY, SYNOVECTOMY, MEDIAL & LATERAL CHONDROPLASTY;  Surgeon: Thornton Park, MD;  Location: [facility] ORS;  Service: Orthopedics;  Laterality: Left;  Dr. Mack Guise wanted her surgery switched from [facility] because of the Defibrillator.  patient has a Defibrillator  takes Asprin/knows to stop 5days before  has had some test done  . LAPAROSCOPIC SALPINGO OOPHERECTOMY Left 11/15/2014   Procedure: LAPAROSCOPIC OOPHORECTOMY;  Surgeon: Rubie Maid, MD;  Location: [facility] ORS;  Service: Gynecology;  Laterality: Left;  . LAPAROSCOPIC SALPINGOOPHERECTOMY Right 09/2013   also with left salpingectomy  . TONSILLECTOMY    . TOTAL ABDOMINAL HYSTERECTOMY    . TRACHELECTOMY N/A 11/15/2014   Procedure: TRACHELECTOMY;  Surgeon: Rubie Maid, MD;  Location: [facility] ORS;  Service: Gynecology;  Laterality: N/A;  . TUBAL LIGATION Bilateral     Current Outpatient Prescriptions  Medication Sig Dispense Refill  . ALPRAZolam (XANAX) 0.25 MG tablet Take 0.25 mg by mouth daily as needed for anxiety. As needed     . carvedilol (COREG) 25 MG tablet TAKE ONE-HALF TABLET BY MOUTH TWICE DAILY 180 tablet 3  . citalopram (CELEXA) 40 MG  tablet Take 40 mg by mouth every morning. TAKE ONE TABLET BY MOUTH ONCE DAILY    . cyclobenzaprine (FLEXERIL) 10 MG tablet Take 10 mg by mouth 3 (three) times daily as needed for muscle spasms.    Marland Kitchen esomeprazole (NEXIUM) 40 MG capsule Take 40 mg by mouth every morning.     . furosemide (LASIX) 40 MG tablet Take 40 mg by mouth daily. TAKE ONE TABLET BY MOUTH ONCE DAILY *PATIENT OCCASIONALLY TAKES AN EXTRA TABLET*    . ibuprofen (ADVIL,MOTRIN) 800 MG tablet Take 800 mg by mouth every  8 (eight) hours as needed.    Marland Kitchen levothyroxine (SYNTHROID, LEVOTHROID) 137 MCG tablet Take 137 mcg by mouth daily before breakfast.    . loratadine (CLARITIN) 10 MG tablet Take 10 mg by mouth every morning.     . nystatin (MYCOSTATIN) powder Apply topically 4 (four) times daily as needed. Reported on 08/18/2015    . spironolactone (ALDACTONE) 25 MG tablet TAKE ONE-HALF TABLET BY MOUTH ONCE DAILY 45 tablet 3  . tiZANidine (ZANAFLEX) 2 MG tablet Take 2 mg by mouth every 6 (six) hours as needed for muscle spasms.    Marland Kitchen aspirin EC 81 MG tablet Take 1 tablet (81 mg total) by mouth daily.     No current facility-administered medications for this visit.     Allergies  Allergen Reactions  . Sulfamethoxazole-Trimethoprim Hives      Review of Systems negative except from HPI and PMH  Physical Exam BP 116/84 (BP Location: Left Arm, Patient Position: Sitting, Cuff Size: Large)   Pulse 76   Ht 5\' 7"  (1.702 m)   Wt 260 lb 8 oz (118.2 kg)   BMI 40.80 kg/m  Well developed and well nourished in no acute distress HENT normal E scleral and icterus clear Neck Supple JVP flat; carotids brisk and full Clear to ausculation  Regular rate and rhythm, no murmurs gallops or rub Soft with active bowel sounds RUQ tenderness No clubbing cyanosis  Edema Alert and oriented, grossly normal motor and sensory function Skin Warm and Dry  ECG personally reviewed sinus rhtyhm  Assessment and  Plan  Nonischemic cardiomyopathy with interval improvement EF most recently 40-45%  Implantable defibrillator-Medtronic  Side pain   Hypertension  Morbidly obese  Treated sleep apnea  BP well controlled  Continue to work on weight loss  Decrease ASA 81

## 2016-08-10 ENCOUNTER — Other Ambulatory Visit: Payer: Self-pay | Admitting: Internal Medicine

## 2016-08-10 DIAGNOSIS — Z1231 Encounter for screening mammogram for malignant neoplasm of breast: Secondary | ICD-10-CM

## 2016-08-21 ENCOUNTER — Telehealth: Payer: Self-pay | Admitting: Cardiology

## 2016-08-21 ENCOUNTER — Encounter: Payer: Medicare HMO | Admitting: *Deleted

## 2016-08-21 NOTE — Telephone Encounter (Signed)
LMOVM reminding pt to send remote transmission.   

## 2016-08-24 ENCOUNTER — Encounter: Payer: Self-pay | Admitting: Cardiology

## 2016-08-28 ENCOUNTER — Ambulatory Visit (INDEPENDENT_AMBULATORY_CARE_PROVIDER_SITE_OTHER): Payer: Medicare HMO | Admitting: *Deleted

## 2016-08-28 DIAGNOSIS — I428 Other cardiomyopathies: Secondary | ICD-10-CM | POA: Diagnosis not present

## 2016-08-29 ENCOUNTER — Ambulatory Visit: Payer: Medicaid Other | Admitting: Podiatry

## 2016-08-29 NOTE — Progress Notes (Signed)
Remote ICD transmission.   

## 2016-08-31 ENCOUNTER — Encounter: Payer: Self-pay | Admitting: Cardiology

## 2016-08-31 LAB — CUP PACEART REMOTE DEVICE CHECK
Battery Remaining Longevity: 111 mo
Battery Voltage: 3.01 V
Brady Statistic RV Percent Paced: 0.01 %
Date Time Interrogation Session: 20180801014654
HIGH POWER IMPEDANCE MEASURED VALUE: 53 Ohm
HIGH POWER IMPEDANCE MEASURED VALUE: 65 Ohm
Implantable Lead Model: 185
Lead Channel Impedance Value: 532 Ohm
Lead Channel Impedance Value: 532 Ohm
Lead Channel Pacing Threshold Pulse Width: 0.4 ms
Lead Channel Sensing Intrinsic Amplitude: 8.625 mV
Lead Channel Setting Pacing Amplitude: 2 V
Lead Channel Setting Pacing Pulse Width: 0.4 ms
MDC IDC LEAD IMPLANT DT: 20150508
MDC IDC LEAD LOCATION: 753860
MDC IDC LEAD SERIAL: 194163
MDC IDC MSMT LEADCHNL RV PACING THRESHOLD AMPLITUDE: 0.875 V
MDC IDC MSMT LEADCHNL RV SENSING INTR AMPL: 8.625 mV
MDC IDC PG IMPLANT DT: 20150508
MDC IDC SET LEADCHNL RV SENSING SENSITIVITY: 0.3 mV

## 2016-09-04 ENCOUNTER — Ambulatory Visit
Admission: RE | Admit: 2016-09-04 | Discharge: 2016-09-04 | Disposition: A | Discharge status: Home / Self Care | Payer: Medicare HMO | Source: Ambulatory Visit | Attending: Internal Medicine | Admitting: Internal Medicine

## 2016-09-04 DIAGNOSIS — Z1231 Encounter for screening mammogram for malignant neoplasm of breast: Secondary | ICD-10-CM | POA: Insufficient documentation

## 2016-09-10 ENCOUNTER — Ambulatory Visit (INDEPENDENT_AMBULATORY_CARE_PROVIDER_SITE_OTHER): Payer: Medicare HMO

## 2016-09-10 ENCOUNTER — Ambulatory Visit (INDEPENDENT_AMBULATORY_CARE_PROVIDER_SITE_OTHER): Payer: Medicare HMO | Admitting: Podiatry

## 2016-09-10 ENCOUNTER — Encounter: Payer: Self-pay | Admitting: Podiatry

## 2016-09-10 VITALS — BP 110/68 | HR 70 | Resp 16

## 2016-09-10 DIAGNOSIS — R0789 Other chest pain: Secondary | ICD-10-CM

## 2016-09-10 DIAGNOSIS — M722 Plantar fascial fibromatosis: Secondary | ICD-10-CM | POA: Diagnosis not present

## 2016-09-10 DIAGNOSIS — M779 Enthesopathy, unspecified: Secondary | ICD-10-CM

## 2016-09-10 DIAGNOSIS — R079 Chest pain, unspecified: Secondary | ICD-10-CM | POA: Insufficient documentation

## 2016-09-10 DIAGNOSIS — E782 Mixed hyperlipidemia: Secondary | ICD-10-CM | POA: Insufficient documentation

## 2016-09-10 MED ORDER — METHYLPREDNISOLONE 4 MG PO TBPK: ORAL_TABLET | ORAL | 0 refills | Status: DC

## 2016-09-10 MED ORDER — MELOXICAM 15 MG PO TABS: 15.0000 mg | ORAL_TABLET | Freq: Every day | ORAL | 3 refills | Status: DC

## 2016-09-10 NOTE — Patient Instructions (Signed)

## 2016-09-10 NOTE — Progress Notes (Signed)
   Subjective:    Patient ID: Lisa Fuentes, female    DOB: [birth year], 56 y.o.   MRN: [id number]  HPI: She presents today with chief complaint of bilateral heel pain. States her details of involving her the past 2 months left seems to be worse than the right morning and sometimes all day the pain lasts. She takes naproxen or ibuprofen for her back and it seems also help with her feet. She has a history of plantar fasciitis she states that the forefoot curves out of bed on her left foot.    Review of Systems  HENT: Positive for sinus pressure.   Eyes: Positive for visual disturbance.  Gastrointestinal: Positive for abdominal distention.  Musculoskeletal: Positive for arthralgias and back pain.  All other systems reviewed and are negative.      Objective:   Physical Exam: Vital signs are stable alert and oriented 3. Pulses are palpable. Neurologic sensorium is intact. Deep tendon reflexes are intact. Muscle strength is 5 over 5 dorsiflexion and plantar flexors and inverters everters all intrinsic musculature is intact. Orthopedic evaluation was resolved most of the ankle for range of motion without crepitation. She has pes planus left foot greater than that of the right. She has pain on palpation medially and tubercle of the left heel greater than that of the right. Radiographs taken today 3 views bilateral foot demonstrates soft tissue increase in density of plantar fascial insertion sites bilateral with old distally oriented calcaneal heel spurs. There is some calcification noted on the left 1. No open lesions or wounds are visualized on physical exam.        Assessment & Plan:  Pes planus left greater than right plantar fasciitis left greater than right.  Plan: Start her on a Medrol Dosepak to be followed by meloxicam. Injected the bilateral heels today placed her plantar fascia braces bilaterally and a single night splint. Discussed appropriate shoe gear stretching exercises ice  therapy and sugar modifications.

## 2016-10-15 ENCOUNTER — Encounter (INDEPENDENT_AMBULATORY_CARE_PROVIDER_SITE_OTHER): Payer: Medicare HMO | Admitting: Podiatry

## 2016-10-15 NOTE — Progress Notes (Signed)
This encounter was created in error - please disregard.

## 2016-10-31 ENCOUNTER — Ambulatory Visit (INDEPENDENT_AMBULATORY_CARE_PROVIDER_SITE_OTHER): Payer: Medicare HMO | Admitting: Podiatry

## 2016-10-31 ENCOUNTER — Encounter: Payer: Self-pay | Admitting: Podiatry

## 2016-10-31 DIAGNOSIS — M722 Plantar fascial fibromatosis: Secondary | ICD-10-CM

## 2016-10-31 NOTE — Progress Notes (Signed)
She presents today for follow-up of her plantar fasciitis right over left. She states that the right foot is doing much better and hardly has any pain at all the left foot has started become painful.  Objective: Vital signs are stable alert and oriented 3. Pulses are palpable. She has pain on palpation medial continued to remove the left heel none on the right.  Assessment: Plantar fasciitis resolved 100% on the right painful on the left.  Plan: Started injection therapy to the left heel. She will continue all other conservative therapies including nonsteroidal anti-inflammatories and plantar fascial brace and night splint. All with her in 1 month

## 2016-11-27 ENCOUNTER — Ambulatory Visit (INDEPENDENT_AMBULATORY_CARE_PROVIDER_SITE_OTHER): Payer: Medicare HMO | Admitting: *Deleted

## 2016-11-27 DIAGNOSIS — I428 Other cardiomyopathies: Secondary | ICD-10-CM

## 2016-11-27 NOTE — Progress Notes (Signed)
Remote ICD transmission.   

## 2016-11-28 ENCOUNTER — Ambulatory Visit: Payer: Medicare HMO | Admitting: Podiatry

## 2016-11-29 LAB — CUP PACEART REMOTE DEVICE CHECK
Battery Remaining Longevity: 107 mo
HIGH POWER IMPEDANCE MEASURED VALUE: 54 Ohm
HighPow Impedance: 66 Ohm
Implantable Lead Implant Date: 20150508
Implantable Pulse Generator Implant Date: 20150508
Lead Channel Impedance Value: 589 Ohm
Lead Channel Pacing Threshold Amplitude: 0.75 V
Lead Channel Pacing Threshold Pulse Width: 0.4 ms
Lead Channel Setting Pacing Amplitude: 2 V
Lead Channel Setting Pacing Pulse Width: 0.4 ms
Lead Channel Setting Sensing Sensitivity: 0.3 mV
MDC IDC LEAD LOCATION: 753860
MDC IDC LEAD SERIAL: 194163
MDC IDC MSMT BATTERY VOLTAGE: 3.01 V
MDC IDC MSMT LEADCHNL RV IMPEDANCE VALUE: 589 Ohm
MDC IDC MSMT LEADCHNL RV SENSING INTR AMPL: 9.125 mV
MDC IDC MSMT LEADCHNL RV SENSING INTR AMPL: 9.125 mV
MDC IDC SESS DTM: 20181030062704
MDC IDC STAT BRADY RV PERCENT PACED: 0 %

## 2016-12-05 ENCOUNTER — Encounter: Payer: Self-pay | Admitting: Cardiology

## 2016-12-09 ENCOUNTER — Other Ambulatory Visit: Payer: Self-pay | Admitting: Cardiovascular Disease

## 2016-12-25 ENCOUNTER — Ambulatory Visit
Admission: RE | Admit: 2016-12-25 | Discharge: 2016-12-25 | Disposition: A | Discharge status: Home / Self Care | Payer: Medicare HMO | Source: Ambulatory Visit | Attending: Internal Medicine | Admitting: Internal Medicine

## 2016-12-25 ENCOUNTER — Other Ambulatory Visit: Payer: Self-pay | Admitting: Internal Medicine

## 2016-12-25 DIAGNOSIS — M79661 Pain in right lower leg: Secondary | ICD-10-CM | POA: Insufficient documentation

## 2017-01-14 ENCOUNTER — Other Ambulatory Visit: Payer: Self-pay | Admitting: Cardiovascular Disease

## 2017-02-15 ENCOUNTER — Ambulatory Visit: Payer: Medicare HMO | Admitting: Cardiovascular Disease

## 2017-02-15 ENCOUNTER — Encounter: Payer: Self-pay | Admitting: Cardiovascular Disease

## 2017-02-15 VITALS — BP 100/64 | HR 82 | Ht 67.0 in | Wt 269.0 lb

## 2017-02-15 DIAGNOSIS — I5022 Chronic systolic (congestive) heart failure: Secondary | ICD-10-CM | POA: Diagnosis not present

## 2017-02-15 DIAGNOSIS — Z95810 Presence of automatic (implantable) cardiac defibrillator: Secondary | ICD-10-CM | POA: Diagnosis not present

## 2017-02-15 MED ORDER — CARVEDILOL 25 MG PO TABS: 12.5000 mg | ORAL_TABLET | Freq: Two times a day (BID) | ORAL | 2 refills | Status: DC

## 2017-02-15 MED ORDER — SPIRONOLACTONE 25 MG PO TABS: 12.5000 mg | ORAL_TABLET | Freq: Every day | ORAL | 2 refills | Status: DC

## 2017-02-15 NOTE — Progress Notes (Signed)
Cardiology Office Note   Date:  02/15/2017   ID:  Lisa Fuentes, DOB Mar 30, 1960, MRN [id number]  PCP:  Casilda Carls, MD  Cardiologist:   [name], MD   Chief Complaint  Patient presents with  . other    6 month f/u c/o edema and indigestion. Meds reviewed verbally with pt.      History of Present Illness: Lisa Fuentes is a 57 y.o. female who presents for a follow-up visit regarding chronic systolic heart failure due to nonischemic cardiomyopathy. She has known history of chronic systolic heart failure diagnosed in early 2000. She had cardiac catheterization done in 2003 which showed no significant coronary artery disease. She had an ICD placement in 2008 with generator replacement in May 2015 done by Mylinda Latina. She has known history of sleep apnea, hypertension and hyperthyroidism. She received radioactive iodine treatment and currently is on thyroid replacement therapy.  Most recent echocardiogram in January 2016 showed an ejection fraction of 40-45%. She was taken off digoxin in 2016. Most recent nuclear stress test in January 2017  showed no evidence of ischemia with ejection fraction of 49%.  The patient was placed on small dose lisinopril 5 mg in 2016 but developed symptomatic hypotension the medication was stopped. She has been doing reasonably well overall with no recent chest, shortness of breath or palpitations.  She continues to struggle with obesity.   Past Medical History:  Diagnosis Date  . Anemia   . Anxiety    panic attacks  . Arthritis   . CHF (congestive heart failure) ([place])   . Clotting disorder ([place])   . Coronary artery disease   . Depression   . Difficult intubation   . Dysrhythmia   . Endometriosis of vagina 09/2013   Intra-operative findings of endometriosis implants on cervical stump  . Headache   . Hypercholesteremia   . Hypertension   . Hypokalemia   . Hypothyroidism   . Implantable defibrillator    Medtronic DOI 2008,  replacement 2015  . Menopausal symptoms   . Nonischemic cardiomyopathy (HCC)    Interval improvement EF 40-45% 1/16  . Shortness of breath dyspnea    chronic doe  . Sleep apnea    Treated with CPAP  . Tachycardia   . Thyroid disease     Past Surgical History:  Procedure Laterality Date  . BREAST CYST ASPIRATION Left 03/22/14   neg/ done by Dr Jamal Collin  . CARDIAC CATHETERIZATION  2003   [facility]: No significant coronary artery disease with reduced ejection fraction.  Marland Kitchen CARDIAC DEFIBRILLATOR PLACEMENT    . COLONOSCOPY  2015  . COLONOSCOPY WITH PROPOFOL N/A 10/04/2015   Procedure: COLONOSCOPY WITH PROPOFOL;  Surgeon: Lucilla Lame, MD;  Location: [facility] ENDOSCOPY;  Service: Endoscopy;  Laterality: N/A;  . CORONARY ANGIOPLASTY    . CYSTOSCOPY  11/15/2014   Procedure: CYSTOSCOPY;  Surgeon: Rubie Maid, MD;  Location: [facility] ORS;  Service: Gynecology;;  . FOOT SURGERY    . HAND SURGERY    . KNEE ARTHROSCOPY Left 03/02/2015   Procedure: ARTHROSCOPY  LEFT KNEE, PARTIAL LATERAL  MENISECTOMY, SYNOVECTOMY, MEDIAL & LATERAL CHONDROPLASTY;  Surgeon: Thornton Park, MD;  Location: [facility] ORS;  Service: Orthopedics;  Laterality: Left;  Dr. Mack Guise wanted her surgery switched from [facility] because of the Defibrillator.  patient has a Defibrillator  takes Asprin/knows to stop 5days before  has had some test done  . LAPAROSCOPIC SALPINGO OOPHERECTOMY Left 11/15/2014   Procedure: LAPAROSCOPIC OOPHORECTOMY;  Surgeon:  Rubie Maid, MD;  Location: [facility] ORS;  Service: Gynecology;  Laterality: Left;  . LAPAROSCOPIC SALPINGOOPHERECTOMY Right 09/2013   also with left salpingectomy  . TONSILLECTOMY    . TOTAL ABDOMINAL HYSTERECTOMY    . TRACHELECTOMY N/A 11/15/2014   Procedure: TRACHELECTOMY;  Surgeon: Rubie Maid, MD;  Location: [facility] ORS;  Service: Gynecology;  Laterality: N/A;  . TUBAL LIGATION Bilateral      Current Outpatient Medications  Medication Sig Dispense Refill  . ALPRAZolam (XANAX)  0.25 MG tablet Take 0.25 mg by mouth daily as needed for anxiety. As needed     . aspirin EC 81 MG tablet Take 1 tablet (81 mg total) by mouth daily.    . carvedilol (COREG) 25 MG tablet TAKE ONE-HALF TABLET BY MOUTH TWICE DAILY 180 tablet 3  . chlorzoxazone (PARAFON) 500 MG tablet Take 500 mg by mouth as needed.     . citalopram (CELEXA) 40 MG tablet Take 40 mg by mouth every morning. TAKE ONE TABLET BY MOUTH ONCE DAILY    . cyclobenzaprine (FLEXERIL) 10 MG tablet Take 10 mg by mouth 3 (three) times daily as needed for muscle spasms.    Marland Kitchen esomeprazole (NEXIUM) 40 MG capsule Take 40 mg by mouth every morning.     . furosemide (LASIX) 40 MG tablet Take 40 mg by mouth daily. TAKE ONE TABLET BY MOUTH ONCE DAILY *PATIENT OCCASIONALLY TAKES AN EXTRA TABLET*    . ibuprofen (ADVIL,MOTRIN) 800 MG tablet Take 800 mg by mouth every 8 (eight) hours as needed.    . Levothyroxine Sodium 150 MCG CAPS Take by mouth daily before breakfast.    . loratadine (CLARITIN) 10 MG tablet Take 10 mg by mouth every morning.     . meloxicam (MOBIC) 15 MG tablet Take 1 tablet (15 mg total) by mouth daily. 30 tablet 3  . nystatin (MYCOSTATIN) powder Apply topically 4 (four) times daily as needed. Reported on 08/18/2015    . spironolactone (ALDACTONE) 25 MG tablet Take 0.5 tablets (12.5 mg total) by mouth daily. 30 tablet 0  . tiZANidine (ZANAFLEX) 2 MG tablet Take 2 mg by mouth every 6 (six) hours as needed for muscle spasms.     No current facility-administered medications for this visit.     Allergies:   Sulfamethoxazole-trimethoprim    Social History:  The patient  reports that  has never smoked. she has never used smokeless tobacco. She reports that she drinks alcohol. She reports that she does not use drugs.   Family History:  The patient's family history includes Colon cancer in her mother; Hyperlipidemia in her mother; Hypertension in her mother; Stroke in her mother.    ROS:  Please see the history of present  illness.   Otherwise, review of systems are positive for none.   All other systems are reviewed and negative.    PHYSICAL EXAM: VS:  BP 100/64 (BP Location: Left Arm, Patient Position: Sitting, Cuff Size: Large)   Pulse 82   Ht 5\' 7"  (1.702 m)   Wt 269 lb (122 kg)   BMI 42.13 kg/m  , BMI Body mass index is 42.13 kg/m. GEN: Well nourished, well developed, in no acute distress  HEENT: normal  Neck: no JVD, carotid bruits, or masses Cardiac: RRR; no murmurs, rubs, or gallops,no edema  Respiratory:  clear to auscultation bilaterally, normal work of breathing GI: soft, nontender, nondistended, + BS MS: no deformity or atrophy  Skin: warm and dry, no rash Neuro:  Strength and sensation  are intact Psych: euthymic mood, full affect   EKG:  EKG is ordered today. EKG showed normal sinus rhythm with nonspecific ST and T wave changes    Recent Labs: No results found for requested labs within last 8760 hours.    Lipid Panel No results found for: CHOL, TRIG, HDL, CHOLHDL, VLDL, LDLCALC, LDLDIRECT    Wt Readings from Last 3 Encounters:  02/15/17 269 lb (122 kg)  05/22/16 260 lb 8 oz (118.2 kg)  05/15/16 262 lb 4 oz (119 kg)       ASSESSMENT AND PLAN:  1.   Chronic systolic heart failure:She is doing well at the present time and continues to be in Holt class II. Continue treatment with carvedilol and spironolactone.  Blood pressure continues to be on the low side and currently she is not on an ACE inhibitor or ARB.  I requested a follow-up echocardiogram.  If ejection fraction is below 50%, I would consider adding small dose losartan.  She gets frequent labs done by her primary care physician.  2. Status post ICD placement: Followed by Dr. Caryl Comes  3. Hypothyroidism: Managed by Dr. Dr. Rosario Jacks .  Disposition:   FU with me in 6 months  Signed,  [name], MD  02/15/2017 1:46 PM    [facility]

## 2017-02-15 NOTE — Patient Instructions (Addendum)
Medication Instructions:  Your physician recommends that you continue on your current medications as directed. Please refer to the Current Medication list given to you today.   Labwork: none  Testing/Procedures: Your physician has requested that you have an echocardiogram. Echocardiography is a painless test that uses sound waves to create images of your heart. It provides your doctor with information about the size and shape of your heart and how well your heart's chambers and valves are working. This procedure takes approximately one hour. There are no restrictions for this procedure.    Follow-Up: Your physician wants you to follow-up in: 6 months with Dr. Arida.  You will receive a reminder letter in the mail two months in advance. If you don't receive a letter, please call our office to schedule the follow-up appointment.   Any Other Special Instructions Will Be Listed Below (If Applicable).     If you need a refill on your cardiac medications before your next appointment, please call your pharmacy.  Echocardiogram An echocardiogram, or echocardiography, uses sound waves (ultrasound) to produce an image of your heart. The echocardiogram is simple, painless, obtained within a short period of time, and offers valuable information to your health care provider. The images from an echocardiogram can provide information such as:  Evidence of coronary artery disease (CAD).  Heart size.  Heart muscle function.  Heart valve function.  Aneurysm detection.  Evidence of a past heart attack.  Fluid buildup around the heart.  Heart muscle thickening.  Assess heart valve function.  Tell a health care provider about:  Any allergies you have.  All medicines you are taking, including vitamins, herbs, eye drops, creams, and over-the-counter medicines.  Any problems you or family members have had with anesthetic medicines.  Any blood disorders you have.  Any surgeries you have  had.  Any medical conditions you have.  Whether you are pregnant or may be pregnant. What happens before the procedure? No special preparation is needed. Eat and drink normally. What happens during the procedure?  In order to produce an image of your heart, gel will be applied to your chest and a wand-like tool (transducer) will be moved over your chest. The gel will help transmit the sound waves from the transducer. The sound waves will harmlessly bounce off your heart to allow the heart images to be captured in real-time motion. These images will then be recorded.  You may need an IV to receive a medicine that improves the quality of the pictures. What happens after the procedure? You may return to your normal schedule including diet, activities, and medicines, unless your health care provider tells you otherwise. This information is not intended to replace advice given to you by your health care provider. Make sure you discuss any questions you have with your health care provider. Document Released: 01/13/2000 Document Revised: 09/03/2015 Document Reviewed: 09/22/2012 Elsevier Interactive Patient Education  2017 [employer]  

## 2017-02-22 ENCOUNTER — Other Ambulatory Visit: Payer: Self-pay

## 2017-02-22 ENCOUNTER — Ambulatory Visit (INDEPENDENT_AMBULATORY_CARE_PROVIDER_SITE_OTHER): Payer: Medicare HMO

## 2017-02-22 DIAGNOSIS — I5022 Chronic systolic (congestive) heart failure: Secondary | ICD-10-CM | POA: Diagnosis not present

## 2017-02-26 ENCOUNTER — Ambulatory Visit (INDEPENDENT_AMBULATORY_CARE_PROVIDER_SITE_OTHER): Payer: Medicare HMO | Admitting: *Deleted

## 2017-02-26 ENCOUNTER — Telehealth: Payer: Self-pay | Admitting: Cardiology

## 2017-02-26 DIAGNOSIS — I428 Other cardiomyopathies: Secondary | ICD-10-CM

## 2017-02-26 NOTE — Telephone Encounter (Signed)
LMOVM reminding pt to send remote transmission.   

## 2017-02-27 NOTE — Progress Notes (Signed)
Remote ICD transmission.   

## 2017-02-28 ENCOUNTER — Encounter: Payer: Self-pay | Admitting: Cardiology

## 2017-03-13 ENCOUNTER — Ambulatory Visit: Payer: Medicare HMO | Admitting: Podiatry

## 2017-03-13 LAB — CUP PACEART REMOTE DEVICE CHECK
Battery Voltage: 3 V
Brady Statistic RV Percent Paced: 0.01 %
Date Time Interrogation Session: 20190130042829
HighPow Impedance: 48 Ohm
HighPow Impedance: 56 Ohm
Implantable Lead Implant Date: 20150508
Implantable Lead Location: 753860
Implantable Lead Serial Number: 194163
Lead Channel Impedance Value: 475 Ohm
Lead Channel Pacing Threshold Amplitude: 0.625 V
Lead Channel Sensing Intrinsic Amplitude: 9.25 mV
Lead Channel Setting Sensing Sensitivity: 0.3 mV
MDC IDC MSMT BATTERY REMAINING LONGEVITY: 104 mo
MDC IDC MSMT LEADCHNL RV IMPEDANCE VALUE: 475 Ohm
MDC IDC MSMT LEADCHNL RV PACING THRESHOLD PULSEWIDTH: 0.4 ms
MDC IDC MSMT LEADCHNL RV SENSING INTR AMPL: 9.25 mV
MDC IDC PG IMPLANT DT: 20150508
MDC IDC SET LEADCHNL RV PACING AMPLITUDE: 2 V
MDC IDC SET LEADCHNL RV PACING PULSEWIDTH: 0.4 ms

## 2017-03-20 ENCOUNTER — Encounter: Payer: Self-pay | Admitting: Podiatry

## 2017-03-20 ENCOUNTER — Ambulatory Visit: Payer: Medicare HMO | Admitting: Podiatry

## 2017-03-20 DIAGNOSIS — M7662 Achilles tendinitis, left leg: Secondary | ICD-10-CM

## 2017-03-20 MED ORDER — MELOXICAM 15 MG PO TABS: 15.0000 mg | ORAL_TABLET | Freq: Every day | ORAL | 3 refills | Status: DC

## 2017-03-20 MED ORDER — METHYLPREDNISOLONE 4 MG PO TBPK: ORAL_TABLET | ORAL | 0 refills | Status: DC

## 2017-03-20 NOTE — Patient Instructions (Signed)

## 2017-03-20 NOTE — Progress Notes (Signed)
She presents today for a flareup of her plantar fasciitis of her left foot only.  She reports that her foot has been hurting times 3-4 months and is very tender on the back of the heel and has been taking ibuprofen and stretching to no relief.  Objective: Vital signs are stable alert oriented x3 pulses are palpable no calf pain.  Neurologic sensorium is intact.  She has pain on palpation of the distal aspect of the Achilles at its insertion site left heel.  Assessment: His Achilles insertional tendinitis.  Plan: I injected the area today with dexamethasone local anesthetic a total of 2 mg was utilized.  Placed her in a Cam Walker and discussed appropriate use of the Cam walker will follow up with her in 1 month

## 2017-04-17 ENCOUNTER — Ambulatory Visit: Payer: Medicare HMO | Admitting: Podiatry

## 2017-04-29 ENCOUNTER — Ambulatory Visit: Payer: Medicare HMO | Admitting: Podiatry

## 2017-05-28 ENCOUNTER — Telehealth: Payer: Self-pay | Admitting: Cardiology

## 2017-05-28 ENCOUNTER — Ambulatory Visit (INDEPENDENT_AMBULATORY_CARE_PROVIDER_SITE_OTHER): Payer: Medicare HMO | Admitting: *Deleted

## 2017-05-28 DIAGNOSIS — I428 Other cardiomyopathies: Secondary | ICD-10-CM | POA: Diagnosis not present

## 2017-05-28 NOTE — Telephone Encounter (Signed)
LMOVM reminding pt to send remote transmission.   

## 2017-05-29 ENCOUNTER — Encounter: Payer: Self-pay | Admitting: Cardiology

## 2017-05-29 LAB — CUP PACEART REMOTE DEVICE CHECK
Battery Remaining Longevity: 100 mo
HIGH POWER IMPEDANCE MEASURED VALUE: 55 Ohm
HighPow Impedance: 47 Ohm
Implantable Lead Location: 753860
Implantable Lead Model: 185
Implantable Pulse Generator Implant Date: 20150508
Lead Channel Pacing Threshold Amplitude: 0.875 V
Lead Channel Pacing Threshold Pulse Width: 0.4 ms
Lead Channel Setting Pacing Amplitude: 2 V
Lead Channel Setting Pacing Pulse Width: 0.4 ms
MDC IDC LEAD IMPLANT DT: 20150508
MDC IDC LEAD SERIAL: 194163
MDC IDC MSMT BATTERY VOLTAGE: 3 V
MDC IDC MSMT LEADCHNL RV IMPEDANCE VALUE: 475 Ohm
MDC IDC MSMT LEADCHNL RV IMPEDANCE VALUE: 475 Ohm
MDC IDC MSMT LEADCHNL RV SENSING INTR AMPL: 10.75 mV
MDC IDC MSMT LEADCHNL RV SENSING INTR AMPL: 10.75 mV
MDC IDC SESS DTM: 20190501043426
MDC IDC SET LEADCHNL RV SENSING SENSITIVITY: 0.3 mV
MDC IDC STAT BRADY RV PERCENT PACED: 0.01 %

## 2017-05-29 NOTE — Progress Notes (Signed)
Remote ICD transmission.   

## 2017-07-31 ENCOUNTER — Other Ambulatory Visit: Payer: Self-pay | Admitting: Physical Medicine & Rehabilitation

## 2017-07-31 DIAGNOSIS — M5416 Radiculopathy, lumbar region: Secondary | ICD-10-CM

## 2017-08-07 ENCOUNTER — Ambulatory Visit
Admission: RE | Admit: 2017-08-07 | Discharge: 2017-08-07 | Disposition: A | Discharge status: Home / Self Care | Payer: Medicare HMO | Source: Ambulatory Visit | Attending: Physical Medicine & Rehabilitation | Admitting: Physical Medicine & Rehabilitation

## 2017-08-07 DIAGNOSIS — M479 Spondylosis, unspecified: Secondary | ICD-10-CM | POA: Insufficient documentation

## 2017-08-07 DIAGNOSIS — M5416 Radiculopathy, lumbar region: Secondary | ICD-10-CM | POA: Insufficient documentation

## 2017-08-22 DIAGNOSIS — M76829 Posterior tibial tendinitis, unspecified leg: Secondary | ICD-10-CM | POA: Insufficient documentation

## 2017-08-27 ENCOUNTER — Telehealth: Payer: Self-pay

## 2017-08-27 ENCOUNTER — Ambulatory Visit (INDEPENDENT_AMBULATORY_CARE_PROVIDER_SITE_OTHER): Payer: Medicare HMO | Admitting: *Deleted

## 2017-08-27 DIAGNOSIS — I428 Other cardiomyopathies: Secondary | ICD-10-CM | POA: Diagnosis not present

## 2017-08-27 NOTE — Telephone Encounter (Signed)
LMOVM reminding pt to send remote transmission.   

## 2017-08-28 ENCOUNTER — Encounter: Payer: Self-pay | Admitting: Cardiology

## 2017-08-28 NOTE — Progress Notes (Signed)
Remote ICD transmission.   

## 2017-09-09 LAB — CUP PACEART REMOTE DEVICE CHECK
Battery Remaining Longevity: 99 mo
Battery Voltage: 2.99 V
Brady Statistic RV Percent Paced: 0 %
Date Time Interrogation Session: 20190731113728
HighPow Impedance: 45 Ohm
HighPow Impedance: 55 Ohm
Implantable Lead Implant Date: 20150508
Implantable Lead Location: 753860
Implantable Lead Serial Number: 194163
Implantable Pulse Generator Implant Date: 20150508
Lead Channel Impedance Value: 475 Ohm
Lead Channel Impedance Value: 475 Ohm
Lead Channel Pacing Threshold Amplitude: 0.5 V
Lead Channel Sensing Intrinsic Amplitude: 10 mV
Lead Channel Setting Pacing Amplitude: 2 V
Lead Channel Setting Sensing Sensitivity: 0.3 mV
MDC IDC MSMT LEADCHNL RV PACING THRESHOLD PULSEWIDTH: 0.4 ms
MDC IDC MSMT LEADCHNL RV SENSING INTR AMPL: 10 mV
MDC IDC SET LEADCHNL RV PACING PULSEWIDTH: 0.4 ms

## 2017-09-12 ENCOUNTER — Other Ambulatory Visit: Payer: Self-pay | Admitting: Internal Medicine

## 2017-09-12 DIAGNOSIS — Z1231 Encounter for screening mammogram for malignant neoplasm of breast: Secondary | ICD-10-CM

## 2017-09-26 ENCOUNTER — Ambulatory Visit
Admission: RE | Admit: 2017-09-26 | Discharge: 2017-09-26 | Disposition: A | Discharge status: Home / Self Care | Payer: Medicare HMO | Source: Ambulatory Visit | Attending: Internal Medicine | Admitting: Internal Medicine

## 2017-09-26 DIAGNOSIS — Z1231 Encounter for screening mammogram for malignant neoplasm of breast: Secondary | ICD-10-CM | POA: Diagnosis not present

## 2017-10-29 ENCOUNTER — Encounter: Payer: Self-pay | Admitting: Obstetrics and Gynecology

## 2017-10-29 ENCOUNTER — Ambulatory Visit: Payer: Medicare HMO | Admitting: Obstetrics and Gynecology

## 2017-10-29 VITALS — BP 110/69 | HR 85 | Ht 68.0 in | Wt 266.7 lb

## 2017-10-29 DIAGNOSIS — N816 Rectocele: Secondary | ICD-10-CM

## 2017-10-29 DIAGNOSIS — N8110 Cystocele, unspecified: Secondary | ICD-10-CM

## 2017-10-29 DIAGNOSIS — L750 Bromhidrosis: Secondary | ICD-10-CM

## 2017-10-29 LAB — POCT URINALYSIS DIPSTICK
Blood, UA: NEGATIVE
Glucose, UA: NEGATIVE
Ketones, UA: NEGATIVE
Leukocytes, UA: NEGATIVE
Nitrite, UA: NEGATIVE
Protein, UA: POSITIVE — AB
Spec Grav, UA: >=1.03 — AB (ref 1.010–1.025)
Urobilinogen, UA: 0.2 E.U./dL
pH, UA: 6 (ref 5.0–8.0)

## 2017-10-29 NOTE — Progress Notes (Signed)
Pt is present today due a prolapse bladder. Pt stated that at night she noticed that she is leaking at night and sometimes during the day.

## 2017-10-29 NOTE — Progress Notes (Signed)
GYNECOLOGY CLINIC PROGRESS NOTE Subjective:    Lisa Fuentes is a 57 y.o. G34P3003 female who presents for evaluation of a cystocele and urinary odor. Does not believe she has any incontinence but does note moisture in vaginal area at night.  Notes urinary frequency x 2 weeks, urinary odor x 6 months. Also notes having to splint to have bowel movements (has been ongoing for ~ 1 year). Has tried Kegel exercises which don't seem to be helping. She does have a past surgical history of hysterectomy with BSO.  Menstrual History: Menarche age: 28 No LMP recorded. Patient has had a hysterectomy.    The following portions of the patient's history were reviewed and updated as appropriate: allergies, current medications, past family history, past medical history, past social history, past surgical history and problem list.  Review of Systems Pertinent items noted in HPI and remainder of comprehensive ROS otherwise negative.   . Past Surgical History:  Procedure Laterality Date  . BREAST CYST ASPIRATION Left 03/22/14   neg/ done by Dr Jamal Collin  . CARDIAC CATHETERIZATION  2003   [facility]: No significant coronary artery disease with reduced ejection fraction.  Marland Kitchen CARDIAC DEFIBRILLATOR PLACEMENT    . COLONOSCOPY  2015  . COLONOSCOPY WITH PROPOFOL N/A 10/04/2015   Procedure: COLONOSCOPY WITH PROPOFOL;  Surgeon: Lucilla Lame, MD;  Location: [facility];  Service: Endoscopy;  Laterality: N/A;  . CORONARY ANGIOPLASTY    . CYSTOSCOPY  11/15/2014   Procedure: CYSTOSCOPY;  Surgeon: Rubie Maid, MD;  Location: [facility] ORS;  Service: Gynecology;;  . FOOT SURGERY    . HAND SURGERY    . KNEE ARTHROSCOPY Left 03/02/2015   Procedure: ARTHROSCOPY  LEFT KNEE, PARTIAL LATERAL  MENISECTOMY, SYNOVECTOMY, MEDIAL & LATERAL CHONDROPLASTY;  Surgeon: Thornton Park, MD;  Location: [facility] ORS;  Service: Orthopedics;  Laterality: Left;  Dr. Mack Guise wanted her surgery switched from [facility] because of the  Defibrillator.  patient has a Defibrillator  takes Asprin/knows to stop 5days before  has had some test done  . LAPAROSCOPIC SALPINGO OOPHERECTOMY Left 11/15/2014   Procedure: LAPAROSCOPIC OOPHORECTOMY;  Surgeon: Rubie Maid, MD;  Location: [facility] ORS;  Service: Gynecology;  Laterality: Left;  . LAPAROSCOPIC SALPINGOOPHERECTOMY Right 09/2013   also with left salpingectomy  . TONSILLECTOMY    . TOTAL ABDOMINAL HYSTERECTOMY    . TRACHELECTOMY N/A 11/15/2014   Procedure: TRACHELECTOMY;  Surgeon: Rubie Maid, MD;  Location: [facility] ORS;  Service: Gynecology;  Laterality: N/A;  . TUBAL LIGATION Bilateral     Objective:     BP 110/69   Pulse 85   Ht 5\' 8"  (1.727 m)   Wt 266 lb 11.2 oz (121 kg)   BMI 40.55 kg/m  General appearance: alert and no distress  Abdomen: soft, non-tender; bowel sounds normal; no masses,  no organomegaly Pelvic exam:   VULVA: normal appearing vulva with no masses, tenderness or lesions,   VAGINA: normal appearing vagina with normal color and discharge, no lesions,   PELVIC FLOOR EXAM: rectocele Grade 2, cystocele Grade 2, vaginal prolapse Grade 1  CERVIX: surgically absent,   UTERUS: surgically absent, vaginal cuff well healed,   ADNEXA: surgically absent bilateral,   RECTAL: rectovaginal exam confirms pelvic findings. Extremities: extremities normal, atraumatic, no cyanosis or edema Neurologic: Grossly normal    Labs:  Results for orders placed or performed in visit on 10/29/17  POCT urinalysis dipstick  Result Value Ref Range   Color, UA yellow    Clarity, UA clear  Glucose, UA Negative Negative   Bilirubin, UA 1+    Ketones, UA neg    Spec Grav, UA >=1.030 (A) 1.010 - 1.025   Blood, UA neg    pH, UA 6.0 5.0 - 8.0   Protein, UA Positive (A) Negative   Urobilinogen, UA 0.2 0.2 or 1.0 E.U./dL   Nitrite, UA neg    Leukocytes, UA Negative Negative   Appearance yellow    Odor      Assessment:    The patient has a cystocele and rectocele  with  mild vaginal prolapse  Urinary odor  Plan:   - Discussed cystoceles/rectoceles and management options with the patient. All questions answered. Neurosurgeon distributed. - Discussed pessary however patient declines use.  - Discussed surgical repair. Patient would like to proceed. The risks of surgery were discussed in detail with the patient including but not limited to: bleeding which may require transfusion or reoperation; infection which may require prolonged hospitalization or re-hospitalization and antibiotic therapy; injury to bowel, bladder, ureters and major vessels or other surrounding organs; need for additional procedures including laparotomy; thromboembolic phenomenon, incisional problems and other postoperative or anesthesia complications.  Patient was told that the likelihood that her condition and symptoms will be treated effectively with this surgical management was very high; the postoperative expectations were also discussed in detail. The patient also understands the alternative treatment options which were discussed in full. All questions were answered.  She was told that she will be contacted by our surgical scheduler regarding the time and date of her surgery; routine preoperative instructions of having nothing to eat or drink after midnight on the day prior to surgery and also coming to the hospital 1.5 hours prior to her time of surgery were also emphasized. To be scheduled for anterior/posterior repair on 11/25/2017.  - Urinary odor, no evidence of UTI today. Denies urgency, stress leaking.  Advised on alternating between tissue and moist flushable wipes to assist in management of odor.    Rubie Maid, MD [facility]

## 2017-10-30 ENCOUNTER — Encounter: Payer: Self-pay | Admitting: Obstetrics and Gynecology

## 2017-10-30 NOTE — H&P (Signed)
@[place] @   GYNECOLOGY PREOPERATIVE HISTORY AND PHYSICAL   Subjective:  Lisa Fuentes is a 57 y.o. G3P0 here for surgical management of cystocele and rectocele, with mild vaginal prolapse.  No significant preoperative concerns.   She does have a defibrillator in place.   Proposed surgery: Anterior and posterior colporrhaphy.    Pertinent Gynecological History: Menses: post-menopausal, and s/p hysterectomy Last mammogram: normal Date: 08/2017 Last pap: normal Date: ~ 9-10 years ago, prior to hysterectomy.    Past Medical History:  Diagnosis Date  . Anemia   . Anxiety    panic attacks  . Arthritis   . CHF (congestive heart failure) ([place])   . Clotting disorder ([place])   . Coronary artery disease   . Depression   . Difficult intubation   . Dysrhythmia   . Endometriosis of vagina 09/2013   Intra-operative findings of endometriosis implants on cervical stump  . Headache   . Hypercholesteremia   . Hypertension   . Hypokalemia   . Hypothyroidism   . Implantable defibrillator    [employer] DOI 2008, replacement 2015  . Menopausal symptoms   . Nonischemic cardiomyopathy ([facility])    Interval improvement EF 40-45% 1/16  . Shortness of breath dyspnea    chronic doe  . Sleep apnea    Treated with CPAP  . Tachycardia   . Thyroid disease    Past Surgical History:  Procedure Laterality Date  . BREAST CYST ASPIRATION Left 03/22/14   neg/ done by Dr Jamal Collin  . CARDIAC CATHETERIZATION  2003   [facility]: No significant coronary artery disease with reduced ejection fraction.  Marland Kitchen CARDIAC DEFIBRILLATOR PLACEMENT    . COLONOSCOPY  2015  . COLONOSCOPY WITH PROPOFOL N/A 10/04/2015   Procedure: COLONOSCOPY WITH PROPOFOL;  Surgeon: Lucilla Lame, MD;  Location: [facility];  Service: Endoscopy;  Laterality: N/A;  . CORONARY ANGIOPLASTY    . CYSTOSCOPY  11/15/2014   Procedure: CYSTOSCOPY;  Surgeon: Rubie Maid, MD;  Location: [facility];  Service: Gynecology;;  . FOOT SURGERY    . HAND SURGERY     . KNEE ARTHROSCOPY Left 03/02/2015   Procedure: ARTHROSCOPY  LEFT KNEE, PARTIAL LATERAL  MENISECTOMY, SYNOVECTOMY, MEDIAL & LATERAL CHONDROPLASTY;  Surgeon: Thornton Park, MD;  Location: [facility] OR[facility];  Service: Orthopedics;  Laterality: Left;  Dr. Mack Guise wanted her surgery switched from [facility] because of the Defibrillator.  patient has a Defibrillator  takes Asprin/knows to stop 5days before  has had some test done  . LAPAROSCOPIC SALPINGO OOPHERECTOMY Left 11/15/2014   Procedure: LAPAROSCOPIC OOPHORECTOMY;  Surgeon: Rubie Maid, MD;  Location: [facility] ORS;  Service: Gynecology;  Laterality: Left;  . LAPAROSCOPIC SALPINGOOPHERECTOMY Right 09/2013   also with left salpingectomy  . TONSILLECTOMY    . TOTAL ABDOMINAL HYSTERECTOMY    . TRACHELECTOMY N/A 11/15/2014   Procedure: TRACHELECTOMY;  Surgeon: Rubie Maid, MD;  Location: [facility] ORS;  Service: Gynecology;  Laterality: N/A;  . TUBAL LIGATION Bilateral     OB History  Gravida Para Term Preterm AB Living  3         3  SAB TAB Ectopic Multiple Live Births               # Outcome Date GA Lbr Len/2nd Weight Sex Delivery Anes PTL Lv  3 Grav[place]           2 Gravida           1 [place]  Obstetric Comments  1st Menstrual Cycle:  12  1st Pregnancy:  83     Family History  Problem Relation Age of Onset  . Hypertension Mother   . Hyperlipidemia Mother   . Stroke Mother   . Colon cancer Mother   . Breast cancer Sister   . Ovarian cancer Sister     Social History   Socioeconomic History  . Marital status: Divorced    Spouse name: Not on file  . Number of children: Not on file  . Years of education: Not on file  . Highest education level: Not on file  Occupational History  . Not on file  Social Needs  . Financial resource strain: Not on file  . Food insecurity:    Worry: Not on file    Inability: Not on file  . Transportation needs:    Medical: Not on file    Non-medical: Not on file  Tobacco Use   . Smoking status: Never Smoker  . Smokeless tobacco: Never Used  Substance and Sexual Activity  . Alcohol use: Yes    Alcohol/week: 0.0 standard drinks    Comment: 5 q week  . Drug use: No  . Sexual activity: Not Currently    Birth control/protection: None  Lifestyle  . Physical activity:    Days per week: Not on file    Minutes per session: Not on file  . Stress: Not on file  Relationships  . Social connections:    Talks on phone: Not on file    Gets together: Not on file    Attends religious service: Not on file    Active member of club or organization: Not on file    Attends meetings of clubs or organizations: Not on file    Relationship status: Not on file  . Intimate partner violence:    Fear of current or ex partner: Not on file    Emotionally abused: Not on file    Physically abused: Not on file    Forced sexual activity: Not on file  Other Topics Concern  . Not on file  Social History Narrative  . Not on file    Current Outpatient Medications on File Prior to Visit  Medication Sig Dispense Refill  . ALPRAZolam (XANAX) 0.25 MG tablet Take 0.25 mg by mouth daily as needed for anxiety. As needed     . aspirin EC 81 MG tablet Take 1 tablet (81 mg total) by mouth daily.    . carvedilol (COREG) 25 MG tablet Take 0.5 tablets (12.5 mg total) by mouth 2 (two) times daily. 90 tablet 2  . chlorzoxazone (PARAFON) 500 MG tablet Take 500 mg by mouth as needed.     . cyclobenzaprine (FLEXERIL) 10 MG tablet Take 10 mg by mouth 3 (three) times daily as needed for muscle spasms.    Marland Kitchen esomeprazole (NEXIUM) 40 MG capsule Take 40 mg by mouth every morning.     . furosemide (LASIX) 40 MG tablet Take 40 mg by mouth daily. TAKE ONE TABLET BY MOUTH ONCE DAILY *PATIENT OCCASIONALLY TAKES AN EXTRA TABLET*    . Levothyroxine Sodium 150 MCG CAPS Take by mouth daily before breakfast.    . loratadine (CLARITIN) 10 MG tablet Take 10 mg by mouth every morning.     . meloxicam (MOBIC) 15 MG  tablet Take 1 tablet (15 mg total) by mouth daily. 30 tablet 3  . nystatin (MYCOSTATIN) powder Apply topically 4 (four) times daily as needed. Reported on  08/18/2015    . sertraline (ZOLOFT) 50 MG tablet Take 50 mg by mouth daily.    Marland Kitchen spironolactone (ALDACTONE) 25 MG tablet Take 0.5 tablets (12.5 mg total) by mouth daily. 45 tablet 2  . tiZANidine (ZANAFLEX) 2 MG tablet Take 2 mg by mouth every 6 (six) hours as needed for muscle spasms.    . citalopram (CELEXA) 40 MG tablet Take 40 mg by mouth every morning. TAKE ONE TABLET BY MOUTH ONCE DAILY    . ibuprofen (ADVIL,MOTRIN) 800 MG tablet Take 800 mg by mouth every 8 (eight) hours as needed.    . methylPREDNISolone (MEDROL DOSEPAK) 4 MG TBPK tablet 6 day dose pack - take as directed (Patient not taking: Reported on 10/29/2017) 21 tablet 0  . oseltamivir (TAMIFLU) 75 MG capsule      No current facility-administered medications on file prior to visit.     Allergies  Allergen Reactions  . Sulfamethoxazole-Trimethoprim Hives      Review of Systems Constitutional: No recent fever/chills/sweats Respiratory: No recent cough/bronchitis Cardiovascular: No chest pain Gastrointestinal: No recent nausea/vomiting/diarrhea Genitourinary: No UTI symptoms Hematologic/lymphatic:No history of coagulopathy or recent blood thinner use    Objective:   Blood pressure 110/69, pulse 85, height 5\' 8"  (1.727 m), weight 266 lb 11.2 oz (121 kg). CONSTITUTIONAL: Well-developed, well-nourished female in no acute distress.  HENT:  Normocephalic, atraumatic, External right and left ear normal. Oropharynx is clear and moist EYES: Conjunctivae and EOM are normal. Pupils are equal, round, and reactive to light. No scleral icterus.  NECK: Normal range of motion, supple, no masses SKIN: Skin is warm and dry. No rash noted. Not diaphoretic. No erythema. No pallor. NEUROLOGIC: Alert and oriented to person, place, and time. Normal reflexes, muscle tone coordination. No  cranial nerve deficit noted. PSYCHIATRIC: Normal mood and affect. Normal behavior. Normal judgment and thought content. CARDIOVASCULAR: Normal heart rate noted, regular rhythm RESPIRATORY: Effort and breath sounds normal, no problems with respiration noted ABDOMEN: Soft, nontender, nondistended. PELVIC:  VULVA: normal appearing vulva with no masses, tenderness or lesions,              VAGINA: normal appearing vagina with normal color and discharge, no lesions,              PELVIC FLOOR EXAM: rectocele Grade 2, cystocele Grade 2, vaginal prolapse Grade 1             CERVIX: surgically absent,              UTERUS: surgically absent, vaginal cuff well healed,              ADNEXA: surgically absent bilateral,              RECTAL: rectovaginal exam confirms pelvic findings. MUSCULOSKELETAL: Normal range of motion. No edema and no tenderness. 2+ distal pulses.    Labs: Results for orders placed or performed in visit on 10/29/17 (from the past 336 hour(s))  POCT urinalysis dipstick   Collection Time: 10/29/17  4:30 PM  Result Value Ref Range   Color, UA yellow    Clarity, UA clear    Glucose, UA Negative Negative   Bilirubin, UA 1+    Ketones, UA neg    Spec Grav, UA >=1.030 (A) 1.010 - 1.025   Blood, UA neg    pH, UA 6.0 5.0 - 8.0   Protein, UA Positive (A) Negative   Urobilinogen, UA 0.2 0.2 or 1.0 E.U./dL   Nitrite, UA neg  Leukocytes, UA Negative Negative   Appearance yellow    Odor       Imaging Studies: No results found.  Assessment:    Cystocele with rectocele and mild vaginal prolapse.   Plan:    Counseling: Procedure, risks, reasons, benefits and complications (including injury to bowel, bladder, major blood vessel, ureter, bleeding, possibility of transfusion, infection, or fistula formation) reviewed in detail. Likelihood of success in alleviating the patient's condition was discussed. Routine postoperative instructions will be reviewed with the patient and her  family in detail after surgery.  The patient concurred with the proposed plan, giving informed written consent for the surgery.   Preop testing ordered. Instructions reviewed, including NPO after midnight.     Rubie Maid, MD [facility]

## 2017-10-30 NOTE — H&P (View-Only) (Signed)
@[facility] @   GYNECOLOGY PREOPERATIVE HISTORY AND PHYSICAL   Subjective:  Lisa Fuentes is a 57 y.o. G3P0 here for surgical management of cystocele and rectocele, with mild vaginal prolapse.  No significant preoperative concerns.   She does have a defibrillator in place.   Proposed surgery: Anterior and posterior colporrhaphy.    Pertinent Gynecological History: Menses: post-menopausal, and s/p hysterectomy Last mammogram: normal Date: 08/2017 Last pap: normal Date: ~ 9-10 years ago, prior to hysterectomy.    Past Medical History:  Diagnosis Date  . Anemia   . Anxiety    panic attacks  . Arthritis   . CHF (congestive heart failure) ([place])   . Clotting disorder ([place])   . Coronary artery disease   . Depression   . Difficult intubation   . Dysrhythmia   . Endometriosis of vagina 09/2013   Intra-operative findings of endometriosis implants on cervical stump  . Headache   . Hypercholesteremia   . Hypertension   . Hypokalemia   . Hypothyroidism   . Implantable defibrillator    [employer] DOI 2008, replacement 2015  . Menopausal symptoms   . Nonischemic cardiomyopathy (HCC)    Interval improvement EF 40-45% 1/16  . Shortness of breath dyspnea    chronic doe  . Sleep apnea    Treated with CPAP  . Tachycardia   . Thyroid disease    Past Surgical History:  Procedure Laterality Date  . BREAST CYST ASPIRATION Left 03/22/14   neg/ done by Dr Jamal Collin  . CARDIAC CATHETERIZATION  2003   [facility]: No significant coronary artery disease with reduced ejection fraction.  Marland Kitchen CARDIAC DEFIBRILLATOR PLACEMENT    . COLONOSCOPY  2015  . COLONOSCOPY WITH PROPOFOL N/A 10/04/2015   Procedure: COLONOSCOPY WITH PROPOFOL;  Surgeon: Lucilla Lame, MD;  Location: [facility] ENDOSCOPY;  Service: Endoscopy;  Laterality: N/A;  . CORONARY ANGIOPLASTY    . CYSTOSCOPY  11/15/2014   Procedure: CYSTOSCOPY;  Surgeon: Rubie Maid, MD;  Location: [facility] ORS;  Service: Gynecology;;  . FOOT SURGERY    . HAND SURGERY     . KNEE ARTHROSCOPY Left 03/02/2015   Procedure: ARTHROSCOPY  LEFT KNEE, PARTIAL LATERAL  MENISECTOMY, SYNOVECTOMY, MEDIAL & LATERAL CHONDROPLASTY;  Surgeon: Thornton Park, MD;  Location: [facility] ORS;  Service: Orthopedics;  Laterality: Left;  Dr. Mack Guise wanted her surgery switched from [facility] because of the Defibrillator.  patient has a Defibrillator  takes Asprin/knows to stop 5days before  has had some test done  . LAPAROSCOPIC SALPINGO OOPHERECTOMY Left 11/15/2014   Procedure: LAPAROSCOPIC OOPHORECTOMY;  Surgeon: Rubie Maid, MD;  Location: [facility] ORS;  Service: Gynecology;  Laterality: Left;  . LAPAROSCOPIC SALPINGOOPHERECTOMY Right 09/2013   also with left salpingectomy  . TONSILLECTOMY    . TOTAL ABDOMINAL HYSTERECTOMY    . TRACHELECTOMY N/A 11/15/2014   Procedure: TRACHELECTOMY;  Surgeon: Rubie Maid, MD;  Location: [facility] ORS;  Service: Gynecology;  Laterality: N/A;  . TUBAL LIGATION Bilateral     OB History  Gravida Para Term Preterm AB Living  3         3  SAB TAB Ectopic Multiple Live Births               # Outcome Date GA Lbr Len/2nd Weight Sex Delivery Anes PTL Lv  3 Gravida           2 Gravida           1 [place]  Obstetric Comments  1st Menstrual Cycle:  12  1st Pregnancy:  70     Family History  Problem Relation Age of Onset  . Hypertension Mother   . Hyperlipidemia Mother   . Stroke Mother   . Colon cancer Mother   . Breast cancer Sister   . Ovarian cancer Sister     Social History   Socioeconomic History  . Marital status: Divorced    Spouse name: Not on file  . Number of children: Not on file  . Years of education: Not on file  . Highest education level: Not on file  Occupational History  . Not on file  Social Needs  . Financial resource strain: Not on file  . Food insecurity:    Worry: Not on file    Inability: Not on file  . Transportation needs:    Medical: Not on file    Non-medical: Not on file  Tobacco Use   . Smoking status: Never Smoker  . Smokeless tobacco: Never Used  Substance and Sexual Activity  . Alcohol use: Yes    Alcohol/week: 0.0 standard drinks    Comment: 5 q week  . Drug use: No  . Sexual activity: Not Currently    Birth control/protection: None  Lifestyle  . Physical activity:    Days per week: Not on file    Minutes per session: Not on file  . Stress: Not on file  Relationships  . Social connections:    Talks on phone: Not on file    Gets together: Not on file    Attends religious service: Not on file    Active member of club or organization: Not on file    Attends meetings of clubs or organizations: Not on file    Relationship status: Not on file  . Intimate partner violence:    Fear of current or ex partner: Not on file    Emotionally abused: Not on file    Physically abused: Not on file    Forced sexual activity: Not on file  Other Topics Concern  . Not on file  Social History Narrative  . Not on file    Current Outpatient Medications on File Prior to Visit  Medication Sig Dispense Refill  . ALPRAZolam (XANAX) 0.25 MG tablet Take 0.25 mg by mouth daily as needed for anxiety. As needed     . aspirin EC 81 MG tablet Take 1 tablet (81 mg total) by mouth daily.    . carvedilol (COREG) 25 MG tablet Take 0.5 tablets (12.5 mg total) by mouth 2 (two) times daily. 90 tablet 2  . chlorzoxazone (PARAFON) 500 MG tablet Take 500 mg by mouth as needed.     . cyclobenzaprine (FLEXERIL) 10 MG tablet Take 10 mg by mouth 3 (three) times daily as needed for muscle spasms.    Marland Kitchen esomeprazole (NEXIUM) 40 MG capsule Take 40 mg by mouth every morning.     . furosemide (LASIX) 40 MG tablet Take 40 mg by mouth daily. TAKE ONE TABLET BY MOUTH ONCE DAILY *PATIENT OCCASIONALLY TAKES AN EXTRA TABLET*    . Levothyroxine Sodium 150 MCG CAPS Take by mouth daily before breakfast.    . loratadine (CLARITIN) 10 MG tablet Take 10 mg by mouth every morning.     . meloxicam (MOBIC) 15 MG  tablet Take 1 tablet (15 mg total) by mouth daily. 30 tablet 3  . nystatin (MYCOSTATIN) powder Apply topically 4 (four) times daily as needed. Reported on  08/18/2015    . sertraline (ZOLOFT) 50 MG tablet Take 50 mg by mouth daily.    Marland Kitchen spironolactone (ALDACTONE) 25 MG tablet Take 0.5 tablets (12.5 mg total) by mouth daily. 45 tablet 2  . tiZANidine (ZANAFLEX) 2 MG tablet Take 2 mg by mouth every 6 (six) hours as needed for muscle spasms.    . citalopram (CELEXA) 40 MG tablet Take 40 mg by mouth every morning. TAKE ONE TABLET BY MOUTH ONCE DAILY    . ibuprofen (ADVIL,MOTRIN) 800 MG tablet Take 800 mg by mouth every 8 (eight) hours as needed.    . methylPREDNISolone (MEDROL DOSEPAK) 4 MG TBPK tablet 6 day dose pack - take as directed (Patient not taking: Reported on 10/29/2017) 21 tablet 0  . oseltamivir (TAMIFLU) 75 MG capsule      No current facility-administered medications on file prior to visit.     Allergies  Allergen Reactions  . Sulfamethoxazole-Trimethoprim Hives      Review of Systems Constitutional: No recent fever/chills/sweats Respiratory: No recent cough/bronchitis Cardiovascular: No chest pain Gastrointestinal: No recent nausea/vomiting/diarrhea Genitourinary: No UTI symptoms Hematologic/lymphatic:No history of coagulopathy or recent blood thinner use    Objective:   Blood pressure 110/69, pulse 85, height 5\' 8"  (1.727 m), weight 266 lb 11.2 oz (121 kg). CONSTITUTIONAL: Well-developed, well-nourished female in no acute distress.  HENT:  Normocephalic, atraumatic, External right and left ear normal. Oropharynx is clear and moist EYES: Conjunctivae and EOM are normal. Pupils are equal, round, and reactive to light. No scleral icterus.  NECK: Normal range of motion, supple, no masses SKIN: Skin is warm and dry. No rash noted. Not diaphoretic. No erythema. No pallor. NEUROLOGIC: Alert and oriented to person, place, and time. Normal reflexes, muscle tone coordination. No  cranial nerve deficit noted. PSYCHIATRIC: Normal mood and affect. Normal behavior. Normal judgment and thought content. CARDIOVASCULAR: Normal heart rate noted, regular rhythm RESPIRATORY: Effort and breath sounds normal, no problems with respiration noted ABDOMEN: Soft, nontender, nondistended. PELVIC:  VULVA: normal appearing vulva with no masses, tenderness or lesions,              VAGINA: normal appearing vagina with normal color and discharge, no lesions,              PELVIC FLOOR EXAM: rectocele Grade 2, cystocele Grade 2, vaginal prolapse Grade 1             CERVIX: surgically absent,              UTERUS: surgically absent, vaginal cuff well healed,              ADNEXA: surgically absent bilateral,              RECTAL: rectovaginal exam confirms pelvic findings. MUSCULOSKELETAL: Normal range of motion. No edema and no tenderness. 2+ distal pulses.    Labs: Results for orders placed or performed in visit on 10/29/17 (from the past 336 hour(s))  POCT urinalysis dipstick   Collection Time: 10/29/17  4:30 PM  Result Value Ref Range   Color, UA yellow    Clarity, UA clear    Glucose, UA Negative Negative   Bilirubin, UA 1+    Ketones, UA neg    Spec Grav, UA >=1.030 (A) 1.010 - 1.025   Blood, UA neg    pH, UA 6.0 5.0 - 8.0   Protein, UA Positive (A) Negative   Urobilinogen, UA 0.2 0.2 or 1.0 E.U./dL   Nitrite, UA neg  Leukocytes, UA Negative Negative   Appearance yellow    Odor       Imaging Studies: No results found.  Assessment:    Cystocele with rectocele and mild vaginal prolapse.   Plan:    Counseling: Procedure, risks, reasons, benefits and complications (including injury to bowel, bladder, major blood vessel, ureter, bleeding, possibility of transfusion, infection, or fistula formation) reviewed in detail. Likelihood of success in alleviating the patient's condition was discussed. Routine postoperative instructions will be reviewed with the patient and her  family in detail after surgery.  The patient concurred with the proposed plan, giving informed written consent for the surgery.   Preop testing ordered. Instructions reviewed, including NPO after midnight.     Rubie Maid, MD [facility]

## 2017-11-04 ENCOUNTER — Encounter: Payer: Medicare HMO | Admitting: Obstetrics and Gynecology

## 2017-11-19 ENCOUNTER — Encounter
Admission: RE | Admit: 2017-11-19 | Discharge: 2017-11-19 | Disposition: A | Discharge status: Home / Self Care | Payer: Medicare HMO | Source: Ambulatory Visit | Attending: Obstetrics and Gynecology | Admitting: Obstetrics and Gynecology

## 2017-11-19 ENCOUNTER — Other Ambulatory Visit: Payer: Self-pay

## 2017-11-19 DIAGNOSIS — I110 Hypertensive heart disease with heart failure: Secondary | ICD-10-CM | POA: Insufficient documentation

## 2017-11-19 DIAGNOSIS — Z01818 Encounter for other preprocedural examination: Secondary | ICD-10-CM | POA: Diagnosis not present

## 2017-11-19 DIAGNOSIS — I509 Heart failure, unspecified: Secondary | ICD-10-CM | POA: Diagnosis not present

## 2017-11-19 HISTORY — DX: Gastro-esophageal reflux disease without esophagitis: K21.9

## 2017-11-19 LAB — BASIC METABOLIC PANEL
Anion gap: 8 (ref 5–15)
BUN: 23 mg/dL — ABNORMAL HIGH (ref 6–20)
CALCIUM: 9.5 mg/dL (ref 8.9–10.3)
CO2: 28 mmol/L (ref 22–32)
CREATININE: 0.86 mg/dL (ref 0.44–1.00)
Chloride: 104 mmol/L (ref 98–111)
GFR calc Af Amer: >60 mL/min (ref >60)
GFR calc non Af Amer: >60 mL/min (ref >60)
Glucose, Bld: 87 mg/dL (ref 70–99)
Potassium: 4.6 mmol/L (ref 3.5–5.1)
SODIUM: 140 mmol/L (ref 135–145)

## 2017-11-19 LAB — CBC
HCT: 46.9 % — ABNORMAL HIGH (ref 36.0–46.0)
Hemoglobin: 14.8 g/dL (ref 12.0–15.0)
MCH: 30.1 pg (ref 26.0–34.0)
MCHC: 31.6 g/dL (ref 30.0–36.0)
MCV: 95.3 fL (ref 80.0–100.0)
PLATELETS: 114 10*3/uL — AB (ref 150–400)
RBC: 4.92 MIL/uL (ref 3.87–5.11)
RDW: 14.4 % (ref 11.5–15.5)
WBC: 7.5 10*3/uL (ref 4.0–10.5)
nRBC: 0 % (ref 0.0–0.2)

## 2017-11-19 NOTE — Patient Instructions (Signed)
Your procedure is scheduled on: Monday 11/25/17 Report to [place]. To find out your arrival time please call [phone] between 1PM - 3PM on Friday 11/22/17 .  Remember: Instructions that are not followed completely may result in serious medical risk, up to and including death, or upon the discretion of your surgeon and anesthesiologist your surgery may need to be rescheduled.     _X__ 1. Do not eat food after midnight the night before your procedure.                 No gum chewing or hard candies. You may drink clear liquids up to 2 hours                 before you are scheduled to arrive for your surgery- DO not drink clear                 liquids within 2 hours of the start of your surgery.                 Clear Liquids include:  water, apple juice without pulp, clear carbohydrate                 drink such as Clearfast or Gatorade, Black Coffee or Tea (Do not add                 anything to coffee or tea).  __X__2.  On the morning of surgery brush your teeth with toothpaste and water, you                 may rinse your mouth with mouthwash if you wish.  Do not swallow any              toothpaste of mouthwash.     _X__ 3.  No Alcohol for 24 hours before or after surgery.   _X__ 4.  Do Not Smoke or use e-cigarettes For 24 Hours Prior to Your Surgery.                 Do not use any chewable tobacco products for at least 6 hours prior to                 surgery.  ____  5.  Bring all medications with you on the day of surgery if instructed.   __X__  6.  Notify your doctor if there is any change in your medical condition      (cold, fever, infections).     Do not wear jewelry, make-up, hairpins, clips or nail polish. Do not wear lotions, powders, or perfumes.  Do not shave 48 hours prior to surgery. Men may shave face and neck. Do not bring valuables to the hospital.    [facility] is not responsible for any belongings or  valuables.  Contacts, dentures/partials or body piercings may not be worn into surgery. Bring a case for your contacts, glasses or hearing aids, a denture cup will be supplied. Leave your suitcase in the car. After surgery it may be brought to your room. For patients admitted to the hospital, discharge time is determined by your treatment team.   Patients discharged the day of surgery will not be allowed to drive home.   Please read over the following fact sheets that you were given:   MRSA Information  __X__ Take these medicines the morning of surgery with A SIP OF WATER:  1. carvedilol (COREG)  2. cetirizine (ZYRTEC)   3. esomeprazole (NEXIUM)  4. levothyroxine (SYNTHROID, LEVOTHROID)  5. sertraline (ZOLOFT)   6.  ____ Fleet Enema (as directed)   ____ Use CHG Soap/SAGE wipes as directed  ____ Use inhalers on the day of surgery  ____ Stop metformin/Janumet/Farxiga 2 days prior to surgery    ____ Take 1/2 of usual insulin dose the night before surgery. No insulin the morning          of surgery.   ____ Stop Blood Thinners Coumadin/Plavix/Xarelto/Pleta/Pradaxa/Eliquis/Effient/Aspirin  on   Or contact your Surgeon, Cardiologist or Medical Doctor regarding  ability to stop your blood thinners  __X__ Stop Anti-inflammatories 7 days before surgery such as Advil, Ibuprofen, Motrin,  BC or Goodies Powder, Naprosyn, Naproxen, Aleve, Aspirin    __X__ Stop all herbal supplements, fish oil or vitamin E until after surgery.    ____ Bring C-Pap to the hospital.

## 2017-11-19 NOTE — Pre-Procedure Instructions (Signed)
FAXED CARDIAC DEVICE PROGRAMMING SHEET TO DR Fletcher Anon

## 2017-11-20 ENCOUNTER — Emergency Department
Admission: EM | Admit: 2017-11-20 | Discharge: 2017-11-20 | Discharge status: Against Medical Advice | Payer: Medicare HMO | Attending: Emergency Medicine | Admitting: Emergency Medicine

## 2017-11-20 ENCOUNTER — Other Ambulatory Visit: Payer: Self-pay

## 2017-11-20 ENCOUNTER — Encounter: Payer: Self-pay | Admitting: Emergency Medicine

## 2017-11-20 DIAGNOSIS — R109 Unspecified abdominal pain: Secondary | ICD-10-CM | POA: Insufficient documentation

## 2017-11-20 DIAGNOSIS — Z5321 Procedure and treatment not carried out due to patient leaving prior to being seen by health care provider: Secondary | ICD-10-CM | POA: Insufficient documentation

## 2017-11-20 NOTE — ED Triage Notes (Addendum)
Patient ambulatory to triage with steady gait, without difficulty or distress noted; pt reports urinary frequency with leg cramping tonight; denies any c/o pain at present

## 2017-11-20 NOTE — ED Notes (Signed)
Explained to pt purpose of protocols; pt declines to have blood drawn and st that she does not want to wait and be seen; st had labwork this morning; explained to pt importance of having labs rechecked since things can change quickly but cont to refuse and doesn't want to be seen now

## 2017-11-25 ENCOUNTER — Ambulatory Visit: Payer: Medicare HMO | Admitting: Certified Registered"

## 2017-11-25 ENCOUNTER — Encounter
Admission: RE | Disposition: A | Discharge status: Home / Self Care | Payer: Self-pay | Source: Ambulatory Visit | Attending: Obstetrics and Gynecology

## 2017-11-25 ENCOUNTER — Other Ambulatory Visit: Payer: Self-pay

## 2017-11-25 ENCOUNTER — Encounter: Payer: Self-pay | Admitting: *Deleted

## 2017-11-25 ENCOUNTER — Observation Stay
Admission: RE | Admit: 2017-11-25 | Discharge: 2017-11-26 | Disposition: A | Discharge status: Home / Self Care | Payer: Medicare HMO | Source: Ambulatory Visit | Attending: Obstetrics and Gynecology | Admitting: Obstetrics and Gynecology

## 2017-11-25 DIAGNOSIS — Z23 Encounter for immunization: Secondary | ICD-10-CM | POA: Insufficient documentation

## 2017-11-25 DIAGNOSIS — N8110 Cystocele, unspecified: Principal | ICD-10-CM | POA: Insufficient documentation

## 2017-11-25 DIAGNOSIS — I2510 Atherosclerotic heart disease of native coronary artery without angina pectoris: Secondary | ICD-10-CM | POA: Insufficient documentation

## 2017-11-25 DIAGNOSIS — Z95810 Presence of automatic (implantable) cardiac defibrillator: Secondary | ICD-10-CM | POA: Diagnosis not present

## 2017-11-25 DIAGNOSIS — Z882 Allergy status to sulfonamides status: Secondary | ICD-10-CM | POA: Insufficient documentation

## 2017-11-25 DIAGNOSIS — N816 Rectocele: Secondary | ICD-10-CM

## 2017-11-25 DIAGNOSIS — F329 Major depressive disorder, single episode, unspecified: Secondary | ICD-10-CM | POA: Diagnosis not present

## 2017-11-25 DIAGNOSIS — R32 Unspecified urinary incontinence: Secondary | ICD-10-CM | POA: Diagnosis not present

## 2017-11-25 DIAGNOSIS — G4730 Sleep apnea, unspecified: Secondary | ICD-10-CM | POA: Diagnosis not present

## 2017-11-25 DIAGNOSIS — I5022 Chronic systolic (congestive) heart failure: Secondary | ICD-10-CM | POA: Diagnosis not present

## 2017-11-25 DIAGNOSIS — F419 Anxiety disorder, unspecified: Secondary | ICD-10-CM | POA: Diagnosis not present

## 2017-11-25 DIAGNOSIS — E039 Hypothyroidism, unspecified: Secondary | ICD-10-CM | POA: Diagnosis not present

## 2017-11-25 DIAGNOSIS — Z7982 Long term (current) use of aspirin: Secondary | ICD-10-CM | POA: Insufficient documentation

## 2017-11-25 DIAGNOSIS — I110 Hypertensive heart disease with heart failure: Secondary | ICD-10-CM | POA: Diagnosis not present

## 2017-11-25 DIAGNOSIS — Z79899 Other long term (current) drug therapy: Secondary | ICD-10-CM | POA: Insufficient documentation

## 2017-11-25 DIAGNOSIS — Z9889 Other specified postprocedural states: Secondary | ICD-10-CM

## 2017-11-25 DIAGNOSIS — E7800 Pure hypercholesterolemia, unspecified: Secondary | ICD-10-CM | POA: Insufficient documentation

## 2017-11-25 HISTORY — PX: ANTERIOR AND POSTERIOR REPAIR: SHX5121

## 2017-11-25 SURGERY — ANTERIOR (CYSTOCELE) AND POSTERIOR REPAIR (RECTOCELE)
Anesthesia: General

## 2017-11-25 MED ORDER — MAGNESIUM CITRATE PO SOLN
1.0000 | Freq: Once | ORAL | Status: DC | PRN
  Filled 2017-11-25: qty 296

## 2017-11-25 MED ORDER — KETOROLAC TROMETHAMINE 30 MG/ML IJ SOLN: 30.0000 mg | Freq: Four times a day (QID) | INTRAMUSCULAR | Status: DC

## 2017-11-25 MED ORDER — ROCURONIUM BROMIDE 100 MG/10ML IV SOLN
INTRAVENOUS | Status: DC | PRN
  Administered 2017-11-25: 20 mg via INTRAVENOUS

## 2017-11-25 MED ORDER — PNEUMOCOCCAL VAC POLYVALENT 25 MCG/0.5ML IJ INJ
0.5000 mL | INJECTION | INTRAMUSCULAR | Status: AC
  Administered 2017-11-26: 0.5 mL via INTRAMUSCULAR
  Filled 2017-11-25 (×2): qty 0.5

## 2017-11-25 MED ORDER — SUCCINYLCHOLINE CHLORIDE 20 MG/ML IJ SOLN
INTRAMUSCULAR | Status: DC | PRN
  Administered 2017-11-25: 120 mg via INTRAVENOUS

## 2017-11-25 MED ORDER — DIPHENHYDRAMINE HCL 25 MG PO CAPS
25.0000 mg | ORAL_CAPSULE | ORAL | Status: DC | PRN
  Administered 2017-11-26: 25 mg via ORAL
  Filled 2017-11-25: qty 1

## 2017-11-25 MED ORDER — VASOPRESSIN 20 UNIT/ML IV SOLN
INTRAVENOUS | Status: AC
  Filled 2017-11-25: qty 1

## 2017-11-25 MED ORDER — DEXAMETHASONE SODIUM PHOSPHATE 10 MG/ML IJ SOLN
INTRAMUSCULAR | Status: DC | PRN
  Administered 2017-11-25: 10 mg via INTRAVENOUS

## 2017-11-25 MED ORDER — CARVEDILOL 12.5 MG PO TABS
12.5000 mg | ORAL_TABLET | Freq: Two times a day (BID) | ORAL | Status: DC
  Administered 2017-11-26: 12.5 mg via ORAL
  Filled 2017-11-25 (×2): qty 1

## 2017-11-25 MED ORDER — SERTRALINE HCL 25 MG PO TABS
50.0000 mg | ORAL_TABLET | Freq: Every day | ORAL | Status: DC
  Administered 2017-11-26: 50 mg via ORAL
  Filled 2017-11-25: qty 2

## 2017-11-25 MED ORDER — ESMOLOL HCL 100 MG/10ML IV SOLN
INTRAVENOUS | Status: DC | PRN
  Administered 2017-11-25: 20 mg via INTRAVENOUS
  Administered 2017-11-25 (×2): 10 mg via INTRAVENOUS

## 2017-11-25 MED ORDER — ESTROGENS, CONJUGATED 0.625 MG/GM VA CREA
TOPICAL_CREAM | VAGINAL | Status: AC
  Filled 2017-11-25: qty 30

## 2017-11-25 MED ORDER — ONDANSETRON HCL 4 MG/2ML IJ SOLN: 4.0000 mg | Freq: Four times a day (QID) | INTRAMUSCULAR | Status: DC | PRN

## 2017-11-25 MED ORDER — PROPOFOL 10 MG/ML IV BOLUS
INTRAVENOUS | Status: DC | PRN
  Administered 2017-11-25: 20 mg via INTRAVENOUS
  Administered 2017-11-25: 150 mg via INTRAVENOUS

## 2017-11-25 MED ORDER — ALUM & MAG HYDROXIDE-SIMETH 200-200-20 MG/5ML PO SUSP: 30.0000 mL | ORAL | Status: DC | PRN

## 2017-11-25 MED ORDER — SUGAMMADEX SODIUM 200 MG/2ML IV SOLN
INTRAVENOUS | Status: DC | PRN
  Administered 2017-11-25: 250 mg via INTRAVENOUS

## 2017-11-25 MED ORDER — FUROSEMIDE 40 MG PO TABS
40.0000 mg | ORAL_TABLET | Freq: Every day | ORAL | Status: DC
  Administered 2017-11-26: 40 mg via ORAL
  Filled 2017-11-25: qty 1

## 2017-11-25 MED ORDER — ONDANSETRON HCL 4 MG/2ML IJ SOLN
INTRAMUSCULAR | Status: DC | PRN
  Administered 2017-11-25: 4 mg via INTRAVENOUS

## 2017-11-25 MED ORDER — MIDAZOLAM HCL 2 MG/2ML IJ SOLN
INTRAMUSCULAR | Status: AC
  Filled 2017-11-25: qty 2

## 2017-11-25 MED ORDER — SIMETHICONE 80 MG PO CHEW
80.0000 mg | CHEWABLE_TABLET | Freq: Four times a day (QID) | ORAL | Status: DC | PRN
  Administered 2017-11-25: 80 mg via ORAL
  Filled 2017-11-25 (×2): qty 1

## 2017-11-25 MED ORDER — PROPOFOL 10 MG/ML IV BOLUS
INTRAVENOUS | Status: AC
  Filled 2017-11-25: qty 40

## 2017-11-25 MED ORDER — MENTHOL 3 MG MT LOZG
1.0000 | LOZENGE | OROMUCOSAL | Status: DC | PRN
  Filled 2017-11-25: qty 9

## 2017-11-25 MED ORDER — ASPIRIN EC 81 MG PO TBEC
81.0000 mg | DELAYED_RELEASE_TABLET | Freq: Every day | ORAL | Status: DC
  Administered 2017-11-26: 81 mg via ORAL
  Filled 2017-11-25: qty 1

## 2017-11-25 MED ORDER — BISACODYL 10 MG RE SUPP: 10.0000 mg | Freq: Every day | RECTAL | Status: DC | PRN

## 2017-11-25 MED ORDER — SPIRONOLACTONE 25 MG PO TABS
12.5000 mg | ORAL_TABLET | Freq: Every day | ORAL | Status: DC
  Administered 2017-11-26: 12.5 mg via ORAL
  Filled 2017-11-25: qty 0.5

## 2017-11-25 MED ORDER — ONDANSETRON HCL 4 MG PO TABS: 4.0000 mg | ORAL_TABLET | Freq: Four times a day (QID) | ORAL | Status: DC | PRN

## 2017-11-25 MED ORDER — SODIUM CHLORIDE 0.9 % IJ SOLN
INTRAMUSCULAR | Status: AC
  Filled 2017-11-25: qty 50

## 2017-11-25 MED ORDER — SENNOSIDES-DOCUSATE SODIUM 8.6-50 MG PO TABS: 1.0000 | ORAL_TABLET | Freq: Every evening | ORAL | Status: DC | PRN

## 2017-11-25 MED ORDER — FENTANYL CITRATE (PF) 100 MCG/2ML IJ SOLN
25.0000 ug | INTRAMUSCULAR | Status: DC | PRN
  Administered 2017-11-25 (×4): 25 ug via INTRAVENOUS

## 2017-11-25 MED ORDER — DOCUSATE SODIUM 100 MG PO CAPS
100.0000 mg | ORAL_CAPSULE | Freq: Two times a day (BID) | ORAL | Status: DC
  Filled 2017-11-25 (×2): qty 1

## 2017-11-25 MED ORDER — LACTATED RINGERS IV SOLN
INTRAVENOUS | Status: DC
  Administered 2017-11-25 – 2017-11-26 (×3): via INTRAVENOUS

## 2017-11-25 MED ORDER — FENTANYL CITRATE (PF) 100 MCG/2ML IJ SOLN
INTRAMUSCULAR | Status: AC
  Administered 2017-11-25: 25 ug via INTRAVENOUS
  Filled 2017-11-25: qty 2

## 2017-11-25 MED ORDER — SODIUM CHLORIDE 0.9 % IJ SOLN
INTRAMUSCULAR | Status: DC | PRN
  Administered 2017-11-25: 4 mL

## 2017-11-25 MED ORDER — PANTOPRAZOLE SODIUM 40 MG PO TBEC
40.0000 mg | DELAYED_RELEASE_TABLET | Freq: Every day | ORAL | Status: DC
  Administered 2017-11-25 – 2017-11-26 (×2): 40 mg via ORAL
  Filled 2017-11-25 (×2): qty 1

## 2017-11-25 MED ORDER — KETOROLAC TROMETHAMINE 30 MG/ML IJ SOLN
30.0000 mg | Freq: Four times a day (QID) | INTRAMUSCULAR | Status: DC
  Administered 2017-11-25 – 2017-11-26 (×3): 30 mg via INTRAVENOUS
  Filled 2017-11-25 (×4): qty 1

## 2017-11-25 MED ORDER — HYDROMORPHONE HCL 1 MG/ML IJ SOLN
0.2000 mg | INTRAMUSCULAR | Status: DC | PRN
  Administered 2017-11-26: 0.6 mg via INTRAVENOUS
  Filled 2017-11-25: qty 1

## 2017-11-25 MED ORDER — LACTATED RINGERS IV SOLN
INTRAVENOUS | Status: DC
  Administered 2017-11-25 (×2): via INTRAVENOUS

## 2017-11-25 MED ORDER — ONDANSETRON HCL 4 MG/2ML IJ SOLN: 4.0000 mg | Freq: Once | INTRAMUSCULAR | Status: DC | PRN

## 2017-11-25 MED ORDER — IBUPROFEN 600 MG PO TABS
600.0000 mg | ORAL_TABLET | Freq: Four times a day (QID) | ORAL | Status: DC | PRN
  Administered 2017-11-26: 600 mg via ORAL
  Filled 2017-11-25: qty 1

## 2017-11-25 MED ORDER — LEVOTHYROXINE SODIUM 150 MCG PO TABS
150.0000 ug | ORAL_TABLET | Freq: Every day | ORAL | Status: DC
  Administered 2017-11-26: 150 ug via ORAL
  Filled 2017-11-25: qty 1

## 2017-11-25 MED ORDER — FENTANYL CITRATE (PF) 250 MCG/5ML IJ SOLN
INTRAMUSCULAR | Status: AC
  Filled 2017-11-25: qty 5

## 2017-11-25 MED ORDER — LIDOCAINE HCL (CARDIAC) PF 100 MG/5ML IV SOSY
PREFILLED_SYRINGE | INTRAVENOUS | Status: DC | PRN
  Administered 2017-11-25: 100 mg via INTRAVENOUS

## 2017-11-25 MED ORDER — FENTANYL CITRATE (PF) 100 MCG/2ML IJ SOLN
INTRAMUSCULAR | Status: DC | PRN
  Administered 2017-11-25: 50 ug via INTRAVENOUS
  Administered 2017-11-25 (×2): 25 ug via INTRAVENOUS

## 2017-11-25 MED ORDER — OXYCODONE-ACETAMINOPHEN 5-325 MG PO TABS: 1.0000 | ORAL_TABLET | ORAL | Status: DC | PRN

## 2017-11-25 MED ORDER — MIDAZOLAM HCL 2 MG/2ML IJ SOLN
INTRAMUSCULAR | Status: DC | PRN
  Administered 2017-11-25: 2 mg via INTRAVENOUS

## 2017-11-25 MED ORDER — SODIUM CHLORIDE 0.9 % IV SOLN
INTRAVENOUS | Status: DC | PRN
  Administered 2017-11-25: 10 ug/min via INTRAVENOUS

## 2017-11-25 MED ORDER — OXYCODONE-ACETAMINOPHEN 5-325 MG PO TABS
2.0000 | ORAL_TABLET | ORAL | Status: DC | PRN
  Administered 2017-11-26 (×2): 2 via ORAL
  Filled 2017-11-25 (×2): qty 2

## 2017-11-25 MED ORDER — ZOLPIDEM TARTRATE 5 MG PO TABS: 5.0000 mg | ORAL_TABLET | Freq: Every evening | ORAL | Status: DC | PRN

## 2017-11-25 MED ORDER — INFLUENZA VAC SPLIT QUAD 0.5 ML IM SUSY
0.5000 mL | PREFILLED_SYRINGE | INTRAMUSCULAR | Status: AC
  Administered 2017-11-26: 0.5 mL via INTRAMUSCULAR
  Filled 2017-11-25: qty 0.5

## 2017-11-25 SURGICAL SUPPLY — 29 items
BAG COUNTER SPONGE EZ (MISCELLANEOUS) ×2 IMPLANT
BAG URINE DRAINAGE (UROLOGICAL SUPPLIES) ×3 IMPLANT
BINDER ABDOMINAL 12 ML 46-62 (SOFTGOODS) ×3 IMPLANT
CANISTER SUCT 1200ML W/VALVE (MISCELLANEOUS) ×3 IMPLANT
CATH FOLEY 2WAY  5CC 16FR (CATHETERS) ×2
CATH URTH 16FR FL 2W BLN LF (CATHETERS) ×1 IMPLANT
COUNTER SPONGE BAG EZ (MISCELLANEOUS) ×1
COVER WAND RF STERILE (DRAPES) ×3 IMPLANT
DRAPE PERI LITHO V/GYN (MISCELLANEOUS) ×3 IMPLANT
DRAPE SHEET LG 3/4 BI-LAMINATE (DRAPES) ×3 IMPLANT
DRAPE UNDER BUTTOCK W/FLU (DRAPES) ×3 IMPLANT
ELECT REM PT RETURN 9FT ADLT (ELECTROSURGICAL) ×3
ELECTRODE REM PT RTRN 9FT ADLT (ELECTROSURGICAL) ×1 IMPLANT
GAUZE PACK 2X3YD (MISCELLANEOUS) ×3 IMPLANT
GLOVE BIO SURGEON STRL SZ 6.5 (GLOVE) ×6 IMPLANT
GLOVE BIO SURGEONS STRL SZ 6.5 (GLOVE) ×3
GLOVE INDICATOR 7.0 STRL GRN (GLOVE) ×9 IMPLANT
GOWN STRL REUS W/ TWL LRG LVL3 (GOWN DISPOSABLE) ×2 IMPLANT
GOWN STRL REUS W/TWL LRG LVL3 (GOWN DISPOSABLE) ×4
KIT TURNOVER CYSTO (KITS) ×3 IMPLANT
LABEL OR SOLS (LABEL) ×3 IMPLANT
NS IRRIG 500ML POUR BTL (IV SOLUTION) ×3 IMPLANT
PACK BASIN MINOR ARMC (MISCELLANEOUS) ×3 IMPLANT
PAD PREP 24X41 OB/GYN DISP (PERSONAL CARE ITEMS) ×3 IMPLANT
SUT CHROMIC 2 0 CT 1 (SUTURE) ×9 IMPLANT
SUT VIC AB 0 CT1 36 (SUTURE) ×6 IMPLANT
SUT VIC AB 0 CT2 27 (SUTURE) IMPLANT
SUT VIC AB 2-0 UR6 27 (SUTURE) ×9 IMPLANT
SYR 10ML LL (SYRINGE) ×3 IMPLANT

## 2017-11-25 NOTE — Anesthesia Postprocedure Evaluation (Signed)
Anesthesia Post Note  Patient: Mildreth Reek Kingsford  Procedure(s) Performed: ANTERIOR (CYSTOCELE) AND POSTERIOR REPAIR (RECTOCELE) (N/A )  Patient location during evaluation: PACU Anesthesia Type: General Level of consciousness: awake and alert and oriented Pain management: pain level controlled Vital Signs Assessment: post-procedure vital signs reviewed and stable Respiratory status: spontaneous breathing, nonlabored ventilation and respiratory function stable Cardiovascular status: blood pressure returned to baseline and stable Postop Assessment: no signs of nausea or vomiting Anesthetic complications: no     Last Vitals:  Vitals:   11/25/17 1310 11/25/17 1318  BP: 110/74 111/76  Pulse: 81 79  Resp: 17 17  Temp: 37.1 C   SpO2: 100% 97%    Last Pain:  Vitals:   11/25/17 1310  TempSrc:   PainSc: 2                  Marshell Dilauro

## 2017-11-25 NOTE — Anesthesia Procedure Notes (Signed)
Procedure Name: Intubation Date/Time: 11/25/2017 10:46 AM Performed by: Adalberto Ill, CRNA Pre-anesthesia Checklist: Patient identified, Emergency Drugs available, Suction available, Patient being monitored and Timeout performed Patient Re-evaluated:Patient Re-evaluated prior to induction Oxygen Delivery Method: Circle system utilized Preoxygenation: Pre-oxygenation with 100% oxygen Induction Type: IV induction Ventilation: Mask ventilation without difficulty Laryngoscope Size: Mac, 3 and Glidescope Grade View: Grade I Tube type: Oral Tube size: 7.0 mm Number of attempts: 1 (brief atraumatic ) Airway Equipment and Method: Stylet and Video-laryngoscopy Placement Confirmation: ETT inserted through vocal cords under direct vision,  positive ETCO2 and breath sounds checked- equal and bilateral Secured at: 22 cm Tube secured with: Tape Dental Injury: Teeth and Oropharynx as per pre-operative assessment

## 2017-11-25 NOTE — Transfer of Care (Signed)
Immediate Anesthesia Transfer of Care Note  Patient: Lisa Fuentes  Procedure(s) Performed: ANTERIOR (CYSTOCELE) AND POSTERIOR REPAIR (RECTOCELE) (N/A )  Patient Location: PACU  Anesthesia Type:General  Level of Consciousness: awake, alert , oriented and patient cooperative  Airway & Oxygen Therapy: Patient Spontanous Breathing and Patient connected to face mask oxygen  Post-op Assessment: Report given to RN and Post -op Vital signs reviewed and stable  Post vital signs: Reviewed and stable  Last Vitals:  Vitals Value Taken Time  BP 112/80 11/25/2017 12:18 PM  Temp    Pulse 85 11/25/2017 12:23 PM  Resp 13 11/25/2017 12:23 PM  SpO2 100 % 11/25/2017 12:23 PM  Vitals shown include unvalidated device data.  Last Pain:  Vitals:   11/25/17 0855  TempSrc: Oral  PainSc: 8          Complications: No apparent anesthesia complications

## 2017-11-25 NOTE — Anesthesia Preprocedure Evaluation (Addendum)
Anesthesia Evaluation  Patient identified by MRN, date of birth, ID band Patient awake    Reviewed: Allergy & Precautions, NPO status , Patient's Chart, lab work & pertinent test results  History of Anesthesia Complications (+) DIFFICULT AIRWAY and history of anesthetic complications  Airway Mallampati: II  TM Distance: <3 FB Neck ROM: Limited    Dental  (+) Partial Upper, Missing, Partial Lower   Pulmonary shortness of breath and with exertion, sleep apnea and Continuous Positive Airway Pressure Ventilation , neg COPD, neg recent URI,    breath sounds clear to auscultation       Cardiovascular Exercise Tolerance: Poor hypertension, Pt. on medications and Pt. on home beta blockers + CAD and +CHF  (-) Past MI, (-) Cardiac Stents and (-) CABG + dysrhythmias (history of blood clot in her heart) Atrial Fibrillation + Cardiac Defibrillator (-) Valvular Problems/Murmurs Rhythm:Regular Rate:Normal  Cardiomyopathy. EF in 2000 was 20%, has improved to 45%. The AICD was inserted when her EF was 20%. N afib. On ASA, no other blood thinners.   Neuro/Psych  Headaches, neg Seizures PSYCHIATRIC DISORDERS (Depression)    GI/Hepatic Neg liver ROS, GERD  ,  Endo/Other  neg diabetesHypothyroidism Morbid obesityTreated hypothyroidism.  Renal/GU negative Renal ROS  negative genitourinary   Musculoskeletal  (+) Arthritis , Osteoarthritis,    Abdominal (+) + obese,   Peds  Hematology negative hematology ROS (+) anemia ,   Anesthesia Other Findings   Reproductive/Obstetrics                           Anesthesia Physical  Anesthesia Plan  ASA: IV  Anesthesia Plan: General   Post-op Pain Management:    Induction: Intravenous  PONV Risk Score and Plan:   Airway Management Planned: Oral ETT and Video Laryngoscope Planned  Additional Equipment:   Intra-op Plan:   Post-operative Plan: Extubation in  OR  Informed Consent: I have reviewed the patients History and Physical, chart, labs and discussed the procedure including the risks, benefits and alternatives for the proposed anesthesia with the patient or authorized representative who has indicated his/her understanding and acceptance.     Plan Discussed with: CRNA, Anesthesiologist and Surgeon  Anesthesia Plan Comments:         Anesthesia Quick Evaluation

## 2017-11-25 NOTE — Anesthesia Post-op Follow-up Note (Signed)
Anesthesia QCDR form completed.        

## 2017-11-25 NOTE — Interval H&P Note (Signed)
History and Physical Interval Note:  11/25/2017 10:11 AM  Lisa Fuentes  has presented today for surgery, with the diagnosis of CYSTOCELE WITH RECTOCELE  The various methods of treatment have been discussed with the patient and family. After consideration of risks, benefits and other options for treatment, the patient has consented to  Procedure(s): ANTERIOR (CYSTOCELE) AND POSTERIOR REPAIR (RECTOCELE) (N/A) as a surgical intervention .  The patient's history has been reviewed, patient examined, no change in status, stable for surgery.  I have reviewed the patient's chart and labs.  Questions were answered to the patient's satisfaction.     Rubie Maid, MD [facility]

## 2017-11-25 NOTE — Progress Notes (Signed)
Dr Marcelline Mates aware of amt of vaginal bleeding

## 2017-11-25 NOTE — Progress Notes (Signed)
Applied abd binder

## 2017-11-25 NOTE — Op Note (Signed)
Procedure(s): ANTERIOR (CYSTOCELE) AND POSTERIOR REPAIR (RECTOCELE) Procedure Note  Lisa Fuentes female 57 y.o. 11/25/2017  Indications: The patient is a 57 y.o. G2P0 female with cystocele, rectocele, and urinary incontinence, splinting with bowel movements.  Pre-operative Diagnosis: Cystocele, rectocele, urinary incontinence, splinting with bowel movements  Post-operative Diagnosis: Same, with small vaginal prolapse  Surgeon: Rubie Maid, MD  Assistants:  Jeannie Fend, MD. No other capable assistant available in surgery requiring a high level assistant.   Anesthesia: General endotracheal anesthesia  Procedure Details: The patient was seen in the Holding Room. The risks, benefits, complications, treatment options, and expected outcomes were discussed with the patient.  The patient concurred with the proposed plan, giving informed consent.  The site of surgery properly noted/marked. The patient was taken to the Operating Room, identified as Lisa Fuentes and the procedure verified as Procedure(s) (LRB): ANTERIOR (CYSTOCELE) AND POSTERIOR REPAIR (RECTOCELE) (N/A). A Time Out was held and the above information confirmed.  She was then placed under general anesthesia without difficulty. She was placed in the dorsal lithotomy position, and was prepped and draped in a sterile manner.  A foley catheter was inserted.  A sterile speculum was inserted into the vagina.  The anterior vagina was grasped at 2 o'clock and 8 o'clock with Alice clamps, and the area to be incised was injected with normal saline. Beginning at the apex, a midline incision was made and extended to within 1cm of the external urethral meatus. The vaginal mucosa was sharply dissected laterally. the paraurethral and paravescial fascia was mobilized by blunt and sharp dissection.  Interrupted sutures of 2.0 Vicryl were placed in the paraurethral fascia to the pubourethral ligament at the urethral-vesical junction. The remainder  of the paraurethral fascia was reapproximated using similar sutures. The redundant vaginal mucosa was excised from each midline edge. The anterior vaginal mucosa was closed with interruptedsutures of 2.0 Chromic.  Attention was then turned to the posterior vaginal wall.  Two Allis clamps were placed about 2cm apart on the posterior part of the introitus.  Another Allis clamp was placed at the apex of the rectocele. The area to be incised was injected with normal saline. Beginning at the apex, a midline incision was made and extended to the level of the introitus.   The portion of vaginal mucosa overlying this central defect was also excised.  The rectocele repair was then performed in multiple layers.  The rectal endopelvic fascia was plicated in the midline using interrupted 2-0 Vicryl sutures with care given to prevent unintentional rectal penetration.  The mucosal edges of the incision from the apex to the introitus were then reapproximated using interrupted sutures with 2-0 Chromic.   The incisional edges from the introitus to the perineal body were then reapproximated using the remainder of the stitch from the apex of the rectocele repair.  The vaginal mucosa was noted to have excellent hemostasis, and blood loss was minimal.  Rectal exam following the repair was normal.  The patient tolerated the procedure well.  All laparotomy sponges, instruments, needles, and sponge counts were correct x2.  The foley catheter was removed. The patient was taken to the recovery room in stable condition.    Findings: A mild Grade 1-2 cystocele. Small vaginal vault prolapse Rectocele Grade 2  Normal perineal body.   Estimated Blood Loss:  200 ml      Drains: foley catheterization prior to procedure with  300 ml of clear urine         Total  IV Fluids: 1000 ml  Specimens: None         Implants: None         Complications:  None; patient tolerated the procedure well.         Disposition: PACU -  hemodynamically stable.         Condition: stable   Rubie Maid, MD [facility]

## 2017-11-26 ENCOUNTER — Ambulatory Visit (INDEPENDENT_AMBULATORY_CARE_PROVIDER_SITE_OTHER): Payer: Medicare HMO | Admitting: *Deleted

## 2017-11-26 ENCOUNTER — Other Ambulatory Visit: Payer: Self-pay | Admitting: Obstetrics and Gynecology

## 2017-11-26 ENCOUNTER — Telehealth: Payer: Self-pay | Admitting: Cardiology

## 2017-11-26 ENCOUNTER — Encounter: Payer: Self-pay | Admitting: Obstetrics and Gynecology

## 2017-11-26 DIAGNOSIS — N8110 Cystocele, unspecified: Secondary | ICD-10-CM | POA: Diagnosis not present

## 2017-11-26 DIAGNOSIS — I428 Other cardiomyopathies: Secondary | ICD-10-CM

## 2017-11-26 MED ORDER — HYDROCODONE-ACETAMINOPHEN 5-325 MG PO TABS: 1.0000 | ORAL_TABLET | Freq: Four times a day (QID) | ORAL | 0 refills | Status: DC | PRN

## 2017-11-26 MED ORDER — OXYCODONE-ACETAMINOPHEN 5-325 MG PO TABS: 1.0000 | ORAL_TABLET | Freq: Four times a day (QID) | ORAL | 0 refills | Status: DC | PRN

## 2017-11-26 MED ORDER — HYDROCODONE-ACETAMINOPHEN 5-325 MG PO TABS: 1.0000 | ORAL_TABLET | Freq: Four times a day (QID) | ORAL | 0 refills | Status: AC | PRN

## 2017-11-26 MED ORDER — DOCUSATE SODIUM 100 MG PO CAPS: 100.0000 mg | ORAL_CAPSULE | Freq: Every day | ORAL | 2 refills | Status: DC | PRN

## 2017-11-26 NOTE — Progress Notes (Signed)
Post-Operative Da # 1, s/p anterior and posterior repair.  Subjective: no complaints, up ad lib, voiding, tolerating PO and + flatus   Patient Active Problem List   Diagnosis Date Noted  . Chest pain of uncertain etiology 56/38/7564  . Mixed hyperlipidemia 09/10/2016  . Low back pain 03/22/2016  . Family history of malignant neoplasm of gastrointestinal tract   . Dizziness 03/27/2015  . Postoperative state 11/15/2014  . LV dysfunction 10/29/2014  . Knee pain 10/22/2014  . Endometriosis 09/18/2014  . ICD (implantable cardioverter-defibrillator) in place 02/02/2014  . Chronic systolic heart failure ([place])   . Essential hypertension   . Hypercholesteremia   . Sleep apnea 05/12/2013  . Hyperthyroidism 05/08/2013  . Dilated cardiomyopathy ([place]) 05/08/2013    Objective: Temp:  [98 F (36.7 C)-98.7 F (37.1 C)] 98.5 F (36.9 C) (10/29 0454) Pulse Rate:  [75-108] 80 (10/29 0454) Resp:  [12-20] 16 (10/29 0454) BP: (96-122)/(65-95) 104/69 (10/29 0454) SpO2:  [92 %-100 %] 96 % (10/29 0454) Weight:  [332.9 kg] 118.8 kg (10/28 0855)  Physical Exam:  General: alert and no distress  Lungs: clear to auscultation bilaterally Heart: regular rate and rhythm, S1, S2 normal, no murmur, click, rub or gallop Abdomen: soft, non-tender; bowel sounds normal; no masses,  no organomegaly Pelvis:Bleeding: appropriate Extremities: DVT Evaluation: No evidence of DVT seen on physical exam. Negative Homan's sign. No cords or calf tenderness. No significant calf/ankle edema.  No results for input(s): HGB, HCT in the last 72 hours.   Lab Results  Component Value Date   CREATININE 0.86 11/19/2017     Assessment/Plan: 1 Day Post-Op Procedure(s) (LRB): ANTERIOR (CYSTOCELE) AND POSTERIOR REPAIR (RECTOCELE) (N/A) : progressing well and tolerating diet  Plan: Advance diet Encourage ambulation Advance to PO medication Discharge home today.    LOS: 0 days    Rubie Maid 11/26/2017, 8:00  AM         Rubie Maid, MD [facility]

## 2017-11-26 NOTE — Discharge Instructions (Signed)
General Gynecological Post-Operative Instructions °You may expect to feel dizzy, weak, and drowsy for as long as 24 hours after receiving the medicine that made you sleep (anesthetic).  °Do not drive a car, ride a bicycle, participate in physical activities, or take public transportation until you are done taking narcotic pain medicines or as directed by your doctor.  °Do not drink alcohol or take tranquilizers.  °Do not take medicine that has not been prescribed by your doctor.  °Do not sign important papers or make important decisions while on narcotic pain medicines.  °Have a responsible person with you.  °CARE OF INCISION  °Keep incision clean and dry. °Take showers instead of baths until your doctor gives you permission to take baths.  °Avoid heavy lifting (more than 10 pounds/4.5 kilograms), pushing, or pulling.  °Avoid activities that may risk injury to your surgical site.  °No sexual intercourse or placement of anything in the vagina for 6 weeks or as instructed by your doctor. °If you have tubes coming from the wound site, check with your doctor regarding appropriate care of the tubes. °Only take prescription or over-the-counter medicines  for pain, discomfort, or fever as directed by your doctor. Do not take aspirin. It can make you bleed. Take medicines (antibiotics) that kill germs if they are prescribed for you.  °Call the office or go to the Emergency Room if:  °You feel sick to your stomach (nauseous).  °You start to throw up (vomit).  °You have trouble eating or drinking.  °You have an oral temperature above 101.  °You have constipation that is not helped by adjusting diet or increasing fluid intake. Pain medicines are a common cause of constipation.  °You have any other concerns. °SEEK IMMEDIATE MEDICAL CARE IF:  °You have persistent dizziness.  °You have difficulty breathing or a congested sounding (croupy) cough.  °You have an oral temperature above 102.5, not controlled by medicine.  °There is  increasing pain or tenderness near or in the surgical site.  ° ° °

## 2017-11-26 NOTE — Telephone Encounter (Signed)
Spoke with pt and reminded pt of remote transmission that is due today. Pt verbalized understanding.   

## 2017-11-26 NOTE — Progress Notes (Signed)
Discharge instructions complete and prescriptions sent to pharmacy. Norco prescription printed however, Dr. Marcelline Mates spoke with RN and sent this to pharmacy. Printed prescription shredded. Patient verbalizes understanding of teaching.

## 2017-11-26 NOTE — Discharge Summary (Signed)
Gynecology Physician Postoperative Discharge Summary  Patient ID: Lisa Fuentes MRN: [id number] DOB/AGE: 1960/07/01 57 y.o.  Admit Date: 11/25/2017 Discharge Date: 11/26/2017  Preoperative Diagnoses: Cystocele, with rectocele and small vaginal vault prolapse; urinary incontinence. PMH includes CHF, CAD, HTN, Hypothyroidism and sleep apnea.    Procedures: Procedure(s) (LRB): ANTERIOR (CYSTOCELE) AND POSTERIOR REPAIR (RECTOCELE) (N/A)  Hospital Course:  [name] is a 57 y.o. [id number] admitted for scheduled surgery.  She underwent the procedures as mentioned above, her operation was uncomplicated. For further details about surgery, please refer to the operative report. Patient had an uncomplicated postoperative course. By time of discharge on POD#1, her pain was controlled on oral pain medications; she was ambulating, voiding without difficulty, tolerating regular diet and passing flatus. She was deemed stable for discharge to home.   Significant Labs: CBC Latest Ref Rng & Units 11/19/2017 03/03/2015 03/01/2015  WBC 4.0 - 10.5 K/uL 7.5 13.2(H) 4.4  Hemoglobin 12.0 - 15.0 g/dL 14.8 13.0 13.9  Hematocrit 36.0 - 46.0 % 46.9(H) 39.5 42.6  Platelets 150 - 400 K/uL 114(L) 160 162    Discharge Exam: Blood pressure 124/85, pulse 79, temperature 98.2 F (36.8 C), temperature source Oral, resp. rate 20, height 5\' 8"  (1.727 m), weight 118.8 kg, SpO2 98 %. General appearance: alert and no distress  Resp: clear to auscultation bilaterally  Cardio: regular rate and rhythm  GI: soft, non-tender; bowel sounds normal; no masses, no organomegaly.  Incision: C/D/I, no erythema, no drainage noted Pelvic: scant blood on pad  Extremities: extremities normal, atraumatic, no cyanosis or edema and Homans sign is negative, no sign of DVT  Discharged Condition: Stable  Disposition: Discharge disposition: 01-Home or Self Care       Discharge Instructions    Discharge patient   Complete by:  As  directed    Discharge disposition:  01-Home or Self Care   Discharge patient date:  11/26/2017     Allergies as of 11/26/2017      Reactions   Sulfamethoxazole-trimethoprim Hives      Medication List    TAKE these medications   aspirin EC 81 MG tablet Take 1 tablet (81 mg total) by mouth daily.   carvedilol 25 MG tablet Commonly known as:  COREG Take 0.5 tablets (12.5 mg total) by mouth 2 (two) times daily.   cetirizine 10 MG tablet Commonly known as:  ZYRTEC Take 10 mg by mouth daily.   cyclobenzaprine 10 MG tablet Commonly known as:  FLEXERIL Take 10 mg by mouth 3 (three) times daily as needed for muscle spasms.   docusate sodium 100 MG capsule Commonly known as:  COLACE Take 1 capsule (100 mg total) by mouth daily as needed for mild constipation or moderate constipation. Can increase to twice daily prn   esomeprazole 40 MG capsule Commonly known as:  NEXIUM Take 40 mg by mouth every morning.   furosemide 40 MG tablet Commonly known as:  LASIX Take 40 mg by mouth daily. TAKE ONE TABLET BY MOUTH ONCE DAILY *PATIENT OCCASIONALLY TAKES AN EXTRA TABLET*   levothyroxine 150 MCG tablet Commonly known as:  SYNTHROID, LEVOTHROID Take 150 mcg by mouth daily before breakfast.   meloxicam 15 MG tablet Commonly known as:  MOBIC Take 1 tablet (15 mg total) by mouth daily. What changed:    when to take this  reasons to take this   nystatin powder Commonly known as:  MYCOSTATIN/NYSTOP Apply 1 Bottle topically 4 (four) times daily as needed (irritaton). Reported on  08/18/2015   sertraline 50 MG tablet Commonly known as:  ZOLOFT Take 50 mg by mouth daily.   spironolactone 25 MG tablet Commonly known as:  ALDACTONE Take 0.5 tablets (12.5 mg total) by mouth daily.   tiZANidine 2 MG tablet Commonly known as:  ZANAFLEX Take 2 mg by mouth every 6 (six) hours as needed for muscle spasms.      No future appointments. Follow-up Information    Rubie Maid, MD Follow  up in 2 week(s).   Specialties:  Obstetrics and Gynecology, Radiology Why:  post-operative check Contact information: [place] [address] [phone]           Signed: Rubie Maid, MD [facility] 11/26/2017 8:12 PM

## 2017-11-27 NOTE — Progress Notes (Signed)
Remote ICD transmission.   

## 2017-12-06 ENCOUNTER — Encounter: Payer: Self-pay | Admitting: Cardiology

## 2017-12-10 ENCOUNTER — Encounter: Payer: Self-pay | Admitting: Obstetrics and Gynecology

## 2017-12-10 ENCOUNTER — Ambulatory Visit (INDEPENDENT_AMBULATORY_CARE_PROVIDER_SITE_OTHER): Payer: Medicare HMO | Admitting: Obstetrics and Gynecology

## 2017-12-10 VITALS — BP 131/90 | HR 121 | Ht 68.0 in | Wt 265.3 lb

## 2017-12-10 DIAGNOSIS — M546 Pain in thoracic spine: Secondary | ICD-10-CM

## 2017-12-10 DIAGNOSIS — G8929 Other chronic pain: Secondary | ICD-10-CM

## 2017-12-10 DIAGNOSIS — Z4889 Encounter for other specified surgical aftercare: Secondary | ICD-10-CM

## 2017-12-10 NOTE — Progress Notes (Signed)
    OBSTETRICS/GYNECOLOGY POST-OPERATIVE CLINIC VISIT  Subjective:     Lisa Fuentes is a 57 y.o. female who presents to the clinic 2 weeks status post anterior and posterior repair for pelvic relaxation (cystocele with rectocele). Eating a regular diet without difficulty. Bowel movements are normal. The patient is not having any pain.  Notes that she is no longer has to splint to have a BM, and urinary leaking and odor has ceased.  The following portions of the patient's history were reviewed and updated as appropriate: allergies, current medications, past family history, past medical history, past social history, past surgical history and problem list.  Review of Systems Musculoskeletal:positive for back pain (however notes that this has been going on prior to her recent surgery). Just had X-rays today performed.    Objective:    BP 131/90   Pulse (!) 121   Ht 5\' 8"  (1.727 m)   Wt 265 lb 4.8 oz (120.3 kg)   BMI 40.34 kg/m  General:  alert and no distress  Pelvis:   deferred    Pathology:  None  Assessment:    Doing well postoperatively.  Back pain  Plan:   1. Continue any current medications as needed. 2. Wound care discussed. 3. Operative findings again reviewed.  4. Activity restrictions: no bending, stooping, or squatting, no lifting more than 10-15 pounds and pelvic rest 5. Anticipated return to work: 4 weeks. 6. Follow up: 4 weeks for final post-op check.  7. Is being managed by her PCP for her chronic back pain.    Rubie Maid, MD [facility]

## 2017-12-10 NOTE — Progress Notes (Signed)
Pt is present today for a follow up appointment. Pt stated that she was doing well other than having a lot of back pain along with rib pain which pt is seeing a provider for.  No other complaints.

## 2018-01-07 ENCOUNTER — Encounter: Payer: Self-pay | Admitting: Obstetrics and Gynecology

## 2018-01-07 ENCOUNTER — Ambulatory Visit: Payer: Medicare HMO | Admitting: Obstetrics and Gynecology

## 2018-01-07 ENCOUNTER — Encounter: Payer: Medicare HMO | Admitting: Obstetrics and Gynecology

## 2018-01-07 VITALS — BP 113/76 | HR 82 | Ht 68.0 in | Wt 264.4 lb

## 2018-01-07 DIAGNOSIS — N816 Rectocele: Secondary | ICD-10-CM

## 2018-01-07 DIAGNOSIS — N8110 Cystocele, unspecified: Secondary | ICD-10-CM

## 2018-01-07 DIAGNOSIS — Z4889 Encounter for other specified surgical aftercare: Secondary | ICD-10-CM

## 2018-01-07 NOTE — Progress Notes (Signed)
Pt is present today for her final post-op visit. Pt stated that she is doing well no complaints.

## 2018-01-07 NOTE — Progress Notes (Signed)
    OBSTETRICS/GYNECOLOGY POST-OPERATIVE CLINIC VISIT  Subjective:     Lisa Fuentes is a 57 y.o. [id number] female who presents to the clinic 6 weeks status post anterior and posterior repair for pelvic relaxation (cystocele with rectocele) with urinary incontinence. Eating a regular diet without difficulty. Bowel movements are normal. The patient is not having any pain.  Doing well with no complaints.   The following portions of the patient's history were reviewed and updated as appropriate: allergies, current medications, past family history, past medical history, past social history, past surgical history and problem list.  Review of Systems Pertinent items noted in HPI and remainder of comprehensive ROS otherwise negative.     Objective:    BP 113/76   Pulse 82   Ht 5\' 8"  (1.727 m)   Wt 264 lb 6.4 oz (119.9 kg)   BMI 40.20 kg/m  General:  alert and no distress  Pelvis:   external genitalia normal, rectovaginal septum normal.  Vagina without discharge, well healed.  Uterus, cervix, and adnexae surgically absent.    Pathology:  None  Assessment:    Doing well postoperatively. S/p anterior/posteror repair.   Plan:   1. Doing well.  2.  Activity restrictions: none.  Can resume all activities including intercourse.  3. Anticipated return to work: now. 4. Return to clinic for any scheduled appointments or for any gynecologic concerns as needed.     Rubie Maid, MD [facility]

## 2018-01-26 LAB — CUP PACEART REMOTE DEVICE CHECK
Battery Voltage: 3 V
Brady Statistic RV Percent Paced: 0.01 %
Date Time Interrogation Session: 20191030024224
HIGH POWER IMPEDANCE MEASURED VALUE: 46 Ohm
HIGH POWER IMPEDANCE MEASURED VALUE: 54 Ohm
Implantable Lead Implant Date: 20150508
Implantable Lead Location: 753860
Implantable Lead Serial Number: 194163
Implantable Pulse Generator Implant Date: 20150508
Lead Channel Impedance Value: 456 Ohm
Lead Channel Pacing Threshold Amplitude: 0.625 V
Lead Channel Pacing Threshold Pulse Width: 0.4 ms
Lead Channel Sensing Intrinsic Amplitude: 10.5 mV
MDC IDC MSMT BATTERY REMAINING LONGEVITY: 96 mo
MDC IDC MSMT LEADCHNL RV IMPEDANCE VALUE: 475 Ohm
MDC IDC MSMT LEADCHNL RV SENSING INTR AMPL: 10.5 mV
MDC IDC SET LEADCHNL RV PACING AMPLITUDE: 2 V
MDC IDC SET LEADCHNL RV PACING PULSEWIDTH: 0.4 ms
MDC IDC SET LEADCHNL RV SENSING SENSITIVITY: 0.3 mV

## 2018-02-04 ENCOUNTER — Other Ambulatory Visit: Payer: Self-pay | Admitting: Cardiovascular Disease

## 2018-02-25 ENCOUNTER — Ambulatory Visit (INDEPENDENT_AMBULATORY_CARE_PROVIDER_SITE_OTHER): Payer: Medicare HMO

## 2018-02-25 DIAGNOSIS — I428 Other cardiomyopathies: Secondary | ICD-10-CM

## 2018-02-26 NOTE — Progress Notes (Signed)
Remote ICD transmission.   

## 2018-02-28 LAB — CUP PACEART REMOTE DEVICE CHECK
Battery Remaining Longevity: 91 mo
Battery Voltage: 3 V
HighPow Impedance: 44 Ohm
HighPow Impedance: 52 Ohm
Implantable Lead Location: 753860
Implantable Lead Model: 185
Lead Channel Impedance Value: 456 Ohm
Lead Channel Impedance Value: 456 Ohm
Lead Channel Setting Pacing Amplitude: 2 V
Lead Channel Setting Sensing Sensitivity: 0.3 mV
MDC IDC LEAD IMPLANT DT: 20150508
MDC IDC LEAD SERIAL: 194163
MDC IDC MSMT LEADCHNL RV PACING THRESHOLD AMPLITUDE: 0.875 V
MDC IDC MSMT LEADCHNL RV PACING THRESHOLD PULSEWIDTH: 0.4 ms
MDC IDC MSMT LEADCHNL RV SENSING INTR AMPL: 9.75 mV
MDC IDC MSMT LEADCHNL RV SENSING INTR AMPL: 9.75 mV
MDC IDC PG IMPLANT DT: 20150508
MDC IDC SESS DTM: 20200128093726
MDC IDC SET LEADCHNL RV PACING PULSEWIDTH: 0.4 ms
MDC IDC STAT BRADY RV PERCENT PACED: 0 %

## 2018-04-11 ENCOUNTER — Other Ambulatory Visit: Payer: Self-pay | Admitting: Cardiovascular Disease

## 2018-04-23 ENCOUNTER — Telehealth: Payer: Self-pay | Admitting: Cardiovascular Disease

## 2018-04-23 NOTE — Telephone Encounter (Signed)
   Cardiac Questionnaire:    Since your last visit or hospitalization:    1. Have you been having new or worsening chest pain? No   2. Have you been having new or worsening shortness of breath? No 3. Have you been having new or worsening leg swelling, wt gain, or increase in abdominal girth (pants fitting more tightly)? No more than her normal   4. Have you had any passing out spells? No    *A YES to any of these questions would result in the appointment being kept. *If all the answers to these questions are NO, we should indicate that given the current situation regarding the worldwide coronarvirus pandemic, at the recommendation of the CDC, we are looking to limit gatherings in our waiting area, and thus will reschedule their appointment beyond four weeks from today.   _____________   [id number] Pre-Screening Questions:  . Do you currently have a fever? No (yes = cancel and refer to pcp for e-visit) . Have you recently travelled on a cruise, internationally, or to [place], [place], [place], [place], [place], or [place] [employer]) ? No (yes = cancel, stay home, monitor symptoms, and contact pcp or initiate e-visit if symptoms develop) . Have you been in contact with someone that is currently pending confirmation of Covid19 testing or has been confirmed to have the [place] virus?  No (yes = cancel, stay home, away from tested individual, monitor symptoms, and contact pcp or initiate e-visit if symptoms develop) . Are you currently experiencing fatigue or cough? No (yes = pt should be prepared to have a mask placed at the time of their visit).      Patient was calling to follow up on her next office visit which is due July 2020. Reviewed that we would reach out to her for a virtual or office visit closer to that time. She was appreciative for the information with no further questions.

## 2018-04-23 NOTE — Telephone Encounter (Signed)
Patient calling to schedule an overdue f/u Patient's last office visit was 02/15/2017 - needed a 6 month f/u (07/2017) Please advise if patient needs in office visit or evisit

## 2018-05-01 ENCOUNTER — Other Ambulatory Visit: Payer: Self-pay | Admitting: Cardiovascular Disease

## 2018-05-02 ENCOUNTER — Other Ambulatory Visit: Payer: Self-pay | Admitting: Cardiovascular Disease

## 2018-05-27 ENCOUNTER — Other Ambulatory Visit: Payer: Self-pay

## 2018-05-27 ENCOUNTER — Ambulatory Visit (INDEPENDENT_AMBULATORY_CARE_PROVIDER_SITE_OTHER): Payer: Medicare HMO | Admitting: *Deleted

## 2018-05-27 DIAGNOSIS — I5022 Chronic systolic (congestive) heart failure: Secondary | ICD-10-CM

## 2018-05-27 DIAGNOSIS — I428 Other cardiomyopathies: Secondary | ICD-10-CM | POA: Diagnosis not present

## 2018-05-27 LAB — CUP PACEART REMOTE DEVICE CHECK
Battery Remaining Longevity: 88 mo
Battery Voltage: 3 V
Brady Statistic RV Percent Paced: 0.01 %
Date Time Interrogation Session: 20200428052204
HighPow Impedance: 51 Ohm
HighPow Impedance: 63 Ohm
Implantable Lead Implant Date: 20150508
Implantable Lead Location: 753860
Implantable Lead Model: 185
Implantable Lead Serial Number: 194163
Implantable Pulse Generator Implant Date: 20150508
Lead Channel Impedance Value: 513 Ohm
Lead Channel Impedance Value: 532 Ohm
Lead Channel Pacing Threshold Amplitude: 0.75 V
Lead Channel Pacing Threshold Pulse Width: 0.4 ms
Lead Channel Sensing Intrinsic Amplitude: 11.5 mV
Lead Channel Sensing Intrinsic Amplitude: 11.5 mV
Lead Channel Setting Pacing Amplitude: 2 V
Lead Channel Setting Pacing Pulse Width: 0.4 ms
Lead Channel Setting Sensing Sensitivity: 0.3 mV

## 2018-06-05 NOTE — Progress Notes (Signed)
Remote ICD transmission.   

## 2018-06-24 ENCOUNTER — Other Ambulatory Visit: Payer: Self-pay

## 2018-06-24 MED ORDER — SPIRONOLACTONE 25 MG PO TABS: 12.5000 mg | ORAL_TABLET | Freq: Every day | ORAL | 0 refills | Status: DC

## 2018-06-24 NOTE — Telephone Encounter (Signed)
*  STAT* If patient is at the pharmacy, call can be transferred to refill team.   1. Which medications need to be refilled? (please list name of each medication and dose if known) Spironolactone  2. Which pharmacy/location (including street and city if local pharmacy) is medication to be sent to? [facility] [place] 3. Do they need a 30 day or 90 day supply? [place]

## 2018-08-11 ENCOUNTER — Telehealth: Payer: Self-pay

## 2018-08-11 NOTE — Telephone Encounter (Signed)

## 2018-08-14 ENCOUNTER — Other Ambulatory Visit: Payer: Self-pay

## 2018-08-14 ENCOUNTER — Ambulatory Visit (INDEPENDENT_AMBULATORY_CARE_PROVIDER_SITE_OTHER): Payer: Medicare HMO | Admitting: Cardiovascular Disease

## 2018-08-14 ENCOUNTER — Encounter: Payer: Self-pay | Admitting: Cardiovascular Disease

## 2018-08-14 VITALS — BP 112/70 | HR 70 | Temp 98.0°F | Ht 67.0 in | Wt 262.5 lb

## 2018-08-14 DIAGNOSIS — I5022 Chronic systolic (congestive) heart failure: Secondary | ICD-10-CM

## 2018-08-14 DIAGNOSIS — I10 Essential (primary) hypertension: Secondary | ICD-10-CM

## 2018-08-14 MED ORDER — CARVEDILOL 25 MG PO TABS: 12.5000 mg | ORAL_TABLET | Freq: Two times a day (BID) | ORAL | 3 refills | Status: DC

## 2018-08-14 MED ORDER — LOSARTAN POTASSIUM 25 MG PO TABS: 12.5000 mg | ORAL_TABLET | Freq: Every day | ORAL | 3 refills | Status: DC

## 2018-08-14 MED ORDER — SPIRONOLACTONE 25 MG PO TABS: 12.5000 mg | ORAL_TABLET | Freq: Every day | ORAL | 3 refills | Status: DC

## 2018-08-14 NOTE — Patient Instructions (Addendum)
Medication Instructions:  Your physician has recommended you make the following change in your medication:   START Losartan 12.5mg  (1/2 tablet) daily. An Rx has been sent to your pharmacy  If you need a refill on your cardiac medications before your next appointment, please call your pharmacy.   Lab work: Your physician recommends that you return for lab work in:  1 week (bmet)  Please have your labs drawn at the [facility] medical mall. You do not need an appointment. Hours are M-F 8am-4:30pm   If you have labs (blood work) drawn today and your tests are completely normal, you will receive your results only by: Marland Kitchen MyChart Message (if you have MyChart) OR . A paper copy in the mail If you have any lab test that is abnormal or we need to change your treatment, we will call you to review the results.  Testing/Procedures: None ordered  Follow-Up: At [facility], you and your health needs are our priority.  As part of our continuing mission to provide you with exceptional heart care, we have created designated Provider Care Teams.  These Care Teams include your primary Cardiologist (physician) and Advanced Practice Providers (APPs -  Physician Assistants and Nurse Practitioners) who all work together to provide you with the care you need, when you need it. You will need a follow up appointment in 6 months.  Please call our office 2 months in advance to schedule this appointment.  You may see  Dr.Arida  or one of the following Advanced Practice Providers on your designated Care Team:   Murray Hodgkins, NP Christell Faith, PA-C . Marrianne Mood, PA-C

## 2018-08-14 NOTE — Progress Notes (Signed)
Cardiology Office Note   Date:  08/14/2018   ID:  [name], DOB 09/18/1960, MRN [id number]  PCP:  Casilda Carls, MD  Cardiologist:   [name], MD   Chief Complaint  Patient presents with  . other    6 month follow up. Meds reviewed by the pt. verbally. Pt. c/o shortness of breath with over exertion.       History of Present Illness: Lisa Fuentes is a 58 y.o. female who presents for a follow-up visit regarding chronic systolic heart failure due to nonischemic cardiomyopathy. She has known history of chronic systolic heart failure diagnosed in early 2000. She had cardiac catheterization done in 2003 which showed no significant coronary artery disease. She had an ICD placement in 2008 with generator replacement in May 2015 done by Mylinda Latina. She has known history of sleep apnea, hypertension and hyperthyroidism. She received radioactive iodine treatment and currently is on thyroid replacement therapy.  Most recent echocardiogram in January 2019 showed an ejection fraction of 45-50%. She was taken off digoxin in 2016. Most recent nuclear stress test in January 2017  showed no evidence of ischemia with ejection fraction of 49%.  The patient was placed on small dose lisinopril 5 mg in 2016 but developed symptomatic hypotension the medication was stopped.  She has been doing well with no recent chest pain or worsening dyspnea.  She has trace bilateral leg edema.  She takes her medications regularly but sometimes she takes carvedilol once daily instead of twice daily.   Past Medical History:  Diagnosis Date  . Anemia   . Anxiety    panic attacks  . Arthritis   . CHF (congestive heart failure) ([place])   . Clotting disorder ([place])   . Coronary artery disease   . Depression   . Difficult intubation   . Dysrhythmia   . Endometriosis of vagina 09/2013   Intra-operative findings of endometriosis implants on cervical stump  . GERD (gastroesophageal reflux disease)   .  Headache   . Hypercholesteremia   . Hypertension   . Hypokalemia   . Hypothyroidism   . Implantable defibrillator    [employer] DOI 2008, replacement 2015  . Menopausal symptoms   . Nonischemic cardiomyopathy ([facility])    Interval improvement EF 40-45% 1/16  . Shortness of breath dyspnea    chronic doe  . Sleep apnea    Treated with CPAP  . Tachycardia   . Thyroid disease     Past Surgical History:  Procedure Laterality Date  . ANTERIOR AND POSTERIOR REPAIR N/A 11/25/2017   Procedure: ANTERIOR (CYSTOCELE) AND POSTERIOR REPAIR (RECTOCELE);  Surgeon: Rubie Maid, MD;  Location: [facility] ORS;  Service: Gynecology;  Laterality: N/A;  . BREAST CYST ASPIRATION Left 03/22/14   neg/ done by Dr Jamal Collin  . CARDIAC CATHETERIZATION  2003   [facility]: No significant coronary artery disease with reduced ejection fraction.  Lisa Fuentes CARDIAC DEFIBRILLATOR PLACEMENT    . COLONOSCOPY  2015  . COLONOSCOPY WITH PROPOFOL N/A 10/04/2015   Procedure: COLONOSCOPY WITH PROPOFOL;  Surgeon: Lucilla Lame, MD;  Location: [facility] ENDOSCOPY;  Service: Endoscopy;  Laterality: N/A;  . CORONARY ANGIOPLASTY    . CYSTOSCOPY  11/15/2014   Procedure: CYSTOSCOPY;  Surgeon: Rubie Maid, MD;  Location: [facility] ORS;  Service: Gynecology;;  . FOOT SURGERY    . HAND SURGERY    . KNEE ARTHROSCOPY Left 03/02/2015   Procedure: ARTHROSCOPY  LEFT KNEE, PARTIAL LATERAL  MENISECTOMY, SYNOVECTOMY, MEDIAL & LATERAL CHONDROPLASTY;  Surgeon: Thornton Park, MD;  Location: [facility] ORS;  Service: Orthopedics;  Laterality: Left;  Dr. Mack Guise wanted her surgery switched from [facility] because of the Defibrillator.  patient has a Defibrillator  takes Asprin/knows to stop 5days before  has had some test done  . LAPAROSCOPIC SALPINGO OOPHERECTOMY Left 11/15/2014   Procedure: LAPAROSCOPIC OOPHORECTOMY;  Surgeon: Rubie Maid, MD;  Location: [facility] ORS;  Service: Gynecology;  Laterality: Left;  . LAPAROSCOPIC SALPINGOOPHERECTOMY Right 09/2013   also with  left salpingectomy  . TONSILLECTOMY    . TOTAL ABDOMINAL HYSTERECTOMY    . TRACHELECTOMY N/A 11/15/2014   Procedure: TRACHELECTOMY;  Surgeon: Rubie Maid, MD;  Location: [facility] ORS;  Service: Gynecology;  Laterality: N/A;  . TUBAL LIGATION Bilateral      Current Outpatient Medications  Medication Sig Dispense Refill  . aspirin EC 81 MG tablet Take 1 tablet (81 mg total) by mouth daily.    . carvedilol (COREG) 25 MG tablet TAKE ONE-HALF TABLET BY MOUTH TWICE DAILY 90 tablet 0  . cetirizine (ZYRTEC) 10 MG tablet Take 10 mg by mouth daily.    . cyclobenzaprine (FLEXERIL) 10 MG tablet Take 10 mg by mouth 3 (three) times daily as needed for muscle spasms.    Lisa Fuentes esomeprazole (NEXIUM) 40 MG capsule Take 40 mg by mouth every morning.     . furosemide (LASIX) 40 MG tablet Take 40 mg by mouth daily. TAKE ONE TABLET BY MOUTH ONCE DAILY *PATIENT OCCASIONALLY TAKES AN EXTRA TABLET*    . levothyroxine (SYNTHROID, LEVOTHROID) 150 MCG tablet Take 150 mcg by mouth daily before breakfast.     . meloxicam (MOBIC) 15 MG tablet Take 1 tablet (15 mg total) by mouth daily. (Patient taking differently: Take 15 mg by mouth daily as needed for pain. ) 30 tablet 3  . nystatin (MYCOSTATIN) powder Apply 1 Bottle topically 4 (four) times daily as needed (irritaton). Reported on 08/18/2015    . sertraline (ZOLOFT) 50 MG tablet Take 50 mg by mouth daily.    Lisa Fuentes spironolactone (ALDACTONE) 25 MG tablet Take 0.5 tablets (12.5 mg total) by mouth daily. 15 tablet 0  . tiZANidine (ZANAFLEX) 2 MG tablet Take 2 mg by mouth every 6 (six) hours as needed for muscle spasms.     No current facility-administered medications for this visit.     Allergies:   Sulfamethoxazole-trimethoprim    Social History:  The patient  reports that she has never smoked. She has never used smokeless tobacco. She reports current alcohol use. She reports that she does not use drugs.   Family History:  The patient's family history includes Breast cancer  in her sister; Colon cancer in her mother; Hyperlipidemia in her mother; Hypertension in her mother; Ovarian cancer in her sister; Stroke in her mother.    ROS:  Please see the history of present illness.   Otherwise, review of systems are positive for none.   All other systems are reviewed and negative.    PHYSICAL EXAM: VS:  BP 112/70 (BP Location: Right Arm, Patient Position: Sitting, Cuff Size: Large)   Pulse 70   Temp 98 F (36.7 C)   Ht 5\' 7"  (1.702 m)   Wt 262 lb 8 oz (119.1 kg)   BMI 41.11 kg/m  , BMI Body mass index is 41.11 kg/m. GEN: Well nourished, well developed, in no acute distress  HEENT: normal  Neck: no JVD, carotid bruits, or masses Cardiac: RRR; no murmurs, rubs, or gallops, trace edema  Respiratory:  clear to auscultation bilaterally, normal work of breathing GI: soft, nontender, nondistended, + BS MS: no deformity or atrophy  Skin: warm and dry, no rash Neuro:  Strength and sensation are intact Psych: euthymic mood, full affect   EKG:  EKG is ordered today. EKG showed normal sinus rhythm with nonspecific ST and T wave changes.  Moderate LVH    Recent Labs: 11/19/2017: BUN 23; Creatinine, Ser 0.86; Hemoglobin 14.8; Platelets 114; Potassium 4.6; Sodium 140    Lipid Panel No results found for: CHOL, TRIG, HDL, CHOLHDL, VLDL, LDLCALC, LDLDIRECT    Wt Readings from Last 3 Encounters:  08/14/18 262 lb 8 oz (119.1 kg)  01/07/18 264 lb 6.4 oz (119.9 kg)  12/10/17 265 lb 4.8 oz (120.3 kg)       ASSESSMENT AND PLAN:  1.   Chronic systolic heart failure:She is doing well at the present time and continues to be in [place] class II. Continue treatment with carvedilol and spironolactone.   I am going to attempt small dose losartan 12.5 mg once daily.  In the past, she did not tolerate small dose lisinopril due to hypotension.  Check basic metabolic profile in 1 week. She appears to be euvolemic.  2. Status post ICD placement: Followed  by Dr. Caryl Comes  3. Hypothyroidism: Managed by Dr. Dr. Rosario Jacks .  Disposition:   FU with me in 6 months  Signed,  [name], MD  08/14/2018 8:33 AM    [place]

## 2018-08-26 ENCOUNTER — Ambulatory Visit (INDEPENDENT_AMBULATORY_CARE_PROVIDER_SITE_OTHER): Payer: Medicare HMO | Admitting: *Deleted

## 2018-08-26 DIAGNOSIS — I428 Other cardiomyopathies: Secondary | ICD-10-CM | POA: Diagnosis not present

## 2018-08-26 LAB — CUP PACEART REMOTE DEVICE CHECK
Battery Remaining Longevity: 84 mo
Battery Voltage: 3 V
Brady Statistic RV Percent Paced: 0.01 %
Date Time Interrogation Session: 20200728052307
HighPow Impedance: 48 Ohm
HighPow Impedance: 58 Ohm
Implantable Lead Implant Date: 20150508
Implantable Lead Location: 753860
Implantable Lead Model: 185
Implantable Lead Serial Number: 194163
Implantable Pulse Generator Implant Date: 20150508
Lead Channel Impedance Value: 513 Ohm
Lead Channel Impedance Value: 513 Ohm
Lead Channel Pacing Threshold Amplitude: 0.5 V
Lead Channel Pacing Threshold Pulse Width: 0.4 ms
Lead Channel Sensing Intrinsic Amplitude: 12.5 mV
Lead Channel Sensing Intrinsic Amplitude: 12.5 mV
Lead Channel Setting Pacing Amplitude: 2 V
Lead Channel Setting Pacing Pulse Width: 0.4 ms
Lead Channel Setting Sensing Sensitivity: 0.3 mV

## 2018-09-05 ENCOUNTER — Other Ambulatory Visit
Admission: RE | Admit: 2018-09-05 | Discharge: 2018-09-05 | Disposition: A | Discharge status: Home / Self Care | Payer: Medicare HMO | Source: Ambulatory Visit | Attending: Cardiovascular Disease | Admitting: Cardiovascular Disease

## 2018-09-05 DIAGNOSIS — I10 Essential (primary) hypertension: Secondary | ICD-10-CM | POA: Diagnosis present

## 2018-09-05 LAB — BASIC METABOLIC PANEL
Anion gap: 8 (ref 5–15)
BUN: 17 mg/dL (ref 6–20)
CO2: 25 mmol/L (ref 22–32)
Calcium: 9.7 mg/dL (ref 8.9–10.3)
Chloride: 105 mmol/L (ref 98–111)
Creatinine, Ser: 0.7 mg/dL (ref 0.44–1.00)
GFR calc Af Amer: >60 mL/min (ref >60)
GFR calc non Af Amer: >60 mL/min (ref >60)
Glucose, Bld: 164 mg/dL — ABNORMAL HIGH (ref 70–99)
Potassium: 3.7 mmol/L (ref 3.5–5.1)
Sodium: 138 mmol/L (ref 135–145)

## 2018-09-08 ENCOUNTER — Telehealth: Payer: Self-pay | Admitting: Cardiovascular Disease

## 2018-09-08 ENCOUNTER — Encounter: Payer: Self-pay | Admitting: Cardiology

## 2018-09-08 NOTE — Progress Notes (Signed)
Remote ICD transmission.   

## 2018-09-08 NOTE — Telephone Encounter (Signed)
Pt c/o of Chest Pain: STAT if CP now or developed within 24 hours  1. Are you having CP right now? No racing heart   2. Are you experiencing any other symptoms (ex. SOB, nausea, vomiting, sweating)? Palpitations   3. How long have you been experiencing CP? 3-4 days   4. Is your CP continuous or coming and going? Comes and goes   5. Have you taken Nitroglycerin? Asa po q d  ?

## 2018-09-08 NOTE — Telephone Encounter (Signed)
Spoke with patient. States she's been having palpitations 3-4 times a day lasting 3-4 minutes each time. One episode on Saturday she had chest pain and headache. This has NOT reoccurred. Denies dizziness, shortness of breath or any other chest pain.  She has a device. She is on her way home and will send a manual transmission when she gets there. Routing to [employer].

## 2018-09-09 NOTE — Telephone Encounter (Signed)
Spoke w/ pt, did not receive transmission. Pt states that she received an error code. I referred her to [place].

## 2018-09-09 NOTE — Telephone Encounter (Signed)
°  1. Has your device fired? No   2. Is you device beeping? no  3. Are you experiencing draining or swelling at device site? unknown  4. Are you calling to see if we received your device transmission? yes  5. Have you passed out? No  Patient unable to transmit device reading please call to assist    Please route to [place]

## 2018-09-10 ENCOUNTER — Telehealth: Payer: Self-pay

## 2018-09-10 NOTE — Telephone Encounter (Signed)
Per phone note from 09/10/18 by Douglass Rivers, CMA, patient has not yet contacted [employer] tech services but will call in the morning.  Spoke with patient. She reports she feels fine today, reports she thinks the episode had to do with her BP medication, recently started losartan. Advised we will review transmission and call her back once it is received. ED precautions given for any worsening cardiac symptoms. Pt verbalizes understanding and thanked me for my call.

## 2018-09-10 NOTE — Telephone Encounter (Signed)
Pt states she did not call tech services yet but will call in the morning.

## 2018-09-10 NOTE — Telephone Encounter (Signed)
Duplicate phone note--see note opened on 09/08/18.

## 2018-09-12 NOTE — Telephone Encounter (Signed)
Transmission reviewed. No episodes, lead trend stable. 6.9 years longevity. Presenting rhythm VS 90.

## 2018-09-12 NOTE — Telephone Encounter (Signed)
Transmission received 09-12-2018

## 2018-10-22 ENCOUNTER — Other Ambulatory Visit: Payer: Self-pay | Admitting: Internal Medicine

## 2018-10-22 DIAGNOSIS — Z1231 Encounter for screening mammogram for malignant neoplasm of breast: Secondary | ICD-10-CM

## 2018-11-25 ENCOUNTER — Ambulatory Visit (INDEPENDENT_AMBULATORY_CARE_PROVIDER_SITE_OTHER): Payer: Medicare HMO | Admitting: *Deleted

## 2018-11-25 DIAGNOSIS — I420 Dilated cardiomyopathy: Secondary | ICD-10-CM

## 2018-11-25 DIAGNOSIS — I5022 Chronic systolic (congestive) heart failure: Secondary | ICD-10-CM

## 2018-11-27 LAB — CUP PACEART REMOTE DEVICE CHECK
Battery Remaining Longevity: 79 mo
Battery Voltage: 2.99 V
Brady Statistic RV Percent Paced: 0.01 %
Date Time Interrogation Session: 20201029013423
HighPow Impedance: 45 Ohm
HighPow Impedance: 54 Ohm
Implantable Lead Implant Date: 20150508
Implantable Lead Location: 753860
Implantable Lead Model: 185
Implantable Lead Serial Number: 194163
Implantable Pulse Generator Implant Date: 20150508
Lead Channel Impedance Value: 475 Ohm
Lead Channel Impedance Value: 475 Ohm
Lead Channel Pacing Threshold Amplitude: 0.625 V
Lead Channel Pacing Threshold Pulse Width: 0.4 ms
Lead Channel Sensing Intrinsic Amplitude: 9.75 mV
Lead Channel Sensing Intrinsic Amplitude: 9.75 mV
Lead Channel Setting Pacing Amplitude: 2 V
Lead Channel Setting Pacing Pulse Width: 0.4 ms
Lead Channel Setting Sensing Sensitivity: 0.3 mV

## 2018-12-08 ENCOUNTER — Telehealth: Payer: Self-pay | Admitting: Internal Medicine

## 2018-12-08 NOTE — Telephone Encounter (Signed)
LMOM. Returning call .

## 2018-12-08 NOTE — Telephone Encounter (Signed)
°  1. Has your device fired? NO  2. Is you device beeping? NO  3. Are you experiencing draining or swelling at device site? NO  4. Are you calling to see if we received your device transmission? No it was not received per patient not working and she needs a new box for home.  5. Have you passed out? NO     Please route to [place]

## 2018-12-11 NOTE — Telephone Encounter (Signed)
LMOVM

## 2018-12-17 NOTE — Telephone Encounter (Signed)
LMOVM

## 2018-12-18 NOTE — Progress Notes (Signed)
Remote ICD transmission.   

## 2018-12-19 NOTE — Telephone Encounter (Signed)
I spoke with the pt to let her know we did receive a reading from her monitor. I gave her my direct office number and the number to [employer] support so she can get help sending a manual transmission.

## 2018-12-19 NOTE — Telephone Encounter (Signed)
LMOVM

## 2019-01-26 ENCOUNTER — Telehealth: Payer: Self-pay | Admitting: Emergency Medicine

## 2019-01-26 ENCOUNTER — Telehealth: Payer: Self-pay | Admitting: Internal Medicine

## 2019-01-26 ENCOUNTER — Telehealth: Payer: Self-pay | Admitting: Cardiovascular Disease

## 2019-01-26 MED ORDER — LOSARTAN POTASSIUM 25 MG PO TABS: 25.0000 mg | ORAL_TABLET | Freq: Every day | ORAL | Status: DC

## 2019-01-26 NOTE — Telephone Encounter (Signed)
Form faxed and center notified that it has been sent.

## 2019-01-26 NOTE — Telephone Encounter (Signed)
  1. Has your device fired? no  2. Is you device beeping? no  3. Are you experiencing draining or swelling at device site? no  4. Are you calling to see if we received your device transmission?  Late transmission but having issues sending   5. Have you passed out? no    Please route to [place]

## 2019-01-26 NOTE — Telephone Encounter (Signed)
Returned the patients call. Spoke with our office DOD Dr. Garen Lah he is in agreement. The patient should increase Losartan to 25mg  daily.  Spoke with the patient. Patient sts that the holiday season is not a good time of year for her. She has been under a lot of stress and anxiety. Patient sts that she tries to monitor her BP regularly and that she will have an elevated reading once in a while, but not quite as high as the reading she provided below. Advised the patient to increase Losarton to 25mg  qd and to continue to monitor her BP daily and contact the office if her BP is consistently elevated.  Patient verbalized understanding and voiced appreciation for the assistance.

## 2019-01-26 NOTE — Telephone Encounter (Signed)
[facility] returning call to check status of device form completion.  Patient is having R Carpal Tunnel release tomorrow.   Nurse Cell for after hours contact   [phone]

## 2019-01-26 NOTE — Telephone Encounter (Signed)
Pt c/o BP issue: STAT if pt c/o blurred vision, one-sided weakness or slurred speech  1. What are your last 5 BP readings?  178/108 most recent   2. Are you having any other symptoms (ex. Dizziness, headache, blurred vision, passed out)? Headache   3. What is your BP issue? Patient wants to start taking 25 mg of losartan

## 2019-01-26 NOTE — Telephone Encounter (Signed)
Received form for preop clearance at from [place]. Left message requesting return call because no procedure listed on the form.

## 2019-01-26 NOTE — Telephone Encounter (Signed)
Losartan has been increased per patient. States she spoke with nurse to increase medication and will run out soon. Requesting a new Rx for new dosage  *STAT* If patient is at the pharmacy, call can be transferred to refill team.   1. Which medications need to be refilled? (please list name of each medication and dose if known) Losartan 25 mg daily  2. Which pharmacy/location (including street and city if local pharmacy) is medication to be sent to? [place]  3. Do they need a 30 day or 90 day supply? [place]

## 2019-01-27 ENCOUNTER — Other Ambulatory Visit: Payer: Self-pay

## 2019-01-27 MED ORDER — LOSARTAN POTASSIUM 25 MG PO TABS: 25.0000 mg | ORAL_TABLET | Freq: Every day | ORAL | 0 refills | Status: DC

## 2019-01-27 NOTE — Telephone Encounter (Signed)
losartan (COZAAR) 25 MG tablet 90 tablet 0 01/27/2019 04/27/2019   Sig - Route: Take 1 tablet (25 mg total) by mouth daily. - Oral   Sent to pharmacy as: losartan (COZAAR) 25 MG tablet   E-Prescribing Status: Sent to pharmacy (01/27/2019 12:44 PM EST)   Pharmacy  [facility] [place] [facility] [place]

## 2019-01-27 NOTE — Telephone Encounter (Signed)
    [facility] calling again to request form be fully completed and faxed again

## 2019-01-27 NOTE — Telephone Encounter (Addendum)
Follow up    [facility] calling, Please re-fax form to [phone]  The section above signature line was not completed. Patient scheduled for procedure today at 45

## 2019-01-27 NOTE — Telephone Encounter (Signed)
The form is completely filled out and has been faxed 4 times. If they have any questions please direct their call to the device clinic.

## 2019-02-24 ENCOUNTER — Ambulatory Visit (INDEPENDENT_AMBULATORY_CARE_PROVIDER_SITE_OTHER): Payer: Medicare HMO | Admitting: *Deleted

## 2019-02-24 DIAGNOSIS — I5022 Chronic systolic (congestive) heart failure: Secondary | ICD-10-CM | POA: Diagnosis not present

## 2019-02-25 LAB — CUP PACEART REMOTE DEVICE CHECK
Battery Remaining Longevity: 74 mo
Battery Voltage: 2.99 V
Brady Statistic RV Percent Paced: 0.01 %
Date Time Interrogation Session: 20210126063426
HighPow Impedance: 48 Ohm
HighPow Impedance: 58 Ohm
Implantable Lead Implant Date: 20150508
Implantable Lead Location: 753860
Implantable Lead Model: 185
Implantable Lead Serial Number: 194163
Implantable Pulse Generator Implant Date: 20150508
Lead Channel Impedance Value: 513 Ohm
Lead Channel Impedance Value: 532 Ohm
Lead Channel Pacing Threshold Amplitude: 0.625 V
Lead Channel Pacing Threshold Pulse Width: 0.4 ms
Lead Channel Sensing Intrinsic Amplitude: 9.125 mV
Lead Channel Sensing Intrinsic Amplitude: 9.125 mV
Lead Channel Setting Pacing Amplitude: 2 V
Lead Channel Setting Pacing Pulse Width: 0.4 ms
Lead Channel Setting Sensing Sensitivity: 0.3 mV

## 2019-02-26 ENCOUNTER — Ambulatory Visit
Admission: RE | Admit: 2019-02-26 | Discharge: 2019-02-26 | Disposition: A | Discharge status: Home / Self Care | Payer: Medicare HMO | Source: Ambulatory Visit | Attending: Internal Medicine | Admitting: Internal Medicine

## 2019-02-26 ENCOUNTER — Encounter: Payer: Self-pay | Admitting: Physician Assistant

## 2019-02-26 DIAGNOSIS — Z1231 Encounter for screening mammogram for malignant neoplasm of breast: Secondary | ICD-10-CM | POA: Insufficient documentation

## 2019-02-26 NOTE — Progress Notes (Signed)
Cardiology Office Note    Date:  02/27/2019   ID:  Lisa Fuentes, DOB 1960-04-03, MRN [id number]  PCP:  Casilda Carls, MD  Cardiologist:  [name], MD  Electrophysiologist:  Virl Axe, MD   Chief Complaint: Labile hypertension  History of Present Illness:   Lisa Fuentes is a 59 y.o. female with history of HFrEF secondary to NICM status post ICD placement in 2008 with generator replacement in 05/2013 for primary prevention, HTN, HLD, sleep apnea on CPAP, and hyperthyroidism status post radioactive iodine treatment with iatrogenic hypothyroidism on replacement therapy who presents for evaluation of fluctuating blood pressures.  Patient was diagnosed with CHF in early 2000.  LHC in 2003 showed no significant CAD.  She was taken off digoxin in 2016.  She was placed on low-dose lisinopril 5 mg in 2016 though developed symptomatic hypotension leading to medication discontinuation.  Nuclear stress test in 01/2015 showed no evidence of ischemia with an EF of 49%.  Most recent echo from 01/2017 showed an EF of 45 to 50%.  She was most recently seen in the office in 07/2018 and was doing well from a cardiac perspective with trace bilateral lower extremity edema.  She was compliant with medications though sometimes took carvedilol once daily rather than twice daily.  She was started on low-dose losartan 12.5 mg once daily and continued on carvedilol and spironolactone.  She contacted the office in late 12/2018 BP of 178/108.  She indicated she has been under a lot of stress and anxiety.  In this setting, losartan was titrated to 25 mg daily.  Most recent device interrogation from 02/25/2019 showed 1 run of NSVT of 9 beats at 200 bpm.  Patient comes in today noting labile BP over the past 3 to 4 weeks with self-reported readings into the [id number] systolic at home.  In the setting, she was advised to titrate losartan as outlined above.  She also reports a BP in the 80s over 50s prior to injection for  chronic back pain several weeks prior.  She indicates she was asymptomatic with this BP.  She denies any significant changes in her diet though does state she will add salt to her fruit when eating.  She has been taking carvedilol 12.5 mg twice daily, spironolactone 12.5 mg midday, and losartan 12.5 mg twice daily.  Patient's BP in the office today is 116/77.  She indicates she has not yet taken any of her antihypertensives.  With these fluctuating BPs that she has noted some dizziness/lightheadedness.  She initially thought her levothyroxine needed to be adjusted though followed up with her new PCP with her thyroid found to be stable.  She then wondered if her fluctuating BP was in the setting of needing titration of her new CPAP which she acquired approximately 2 to 3 years prior.  She does note that she is now taking naps during the day which is new for her.  Secondly, she does note dyspnea when ambulating into the office today from the parking lot which she typically does not have.  He denies any chest pain or palpitations.  No lower extremity swelling.  She has stable two-pillow orthopnea but the head of the bed slightly elevated secondary to reflux.  She does indicate she falls quite frequently as she typically "scuffs my feet."  No LOC.   Labs independently reviewed: 08/2018 - potassium 3.7, BUN 17, serum creatinine 0.70 10/2017 - Hgb 14.8, PLT 114  Past Medical History:  Diagnosis Date  .  Anemia   . Anxiety    panic attacks  . Arthritis   . Clotting disorder ([place])   . Coronary artery disease   . Depression   . Difficult intubation   . Dysrhythmia   . Endometriosis of vagina 09/2013   Intra-operative findings of endometriosis implants on cervical stump  . GERD (gastroesophageal reflux disease)   . Headache   . Hypercholesteremia   . Hypertension   . Hypokalemia   . Hypothyroidism   . Implantable defibrillator    [employer] DOI 2008, replacement 2015  . Menopausal symptoms   .  Nonischemic cardiomyopathy (HCC)    Interval improvement EF 40-45% 1/16  . Shortness of breath dyspnea    chronic doe  . Sleep apnea    Treated with CPAP  . Tachycardia     Past Surgical History:  Procedure Laterality Date  . ANTERIOR AND POSTERIOR REPAIR N/A 11/25/2017   Procedure: ANTERIOR (CYSTOCELE) AND POSTERIOR REPAIR (RECTOCELE);  Surgeon: Rubie Maid, MD;  Location: [facility] ORS;  Service: Gynecology;  Laterality: N/A;  . BREAST CYST ASPIRATION Left 03/22/14   neg/ done by Dr Jamal Collin  . CARDIAC CATHETERIZATION  2003   [facility]: No significant coronary artery disease with reduced ejection fraction.  Marland Kitchen CARDIAC DEFIBRILLATOR PLACEMENT    . CARPAL TUNNEL RELEASE Right   . COLONOSCOPY  2015  . COLONOSCOPY WITH PROPOFOL N/A 10/04/2015   Procedure: COLONOSCOPY WITH PROPOFOL;  Surgeon: Lucilla Lame, MD;  Location: [facility] ENDOSCOPY;  Service: Endoscopy;  Laterality: N/A;  . CORONARY ANGIOPLASTY    . CYSTOSCOPY  11/15/2014   Procedure: CYSTOSCOPY;  Surgeon: Rubie Maid, MD;  Location: [facility] ORS;  Service: Gynecology;;  . FOOT SURGERY    . HAND SURGERY    . KNEE ARTHROSCOPY Left 03/02/2015   Procedure: ARTHROSCOPY  LEFT KNEE, PARTIAL LATERAL  MENISECTOMY, SYNOVECTOMY, MEDIAL & LATERAL CHONDROPLASTY;  Surgeon: Thornton Park, MD;  Location: [facility] ORS;  Service: Orthopedics;  Laterality: Left;  Dr. Mack Guise wanted her surgery switched from [facility] because of the Defibrillator.  patient has a Defibrillator  takes Asprin/knows to stop 5days before  has had some test done  . LAPAROSCOPIC SALPINGO OOPHERECTOMY Left 11/15/2014   Procedure: LAPAROSCOPIC OOPHORECTOMY;  Surgeon: Rubie Maid, MD;  Location: [facility] ORS;  Service: Gynecology;  Laterality: Left;  . LAPAROSCOPIC SALPINGOOPHERECTOMY Right 09/2013   also with left salpingectomy  . TONSILLECTOMY    . TOTAL ABDOMINAL HYSTERECTOMY    . TRACHELECTOMY N/A 11/15/2014   Procedure: TRACHELECTOMY;  Surgeon: Rubie Maid, MD;  Location: [facility]  ORS;  Service: Gynecology;  Laterality: N/A;  . TUBAL LIGATION Bilateral     Current Medications: Current Meds  Medication Sig  . aspirin EC 81 MG tablet Take 1 tablet (81 mg total) by mouth daily.  . carvedilol (COREG) 25 MG tablet Take 0.5 tablets (12.5 mg total) by mouth 2 (two) times daily.  . cetirizine (ZYRTEC) 10 MG tablet Take 10 mg by mouth daily.  . cyclobenzaprine (FLEXERIL) 10 MG tablet Take 10 mg by mouth 3 (three) times daily as needed for muscle spasms.  Marland Kitchen esomeprazole (NEXIUM) 40 MG capsule Take 40 mg by mouth every morning.   . furosemide (LASIX) 40 MG tablet Take 40 mg by mouth daily. TAKE ONE TABLET BY MOUTH ONCE DAILY *PATIENT OCCASIONALLY TAKES AN EXTRA TABLET*  . levothyroxine (SYNTHROID, LEVOTHROID) 150 MCG tablet Take 150 mcg by mouth daily before breakfast.   . losartan (COZAAR) 25 MG tablet Take 1 tablet (25 mg total)  by mouth daily.  . meloxicam (MOBIC) 15 MG tablet Take 1 tablet (15 mg total) by mouth daily. (Patient taking differently: Take 15 mg by mouth daily as needed for pain. )  . nystatin (MYCOSTATIN) powder Apply 1 Bottle topically 4 (four) times daily as needed (irritaton). Reported on 08/18/2015  . sertraline (ZOLOFT) 50 MG tablet Take 50 mg by mouth daily.  Marland Kitchen spironolactone (ALDACTONE) 25 MG tablet Take 0.5 tablets (12.5 mg total) by mouth daily.  Marland Kitchen tiZANidine (ZANAFLEX) 2 MG tablet Take 2 mg by mouth every 6 (six) hours as needed for muscle spasms.    Allergies:   Sulfamethoxazole-trimethoprim   Social History   Socioeconomic History  . Marital status: Divorced    Spouse name: Not on file  . Number of children: Not on file  . Years of education: Not on file  . Highest education level: Not on file  Occupational History  . Not on file  Tobacco Use  . Smoking status: Never Smoker  . Smokeless tobacco: Never Used  Substance and Sexual Activity  . Alcohol use: Yes    Alcohol/week: 0.0 standard drinks    Comment: 5 q week  . Drug use: No  .  Sexual activity: Not Currently    Birth control/protection: None  Other Topics Concern  . Not on file  Social History Narrative  . Not on file   Social Determinants of Health   Financial Resource Strain:   . Difficulty of Paying Living Expenses: Not on file  Food Insecurity:   . Worried About Charity fundraiser in the Last Year: Not on file  . Ran Out of Food in the Last Year: Not on file  Transportation Needs:   . Lack of Transportation (Medical): Not on file  . Lack of Transportation (Non-Medical): Not on file  Physical Activity:   . Days of Exercise per Week: Not on file  . Minutes of Exercise per Session: Not on file  Stress:   . Feeling of Stress : Not on file  Social Connections:   . Frequency of Communication with Friends and Family: Not on file  . Frequency of Social Gatherings with Friends and Family: Not on file  . Attends Religious Services: Not on file  . Active Member of Clubs or Organizations: Not on file  . Attends Archivist Meetings: Not on file  . Marital Status: Not on file     Family History:  The patient's family history includes Breast cancer (age of onset: 31) in her sister; Colon cancer in her mother; Hyperlipidemia in her mother; Hypertension in her mother; Ovarian cancer in her sister; Stroke in her mother.  ROS:   Review of Systems  Constitutional: Positive for malaise/fatigue. Negative for chills, diaphoresis, fever and weight loss.  HENT: Negative for congestion.   Eyes: Negative for discharge and redness.  Respiratory: Positive for shortness of breath. Negative for cough, sputum production and wheezing.   Cardiovascular: Negative for chest pain, palpitations, orthopnea, claudication, leg swelling and PND.  Gastrointestinal: Negative for abdominal pain, blood in stool, heartburn, melena, nausea and vomiting.  Musculoskeletal: Positive for falls. Negative for myalgias.  Skin: Negative for rash.  Neurological: Positive for dizziness  and weakness. Negative for tingling, tremors, sensory change, speech change, focal weakness, loss of consciousness and headaches.  Psychiatric/Behavioral: Negative for substance abuse. The patient is not nervous/anxious.   All other systems reviewed and are negative.    EKGs/Labs/Other Studies Reviewed:    Studies reviewed were  summarized above. The additional studies were reviewed today:  2D Echo 01/2017: - Left ventricle: The cavity size was mildly dilated. There was   mild concentric hypertrophy. Systolic function was mildly   reduced. The estimated ejection fraction was in the range of 45%   to 50%. Doppler parameters are consistent with abnormal left   ventricular relaxation (grade 1 diastolic dysfunction). - Left atrium: The atrium was mildly dilated. - Pulmonary arteries: Systolic pressure could not be accurately   estimated.   EKG:  EKG is ordered today.  The EKG ordered today demonstrates NSR, 87 bpm nonspecific lateral ST-T changes which appear slightly improved when compared to most recent study  Recent Labs: 09/05/2018: BUN 17; Creatinine, Ser 0.70; Potassium 3.7; Sodium 138  Recent Lipid Panel No results found for: CHOL, TRIG, HDL, CHOLHDL, VLDL, LDLCALC, LDLDIRECT  PHYSICAL EXAM:    VS:  BP 116/77 (BP Location: Left Arm, Patient Position: Sitting, Cuff Size: Large)   Pulse 87   Ht 5\' 7"  (1.702 m)   Wt 257 lb 4 oz (116.7 kg)   SpO2 99%   BMI 40.29 kg/m   BMI: Body mass index is 40.29 kg/m.  Physical Exam  Constitutional: She is oriented to person, place, and time. She appears well-developed and well-nourished.  HENT:  Head: Normocephalic and atraumatic.  Eyes: Right eye exhibits no discharge. Left eye exhibits no discharge.  Neck: No JVD present.  Cardiovascular: Normal rate, regular rhythm, S1 normal, S2 normal and normal heart sounds. Exam reveals no distant heart sounds, no friction rub, no midsystolic click and no opening snap.  No murmur heard. Pulses:       Posterior tibial pulses are 2+ on the right side and 2+ on the left side.  Pulmonary/Chest: Effort normal and breath sounds normal. No respiratory distress. She has no decreased breath sounds. She has no wheezes. She has no rales. She exhibits no tenderness.  Abdominal: Soft. She exhibits no distension. There is no abdominal tenderness.  Musculoskeletal:        General: No edema.     Cervical back: Normal range of motion.  Neurological: She is alert and oriented to person, place, and time.  Skin: Skin is warm and dry. No cyanosis. Nails show no clubbing.  Psychiatric: She has a normal mood and affect. Her speech is normal and behavior is normal. Judgment and thought content normal.    Wt Readings from Last 3 Encounters:  02/27/19 257 lb 4 oz (116.7 kg)  08/14/18 262 lb 8 oz (119.1 kg)  01/07/18 264 lb 6.4 oz (119.9 kg)    Orthostatic vital signs: Lying: 110/72, 88 bpm Sitting: 105/71, 91 bpm Standing: 113/70, 98 bpm Standing x3 minutes: 105/77, 102 bpm  ASSESSMENT & PLAN:   1. HFrEF secondary to NICM with dyspnea: She is euvolemic and well compensated.  Her weight is down 5 pounds when compared to her last in-person visit in 07/2018.  Check echo with recently noted dyspnea earlier today.  Oxygen saturations 99% on room air in the office today.  For now, continue carvedilol 12.5 mg twice daily, losartan 25 mg daily, spironolactone 12.5 mg daily, and Lasix 40 mg daily.  CHF education.  Check BMP and CBC.  2. Status post ICD for primary prevention with NSVT: Patient's continued intermittent dizziness does not seem to correspond with the 1 run of NSVT noted on her most recent device interrogation.  Check echo given NSVT as outlined above.  If she is found to have a worsening  cardiomyopathy we will need to pursue ischemic evaluation.  She will continue on carvedilol.  Prior nonischemic cath.  Followed by EP.  Check BMP as above with recommendation to achieve goal potassium of 4.0.  3. Labile  HTN/dizziness: Blood pressure is well controlled in the office today.  Orthostatics are negative.  Patient has previously been noted to have hypotension leading to the discontinuation of lisinopril.  Some of her reported BP and symptoms are consistent with hypotension.  She was started on low-dose losartan at her last visit.  More recently, she reported some elevated BP readings leading to the titration of losartan to 25 mg daily.  As above, she has been taking losartan 12.5 mg twice daily along with carvedilol, spironolactone, and Lasix.  Of note, she has not yet taken any antihypertensive today with a BP in the office of 116/77.  For now, we have agreed to continue her on her current regimen with the above plan work-up.  She will check her blood pressure 2 hours after taking her medications and bringing them to her next visit.  4. Hypothyroidism: Check TSH.  5. Sleep apnea: Compliant with CPAP.  Managed by PCP.   Disposition: F/u with Dr. Fletcher Anon in 1 month, and EP as directed.   Medication Adjustments/Labs and Tests Ordered: Current medicines are reviewed at length with the patient today.  Concerns regarding medicines are outlined above. Medication changes, Labs and Tests ordered today are summarized above and listed in the Patient Instructions accessible in Encounters.   Signed, Christell Faith, PA-C 02/27/2019 2:25 PM     [address] [place], [place] [address] [phone]

## 2019-02-27 ENCOUNTER — Encounter: Payer: Self-pay | Admitting: Physician Assistant

## 2019-02-27 ENCOUNTER — Other Ambulatory Visit
Admission: RE | Admit: 2019-02-27 | Discharge: 2019-02-27 | Disposition: A | Discharge status: Home / Self Care | Payer: Medicare HMO | Source: Ambulatory Visit | Attending: Physician Assistant | Admitting: Physician Assistant

## 2019-02-27 ENCOUNTER — Telehealth: Payer: Self-pay | Admitting: Physician Assistant

## 2019-02-27 ENCOUNTER — Other Ambulatory Visit: Payer: Self-pay

## 2019-02-27 ENCOUNTER — Ambulatory Visit: Payer: Medicare HMO | Admitting: Physician Assistant

## 2019-02-27 VITALS — BP 116/77 | HR 87 | Ht 67.0 in | Wt 257.2 lb

## 2019-02-27 DIAGNOSIS — I428 Other cardiomyopathies: Secondary | ICD-10-CM

## 2019-02-27 DIAGNOSIS — I472 Ventricular tachycardia: Secondary | ICD-10-CM

## 2019-02-27 DIAGNOSIS — I420 Dilated cardiomyopathy: Secondary | ICD-10-CM | POA: Diagnosis not present

## 2019-02-27 DIAGNOSIS — E039 Hypothyroidism, unspecified: Secondary | ICD-10-CM

## 2019-02-27 DIAGNOSIS — R0989 Other specified symptoms and signs involving the circulatory and respiratory systems: Secondary | ICD-10-CM

## 2019-02-27 DIAGNOSIS — I5022 Chronic systolic (congestive) heart failure: Secondary | ICD-10-CM | POA: Diagnosis not present

## 2019-02-27 DIAGNOSIS — I4729 Other ventricular tachycardia: Secondary | ICD-10-CM

## 2019-02-27 DIAGNOSIS — R42 Dizziness and giddiness: Secondary | ICD-10-CM

## 2019-02-27 DIAGNOSIS — Z95810 Presence of automatic (implantable) cardiac defibrillator: Secondary | ICD-10-CM | POA: Diagnosis not present

## 2019-02-27 DIAGNOSIS — G4730 Sleep apnea, unspecified: Secondary | ICD-10-CM

## 2019-02-27 LAB — CBC
HCT: 43.4 % (ref 36.0–46.0)
Hemoglobin: 13.9 g/dL (ref 12.0–15.0)
MCH: 30 pg (ref 26.0–34.0)
MCHC: 32 g/dL (ref 30.0–36.0)
MCV: 93.5 fL (ref 80.0–100.0)
Platelets: 188 10*3/uL (ref 150–400)
RBC: 4.64 MIL/uL (ref 3.87–5.11)
RDW: 14.2 % (ref 11.5–15.5)
WBC: 7.3 10*3/uL (ref 4.0–10.5)
nRBC: 0 % (ref 0.0–0.2)

## 2019-02-27 LAB — BASIC METABOLIC PANEL
Anion gap: 4 — ABNORMAL LOW (ref 5–15)
BUN: 18 mg/dL (ref 6–20)
CO2: 32 mmol/L (ref 22–32)
Calcium: 9.7 mg/dL (ref 8.9–10.3)
Chloride: 103 mmol/L (ref 98–111)
Creatinine, Ser: 0.98 mg/dL (ref 0.44–1.00)
GFR calc Af Amer: >60 mL/min (ref >60)
GFR calc non Af Amer: >60 mL/min (ref >60)
Glucose, Bld: 85 mg/dL (ref 70–99)
Potassium: 3.9 mmol/L (ref 3.5–5.1)
Sodium: 139 mmol/L (ref 135–145)

## 2019-02-27 LAB — MAGNESIUM: Magnesium: 2.1 mg/dL (ref 1.7–2.4)

## 2019-02-27 LAB — TSH: TSH: 2.335 u[IU]/mL (ref 0.350–4.500)

## 2019-02-27 NOTE — Patient Instructions (Signed)
Medication Instructions:  Your physician recommends that you continue on your current medications as directed. Please refer to the Current Medication list given to you today.  *If you need a refill on your cardiac medications before your next appointment, please call your pharmacy*  Lab Work: Your physician recommends that you return for lab work today at [facility]. (CBC, BMET, Mag, Tsh) No appt is needed. Hours are M-F 7AM- 6 PM.  If you have labs (blood work) drawn today and your tests are completely normal, you will receive your results only by: Marland Kitchen MyChart Message (if you have MyChart) OR . A paper copy in the mail If you have any lab test that is abnormal or we need to change your treatment, we will call you to review the results.  Testing/Procedures: 1- Echo  Please return to [facility] on ______________ at _______________ AM/PM for an Echocardiogram. Your physician has requested that you have an echocardiogram. Echocardiography is a painless test that uses sound waves to create images of your heart. It provides your doctor with information about the size and shape of your heart and how well your heart's chambers and valves are working. This procedure takes approximately one hour. There are no restrictions for this procedure. Please note; depending on visual quality an IV may need to be placed.    Follow-Up: At [facility], you and your health needs are our priority.  As part of our continuing mission to provide you with exceptional heart care, we have created designated Provider Care Teams.  These Care Teams include your primary Cardiologist (physician) and Advanced Practice Providers (APPs -  Physician Assistants and Nurse Practitioners) who all work together to provide you with the care you need, when you need it.  Your next appointment:   1 month(s)  The format for your next appointment:   In Person  Provider:    You may see [name], MD or Christell Faith, PA-C.

## 2019-02-27 NOTE — Telephone Encounter (Signed)
Med list updated. Routing to [place] to make him aware as he saw patient today.

## 2019-02-27 NOTE — Telephone Encounter (Signed)
Patient calling to let office know that Orvan Falconer at [facility] has prescribed patient Oxycodone 5-325. She wanted to make this known regarding her lab work

## 2019-02-27 NOTE — Telephone Encounter (Signed)
Noted.  This would not affect her lab work.  That said, this could play a role in her dizziness/lightheadedness.

## 2019-03-02 ENCOUNTER — Telehealth: Payer: Self-pay

## 2019-03-02 NOTE — Telephone Encounter (Signed)
-----   Message from Rise Mu, [place] sent at 02/27/2019  4:32 PM EST ----- Magnesium at goal. Thyroid function normal. Potassium normal. Kidney function normal. Random glucose normal. Blood count normal.  Overall reassuring labs.

## 2019-03-02 NOTE — Telephone Encounter (Signed)
Call to patient to review lab results.   Pt verbalized understanding and had no further questions at this time.   Advised pt to call for any further questions or concerns.  No further orders at this time.

## 2019-03-18 NOTE — Progress Notes (Signed)
Cardiology Office Note    Date:  03/31/2019   ID:  Lisa Fuentes, DOB 06-04-1960, MRN [id number]  PCP:  Casilda Carls, MD  Cardiologist:  [name], MD  Electrophysiologist:  Virl Axe, MD   Chief Complaint: Follow up  History of Present Illness:   Lisa Fuentes is a 59 y.o. female with history of chronic combined systolic and diastolic CHF with NICM status post ICD for primary prevention in 2008 with generator replacement in 05/2013, HTN, HLD, sleep apnea on CPAP, and hyperthyroidism status post radioactive iodine treatment with iatrogenic hypothyroidism on replacement therapy who presents for follow up of shortness of breath and labile hypertension.  She was diagnosed with CHF in early 2000.  LHC in 2003 showed no significant CAD.  She was taken off digoxin in 2016.  She was placed on low-dose lisinopril 5 mg in 2016 though developed symptomatic hypotension leading to medication discontinuation.  Nuclear stress test in 01/2015 showed no evidence of ischemia with an EF of 49%.  Echo from 01/2017 showed an EF of 45 to 50%.  She was seen in the office in 07/2018 and was doing well from a cardiac perspective with trace bilateral lower extremity edema.  She was compliant with medications though sometimes took carvedilol once daily rather than twice daily.  She was started on low-dose losartan 12.5 mg once daily and continued on carvedilol and spironolactone.  She contacted the office in late 12/2018 BP of 178/108.  She indicated she had been under a lot of stress and anxiety.  In this setting, losartan was titrated to 25 mg daily.  Most recent device interrogation from 02/25/2019 showed 1 run of NSVT of 9 beats at 200 bpm.  She was seen on 02/27/2019 noting labile BP over the prior 3 to 4 weeks.  She was taking carvedilol 12.5 mg twice daily, spironolactone 12.5 mg midday, and losartan 12.5 mg twice daily.  Her BP in the office that day was 116/77, and had not yet taken any of her BP  medications.  With her fluctuating BPs, she had noted some dizziness/lightheadedness, though had recently been prescribed narcotic pain medication as well.  She wondered if her fluctuating BP and daytime sleepiness were in the setting of needing her CPAP titrated.  Orthostatic vitals were normal.  Lastly, she reported dyspnea when ambulating into the office that day from the parking lot.  Labs were stable as below.  Given well controlled BP in the office and recently titrated medications over the phone prior to her visit, no medication changed were undertaken at that time.  Echo was ordered and performed on 03/24/2019, which showed a stable LV systolic function with an EF of 45 to 50%, global hypokinesis, mild LVH, grade 1 diastolic dysfunction, normal RV systolic function and cavity size, trivial mitral regurgitation, borderline dilatation of aortic root measuring 37 mm, and a mildly dilated pulmonary artery.   She comes in today doing reasonably well.  She has noted some stabilization in her BP with most readings at home in the [id number] systolic.  She has not had any significantly elevated BP readings.  She indicates her lowest BP reading is her current reading of 106/70 in our office.  She has not been taking Lasix on a daily basis secondary to cramps associated with this medication when she does take it.  Historically, she has been taking this medication approximately once per week.  She does feel like she is holding onto some excess fluid at this  point.  She did have a [employer] sandwich for lunch today.  Her weight is up 4 pounds today when compared to her visit with [place] on 02/27/2019.  She has stable two-pillow orthopnea.  No dizziness, presyncope, syncope.  No chest pain.  No palpitations.   Labs independently reviewed: 01/2019 - potassium 3.9, BUN 18, SCr 0.98, HGB 13.9, PLT 188, TSH normal, magnesium 2.1  Past Medical History:  Diagnosis Date  . Anemia   . Anxiety    panic attacks  . Arthritis   .  Clotting disorder ([place])   . Coronary artery disease   . Depression   . Difficult intubation   . Dysrhythmia   . Endometriosis of vagina 09/2013   Intra-operative findings of endometriosis implants on cervical stump  . GERD (gastroesophageal reflux disease)   . Headache   . Hypercholesteremia   . Hypertension   . Hypokalemia   . Hypothyroidism   . Implantable defibrillator    [employer] DOI 2008, replacement 2015  . Menopausal symptoms   . Nonischemic cardiomyopathy (HCC)    Interval improvement EF 40-45% 1/16  . Shortness of breath dyspnea    chronic doe  . Sleep apnea    Treated with CPAP  . Tachycardia     Past Surgical History:  Procedure Laterality Date  . ANTERIOR AND POSTERIOR REPAIR N/A 11/25/2017   Procedure: ANTERIOR (CYSTOCELE) AND POSTERIOR REPAIR (RECTOCELE);  Surgeon: Rubie Maid, MD;  Location: [facility] ORS;  Service: Gynecology;  Laterality: N/A;  . BREAST CYST ASPIRATION Left 03/22/14   neg/ done by Dr Jamal Collin  . CARDIAC CATHETERIZATION  2003   [facility]: No significant coronary artery disease with reduced ejection fraction.  Marland Kitchen CARDIAC DEFIBRILLATOR PLACEMENT    . CARPAL TUNNEL RELEASE Right   . COLONOSCOPY  2015  . COLONOSCOPY WITH PROPOFOL N/A 10/04/2015   Procedure: COLONOSCOPY WITH PROPOFOL;  Surgeon: Lucilla Lame, MD;  Location: [facility] ENDOSCOPY;  Service: Endoscopy;  Laterality: N/A;  . CORONARY ANGIOPLASTY    . CYSTOSCOPY  11/15/2014   Procedure: CYSTOSCOPY;  Surgeon: Rubie Maid, MD;  Location: [facility] ORS;  Service: Gynecology;;  . FOOT SURGERY    . HAND SURGERY    . KNEE ARTHROSCOPY Left 03/02/2015   Procedure: ARTHROSCOPY  LEFT KNEE, PARTIAL LATERAL  MENISECTOMY, SYNOVECTOMY, MEDIAL & LATERAL CHONDROPLASTY;  Surgeon: Thornton Park, MD;  Location: [facility] ORS;  Service: Orthopedics;  Laterality: Left;  Dr. Mack Guise wanted her surgery switched from [facility] because of the Defibrillator.  patient has a Defibrillator  takes Asprin/knows to stop 5days  before  has had some test done  . LAPAROSCOPIC SALPINGO OOPHERECTOMY Left 11/15/2014   Procedure: LAPAROSCOPIC OOPHORECTOMY;  Surgeon: Rubie Maid, MD;  Location: [facility] ORS;  Service: Gynecology;  Laterality: Left;  . LAPAROSCOPIC SALPINGOOPHERECTOMY Right 09/2013   also with left salpingectomy  . TONSILLECTOMY    . TOTAL ABDOMINAL HYSTERECTOMY    . TRACHELECTOMY N/A 11/15/2014   Procedure: TRACHELECTOMY;  Surgeon: Rubie Maid, MD;  Location: [facility] ORS;  Service: Gynecology;  Laterality: N/A;  . TUBAL LIGATION Bilateral     Current Medications: Current Meds  Medication Sig  . aspirin EC 81 MG tablet Take 1 tablet (81 mg total) by mouth daily.  . carvedilol (COREG) 25 MG tablet Take 0.5 tablets (12.5 mg total) by mouth 2 (two) times daily.  . cetirizine (ZYRTEC) 10 MG tablet Take 10 mg by mouth daily.  . cyclobenzaprine (FLEXERIL) 10 MG tablet Take 10 mg by mouth 3 (three) times daily  as needed for muscle spasms.  Marland Kitchen esomeprazole (NEXIUM) 40 MG capsule Take 40 mg by mouth every morning.   Marland Kitchen levothyroxine (SYNTHROID, LEVOTHROID) 150 MCG tablet Take 150 mcg by mouth daily before breakfast.   . losartan (COZAAR) 25 MG tablet Take 1 tablet (25 mg total) by mouth daily.  . meloxicam (MOBIC) 15 MG tablet Take 1 tablet (15 mg total) by mouth daily. (Patient taking differently: Take 15 mg by mouth daily as needed for pain. )  . nystatin (MYCOSTATIN) powder Apply 1 Bottle topically 4 (four) times daily as needed (irritaton). Reported on 08/18/2015  . oxyCODONE-acetaminophen (PERCOCET/ROXICET) 5-325 MG tablet Take by mouth every 4 (four) hours as needed for severe pain.  Marland Kitchen sertraline (ZOLOFT) 50 MG tablet Take 50 mg by mouth daily.  Marland Kitchen spironolactone (ALDACTONE) 25 MG tablet Take 0.5 tablets (12.5 mg total) by mouth daily.  Marland Kitchen tiZANidine (ZANAFLEX) 2 MG tablet Take 2 mg by mouth every 6 (six) hours as needed for muscle spasms.    Allergies:   Sulfamethoxazole-trimethoprim   Social History    Socioeconomic History  . Marital status: Divorced    Spouse name: Not on file  . Number of children: Not on file  . Years of education: Not on file  . Highest education level: Not on file  Occupational History  . Not on file  Tobacco Use  . Smoking status: Never Smoker  . Smokeless tobacco: Never Used  Substance and Sexual Activity  . Alcohol use: Yes    Alcohol/week: 0.0 standard drinks    Comment: 5 q week  . Drug use: No  . Sexual activity: Not Currently    Birth control/protection: None  Other Topics Concern  . Not on file  Social History Narrative  . Not on file   Social Determinants of Health   Financial Resource Strain:   . Difficulty of Paying Living Expenses: Not on file  Food Insecurity:   . Worried About Charity fundraiser in the Last Year: Not on file  . Ran Out of Food in the Last Year: Not on file  Transportation Needs:   . Lack of Transportation (Medical): Not on file  . Lack of Transportation (Non-Medical): Not on file  Physical Activity:   . Days of Exercise per Week: Not on file  . Minutes of Exercise per Session: Not on file  Stress:   . Feeling of Stress : Not on file  Social Connections:   . Frequency of Communication with Friends and Family: Not on file  . Frequency of Social Gatherings with Friends and Family: Not on file  . Attends Religious Services: Not on file  . Active Member of Clubs or Organizations: Not on file  . Attends Archivist Meetings: Not on file  . Marital Status: Not on file     Family History:  The patient's family history includes Breast cancer (age of onset: 45) in her sister; Colon cancer in her mother; Hyperlipidemia in her mother; Hypertension in her mother; Ovarian cancer in her sister; Stroke in her mother.  ROS:   Review of Systems  Constitutional: Positive for malaise/fatigue. Negative for chills, diaphoresis, fever and weight loss.  HENT: Negative for congestion.   Eyes: Negative for discharge and  redness.  Respiratory: Positive for shortness of breath. Negative for cough, sputum production and wheezing.   Cardiovascular: Negative for chest pain, palpitations, orthopnea, claudication, leg swelling and PND.  Gastrointestinal: Negative for abdominal pain, heartburn, nausea and vomiting.  Musculoskeletal:  Negative for falls and myalgias.  Skin: Negative for rash.  Neurological: Negative for dizziness, tingling, tremors, sensory change, speech change, focal weakness, loss of consciousness and weakness.  Psychiatric/Behavioral: Negative for substance abuse. The patient is not nervous/anxious.   All other systems reviewed and are negative.    EKGs/Labs/Other Studies Reviewed:    Studies reviewed were summarized above. The additional studies were reviewed today:  2D Echo 01/2017: - Left ventricle: The cavity size was mildly dilated. There was mild concentric hypertrophy. Systolic function was mildly reduced. The estimated ejection fraction was in the range of 45% to 50%. Doppler parameters are consistent with abnormal left ventricular relaxation (grade 1 diastolic dysfunction). - Left atrium: The atrium was mildly dilated. - Pulmonary arteries: Systolic pressure could not be accurately estimated. __________  2D Echo 03/24/2019: 1. Left ventricular ejection fraction, by estimation, is 45 to 50%. The  left ventricle has mildly decreased function. The left ventricle  demonstrates global hypokinesis. The left ventricular internal cavity size  was mildly dilated. There is mild left  ventricular hypertrophy. Left ventricular diastolic parameters are  consistent with Grade I diastolic dysfunction (impaired relaxation).  2. Right ventricular systolic function is normal. The right ventricular  size is normal. Tricuspid regurgitation signal is inadequate for assessing  PA pressure.  3. The mitral valve is normal in structure and function. Trivial mitral  valve regurgitation.    4. The aortic valve is tricuspid. Aortic valve regurgitation is not  visualized. No aortic stenosis is present.  5. Aortic dilatation noted. There is borderline dilatation of the aortic  root measuring 37 mm.  6. Mildly dilated pulmonary artery.  7. The inferior vena cava is normal in size with greater than 50%  respiratory variability, suggesting right atrial pressure of 3 mmHg.    EKG:  EKG is ordered today.  The EKG ordered today demonstrates NSR, 83 bpm, nonspecific ST-T changes  Recent Labs: 02/27/2019: BUN 18; Creatinine, Ser 0.98; Hemoglobin 13.9; Magnesium 2.1; Platelets 188; Potassium 3.9; Sodium 139; TSH 2.335  Recent Lipid Panel No results found for: CHOL, TRIG, HDL, CHOLHDL, VLDL, LDLCALC, LDLDIRECT  PHYSICAL EXAM:    VS:  BP 106/70 (BP Location: Left Arm, Patient Position: Sitting, Cuff Size: Large)   Pulse 83   Ht 5\' 7"  (1.702 m)   Wt 261 lb (118.4 kg)   SpO2 97%   BMI 40.88 kg/m   BMI: Body mass index is 40.88 kg/m.  Physical Exam  Constitutional: She is oriented to person, place, and time. She appears well-developed and well-nourished.  HENT:  Head: Normocephalic and atraumatic.  Eyes: Right eye exhibits no discharge. Left eye exhibits no discharge.  Neck: No JVD present.  Cardiovascular: Normal rate, regular rhythm, S1 normal, S2 normal and normal heart sounds. Exam reveals no distant heart sounds, no friction rub, no midsystolic click and no opening snap.  No murmur heard. Pulses:      Posterior tibial pulses are 2+ on the right side and 2+ on the left side.  Pulmonary/Chest: Effort normal and breath sounds normal. No respiratory distress. She has no decreased breath sounds. She has no wheezes. She has no rales. She exhibits no tenderness.  Abdominal: Soft. She exhibits no distension. There is no abdominal tenderness.  Musculoskeletal:        General: No edema.     Cervical back: Normal range of motion.     Comments: Trace to 1+ bilateral lower  extremity non-pitting edema  Neurological: She is alert and oriented to  person, place, and time.  Skin: Skin is warm and dry. No cyanosis. Nails show no clubbing.  Psychiatric: She has a normal mood and affect. Her speech is normal and behavior is normal. Judgment and thought content normal.    Wt Readings from Last 3 Encounters:  03/31/19 261 lb (118.4 kg)  02/27/19 257 lb 4 oz (116.7 kg)  08/14/18 262 lb 8 oz (119.1 kg)     ASSESSMENT & PLAN:   1. Acute on chronic combined systolic and diastolic CHF with NICM: Her weight is up 4 pounds today when compared to her last clinic visit.  She does appear somewhat volume overloaded with some lower extremity swelling noted on exam.  She did have a [employer] sandwich for lunch earlier today.  She has not been taking Lasix on a daily basis secondary to cramping associated with this medication and historically has been taking this approximately once per week.  We discussed the addition of KCl to her regimen in an effort to prevent cramping.  That said, she is already on losartan and spironolactone and therefore this will need to be monitored closely.  We will check a BMP today with recommendations for KCl supplementation thereafter.  We will again need to check a BMP on 3/8 to ensure stable renal function and potassium.  For now, she will continue carvedilol 12.5 mg twice daily, losartan 12.5 mg twice daily, and spironolactone 12.5 mg daily.  We will look to resume her Lasix 40 mg daily on 3/3 following updated labs.  Recent echo demonstrated stable LV systolic function as outlined above.  2. Status post ICD for primary prevention with NSVT: She denies any device discharges or alarms.  Followed by EP.  3. Labile hypertension/dizziness: Dizziness is improved.  Blood pressure is on the low side today though she is asymptomatic.  Continue current medications as outlined above.  4. Hypothyroidism: Most recent TSH normal.   5. Sleep apnea: Compliant with CPAP.   Managed by PCP.   Disposition: F/u with Dr. Fletcher Anon in 1 month, and EP as directed.   Medication Adjustments/Labs and Tests Ordered: Current medicines are reviewed at length with the patient today.  Concerns regarding medicines are outlined above. Medication changes, Labs and Tests ordered today are summarized above and listed in the Patient Instructions accessible in Encounters.   Signed, Christell Faith, PA-C 03/31/2019 1:32 PM     [facility] [address] [place] [address] [place], [place] [address] [phone]

## 2019-03-24 ENCOUNTER — Ambulatory Visit (INDEPENDENT_AMBULATORY_CARE_PROVIDER_SITE_OTHER): Payer: Medicare HMO

## 2019-03-24 ENCOUNTER — Other Ambulatory Visit: Payer: Self-pay

## 2019-03-24 DIAGNOSIS — I77819 Aortic ectasia, unspecified site: Secondary | ICD-10-CM

## 2019-03-24 DIAGNOSIS — I420 Dilated cardiomyopathy: Secondary | ICD-10-CM | POA: Diagnosis not present

## 2019-03-24 HISTORY — DX: Aortic ectasia, unspecified site: I77.819

## 2019-03-26 ENCOUNTER — Telehealth: Payer: Self-pay

## 2019-03-26 NOTE — Telephone Encounter (Signed)
Incoming call returned by patient. Pt verbalized understanding.   No further questions or orders at this time.   Confirmed upcoming appt next week.  Advised pt to call for any further questions or concerns.

## 2019-03-26 NOTE — Telephone Encounter (Signed)
-----   Message from Rise Mu, PA-C sent at 03/25/2019  7:26 AM EST ----- Echo showed a pump function of 45-50% with mildly dilated, thickened, and stiff heart. Trivially leaky mitral valve. Borderline dilated aortic root dilatation. Mildly dilated pulmonary artery.   Compared to prior echo from 01/2017, her mildly reduced pump function and stiffened heart are stable. Her mildly dilated pulmonary artery is suggestive of elevated right side pressures in the heart, which may be contributing to her dyspnea. Continue current dose of Lasix. Follow up as planned to reassess her symptoms.

## 2019-03-26 NOTE — Telephone Encounter (Signed)
Attempted to call patient. LMTCB 03/26/2019

## 2019-03-31 ENCOUNTER — Encounter: Payer: Self-pay | Admitting: Physician Assistant

## 2019-03-31 ENCOUNTER — Ambulatory Visit: Payer: Medicare HMO | Admitting: Physician Assistant

## 2019-03-31 ENCOUNTER — Other Ambulatory Visit: Payer: Self-pay

## 2019-03-31 VITALS — BP 106/70 | HR 83 | Ht 67.0 in | Wt 261.0 lb

## 2019-03-31 DIAGNOSIS — G4730 Sleep apnea, unspecified: Secondary | ICD-10-CM

## 2019-03-31 DIAGNOSIS — I5043 Acute on chronic combined systolic (congestive) and diastolic (congestive) heart failure: Secondary | ICD-10-CM

## 2019-03-31 DIAGNOSIS — Z95810 Presence of automatic (implantable) cardiac defibrillator: Secondary | ICD-10-CM

## 2019-03-31 DIAGNOSIS — I428 Other cardiomyopathies: Secondary | ICD-10-CM

## 2019-03-31 DIAGNOSIS — R0989 Other specified symptoms and signs involving the circulatory and respiratory systems: Secondary | ICD-10-CM | POA: Diagnosis not present

## 2019-03-31 DIAGNOSIS — E039 Hypothyroidism, unspecified: Secondary | ICD-10-CM

## 2019-03-31 NOTE — Patient Instructions (Signed)
Medication Instructions:   1. Your physician recommends that you continue on your current medications as directed. Please refer to the Current Medication list given to you today.  - we will get your lab work back tomorrow and make changes as needed at that point.   *If you need a refill on your cardiac medications before your next appointment, please call your pharmacy*   Lab Work:  1. Your physician recommends that you have lab work today- BMET 2. Your physician recommends that you return for lab work NEXT Monday at the [facility] - 04/06/2019 for a BMET. No appt is needed. Hours are M-F 7AM- 6 PM.   If you have labs (blood work) drawn today and your tests are completely normal, you will receive your results only by: Marland Kitchen MyChart Message (if you have MyChart) OR . A paper copy in the mail If you have any lab test that is abnormal or we need to change your treatment, we will call you to review the results.   Testing/Procedures:  1. None Ordered   Follow-Up: At [facility], you and your health needs are our priority.  As part of our continuing mission to provide you with exceptional heart care, we have created designated Provider Care Teams.  These Care Teams include your primary Cardiologist (physician) and Advanced Practice Providers (APPs -  Physician Assistants and Nurse Practitioners) who all work together to provide you with the care you need, when you need it.  We recommend signing up for the patient portal called "MyChart".  Sign up information is provided on this After Visit Summary.  MyChart is used to connect with patients for Virtual Visits (Telemedicine).  Patients are able to view lab/test results, encounter notes, upcoming appointments, etc.  Non-urgent messages can be sent to your provider as well.   To learn more about what you can do with MyChart, go to [email or web address].    Your next appointment:   1 month(s)  The format for your next appointment:   In  Person  Provider:   [name], MD

## 2019-04-01 ENCOUNTER — Telehealth: Payer: Self-pay

## 2019-04-01 LAB — BASIC METABOLIC PANEL
BUN/Creatinine Ratio: 13 (ref 9–23)
BUN: 12 mg/dL (ref 6–24)
CO2: 22 mmol/L (ref 20–29)
Calcium: 10 mg/dL (ref 8.7–10.2)
Chloride: 103 mmol/L (ref 96–106)
Creatinine, Ser: 0.9 mg/dL (ref 0.57–1.00)
GFR calc Af Amer: 83 mL/min/{1.73_m2} (ref >59)
GFR calc non Af Amer: 72 mL/min/{1.73_m2} (ref >59)
Glucose: 114 mg/dL — ABNORMAL HIGH (ref 65–99)
Potassium: 4 mmol/L (ref 3.5–5.2)
Sodium: 139 mmol/L (ref 134–144)

## 2019-04-01 MED ORDER — POTASSIUM CHLORIDE ER 10 MEQ PO TBCR: 10.0000 meq | EXTENDED_RELEASE_TABLET | Freq: Every day | ORAL | 3 refills | Status: DC

## 2019-04-01 NOTE — Telephone Encounter (Signed)
-----   Message from Rise Mu, PA-C sent at 04/01/2019  7:21 AM EST ----- Renal function is normal. Potassium is at goal.   Recommendations: -She does have significant cramping when she takes Lasix -Please have patient start KCl 10 mEq daily with Lasix 40 mg daily -She should continue spironolactone and losartan -Recheck BMET on 3/8 as previously recommended to ensure stable renal function and potassium

## 2019-04-01 NOTE — Telephone Encounter (Signed)
Call to patient to review labs. Pt verbalized understanding and is agreeable to POC.   Rx sent to pharmacy as requested.   Confirmed lab order in for repeat.   Advised pt to call for any further questions or concerns.

## 2019-04-10 ENCOUNTER — Emergency Department: Payer: Medicare HMO

## 2019-04-10 ENCOUNTER — Other Ambulatory Visit: Payer: Self-pay

## 2019-04-10 ENCOUNTER — Emergency Department
Admission: EM | Admit: 2019-04-10 | Discharge: 2019-04-10 | Disposition: A | Discharge status: Home / Self Care | Payer: Medicare HMO | Attending: Emergency Medicine | Admitting: Emergency Medicine

## 2019-04-10 DIAGNOSIS — Z7982 Long term (current) use of aspirin: Secondary | ICD-10-CM | POA: Diagnosis not present

## 2019-04-10 DIAGNOSIS — R079 Chest pain, unspecified: Secondary | ICD-10-CM

## 2019-04-10 DIAGNOSIS — R0602 Shortness of breath: Secondary | ICD-10-CM

## 2019-04-10 DIAGNOSIS — I5022 Chronic systolic (congestive) heart failure: Secondary | ICD-10-CM | POA: Insufficient documentation

## 2019-04-10 DIAGNOSIS — Z95810 Presence of automatic (implantable) cardiac defibrillator: Secondary | ICD-10-CM | POA: Diagnosis not present

## 2019-04-10 DIAGNOSIS — E039 Hypothyroidism, unspecified: Secondary | ICD-10-CM | POA: Diagnosis not present

## 2019-04-10 DIAGNOSIS — I2510 Atherosclerotic heart disease of native coronary artery without angina pectoris: Secondary | ICD-10-CM | POA: Diagnosis not present

## 2019-04-10 DIAGNOSIS — I10 Essential (primary) hypertension: Secondary | ICD-10-CM | POA: Insufficient documentation

## 2019-04-10 DIAGNOSIS — K219 Gastro-esophageal reflux disease without esophagitis: Secondary | ICD-10-CM | POA: Diagnosis not present

## 2019-04-10 DIAGNOSIS — R0789 Other chest pain: Secondary | ICD-10-CM | POA: Diagnosis not present

## 2019-04-10 DIAGNOSIS — I110 Hypertensive heart disease with heart failure: Secondary | ICD-10-CM | POA: Insufficient documentation

## 2019-04-10 LAB — CBC
HCT: 44.9 % (ref 36.0–46.0)
Hemoglobin: 14.7 g/dL (ref 12.0–15.0)
MCH: 30.6 pg (ref 26.0–34.0)
MCHC: 32.7 g/dL (ref 30.0–36.0)
MCV: 93.5 fL (ref 80.0–100.0)
Platelets: 158 10*3/uL (ref 150–400)
RBC: 4.8 MIL/uL (ref 3.87–5.11)
RDW: 13.2 % (ref 11.5–15.5)
WBC: 6.8 10*3/uL (ref 4.0–10.5)
nRBC: 0 % (ref 0.0–0.2)

## 2019-04-10 LAB — COMPREHENSIVE METABOLIC PANEL
ALT: 24 U/L (ref 0–44)
AST: 24 U/L (ref 15–41)
Albumin: 4.3 g/dL (ref 3.5–5.0)
Alkaline Phosphatase: 67 U/L (ref 38–126)
Anion gap: 9 (ref 5–15)
BUN: 17 mg/dL (ref 6–20)
CO2: 23 mmol/L (ref 22–32)
Calcium: 9.4 mg/dL (ref 8.9–10.3)
Chloride: 105 mmol/L (ref 98–111)
Creatinine, Ser: 0.92 mg/dL (ref 0.44–1.00)
GFR calc Af Amer: >60 mL/min (ref >60)
GFR calc non Af Amer: >60 mL/min (ref >60)
Glucose, Bld: 145 mg/dL — ABNORMAL HIGH (ref 70–99)
Potassium: 4.6 mmol/L (ref 3.5–5.1)
Sodium: 137 mmol/L (ref 135–145)
Total Bilirubin: 1.2 mg/dL (ref 0.3–1.2)
Total Protein: 7.6 g/dL (ref 6.5–8.1)

## 2019-04-10 LAB — TROPONIN I (HIGH SENSITIVITY)
Troponin I (High Sensitivity): <2 ng/L (ref <18)
Troponin I (High Sensitivity): <2 ng/L (ref <18)

## 2019-04-10 MED ORDER — DIPHENHYDRAMINE HCL 50 MG/ML IJ SOLN
25.0000 mg | Freq: Once | INTRAMUSCULAR | Status: AC
  Administered 2019-04-10: 25 mg via INTRAVENOUS
  Filled 2019-04-10: qty 1

## 2019-04-10 MED ORDER — NITROGLYCERIN 0.4 MG SL SUBL: 0.4000 mg | SUBLINGUAL_TABLET | SUBLINGUAL | Status: DC | PRN

## 2019-04-10 MED ORDER — METOCLOPRAMIDE HCL 10 MG PO TABS: 10.0000 mg | ORAL_TABLET | Freq: Four times a day (QID) | ORAL | 0 refills | Status: DC | PRN

## 2019-04-10 MED ORDER — ALUM & MAG HYDROXIDE-SIMETH 200-200-20 MG/5ML PO SUSP
30.0000 mL | Freq: Once | ORAL | Status: AC
  Administered 2019-04-10: 30 mL via ORAL
  Filled 2019-04-10: qty 30

## 2019-04-10 MED ORDER — ALUMINUM-MAGNESIUM-SIMETHICONE 200-200-20 MG/5ML PO SUSP: 30.0000 mL | Freq: Three times a day (TID) | ORAL | 0 refills | Status: DC

## 2019-04-10 MED ORDER — FAMOTIDINE 20 MG PO TABS
40.0000 mg | ORAL_TABLET | Freq: Once | ORAL | Status: AC
  Administered 2019-04-10: 40 mg via ORAL
  Filled 2019-04-10: qty 2

## 2019-04-10 MED ORDER — METOCLOPRAMIDE HCL 5 MG/ML IJ SOLN
10.0000 mg | Freq: Once | INTRAMUSCULAR | Status: AC
  Administered 2019-04-10: 10 mg via INTRAVENOUS
  Filled 2019-04-10: qty 2

## 2019-04-10 NOTE — ED Triage Notes (Signed)
Pt in with co chest pain since yesterday now radiating to left jaw. Has also been vomiting since then multiple times. EMS gave zofran 4 mg and asa 324mg , IV 20 g to lac.

## 2019-04-10 NOTE — Discharge Instructions (Signed)
Take your Nexium every day to prevent recurrent acid reflux symptoms.  It may also help to take Maalox or Tums if symptoms return.

## 2019-04-10 NOTE — ED Provider Notes (Signed)
[facility] Emergency Department Provider Note  ____________________________________________  Time seen: Approximately 5:12 AM  I have reviewed the triage vital signs and the nursing notes.   HISTORY  Chief Complaint Chest Pain    HPI Lisa Fuentes is a 59 y.o. female with a history of anxiety, GERD, cardiomyopathy who comes the ED complaining of chest pain that started yesterday at about 3:00 PM.  In the evening she started having some episodes of vomiting as well.  Radiates to the neck and throat.  No shortness of breath or diaphoresis.  No aggravating or alleviating factors.  Not exertional, not pleuritic.  She is prescribed Nexium for GERD, but does not take it regularly.  She tried taking her Nexium yesterday without relief.   Currently 3/10 intensity on arrival.  Received aspirin by EMS.     Past Medical History:  Diagnosis Date  . Anemia   . Anxiety    panic attacks  . Arthritis   . Clotting disorder ([place])   . Coronary artery disease   . Depression   . Difficult intubation   . Dysrhythmia   . Endometriosis of vagina 09/2013   Intra-operative findings of endometriosis implants on cervical stump  . GERD (gastroesophageal reflux disease)   . Headache   . Hypercholesteremia   . Hypertension   . Hypokalemia   . Hypothyroidism   . Implantable defibrillator    Medtronic DOI 2008, replacement 2015  . Menopausal symptoms   . Nonischemic cardiomyopathy (HCC)    Interval improvement EF 40-45% 1/16  . Shortness of breath dyspnea    chronic doe  . Sleep apnea    Treated with CPAP  . Tachycardia      Patient Active Problem List   Diagnosis Date Noted  . Chest pain of uncertain etiology [id number]  . Mixed hyperlipidemia 09/10/2016  . Low back pain 03/22/2016  . Family history of malignant neoplasm of gastrointestinal tract   . Dizziness 03/27/2015  . Postoperative state 11/15/2014  . LV dysfunction 10/29/2014  . Knee pain 10/22/2014  .  Endometriosis 09/18/2014  . ICD (implantable cardioverter-defibrillator) in place 02/02/2014  . Chronic systolic heart failure ([place])   . Essential hypertension   . Hypercholesteremia   . Sleep apnea 05/12/2013  . Hyperthyroidism 05/08/2013  . Dilated cardiomyopathy ([place]) 05/08/2013     Past Surgical History:  Procedure Laterality Date  . ANTERIOR AND POSTERIOR REPAIR N/A 11/25/2017   Procedure: ANTERIOR (CYSTOCELE) AND POSTERIOR REPAIR (RECTOCELE);  Surgeon: Rubie Maid, MD;  Location: [facility] ORS;  Service: Gynecology;  Laterality: N/A;  . BREAST CYST ASPIRATION Left 03/22/14   neg/ done by Dr Jamal Collin  . CARDIAC CATHETERIZATION  2003   [facility]: No significant coronary artery disease with reduced ejection fraction.  Marland Kitchen CARDIAC DEFIBRILLATOR PLACEMENT    . CARPAL TUNNEL RELEASE Right   . COLONOSCOPY  2015  . COLONOSCOPY WITH PROPOFOL N/A 10/04/2015   Procedure: COLONOSCOPY WITH PROPOFOL;  Surgeon: Lucilla Lame, MD;  Location: [facility] ENDOSCOPY;  Service: Endoscopy;  Laterality: N/A;  . CORONARY ANGIOPLASTY    . CYSTOSCOPY  11/15/2014   Procedure: CYSTOSCOPY;  Surgeon: Rubie Maid, MD;  Location: [facility] ORS;  Service: Gynecology;;  . FOOT SURGERY    . HAND SURGERY    . KNEE ARTHROSCOPY Left 03/02/2015   Procedure: ARTHROSCOPY  LEFT KNEE, PARTIAL LATERAL  MENISECTOMY, SYNOVECTOMY, MEDIAL & LATERAL CHONDROPLASTY;  Surgeon: Thornton Park, MD;  Location: [facility] ORS;  Service: Orthopedics;  Laterality: Left;  Dr. Mack Guise wanted  her surgery switched from [facility] because of the Defibrillator.  patient has a Defibrillator  takes Asprin/knows to stop 5days before  has had some test done  . LAPAROSCOPIC SALPINGO OOPHERECTOMY Left 11/15/2014   Procedure: LAPAROSCOPIC OOPHORECTOMY;  Surgeon: Rubie Maid, MD;  Location: [facility] ORS;  Service: Gynecology;  Laterality: Left;  . LAPAROSCOPIC SALPINGOOPHERECTOMY Right 09/2013   also with left salpingectomy  . TONSILLECTOMY    . TOTAL ABDOMINAL  HYSTERECTOMY    . TRACHELECTOMY N/A 11/15/2014   Procedure: TRACHELECTOMY;  Surgeon: Rubie Maid, MD;  Location: [facility] ORS;  Service: Gynecology;  Laterality: N/A;  . TUBAL LIGATION Bilateral      Prior to Admission medications   Medication Sig Start Date End Date Taking? Authorizing Provider  aspirin EC 81 MG tablet Take 1 tablet (81 mg total) by mouth daily. 05/22/16  Yes Deboraha Sprang, MD  carvedilol (COREG) 25 MG tablet Take 0.5 tablets (12.5 mg total) by mouth 2 (two) times daily. 08/14/18  Yes Wellington Hampshire, MD  cetirizine (ZYRTEC) 10 MG tablet Take 10 mg by mouth daily.   Yes [provider]  cyclobenzaprine (FLEXERIL) 10 MG tablet Take 10 mg by mouth 3 (three) times daily as needed for muscle spasms.   Yes [provider]  esomeprazole (NEXIUM) 40 MG capsule Take 40 mg by mouth every morning.    Yes [provider]  furosemide (LASIX) 40 MG tablet Take 40 mg by mouth daily. TAKE ONE TABLET BY MOUTH ONCE DAILY *PATIENT OCCASIONALLY TAKES AN EXTRA TABLET* 12/02/13  Yes [provider]  levothyroxine (SYNTHROID, LEVOTHROID) 150 MCG tablet Take 150 mcg by mouth daily before breakfast.    Yes [provider]  losartan (COZAAR) 25 MG tablet Take 1 tablet (25 mg total) by mouth daily. Patient taking differently: Take 12.5 mg by mouth 2 (two) times daily.  01/27/19 04/27/19 Yes Wellington Hampshire, MD  meloxicam (MOBIC) 15 MG tablet Take 1 tablet (15 mg total) by mouth daily. Patient taking differently: Take 15 mg by mouth daily as needed for pain.  03/20/17  Yes Hyatt, Max T, DPM  nystatin (MYCOSTATIN) powder Apply 1 Bottle topically 4 (four) times daily as needed (irritaton). Reported on 08/18/2015   Yes [provider]  oxyCODONE-acetaminophen (PERCOCET/ROXICET) 5-325 MG tablet Take by mouth every 4 (four) hours as needed for severe pain.   Yes [provider]  potassium chloride (KLOR-CON) 10 MEQ tablet Take 1 tablet (10 mEq  total) by mouth daily. 04/01/19 06/30/19 Yes Dunn, Areta Haber, PA-C  sertraline (ZOLOFT) 50 MG tablet Take 50 mg by mouth daily.   Yes [provider]  spironolactone (ALDACTONE) 25 MG tablet Take 0.5 tablets (12.5 mg total) by mouth daily. 08/14/18  Yes Wellington Hampshire, MD  aluminum-magnesium hydroxide-simethicone (MAALOX) [id number] MG/5ML SUSP Take 30 mLs by mouth 4 (four) times daily -  before meals and at bedtime. 04/10/19   Carrie Mew, MD  metoCLOPramide (REGLAN) 10 MG tablet Take 1 tablet (10 mg total) by mouth every 6 (six) hours as needed. 04/10/19   Carrie Mew, MD  predniSONE (DELTASONE) 20 MG tablet Take 40 mg by mouth daily. 04/04/19   [provider]  tiZANidine (ZANAFLEX) 2 MG tablet Take 2 mg by mouth every 6 (six) hours as needed for muscle spasms.    [provider]     Allergies Sulfamethoxazole-trimethoprim   Family History  Problem Relation Age of Onset  . Hypertension Mother   . Hyperlipidemia  Mother   . Stroke Mother   . Colon cancer Mother   . Breast cancer Sister 55  . Ovarian cancer Sister     Social History Social History   Tobacco Use  . Smoking status: Never Smoker  . Smokeless tobacco: Never Used  Substance Use Topics  . Alcohol use: Yes    Alcohol/week: 0.0 standard drinks    Comment: 5 q week  . Drug use: No    Review of Systems  Constitutional:   No fever or chills.  ENT:   No sore throat. No rhinorrhea. Cardiovascular:   Positive chest pain as above without syncope. Respiratory:   No dyspnea or cough. Gastrointestinal:   Negative for abdominal pain, vomiting and diarrhea.  Musculoskeletal:   Negative for focal pain or swelling All other systems reviewed and are negative except as documented above in ROS and HPI.  ____________________________________________   PHYSICAL EXAM:  VITAL SIGNS: ED Triage Vitals [04/10/19 0235]  Enc Vitals Group     BP 114/71     Pulse Rate 93     Resp 20     Temp 98.9 F  (37.2 C)     Temp Source Oral     SpO2 100 %     Weight 257 lb (116.6 kg)     Height 5\' 7"  (1.702 m)     Head Circumference      Peak Flow      Pain Score 7     Pain Loc      Pain Edu?      Excl. in [place]?     Vital signs reviewed, nursing assessments reviewed.   Constitutional:   Alert and oriented. Non-toxic appearance. Eyes:   Conjunctivae are normal. EOMI. PERRL. ENT      Head:   Normocephalic and atraumatic.      Nose:   Wearing a mask.      Mouth/Throat:   Wearing a mask.      Neck:   No meningismus. Full ROM. Hematological/Lymphatic/Immunilogical:   No cervical lymphadenopathy. Cardiovascular:   RRR. Symmetric bilateral radial and DP pulses.  No murmurs. Cap refill less than 2 seconds. Respiratory:   Normal respiratory effort without tachypnea/retractions. Breath sounds are clear and equal bilaterally. No wheezes/rales/rhonchi. Gastrointestinal:   Soft and nontender. Non distended. There is no CVA tenderness.  No rebound, rigidity, or guarding. Musculoskeletal:   Normal range of motion in all extremities. No joint effusions.  No lower extremity tenderness.  No edema. Neurologic:   Normal speech and language.  Motor grossly intact. No acute focal neurologic deficits are appreciated.  Skin:    Skin is warm, dry and intact. No rash noted.  No petechiae, purpura, or bullae.  ____________________________________________    LABS (pertinent positives/negatives) (all labs ordered are listed, but only abnormal results are displayed) Labs Reviewed  COMPREHENSIVE METABOLIC PANEL - Abnormal; Notable for the following components:      Result Value   Glucose, Bld 145 (*)    All other components within normal limits  CBC  TROPONIN I (HIGH SENSITIVITY)  TROPONIN I (HIGH SENSITIVITY)   ____________________________________________   EKG  Interpreted by me Normal sinus rhythm rate of 94, normal axis and intervals.  Normal QRS ST segments.  Slight T wave inversions in V4  through V6, not significantly changed compared to previous EKGs.  ____________________________________________    [id number]  DG Chest 1 View  Result Date: 04/10/2019 CLINICAL DATA:  Pain EXAM: CHEST  1 VIEW COMPARISON:  March 01, 2015 FINDINGS: There is a left-sided ICD in place. There is no pneumothorax. The heart size is stable from prior study. The lung volumes are slightly low. There is no evidence for a focal infiltrate. No large pleural effusion. IMPRESSION: No active disease. Electronically Signed   By: Constance Holster M.D.   On: 04/10/2019 03:07    ____________________________________________   PROCEDURES Procedures  ____________________________________________  DIFFERENTIAL DIAGNOSIS   GERD, non-STEMI, musculoskeletal pain, anxiety  CLINICAL IMPRESSION / ASSESSMENT AND PLAN / ED COURSE  Medications ordered in the ED: Medications  metoCLOPramide (REGLAN) injection 10 mg (10 mg Intravenous Given 04/10/19 0447)  diphenhydrAMINE (BENADRYL) injection 25 mg (25 mg Intravenous Given 04/10/19 0447)  alum & mag hydroxide-simeth (MAALOX/MYLANTA) 200-200-20 MG/5ML suspension 30 mL (30 mLs Oral Given 04/10/19 0447)  famotidine (PEPCID) tablet 40 mg (40 mg Oral Given 04/10/19 0447)    Pertinent labs & imaging results that were available during my care of the patient were reviewed by me and considered in my medical decision making (see chart for details).  Jani Pray Petrow was evaluated in Emergency Department on 04/10/2019 for the symptoms described in the history of present illness. She was evaluated in the context of the global COVID-19 pandemic, which necessitated consideration that the patient might be at risk for infection with the SARS-CoV-2 virus that causes COVID-19. Institutional protocols and algorithms that pertain to the evaluation of patients at risk for COVID-19 are in a state of rapid change based on information released by regulatory bodies including the CDC and  federal and state organizations. These policies and algorithms were followed during the patient's care in the ED.     Clinical Course as of Apr 10 546  Fri Apr 10, 2019  0317 Patient presents with precordial pain, associated with radiation and vomiting.  Vital signs are normal, doubt PE dissection or carditis.  Concern for GERD versus non-STEMI.   EKG shows some lateral T wave inversions which are overall minor in appearance.  Chest x-ray unremarkable.  Will follow up labs, give GI cocktail/antacids to see if this alleviates symptoms.  Recent echocardiogram appears stable from previous.   [PS]  0518 Initial troponin negative, second troponin negative.  Patient reports pain has resolved.  Likely GERD due to PPI noncompliance.  Will give medicines, plan to discharge home to follow-up with primary care.  Counseled the patient that she will need to take her Nexium daily for it to provide best symptom control.   [PS]    Clinical Course User Index [PS] Carrie Mew, MD     ____________________________________________   FINAL CLINICAL IMPRESSION(S) / ED DIAGNOSES    Final diagnoses:  Nonspecific chest pain  Gastroesophageal reflux disease, unspecified whether esophagitis present     ED Discharge Orders         Ordered    metoCLOPramide (REGLAN) 10 MG tablet  Every 6 hours PRN     04/10/19 0547    aluminum-magnesium hydroxide-simethicone (MAALOX) 200-200-20 MG/5ML SUSP  3 times daily before meals & bedtime     04/10/19 0547          Portions of this note were generated with dragon dictation software. Dictation errors may occur despite best attempts at proofreading.   Carrie Mew, MD 04/10/19 [phone]

## 2019-04-10 NOTE — ED Notes (Signed)
pts husband called for discharge home

## 2019-04-13 ENCOUNTER — Telehealth: Payer: Self-pay | Admitting: Cardiovascular Disease

## 2019-04-13 NOTE — Telephone Encounter (Signed)
I believe this is an [place] patient, if not I will be happy to call.

## 2019-04-13 NOTE — Telephone Encounter (Signed)
Spoke with the patient. Patient sts that over the weekend she developed symptoms of blurred vision and DOE associated with chest pressure and jaw pain. Symptoms resolved on their own.  Patient reports a low BP reading today of 91/62. She has only taken her levothyroxine and acid reflux med this morning. Patient sts that with the low BP she felt "off balance." she also had not eaten of drank anything.  I asked her to recheck her BP while I held the line. Pt reports a BP of 113/89. She is currently asymptomatic but sts that she had tingling in her left hand 15 min prior to the call. Pt sts that she has researched the symptoms of a heart attack and wonders if her symptoms will lead to a cardiac event. She was seen in the ED on 04/10/19 for CP.  Adv the patient that I will fwd the message to Christell Faith, PA for his recommendation. Patient is agreeable with the plan.

## 2019-04-13 NOTE — Telephone Encounter (Signed)
Pt c/o BP issue: STAT if pt c/o blurred vision, one-sided weakness or slurred speech  1. What are your last 5 BP readings?  137/96 106/69  91/62 today   2. Are you having any other symptoms (ex. Dizziness, headache, blurred vision, passed out)?  Blurry vision off balance at times sob on exertion chest pain pressure with jaw pain this weekend   3. What is your BP issue? bp seems low now

## 2019-04-13 NOTE — Telephone Encounter (Signed)
Patient made aware of Ignacia Bayley, NP response and recommendation. Patient is agreeable with the plan of care and voiced appreciation for the call.

## 2019-04-13 NOTE — Telephone Encounter (Signed)
ED notes reviewed.  HsTroponins normal at that time, thus making a cardiac cause of her chest pain very unlikely.  ER physicians felt that GI etiology more likely.  Low blood pressure can certainly contribute to unsteadiness and/or tingling sensations.  I see that her bp is sometimes low and at other times, quite high.  In that setting, I rec continuing current medications as is.  During periods of lower blood pressures (99991111 systolic), she may need to increase hydration and rest.  If this is occurring frequently, we may need to reduce losartan dose to 12.5mg  daily. As she did the other day, if she has persistent c/p, dyspnea, lightheadedness, or unexplained tingling/weakness, ER eval would be appropriate.

## 2019-04-21 ENCOUNTER — Ambulatory Visit (INDEPENDENT_AMBULATORY_CARE_PROVIDER_SITE_OTHER): Payer: Medicare HMO | Admitting: Internal Medicine

## 2019-04-21 ENCOUNTER — Encounter: Payer: Self-pay | Admitting: Internal Medicine

## 2019-04-21 ENCOUNTER — Other Ambulatory Visit: Payer: Self-pay

## 2019-04-21 VITALS — BP 110/78 | HR 65 | Ht 67.0 in | Wt 257.0 lb

## 2019-04-21 DIAGNOSIS — I10 Essential (primary) hypertension: Secondary | ICD-10-CM | POA: Diagnosis not present

## 2019-04-21 DIAGNOSIS — I428 Other cardiomyopathies: Secondary | ICD-10-CM | POA: Diagnosis not present

## 2019-04-21 DIAGNOSIS — I472 Ventricular tachycardia, unspecified: Secondary | ICD-10-CM

## 2019-04-21 DIAGNOSIS — I5043 Acute on chronic combined systolic (congestive) and diastolic (congestive) heart failure: Secondary | ICD-10-CM | POA: Diagnosis not present

## 2019-04-21 DIAGNOSIS — Z95810 Presence of automatic (implantable) cardiac defibrillator: Secondary | ICD-10-CM | POA: Diagnosis not present

## 2019-04-21 MED ORDER — METOPROLOL TARTRATE 100 MG PO TABS: ORAL_TABLET | ORAL | 0 refills | Status: DC

## 2019-04-21 NOTE — Progress Notes (Signed)
Patient Care Team: Casilda Carls, MD as PCP - General (Internal Medicine) Wellington Hampshire, MD as PCP - Cardiology (Cardiology) Deboraha Sprang, MD as PCP - Electrophysiology (Cardiology) Casilda Carls, MD (Internal Medicine) Christene Lye, MD (General Surgery) Wellington Hampshire, MD as Consulting Physician (Cardiology)   HPI  Lisa Fuentes is a 59 y.o. female Seen in follow-up for Medtronic ICD implanted for primary prevention for nonischemic cardiomyopathy and congestive heart failure. Done elsewhere.  8, generator replacement 2015  Not seen by EP since 2018.  Seen recently by cardiology with complaints of labile blood pressure  Shortness of breath and fatigue with walking.  No edema.  No nocturnal dyspnea orthopnea; uses CPAP.  Seen in the emergency room for chest pain; high-sensitivity troponins negative x2.  Presumed GI.   Weight is up over the last few years.  Has taken her Covid vaccine     DATE TEST EF   2003 [facility]   No obstructive CAD  1/16 Echo   40-45 %   1/17 MYOVIEW 49% No perfusion defects  1/19 Echo  45-50%   2/21 Echo  45-50%       Date Cr K Hgb  3/21 0.92 4.6 14.7           Past Medical History:  Diagnosis Date  . Anemia   . Anxiety    panic attacks  . Arthritis   . Clotting disorder ([place])   . Coronary artery disease   . Depression   . Difficult intubation   . Dysrhythmia   . Endometriosis of vagina 09/2013   Intra-operative findings of endometriosis implants on cervical stump  . GERD (gastroesophageal reflux disease)   . Headache   . Hypercholesteremia   . Hypertension   . Hypokalemia   . Hypothyroidism   . Implantable defibrillator    Medtronic DOI 2008, replacement 2015  . Menopausal symptoms   . Nonischemic cardiomyopathy (HCC)    Interval improvement EF 40-45% 1/16  . Shortness of breath dyspnea    chronic doe  . Sleep apnea    Treated with CPAP  . Tachycardia     Past Surgical History:  Procedure  Laterality Date  . ANTERIOR AND POSTERIOR REPAIR N/A 11/25/2017   Procedure: ANTERIOR (CYSTOCELE) AND POSTERIOR REPAIR (RECTOCELE);  Surgeon: Rubie Maid, MD;  Location: [facility] ORS;  Service: Gynecology;  Laterality: N/A;  . BREAST CYST ASPIRATION Left 03/22/14   neg/ done by Dr Jamal Collin  . CARDIAC CATHETERIZATION  2003   [facility]: No significant coronary artery disease with reduced ejection fraction.  Marland Kitchen CARDIAC DEFIBRILLATOR PLACEMENT    . CARPAL TUNNEL RELEASE Right   . COLONOSCOPY  2015  . COLONOSCOPY WITH PROPOFOL N/A 10/04/2015   Procedure: COLONOSCOPY WITH PROPOFOL;  Surgeon: Lucilla Lame, MD;  Location: [facility] ENDOSCOPY;  Service: Endoscopy;  Laterality: N/A;  . CORONARY ANGIOPLASTY    . CYSTOSCOPY  11/15/2014   Procedure: CYSTOSCOPY;  Surgeon: Rubie Maid, MD;  Location: [facility] ORS;  Service: Gynecology;;  . FOOT SURGERY    . HAND SURGERY    . KNEE ARTHROSCOPY Left 03/02/2015   Procedure: ARTHROSCOPY  LEFT KNEE, PARTIAL LATERAL  MENISECTOMY, SYNOVECTOMY, MEDIAL & LATERAL CHONDROPLASTY;  Surgeon: Thornton Park, MD;  Location: [facility] ORS;  Service: Orthopedics;  Laterality: Left;  Dr. Mack Guise wanted her surgery switched from [facility] because of the Defibrillator.  patient has a Defibrillator  takes Asprin/knows to stop 5days before  has had some test done  .  LAPAROSCOPIC SALPINGO OOPHERECTOMY Left 11/15/2014   Procedure: LAPAROSCOPIC OOPHORECTOMY;  Surgeon: Rubie Maid, MD;  Location: [facility] ORS;  Service: Gynecology;  Laterality: Left;  . LAPAROSCOPIC SALPINGOOPHERECTOMY Right 09/2013   also with left salpingectomy  . TONSILLECTOMY    . TOTAL ABDOMINAL HYSTERECTOMY    . TRACHELECTOMY N/A 11/15/2014   Procedure: TRACHELECTOMY;  Surgeon: Rubie Maid, MD;  Location: [facility] ORS;  Service: Gynecology;  Laterality: N/A;  . TUBAL LIGATION Bilateral     Current Outpatient Medications  Medication Sig Dispense Refill  . aluminum-magnesium hydroxide-simethicone (MAALOX) [id number] MG/5ML  SUSP Take 30 mLs by mouth 4 (four) times daily -  before meals and at bedtime. 355 mL 0  . aspirin EC 81 MG tablet Take 1 tablet (81 mg total) by mouth daily.    . carvedilol (COREG) 25 MG tablet Take 0.5 tablets (12.5 mg total) by mouth 2 (two) times daily. 90 tablet 3  . cetirizine (ZYRTEC) 10 MG tablet Take 10 mg by mouth daily.    . cyclobenzaprine (FLEXERIL) 10 MG tablet Take 10 mg by mouth 3 (three) times daily as needed for muscle spasms.    Marland Kitchen esomeprazole (NEXIUM) 40 MG capsule Take 40 mg by mouth every morning.     . furosemide (LASIX) 40 MG tablet Take 40 mg by mouth daily. TAKE ONE TABLET BY MOUTH ONCE DAILY *PATIENT OCCASIONALLY TAKES AN EXTRA TABLET*    . levothyroxine (SYNTHROID, LEVOTHROID) 150 MCG tablet Take 150 mcg by mouth daily before breakfast.     . losartan (COZAAR) 25 MG tablet Take 1 tablet (25 mg total) by mouth daily. (Patient taking differently: Take 12.5 mg by mouth 2 (two) times daily. ) 90 tablet 0  . meloxicam (MOBIC) 15 MG tablet Take 1 tablet (15 mg total) by mouth daily. (Patient taking differently: Take 15 mg by mouth daily as needed for pain. ) 30 tablet 3  . nystatin (MYCOSTATIN) powder Apply 1 Bottle topically 4 (four) times daily as needed (irritaton). Reported on 08/18/2015    . oxyCODONE-acetaminophen (PERCOCET/ROXICET) 5-325 MG tablet Take by mouth every 4 (four) hours as needed for severe pain.    . potassium chloride (KLOR-CON) 10 MEQ tablet Take 1 tablet (10 mEq total) by mouth daily. 90 tablet 3  . predniSONE (DELTASONE) 20 MG tablet Take 40 mg by mouth daily.    . sertraline (ZOLOFT) 50 MG tablet Take 50 mg by mouth daily.    Marland Kitchen spironolactone (ALDACTONE) 25 MG tablet Take 0.5 tablets (12.5 mg total) by mouth daily. 45 tablet 3   No current facility-administered medications for this visit.    Allergies  Allergen Reactions  . Sulfamethoxazole-Trimethoprim Hives      Review of Systems negative except from HPI and PMH  Physical Exam BP 110/78  (BP Location: Left Arm, Patient Position: Sitting, Cuff Size: Large)   Pulse 65   Ht 5\' 7"  (1.702 m)   Wt 257 lb (116.6 kg)   SpO2 97%   BMI 40.25 kg/m  Well developed and Morbidly obese  in no acute distress HENT normal Neck supple with JVP-flat Clear Device pocket well healed; without hematoma or erythema.  There is no tethering  Regular rate and rhythm, no   gallop No  murmur Abd-soft with active BS No Clubbing cyanosis   edema Skin-warm and dry A & Oriented  Grossly normal sensory and motor function  ECG sinus at 65 Intervals 14/08/43 Axis normal at 27  Assessment and  Plan  Nonischemic cardiomyopathy with interval  improvement EF most recently 40-45%  Implantable defibrillator-Medtronic  Chest pain  Dyspnea on exertion  Ventricular tachycardia -polymorphic -nonsustained  Hypertension  Morbidly obese  Treated sleep apnea   Blood pressure is well controlled.  Concerning ventricular tachycardia-polymorphic and not long short initiated with normal electrolytes.  With her chest pains, will undertake CTA to exclude coronary disease.  There is mention of an old Myoview about a perfusion defect.  Discussed exercise and importance of weight loss   LV function is stable

## 2019-04-21 NOTE — Patient Instructions (Addendum)
Medication Instructions:  - Your physician recommends that you continue on your current medications as directed. Please refer to the Current Medication list given to you today.  *If you need a refill on your cardiac medications before your next appointment, please call your pharmacy*   Lab Work: - none ordered  If you have labs (blood work) drawn today and your tests are completely normal, you will receive your results only by: Marland Kitchen MyChart Message (if you have MyChart) OR . A paper copy in the mail If you have any lab test that is abnormal or we need to change your treatment, we will call you to review the results.   Testing/Procedures: - Your cardiac CT will be scheduled at one of the below locations:   [facility] [address] [place], [place] [address] (336) [place] [address] [place], [place] [address] [phone]  If scheduled at [facility], please arrive at the [facility] main entrance of [facility] 30 minutes prior to test start time. Proceed to the [facility] Radiology Department (first floor) to check-in and test prep.  If scheduled at [facility], please arrive 15 mins early for check-in and test prep.  Please follow these instructions carefully (unless otherwise directed):  On the Night Before the Test: . Be sure to Drink plenty of water. . Do not consume any caffeinated/decaffeinated beverages or chocolate 12 hours prior to your test. . Do not take any antihistamines 12 hours prior to your test.  On the Day of the Test: . Drink plenty of water. Do not drink any water within one hour of the test. . Do not eat any food 4 hours prior to the test. . You may take your regular medications prior to the test.  . Take metoprolol (Lopressor) 100 mg x 1 dose two hours prior to test. . HOLD Furosemide/Hydrochlorothiazide morning of the  test. . FEMALES- please wear underwire-free bra if available       After the Test: . Drink plenty of water. . After receiving IV contrast, you may experience a mild flushed feeling. This is normal. . On occasion, you may experience a mild rash up to 24 hours after the test. This is not dangerous. If this occurs, you can take Benadryl 25 mg and increase your fluid intake. . If you experience trouble breathing, this can be serious. If it is severe call 911 IMMEDIATELY. If it is mild, please call our office. . If you take any of these medications: Glipizide/Metformin, Avandament, Glucavance, please do not take 48 hours after completing test unless otherwise instructed.   Once we have confirmed authorization from your insurance company, we will call you to set up a date and time for your test.   For non-scheduling related questions, please contact the cardiac imaging nurse navigator should you have any questions/concerns: Marchia Bond, RN Navigator Cardiac Imaging [facility] [phone] office  For scheduling needs, including cancellations and rescheduling, please call [phone].    Follow-Up: At [facility], you and your health needs are our priority.  As part of our continuing mission to provide you with exceptional heart care, we have created designated Provider Care Teams.  These Care Teams include your primary Cardiologist (physician) and Advanced Practice Providers (APPs -  Physician Assistants and Nurse Practitioners) who all work together to provide you with the care you need, when you need it.  We recommend signing up for the patient portal called "MyChart".  Sign up information is provided on this After Visit Summary.  MyChart is used to connect with patients for Virtual Visits (Telemedicine).  Patients are able to view lab/test results, encounter notes, upcoming appointments, etc.  Non-urgent messages can be sent to your provider as well.   To learn  more about what you can do with MyChart, go to [email or web address].    Your next appointment:   1 year(s)  The format for your next appointment:   In Person  Provider:   Virl Axe, MD   Other Instructions N/a   Cardiac CT Angiogram A cardiac CT angiogram is a procedure to look at the heart and the area around the heart. It may be done to help find the cause of chest pains or other symptoms of heart disease. During this procedure, a substance called contrast dye is injected into the blood vessels in the area to be checked. A large X-ray machine, called a CT scanner, then takes detailed pictures of the heart and the surrounding area. The procedure is also sometimes called a coronary CT angiogram, coronary artery scanning, or CTA. A cardiac CT angiogram allows the health care provider to see how well blood is flowing to and from the heart. The health care provider will be able to see if there are any problems, such as:  Blockage or narrowing of the coronary arteries in the heart.  Fluid around the heart.  Signs of weakness or disease in the muscles, valves, and tissues of the heart. Tell a health care provider about:  Any allergies you have. This is especially important if you have had a previous allergic reaction to contrast dye.  All medicines you are taking, including vitamins, herbs, eye drops, creams, and over-the-counter medicines.  Any blood disorders you have.  Any surgeries you have had.  Any medical conditions you have.  Whether you are pregnant or may be pregnant.  Any anxiety disorders, chronic pain, or other conditions you have that may increase your stress or prevent you from lying still. What are the risks? Generally, this is a safe procedure. However, problems may occur, including:  Bleeding.  Infection.  Allergic reactions to medicines or dyes.  Damage to other structures or organs.  Kidney damage from the contrast dye that is used.  Increased  risk of cancer from radiation exposure. This risk is low. Talk with your health care provider about: ? The risks and benefits of testing. ? How you can receive the lowest dose of radiation. What happens before the procedure?  Wear comfortable clothing and remove any jewelry, glasses, dentures, and hearing aids.  Follow instructions from your health care provider about eating and drinking. This may include: ? For 12 hours before the procedure -- avoid caffeine. This includes tea, coffee, soda, energy drinks, and diet pills. Drink plenty of water or other fluids that do not have caffeine in them. Being well hydrated can prevent complications. ? For 4-6 hours before the procedure -- stop eating and drinking. The contrast dye can cause nausea, but this is less likely if your stomach is empty.  Ask your health care provider about changing or stopping your regular medicines. This is especially important if you are taking diabetes medicines, blood thinners, or medicines to treat problems with erections (erectile dysfunction). What happens during the procedure?   Hair on your chest may need to be removed so that small sticky patches called electrodes can be placed  on your chest. These will transmit information that helps to monitor your heart during the procedure.  An IV will be inserted into one of your veins.  You might be given a medicine to control your heart rate during the procedure. This will help to ensure that good images are obtained.  You will be asked to lie on an exam table. This table will slide in and out of the CT machine during the procedure.  Contrast dye will be injected into the IV. You might feel warm, or you may get a metallic taste in your mouth.  You will be given a medicine called nitroglycerin. This will relax or dilate the arteries in your heart.  The table that you are lying on will move into the CT machine tunnel for the scan.  The person running the machine will  give you instructions while the scans are being done. You may be asked to: ? Keep your arms above your head. ? Hold your breath. ? Stay very still, even if the table is moving.  When the scanning is complete, you will be moved out of the machine.  The IV will be removed. The procedure may vary among health care providers and hospitals. What can I expect after the procedure? After your procedure, it is common to have:  A metallic taste in your mouth from the contrast dye.  A feeling of warmth.  A headache from the nitroglycerin. Follow these instructions at home:  Take over-the-counter and prescription medicines only as told by your health care provider.  If you are told, drink enough fluid to keep your urine pale yellow. This will help to flush the contrast dye out of your body.  Most people can return to their normal activities right after the procedure. Ask your health care provider what activities are safe for you.  It is up to you to get the results of your procedure. Ask your health care provider, or the department that is doing the procedure, when your results will be ready.  Keep all follow-up visits as told by your health care provider. This is important. Contact a health care provider if:  You have any symptoms of allergy to the contrast dye. These include: ? Shortness of breath. ? Rash or hives. ? A racing heartbeat. Summary  A cardiac CT angiogram is a procedure to look at the heart and the area around the heart. It may be done to help find the cause of chest pains or other symptoms of heart disease.  During this procedure, a large X-ray machine, called a CT scanner, takes detailed pictures of the heart and the surrounding area after a contrast dye has been injected into blood vessels in the area.  Ask your health care provider about changing or stopping your regular medicines before the procedure. This is especially important if you are taking diabetes medicines,  blood thinners, or medicines to treat erectile dysfunction.  If you are told, drink enough fluid to keep your urine pale yellow. This will help to flush the contrast dye out of your body. This information is not intended to replace advice given to you by your health care provider. Make sure you discuss any questions you have with your health care provider. Document Revised: 09/10/2018 Document Reviewed: 09/10/2018 Elsevier Patient Education  [place].

## 2019-04-22 LAB — CUP PACEART INCLINIC DEVICE CHECK
Battery Remaining Longevity: 75 mo
Battery Voltage: 2.96 V
Brady Statistic RV Percent Paced: 0.01 %
Date Time Interrogation Session: 20210323111526
HighPow Impedance: 52 Ohm
HighPow Impedance: 60 Ohm
Implantable Lead Implant Date: 20150508
Implantable Lead Location: 753860
Implantable Lead Model: 185
Implantable Lead Serial Number: 194163
Implantable Pulse Generator Implant Date: 20150508
Lead Channel Impedance Value: 475 Ohm
Lead Channel Impedance Value: 475 Ohm
Lead Channel Pacing Threshold Amplitude: 0.75 V
Lead Channel Pacing Threshold Pulse Width: 0.4 ms
Lead Channel Sensing Intrinsic Amplitude: 10.25 mV
Lead Channel Setting Pacing Amplitude: 2 V
Lead Channel Setting Pacing Pulse Width: 0.4 ms
Lead Channel Setting Sensing Sensitivity: 0.3 mV

## 2019-04-29 ENCOUNTER — Telehealth: Payer: Self-pay | Admitting: Cardiovascular Disease

## 2019-04-29 NOTE — Telephone Encounter (Signed)
Patient calling  Patient wanting to check the status of scheduling CT that was ordered by Dr Caryl Comes  Patient would like to know if she should keep appointment with R Dunn on 4/5 or if she should wait til after she has CT results Please call to discuss

## 2019-04-29 NOTE — Telephone Encounter (Signed)
Spoke with patient and she wanted to know if she still needed to come see Lisa Faith PA-C since her CT has not been done yet. Advised that I would send message to him to see if he is agreeable with this and will also let Dr. Olin Fuentes nurse know that she has still not heard from scheduling for test as well. I did let her know that it can take up to a few weeks for it to be done but would let her know of this. She was appreciative for the call back with no further questions at this time.

## 2019-04-30 NOTE — Telephone Encounter (Signed)
A staff message was sent to Precert and Jannet Askew on 04/22/19 to advise the order for the patient's Cardiac CTA had been placed.  I was not aware of the follow up appointment with Thurmond Butts, [place] on 05/04/19 when she checked out with Dr. Caryl Comes on 3/23.  Will forward to scheduling to please reschedule the patient's follow up appointment for ~ 3-4 weeks out with Thurmond Butts to allow time for her Cardiac CT to be authorized and done.

## 2019-04-30 NOTE — Telephone Encounter (Signed)
Spoke with patient ct pending and will call to schedule visit when ct is scheduled.

## 2019-04-30 NOTE — Telephone Encounter (Signed)
Please reschedule for after her coronary CTA.

## 2019-05-04 ENCOUNTER — Ambulatory Visit: Payer: Medicare HMO | Admitting: Physician Assistant

## 2019-05-06 NOTE — Telephone Encounter (Signed)
Patient calling in to get her CT scheduled. Patient states she has been waiting for a call but wants to get this done  Please advise when able

## 2019-05-21 ENCOUNTER — Telehealth (HOSPITAL_COMMUNITY): Payer: Self-pay | Admitting: Emergency Medicine

## 2019-05-21 NOTE — Telephone Encounter (Signed)
Reaching out to patient to offer assistance regarding upcoming cardiac imaging study; pt verbalizes understanding of appt date/time, parking situation and where to check in, pre-test NPO status and medications ordered, and verified current allergies; name and call back number provided for further questions should they arise Hawa Henly RN Navigator Cardiac Imaging [facility] [phone] office [phone] cell 

## 2019-05-22 ENCOUNTER — Other Ambulatory Visit: Payer: Self-pay

## 2019-05-22 ENCOUNTER — Ambulatory Visit (HOSPITAL_COMMUNITY)
Admission: RE | Admit: 2019-05-22 | Discharge: 2019-05-22 | Disposition: A | Discharge status: Home / Self Care | Payer: Medicare HMO | Source: Ambulatory Visit | Attending: Internal Medicine | Admitting: Internal Medicine

## 2019-05-22 DIAGNOSIS — I472 Ventricular tachycardia, unspecified: Secondary | ICD-10-CM

## 2019-05-22 DIAGNOSIS — R079 Chest pain, unspecified: Secondary | ICD-10-CM

## 2019-05-22 MED ORDER — NITROGLYCERIN 0.4 MG SL SUBL
0.8000 mg | SUBLINGUAL_TABLET | Freq: Once | SUBLINGUAL | Status: AC
  Administered 2019-05-22: 0.8 mg via SUBLINGUAL

## 2019-05-22 MED ORDER — NITROGLYCERIN 0.4 MG SL SUBL
SUBLINGUAL_TABLET | SUBLINGUAL | Status: AC
  Filled 2019-05-22: qty 2

## 2019-05-26 ENCOUNTER — Ambulatory Visit (INDEPENDENT_AMBULATORY_CARE_PROVIDER_SITE_OTHER): Payer: Medicare HMO | Admitting: *Deleted

## 2019-05-26 DIAGNOSIS — I5022 Chronic systolic (congestive) heart failure: Secondary | ICD-10-CM | POA: Diagnosis not present

## 2019-05-26 LAB — CUP PACEART REMOTE DEVICE CHECK
Battery Remaining Longevity: 68 mo
Battery Voltage: 2.99 V
Brady Statistic RV Percent Paced: 0.01 %
Date Time Interrogation Session: 20210427031606
HighPow Impedance: 49 Ohm
HighPow Impedance: 56 Ohm
Implantable Lead Implant Date: 20150508
Implantable Lead Location: 753860
Implantable Lead Model: 185
Implantable Lead Serial Number: 194163
Implantable Pulse Generator Implant Date: 20150508
Lead Channel Impedance Value: 475 Ohm
Lead Channel Impedance Value: 475 Ohm
Lead Channel Pacing Threshold Amplitude: 0.625 V
Lead Channel Pacing Threshold Pulse Width: 0.4 ms
Lead Channel Sensing Intrinsic Amplitude: 10.125 mV
Lead Channel Sensing Intrinsic Amplitude: 10.125 mV
Lead Channel Setting Pacing Amplitude: 2 V
Lead Channel Setting Pacing Pulse Width: 0.4 ms
Lead Channel Setting Sensing Sensitivity: 0.3 mV

## 2019-05-27 ENCOUNTER — Ambulatory Visit: Payer: Medicare HMO | Admitting: Podiatry

## 2019-05-27 ENCOUNTER — Ambulatory Visit (INDEPENDENT_AMBULATORY_CARE_PROVIDER_SITE_OTHER): Payer: Medicare HMO

## 2019-05-27 ENCOUNTER — Other Ambulatory Visit: Payer: Self-pay

## 2019-05-27 ENCOUNTER — Encounter: Payer: Self-pay | Admitting: Podiatry

## 2019-05-27 ENCOUNTER — Other Ambulatory Visit: Payer: Self-pay | Admitting: Podiatry

## 2019-05-27 DIAGNOSIS — M2142 Flat foot [pes planus] (acquired), left foot: Secondary | ICD-10-CM

## 2019-05-27 DIAGNOSIS — F419 Anxiety disorder, unspecified: Secondary | ICD-10-CM | POA: Insufficient documentation

## 2019-05-27 DIAGNOSIS — I5020 Unspecified systolic (congestive) heart failure: Secondary | ICD-10-CM | POA: Insufficient documentation

## 2019-05-27 DIAGNOSIS — F329 Major depressive disorder, single episode, unspecified: Secondary | ICD-10-CM | POA: Insufficient documentation

## 2019-05-27 DIAGNOSIS — M2141 Flat foot [pes planus] (acquired), right foot: Secondary | ICD-10-CM

## 2019-05-27 DIAGNOSIS — M778 Other enthesopathies, not elsewhere classified: Secondary | ICD-10-CM

## 2019-05-27 DIAGNOSIS — G894 Chronic pain syndrome: Secondary | ICD-10-CM | POA: Insufficient documentation

## 2019-05-27 DIAGNOSIS — F32A Depression, unspecified: Secondary | ICD-10-CM | POA: Insufficient documentation

## 2019-05-27 NOTE — Progress Notes (Signed)
She presents today after having not seen her for couple years states my feet look like they are curving in she says they do not hurt me or anything she states that she is noticed a seem to be curving particularly with shoes you can really tell the difference.  She says it looks like it is getting worse.  Objective: Vital signs are stable she is alert and oriented x3.  Pulses are palpable.  No erythema cellulitis drainage odor mild edema about the feet.  Muscle strength is normal orthopedic evaluation demonstrates flexible pes planus bilateral with no complications.  History of bunion repair is bilateral.  Assessment: Pes planus.  Plan: After looking at feet and then looking at her shoes she is putting a straight foot in a curved soft shoe which gives it the appearance that the foot is curve curving in when actually her foot is over walking the lateral aspect of the shoe itself.  So we discussed appropriate shoe gear today she understands and is amenable to will follow up with me as needed

## 2019-05-27 NOTE — Progress Notes (Signed)
ICD Remote  

## 2019-05-29 NOTE — Progress Notes (Signed)
Cardiology Office Note    Date:  06/04/2019   ID:  Lisa Fuentes, DOB 1960/06/18, MRN [id number]  PCP:  Casilda Carls, MD  Cardiologist:  [name], MD  Electrophysiologist:  Virl Axe, MD   Chief Complaint: Follow-up  History of Present Illness:   Lisa Fuentes is a 59 y.o. female with history of chronic combined systolic and diastolic CHF with NICMstatus post ICD for primary prevention in 2008 with generator replacement in 05/2013, HTN, HLD, sleep apnea on CPAP, and hyperthyroidism status post radioactive iodine treatment with iatrogenic hypothyroidism on replacement therapy who presents forfollow up of  recent coronary CTA.  She was diagnosed with CHF in early 2000. LHC in 2003 showed no significant CAD. She was taken off digoxin in 2016. She was placed on low-dose lisinopril 5 mg in 2016 though developed symptomatic hypotension leading to medication discontinuation. Nuclear stress test in 01/2015 showed no evidence of ischemia with an EF of 49%. Echo from 1/2019showed an EF of 45 to 50%. She was seen in the office in 07/2018 and was doing well from a cardiac perspective with trace bilateral lower extremity edema. She was compliant with medications though sometimes took carvedilol once daily rather than twice daily. She was started on low-dose losartan 12.5 mg once daily and continued on carvedilol and spironolactone. She contacted the office in late 12/2018 BP of 178/108. She indicated she had been under a lot of stress and anxiety. In this setting,losartan was titrated to 25 mg daily.  She was seen on 02/27/2019 noting labile BP over the prior 3 to 4 weeks. She was taking carvedilol 12.5 mg twice daily, spironolactone 12.5 mg midday, and losartan 12.5 mg twice daily. Her BP in the office that day was 116/77, and had not yet taken any of her BP medications. With her fluctuating BPs, she had noted some dizziness/lightheadedness, though had recently been prescribed  narcotic pain medication as well. She wondered if her fluctuating BP and daytime sleepiness were in the setting of needing her CPAP titrated.  Orthostatic vitals were normal.  Lastly, she reported dyspnea when ambulating into the office that day from the parking lot.  Labs were stable as below.  Given well controlled BP in the office and recently titrated medications over the phone prior to her visit, no medication changed were undertaken at that time.  Echo was ordered and performed on 03/24/2019, which showed a stable LV systolic function with an EF of 45 to 50%, global hypokinesis, mild LVH, grade 1 diastolic dysfunction, normal RV systolic function and cavity size, trivial mitral regurgitation, borderline dilatation of aortic root measuring 37 mm, and a mildly dilated pulmonary artery.   She was seen in follow-up on 03/31/2019 and doing well with most of her BP readings at home in the [id number] systolic.  Her weight was up 4 pounds when compared to her prior clinic visit.  She was started on Lasix 40 mg daily.  She was seen in the ED on 04/10/2019 with chest pain.  High-sensitivity troponin negative x2.  Chest x-ray nonacute.  EKG showed sinus rhythm with lateral T wave inversion.  Symptoms were presumed to be GI in etiology.  She followed up with EP on 04/21/2019 for follow-up of recent device interrogation which showed 7 episodes of NSVT with the longest being 2 seconds in duration appearing both monomorphic and polymorphic.  In the setting of normal electrolytes, she underwent coronary CTA on 05/22/2019 which showed a calcium score of 0 and no  obstructive CAD.    She comes in doing well from a cardiac perspective.  She has stable chronic dyspnea which is most noticeable when she is wearing 2 masks going out in the public.  No chest pain, palpitations, dizziness, presyncope, syncope.  Stable bilateral ankle swelling.  Tolerating all medications without issues.  At baseline, she is sedentary and spends most of her time  in bed with her dog.  She does not have any issues or concerns at this time.   Labs independently reviewed: 03/2019 - potassium 4.6, BUN 17, serum creatinine 0.92, albumin 4.3, AST/ALT normal, Hgb 14.7, PLT 158 01/2019 - TSH normal, magnesium 2.1  Past Medical History:  Diagnosis Date  . Anemia   . Anxiety    panic attacks  . Arthritis   . Clotting disorder ([place])   . Coronary artery disease   . Depression   . Difficult intubation   . Dysrhythmia   . Endometriosis of vagina 09/2013   Intra-operative findings of endometriosis implants on cervical stump  . GERD (gastroesophageal reflux disease)   . Headache   . Hypercholesteremia   . Hypertension   . Hypokalemia   . Hypothyroidism   . Implantable defibrillator    [employer] DOI 2008, replacement 2015  . Menopausal symptoms   . Nonischemic cardiomyopathy (HCC)    Interval improvement EF 40-45% 1/16  . Shortness of breath dyspnea    chronic doe  . Sleep apnea    Treated with CPAP  . Tachycardia     Past Surgical History:  Procedure Laterality Date  . ANTERIOR AND POSTERIOR REPAIR N/A 11/25/2017   Procedure: ANTERIOR (CYSTOCELE) AND POSTERIOR REPAIR (RECTOCELE);  Surgeon: Rubie Maid, MD;  Location: [facility] ORS;  Service: Gynecology;  Laterality: N/A;  . BREAST CYST ASPIRATION Left 03/22/14   neg/ done by Dr Jamal Collin  . CARDIAC CATHETERIZATION  2003   [facility]: No significant coronary artery disease with reduced ejection fraction.  Marland Kitchen CARDIAC DEFIBRILLATOR PLACEMENT    . CARPAL TUNNEL RELEASE Right   . COLONOSCOPY  2015  . COLONOSCOPY WITH PROPOFOL N/A 10/04/2015   Procedure: COLONOSCOPY WITH PROPOFOL;  Surgeon: Lucilla Lame, MD;  Location: [facility] ENDOSCOPY;  Service: Endoscopy;  Laterality: N/A;  . CORONARY ANGIOPLASTY    . CYSTOSCOPY  11/15/2014   Procedure: CYSTOSCOPY;  Surgeon: Rubie Maid, MD;  Location: [facility] ORS;  Service: Gynecology;;  . FOOT SURGERY    . HAND SURGERY    . KNEE ARTHROSCOPY Left 03/02/2015   Procedure:  ARTHROSCOPY  LEFT KNEE, PARTIAL LATERAL  MENISECTOMY, SYNOVECTOMY, MEDIAL & LATERAL CHONDROPLASTY;  Surgeon: Thornton Park, MD;  Location: [facility] ORS;  Service: Orthopedics;  Laterality: Left;  Dr. Mack Guise wanted her surgery switched from [place] because of the Defibrillator.  patient has a Defibrillator  takes Asprin/knows to stop 5days before  has had some test done  . LAPAROSCOPIC SALPINGO OOPHERECTOMY Left 11/15/2014   Procedure: LAPAROSCOPIC OOPHORECTOMY;  Surgeon: Rubie Maid, MD;  Location: [facility] ORS;  Service: Gynecology;  Laterality: Left;  . LAPAROSCOPIC SALPINGOOPHERECTOMY Right 09/2013   also with left salpingectomy  . TONSILLECTOMY    . TOTAL ABDOMINAL HYSTERECTOMY    . TRACHELECTOMY N/A 11/15/2014   Procedure: TRACHELECTOMY;  Surgeon: Rubie Maid, MD;  Location: [facility] ORS;  Service: Gynecology;  Laterality: N/A;  . TUBAL LIGATION Bilateral     Current Medications: Current Meds  Medication Sig  . aspirin EC 81 MG tablet Take 1 tablet (81 mg total) by mouth daily.  . carvedilol (COREG)  25 MG tablet Take 0.5 tablets (12.5 mg total) by mouth 2 (two) times daily.  . cetirizine (ZYRTEC) 10 MG tablet Take 10 mg by mouth daily.  . cyclobenzaprine (FLEXERIL) 10 MG tablet Take 10 mg by mouth 3 (three) times daily as needed for muscle spasms.  Marland Kitchen esomeprazole (NEXIUM) 40 MG capsule Take 40 mg by mouth every morning.   . furosemide (LASIX) 40 MG tablet Take 40 mg by mouth daily. TAKE ONE TABLET BY MOUTH ONCE DAILY *PATIENT OCCASIONALLY TAKES AN EXTRA TABLET*  . gabapentin (NEURONTIN) 100 MG capsule   . levothyroxine (SYNTHROID, LEVOTHROID) 150 MCG tablet Take 150 mcg by mouth daily before breakfast.   . nystatin (MYCOSTATIN) powder Apply 1 Bottle topically 4 (four) times daily as needed (irritaton). Reported on 08/18/2015  . oxyCODONE-acetaminophen (PERCOCET/ROXICET) 5-325 MG tablet Take by mouth every 4 (four) hours as needed for severe pain.  . potassium chloride  (KLOR-CON) 10 MEQ tablet Take 1 tablet (10 mEq total) by mouth daily.  . sertraline (ZOLOFT) 50 MG tablet Take 50 mg by mouth daily.  Marland Kitchen spironolactone (ALDACTONE) 25 MG tablet Take 0.5 tablets (12.5 mg total) by mouth daily.    Allergies:   Sulfamethoxazole-trimethoprim   Social History   Socioeconomic History  . Marital status: Divorced    Spouse name: Not on file  . Number of children: Not on file  . Years of education: Not on file  . Highest education level: Not on file  Occupational History  . Not on file  Tobacco Use  . Smoking status: Never Smoker  . Smokeless tobacco: Never Used  Substance and Sexual Activity  . Alcohol use: Yes    Alcohol/week: 0.0 standard drinks    Comment: 5 q week  . Drug use: No  . Sexual activity: Not Currently    Birth control/protection: None  Other Topics Concern  . Not on file  Social History Narrative  . Not on file   Social Determinants of Health   Financial Resource Strain:   . Difficulty of Paying Living Expenses:   Food Insecurity:   . Worried About Charity fundraiser in the Last Year:   . Arboriculturist in the Last Year:   Transportation Needs:   . Film/video editor (Medical):   Marland Kitchen Lack of Transportation (Non-Medical):   Physical Activity:   . Days of Exercise per Week:   . Minutes of Exercise per Session:   Stress:   . Feeling of Stress :   Social Connections:   . Frequency of Communication with Friends and Family:   . Frequency of Social Gatherings with Friends and Family:   . Attends Religious Services:   . Active Member of Clubs or Organizations:   . Attends Archivist Meetings:   Marland Kitchen Marital Status:      Family History:  The patient's family history includes Breast cancer (age of onset: 58) in her sister; Colon cancer in her mother; Hyperlipidemia in her mother; Hypertension in her mother; Ovarian cancer in her sister; Stroke in her mother.  ROS:   Review of Systems  Constitutional: Positive for  malaise/fatigue. Negative for chills, diaphoresis, fever and weight loss.  HENT: Negative for congestion.   Eyes: Negative for discharge and redness.  Respiratory: Positive for shortness of breath. Negative for cough, sputum production and wheezing.   Cardiovascular: Positive for leg swelling. Negative for chest pain, palpitations, orthopnea, claudication and PND.  Gastrointestinal: Negative for abdominal pain, heartburn, nausea and vomiting.  Musculoskeletal: Negative for falls and myalgias.  Skin: Negative for rash.  Neurological: Positive for weakness. Negative for dizziness, tingling, tremors, sensory change, speech change, focal weakness and loss of consciousness.  Endo/Heme/Allergies: Does not bruise/bleed easily.  Psychiatric/Behavioral: Negative for substance abuse. The patient is not nervous/anxious.   All other systems reviewed and are negative.    EKGs/Labs/Other Studies Reviewed:    Studies reviewed were summarized above. The additional studies were reviewed today:  2D Echo 01/2017: - Left ventricle: The cavity size was mildly dilated. There was mild concentric hypertrophy. Systolic function was mildly reduced. The estimated ejection fraction was in the range of 45% to 50%. Doppler parameters are consistent with abnormal left ventricular relaxation (grade 1 diastolic dysfunction). - Left atrium: The atrium was mildly dilated. - Pulmonary arteries: Systolic pressure could not be accurately estimated. __________  2D Echo 03/24/2019: 1. Left ventricular ejection fraction, by estimation, is 45 to 50%. The  left ventricle has mildly decreased function. The left ventricle  demonstrates global hypokinesis. The left ventricular internal cavity size  was mildly dilated. There is mild left  ventricular hypertrophy. Left ventricular diastolic parameters are  consistent with Grade I diastolic dysfunction (impaired relaxation).  2. Right ventricular systolic function is  normal. The right ventricular  size is normal. Tricuspid regurgitation signal is inadequate for assessing  PA pressure.  3. The mitral valve is normal in structure and function. Trivial mitral  valve regurgitation.  4. The aortic valve is tricuspid. Aortic valve regurgitation is not  visualized. No aortic stenosis is present.  5. Aortic dilatation noted. There is borderline dilatation of the aortic  root measuring 37 mm.  6. Mildly dilated pulmonary artery.  7. The inferior vena cava is normal in size with greater than 50%  respiratory variability, suggesting right atrial pressure of 3 mmHg.   EKG:  EKG is ordered today.  The EKG ordered today demonstrates NSR, 99 bpm, baseline artifact, nonspecific ST-T changes  Recent Labs: 02/27/2019: Magnesium 2.1; TSH 2.335 04/10/2019: ALT 24; BUN 17; Creatinine, Ser 0.92; Hemoglobin 14.7; Platelets 158; Potassium 4.6; Sodium 137  Recent Lipid Panel No results found for: CHOL, TRIG, HDL, CHOLHDL, VLDL, LDLCALC, LDLDIRECT  PHYSICAL EXAM:    VS:  BP 130/78 (BP Location: Right Arm, Patient Position: Sitting, Cuff Size: Normal)   Pulse 99   Ht 5\' 7"  (1.702 m)   Wt 255 lb 6 oz (115.8 kg)   SpO2 97%   BMI 40.00 kg/m   BMI: Body mass index is 40 kg/m.  Physical Exam  Constitutional: She is oriented to person, place, and time. She appears well-developed and well-nourished.  HENT:  Head: Normocephalic and atraumatic.  Eyes: Right eye exhibits no discharge. Left eye exhibits no discharge.  Neck: No JVD present.  Cardiovascular: Normal rate, regular rhythm, S1 normal, S2 normal and normal heart sounds. Exam reveals no distant heart sounds, no friction rub, no midsystolic click and no opening snap.  No murmur heard. Pulses:      Posterior tibial pulses are 2+ on the right side and 2+ on the left side.  Pulmonary/Chest: Effort normal and breath sounds normal. No respiratory distress. She has no decreased breath sounds. She has no wheezes. She  has no rales. She exhibits no tenderness.  Abdominal: Soft. She exhibits no distension. There is no abdominal tenderness.  Musculoskeletal:        General: No edema.     Cervical back: Normal range of motion.  Neurological: She is alert and oriented  to person, place, and time.  Skin: Skin is warm and dry. No cyanosis. Nails show no clubbing.  Psychiatric: She has a normal mood and affect. Her speech is normal and behavior is normal. Judgment and thought content normal.    Wt Readings from Last 3 Encounters:  06/04/19 255 lb 6 oz (115.8 kg)  04/21/19 257 lb (116.6 kg)  04/10/19 257 lb (116.6 kg)     ASSESSMENT & PLAN:   1. Chronic combined systolic and diastolic CHF secondary to NICM with chronic dyspnea: She is euvolemic and well compensated.  I suspect a fair amount of her chronic dyspnea is secondary to morbid obesity with physical deconditioning.  Discussed referral to cardiopulmonary rehab, this was declined.  She feels like the majority of her symptoms are related to her underlying sleep apnea and she wants to discuss this further with her PCP prior to considering rehab.  Continue current GDMT including carvedilol, furosemide, losartan, and spironolactone.  Relative hypotension has precluded escalation of medical therapy.  CHF education discussed in detail.  2. NSVT: Status post ICD for primary prevention.  Coronary CTA without obstructive coronary artery disease as outlined above.  Case discussed with EP with no further recommendations at this time.  Recent potassium and magnesium at goal.  Continue current medical regimen and follow-up with EP as directed.  3. HTN: Blood pressure well controlled.  Continue current medical therapy as outlined above.  4. Sleep apnea: Compliant with CPAP.  Managed by PCP.  5. Hypothyroidism: Recent TSH normal.  Remains on placement therapy.  Followed by PCP.  Disposition: F/u with Dr. Fletcher Anon or an APP in 6 months, sooner if needed, and EP as  directed.   Medication Adjustments/Labs and Tests Ordered: Current medicines are reviewed at length with the patient today.  Concerns regarding medicines are outlined above. Medication changes, Labs and Tests ordered today are summarized above and listed in the Patient Instructions accessible in Encounters.   Signed, Christell Faith, PA-C 06/04/2019 1:29 PM     [address] [place], [place] [id number] [phone]

## 2019-06-04 ENCOUNTER — Other Ambulatory Visit: Payer: Self-pay

## 2019-06-04 ENCOUNTER — Ambulatory Visit: Payer: Medicare HMO | Admitting: Physician Assistant

## 2019-06-04 ENCOUNTER — Encounter: Payer: Self-pay | Admitting: Physician Assistant

## 2019-06-04 VITALS — BP 130/78 | HR 99 | Ht 67.0 in | Wt 255.4 lb

## 2019-06-04 DIAGNOSIS — I4729 Other ventricular tachycardia: Secondary | ICD-10-CM

## 2019-06-04 DIAGNOSIS — I428 Other cardiomyopathies: Secondary | ICD-10-CM | POA: Diagnosis not present

## 2019-06-04 DIAGNOSIS — Z95810 Presence of automatic (implantable) cardiac defibrillator: Secondary | ICD-10-CM | POA: Diagnosis not present

## 2019-06-04 DIAGNOSIS — E039 Hypothyroidism, unspecified: Secondary | ICD-10-CM

## 2019-06-04 DIAGNOSIS — I10 Essential (primary) hypertension: Secondary | ICD-10-CM

## 2019-06-04 DIAGNOSIS — G4730 Sleep apnea, unspecified: Secondary | ICD-10-CM

## 2019-06-04 DIAGNOSIS — I5022 Chronic systolic (congestive) heart failure: Secondary | ICD-10-CM

## 2019-06-04 DIAGNOSIS — R0609 Other forms of dyspnea: Secondary | ICD-10-CM

## 2019-06-04 DIAGNOSIS — I472 Ventricular tachycardia: Secondary | ICD-10-CM

## 2019-06-04 NOTE — Patient Instructions (Signed)
Medication Instructions:  Your physician recommends that you continue on your current medications as directed. Please refer to the Current Medication list given to you today.  *If you need a refill on your cardiac medications before your next appointment, please call your pharmacy*   Lab Work: None ordered If you have labs (blood work) drawn today and your tests are completely normal, you will receive your results only by: Marland Kitchen MyChart Message (if you have MyChart) OR . A paper copy in the mail If you have any lab test that is abnormal or we need to change your treatment, we will call you to review the results.   Testing/Procedures: None ordered    Follow-Up: At [facility], you and your health needs are our priority.  As part of our continuing mission to provide you with exceptional heart care, we have created designated Provider Care Teams.  These Care Teams include your primary Cardiologist (physician) and Advanced Practice Providers (APPs -  Physician Assistants and Nurse Practitioners) who all work together to provide you with the care you need, when you need it.  We recommend signing up for the patient portal called "MyChart".  Sign up information is provided on this After Visit Summary.  MyChart is used to connect with patients for Virtual Visits (Telemedicine).  Patients are able to view lab/test results, encounter notes, upcoming appointments, etc.  Non-urgent messages can be sent to your provider as well.   To learn more about what you can do with MyChart, go to [email or web address].    Your next appointment:   6 month(s)  The format for your next appointment:   In Person  Provider:    You may see [name], MD or Christell Faith, PA-C

## 2019-08-10 ENCOUNTER — Other Ambulatory Visit (HOSPITAL_COMMUNITY): Payer: Self-pay | Admitting: Otolaryngology

## 2019-08-10 ENCOUNTER — Other Ambulatory Visit: Payer: Self-pay | Admitting: Otolaryngology

## 2019-08-10 DIAGNOSIS — E041 Nontoxic single thyroid nodule: Secondary | ICD-10-CM

## 2019-08-12 ENCOUNTER — Other Ambulatory Visit: Payer: Self-pay | Admitting: Cardiovascular Disease

## 2019-08-14 ENCOUNTER — Ambulatory Visit: Payer: Medicare HMO

## 2019-08-14 ENCOUNTER — Other Ambulatory Visit: Payer: Self-pay

## 2019-08-14 ENCOUNTER — Ambulatory Visit
Admission: RE | Admit: 2019-08-14 | Discharge: 2019-08-14 | Disposition: A | Discharge status: Home / Self Care | Payer: Medicare HMO | Source: Ambulatory Visit | Attending: Otolaryngology | Admitting: Otolaryngology

## 2019-08-14 DIAGNOSIS — E041 Nontoxic single thyroid nodule: Secondary | ICD-10-CM | POA: Insufficient documentation

## 2019-08-21 ENCOUNTER — Other Ambulatory Visit: Payer: Self-pay | Admitting: Cardiovascular Disease

## 2019-08-25 ENCOUNTER — Ambulatory Visit (INDEPENDENT_AMBULATORY_CARE_PROVIDER_SITE_OTHER): Payer: Medicare HMO | Admitting: *Deleted

## 2019-08-25 DIAGNOSIS — I420 Dilated cardiomyopathy: Secondary | ICD-10-CM

## 2019-08-26 LAB — CUP PACEART REMOTE DEVICE CHECK
Battery Remaining Longevity: 64 mo
Battery Voltage: 2.99 V
Brady Statistic RV Percent Paced: 0.01 %
Date Time Interrogation Session: 20210727063523
HighPow Impedance: 49 Ohm
HighPow Impedance: 57 Ohm
Implantable Lead Implant Date: 20150508
Implantable Lead Location: 753860
Implantable Lead Model: 185
Implantable Lead Serial Number: 194163
Implantable Pulse Generator Implant Date: 20150508
Lead Channel Impedance Value: 475 Ohm
Lead Channel Impedance Value: 475 Ohm
Lead Channel Pacing Threshold Amplitude: 0.875 V
Lead Channel Pacing Threshold Pulse Width: 0.4 ms
Lead Channel Sensing Intrinsic Amplitude: 12.5 mV
Lead Channel Sensing Intrinsic Amplitude: 12.5 mV
Lead Channel Setting Pacing Amplitude: 2 V
Lead Channel Setting Pacing Pulse Width: 0.4 ms
Lead Channel Setting Sensing Sensitivity: 0.3 mV

## 2019-08-31 NOTE — Progress Notes (Signed)
Remote ICD transmission.   

## 2019-09-21 ENCOUNTER — Ambulatory Visit: Payer: Self-pay | Attending: Internal Medicine

## 2019-09-21 DIAGNOSIS — Z23 Encounter for immunization: Secondary | ICD-10-CM

## 2019-09-21 NOTE — Progress Notes (Signed)
   Covid-19 Vaccination Clinic  Name:  Lisa Fuentes    MRN: [id number] DOB: [birth year]  09/21/2019  [name] was observed post Covid-19 immunization for 15 minutes without incident. She was provided with Vaccine Information Sheet and instruction to access the V-Safe system.   [name] was instructed to call 911 with any severe reactions post vaccine: Marland Kitchen Difficulty breathing  . Swelling of face and throat  . A fast heartbeat  . A bad rash all over body  . Dizziness and weakness

## 2019-10-26 ENCOUNTER — Other Ambulatory Visit: Payer: Self-pay | Admitting: Cardiovascular Disease

## 2019-11-24 ENCOUNTER — Ambulatory Visit (INDEPENDENT_AMBULATORY_CARE_PROVIDER_SITE_OTHER): Payer: Medicare HMO

## 2019-11-24 DIAGNOSIS — I420 Dilated cardiomyopathy: Secondary | ICD-10-CM

## 2019-11-24 LAB — CUP PACEART REMOTE DEVICE CHECK
Battery Remaining Longevity: 64 mo
Battery Voltage: 2.99 V
Brady Statistic RV Percent Paced: 0.01 %
Date Time Interrogation Session: 20211026043723
HighPow Impedance: 51 Ohm
HighPow Impedance: 57 Ohm
Implantable Lead Implant Date: 20150508
Implantable Lead Location: 753860
Implantable Lead Model: 185
Implantable Lead Serial Number: 194163
Implantable Pulse Generator Implant Date: 20150508
Lead Channel Impedance Value: 475 Ohm
Lead Channel Impedance Value: 475 Ohm
Lead Channel Pacing Threshold Amplitude: 0.75 V
Lead Channel Pacing Threshold Pulse Width: 0.4 ms
Lead Channel Sensing Intrinsic Amplitude: 13 mV
Lead Channel Sensing Intrinsic Amplitude: 13 mV
Lead Channel Setting Pacing Amplitude: 2 V
Lead Channel Setting Pacing Pulse Width: 0.4 ms
Lead Channel Setting Sensing Sensitivity: 0.3 mV

## 2019-11-30 NOTE — Progress Notes (Signed)
Remote ICD transmission.   

## 2019-12-08 ENCOUNTER — Ambulatory Visit: Payer: Medicare HMO | Admitting: Cardiovascular Disease

## 2019-12-08 ENCOUNTER — Encounter: Payer: Self-pay | Admitting: Cardiovascular Disease

## 2019-12-08 ENCOUNTER — Other Ambulatory Visit: Payer: Self-pay

## 2019-12-08 VITALS — BP 120/76 | HR 73 | Ht 67.0 in | Wt 259.5 lb

## 2019-12-08 DIAGNOSIS — I472 Ventricular tachycardia: Secondary | ICD-10-CM | POA: Diagnosis not present

## 2019-12-08 DIAGNOSIS — I5022 Chronic systolic (congestive) heart failure: Secondary | ICD-10-CM

## 2019-12-08 DIAGNOSIS — I4729 Other ventricular tachycardia: Secondary | ICD-10-CM

## 2019-12-08 NOTE — Patient Instructions (Signed)

## 2019-12-08 NOTE — Progress Notes (Signed)
Cardiology Office Note   Date:  12/08/2019   ID:  Lisa Fuentes, DOB [birth year], MRN [id number]  PCP:  Maryland Pink, MD  Cardiologist:   [name], MD   Chief Complaint  Patient presents with  . other    6 month follow up. Meds reviewed by the pt. verbally. Pt. c/o LE edema and is under a lot of stress due two sibilings having cancer with her sisiter passing June with ovarian cancer and her brother has been given less than a week to live due to throat cancer.       History of Present Illness: Lisa Fuentes is a 59 y.o. female who presents for a follow-up visit regarding chronic systolic heart failure due to nonischemic cardiomyopathy. She has known history of chronic systolic heart failure diagnosed in early 2000. She had cardiac catheterization done in 2003 which showed no significant coronary artery disease. She had an ICD placement in 2008 with generator replacement in May 2015. She has known history of sleep apnea, hypertension and hyperthyroidism. She received radioactive iodine treatment and currently is on thyroid replacement therapy. Most recent nuclear stress test in January 2017  showed no evidence of ischemia with ejection fraction of 49%. Most recent echocardiogram in February of this year showed an EF of 45 to 50%. She underwent cardiac CTA in April of this year which showed calcium score of 0 with no evidence of coronary artery disease.  She has been doing reasonably well with no chest pain.  She reports stable exertional dyspnea.  No orthopnea or PND.  She is under significant stress as her brother has advanced throat cancer and currently staying with her under hospice care.  Past Medical History:  Diagnosis Date  . Anemia   . Anxiety    panic attacks  . Arthritis   . Clotting disorder ([place])   . Coronary artery disease   . Depression   . Difficult intubation   . Dysrhythmia   . Endometriosis of vagina 09/2013   Intra-operative findings of  endometriosis implants on cervical stump  . GERD (gastroesophageal reflux disease)   . Headache   . Hypercholesteremia   . Hypertension   . Hypokalemia   . Hypothyroidism   . Implantable defibrillator    [employer] DOI 2008, replacement 2015  . Menopausal symptoms   . Nonischemic cardiomyopathy (HCC)    Interval improvement EF 40-45% 1/16  . Shortness of breath dyspnea    chronic doe  . Sleep apnea    Treated with CPAP  . Tachycardia     Past Surgical History:  Procedure Laterality Date  . ANTERIOR AND POSTERIOR REPAIR N/A 11/25/2017   Procedure: ANTERIOR (CYSTOCELE) AND POSTERIOR REPAIR (RECTOCELE);  Surgeon: Rubie Maid, MD;  Location: [facility] ORS;  Service: Gynecology;  Laterality: N/A;  . BREAST CYST ASPIRATION Left 03/22/14   neg/ done by Dr Jamal Collin  . CARDIAC CATHETERIZATION  2003   [facility]: No significant coronary artery disease with reduced ejection fraction.  Marland Kitchen CARDIAC DEFIBRILLATOR PLACEMENT    . CARPAL TUNNEL RELEASE Right   . COLONOSCOPY  2015  . COLONOSCOPY WITH PROPOFOL N/A 10/04/2015   Procedure: COLONOSCOPY WITH PROPOFOL;  Surgeon: Lucilla Lame, MD;  Location: [facility];  Service: Endoscopy;  Laterality: N/A;  . CORONARY ANGIOPLASTY    . CYSTOSCOPY  11/15/2014   Procedure: CYSTOSCOPY;  Surgeon: Rubie Maid, MD;  Location: [facility] ORS;  Service: Gynecology;;  . FOOT SURGERY    . HAND SURGERY    .  KNEE ARTHROSCOPY Left 03/02/2015   Procedure: ARTHROSCOPY  LEFT KNEE, PARTIAL LATERAL  MENISECTOMY, SYNOVECTOMY, MEDIAL & LATERAL CHONDROPLASTY;  Surgeon: Thornton Park, MD;  Location: [facility] ORS;  Service: Orthopedics;  Laterality: Left;  Dr. Mack Guise wanted her surgery switched from [facility] because of the Defibrillator.  patient has a Defibrillator  takes Asprin/knows to stop 5days before  has had some test done  . LAPAROSCOPIC SALPINGO OOPHERECTOMY Left 11/15/2014   Procedure: LAPAROSCOPIC OOPHORECTOMY;  Surgeon: Rubie Maid, MD;  Location: [facility] ORS;   Service: Gynecology;  Laterality: Left;  . LAPAROSCOPIC SALPINGOOPHERECTOMY Right 09/2013   also with left salpingectomy  . TONSILLECTOMY    . TOTAL ABDOMINAL HYSTERECTOMY    . TRACHELECTOMY N/A 11/15/2014   Procedure: TRACHELECTOMY;  Surgeon: Rubie Maid, MD;  Location: [facility] ORS;  Service: Gynecology;  Laterality: N/A;  . TUBAL LIGATION Bilateral      Current Outpatient Medications  Medication Sig Dispense Refill  . aspirin EC 81 MG tablet Take 1 tablet (81 mg total) by mouth daily.    . carvedilol (COREG) 25 MG tablet Take 1/2 (one-half) tablet by mouth twice daily 90 tablet 0  . cetirizine (ZYRTEC) 10 MG tablet Take 10 mg by mouth daily.    . cyclobenzaprine (FLEXERIL) 10 MG tablet Take 10 mg by mouth 3 (three) times daily as needed for muscle spasms.    Marland Kitchen esomeprazole (NEXIUM) 40 MG capsule Take 40 mg by mouth every morning.     . fluticasone (FLONASE) 50 MCG/ACT nasal spray Place 1 spray into both nostrils daily.     . furosemide (LASIX) 40 MG tablet Take 40 mg by mouth daily. TAKE ONE TABLET BY MOUTH ONCE DAILY *PATIENT OCCASIONALLY TAKES AN EXTRA TABLET*    . gabapentin (NEURONTIN) 100 MG capsule     . levothyroxine (SYNTHROID, LEVOTHROID) 150 MCG tablet Take 150 mcg by mouth daily before breakfast.     . losartan (COZAAR) 25 MG tablet Take 0.5 tablets (12.5 mg total) by mouth 2 (two) times daily. 90 tablet 3  . nystatin (MYCOSTATIN) powder Apply 1 Bottle topically 4 (four) times daily as needed (irritaton). Reported on 08/18/2015    . oxyCODONE-acetaminophen (PERCOCET/ROXICET) 5-325 MG tablet Take by mouth every 4 (four) hours as needed for severe pain.    . potassium chloride (KLOR-CON) 10 MEQ tablet Take 1 tablet (10 mEq total) by mouth daily. 90 tablet 3  . sertraline (ZOLOFT) 50 MG tablet Take 50 mg by mouth daily.    Marland Kitchen spironolactone (ALDACTONE) 25 MG tablet Take 1/2 (one-half) tablet by mouth once daily 45 tablet 0   No current facility-administered medications for this  visit.    Allergies:   Sulfa antibiotics and Sulfamethoxazole-trimethoprim    Social History:  The patient  reports that she has never smoked. She has never used smokeless tobacco. She reports current alcohol use. She reports that she does not use drugs.   Family History:  The patient's family history includes Breast cancer (age of onset: 46) in her sister; Colon cancer in her mother; Hyperlipidemia in her mother; Hypertension in her mother; Ovarian cancer in her sister; Stroke in her mother; Throat cancer in her brother.    ROS:  Please see the history of present illness.   Otherwise, review of systems are positive for none.   All other systems are reviewed and negative.    PHYSICAL EXAM: VS:  BP 120/76 (BP Location: Left Arm, Patient Position: Sitting, Cuff Size: Large)   Pulse 73  Ht 5\' 7"  (1.702 m)   Wt 259 lb 8 oz (117.7 kg)   SpO2 98%   BMI 40.64 kg/m  , BMI Body mass index is 40.64 kg/m. GEN: Well nourished, well developed, in no acute distress  HEENT: normal  Neck: no JVD, carotid bruits, or masses Cardiac: RRR; no murmurs, rubs, or gallops, trace edema  Respiratory:  clear to auscultation bilaterally, normal work of breathing GI: soft, nontender, nondistended, + BS MS: no deformity or atrophy  Skin: warm and dry, no rash Neuro:  Strength and sensation are intact Psych: euthymic mood, full affect   EKG:  EKG is ordered today. EKG showed normal sinus rhythm with nonspecific ST and T wave changes.  Moderate LVH    Recent Labs: 02/27/2019: Magnesium 2.1; TSH 2.335 04/10/2019: ALT 24; BUN 17; Creatinine, Ser 0.92; Hemoglobin 14.7; Platelets 158; Potassium 4.6; Sodium 137    Lipid Panel No results found for: CHOL, TRIG, HDL, CHOLHDL, VLDL, LDLCALC, LDLDIRECT    Wt Readings from Last 3 Encounters:  12/08/19 259 lb 8 oz (117.7 kg)  06/04/19 255 lb 6 oz (115.8 kg)  04/21/19 257 lb (116.6 kg)       ASSESSMENT AND PLAN:  1.   Chronic systolic heart failure:  Most recent ejection fraction was 45 to 50%.  She is doing well at the present time and continues to be in [place] class II. Continue treatment with carvedilol, losartan and spironolactone.  I reviewed her most recent labs in October which showed normal renal function and electrolytes.  2. Status post ICD placement: Followed by Dr. Caryl Comes  3.  NSVT: No evidence of coronary artery disease, EF is above 35% and she already has an ICD in place.  4.  Obstructive sleep apnea on CPAP.  Disposition:   FU with me in 6 months  Signed,  [name], MD  12/08/2019 1:59 PM    [facility]

## 2020-01-11 ENCOUNTER — Other Ambulatory Visit: Payer: Self-pay | Admitting: Family Medicine

## 2020-01-11 DIAGNOSIS — Z1231 Encounter for screening mammogram for malignant neoplasm of breast: Secondary | ICD-10-CM

## 2020-02-02 DIAGNOSIS — Z8616 Personal history of COVID-19: Secondary | ICD-10-CM

## 2020-02-02 HISTORY — DX: Personal history of COVID-19: Z86.16

## 2020-02-17 ENCOUNTER — Other Ambulatory Visit: Payer: Self-pay | Admitting: Cardiovascular Disease

## 2020-02-17 NOTE — Telephone Encounter (Signed)
Rx request sent to pharmacy.  

## 2020-02-23 ENCOUNTER — Ambulatory Visit (INDEPENDENT_AMBULATORY_CARE_PROVIDER_SITE_OTHER): Payer: Medicare HMO

## 2020-02-23 DIAGNOSIS — I4729 Other ventricular tachycardia: Secondary | ICD-10-CM

## 2020-02-23 DIAGNOSIS — I472 Ventricular tachycardia: Secondary | ICD-10-CM | POA: Diagnosis not present

## 2020-02-23 LAB — CUP PACEART REMOTE DEVICE CHECK
Battery Remaining Longevity: 61 mo
Battery Voltage: 2.99 V
Brady Statistic RV Percent Paced: 0.01 %
Date Time Interrogation Session: 20220125022824
HighPow Impedance: 50 Ohm
HighPow Impedance: 61 Ohm
Implantable Lead Implant Date: 20150508
Implantable Lead Location: 753860
Implantable Lead Model: 185
Implantable Lead Serial Number: 194163
Implantable Pulse Generator Implant Date: 20150508
Lead Channel Impedance Value: 475 Ohm
Lead Channel Impedance Value: 551 Ohm
Lead Channel Pacing Threshold Amplitude: 0.625 V
Lead Channel Pacing Threshold Pulse Width: 0.4 ms
Lead Channel Sensing Intrinsic Amplitude: 8.875 mV
Lead Channel Sensing Intrinsic Amplitude: 8.875 mV
Lead Channel Setting Pacing Amplitude: 2 V
Lead Channel Setting Pacing Pulse Width: 0.4 ms
Lead Channel Setting Sensing Sensitivity: 0.3 mV

## 2020-02-29 ENCOUNTER — Telehealth: Payer: Self-pay | Admitting: Cardiovascular Disease

## 2020-02-29 ENCOUNTER — Encounter: Payer: Self-pay | Admitting: Physician Assistant

## 2020-02-29 NOTE — Telephone Encounter (Signed)
Spoke with the patient. Patient sts that she has an increase in [place] since her COVID dx in early Jan 2022. She denies orthopnea, PND, swelling.  She sts that she also has intermittent chest pressure that is positional not associated with exertion. She can feel her heart racing at times. She is unable to provide HR or BP readings.  Advised the patient that I would recommend that she schedules an appt to be evaluated. Patient is agreeable.  Appt scheduled with Christell Faith, PA on 03/01/20 @ 8am. Patient is aware of the appt date and time.  Patient voiced appreciation for the call back.

## 2020-02-29 NOTE — Telephone Encounter (Signed)
Pt c/o of Chest Pain: STAT if CP now or developed within 24 hours  1. Are you having CP right now? Chest pressure only when bending over   2. Are you experiencing any other symptoms (ex. SOB, nausea, vomiting, sweating)?  Back and shoulder pain interim sob   3. How long have you been experiencing CP? Since first of month when having covid   4. Is your CP continuous or coming and going? Only when bending over   5. Have you taken Nitroglycerin?  Asa increased to 325  ?

## 2020-02-29 NOTE — Progress Notes (Signed)
Cardiology Office Note    Date:  03/01/2020   ID:  Lisa Fuentes, DOB 1960-02-11, MRN [id number]  PCP:  Maryland Pink, MD  Cardiologist:  [name], MD  Electrophysiologist:  Virl Axe, MD   Chief Complaint: Intermittent chest pain and dyspnea following Covid infection  History of Present Illness:   Lisa Fuentes is a 60 y.o. female with history of HFrEF secondary to NICM status post ICD in 2008 with generator change out in 05/2013, Covid infection diagnosed on 02/02/2020, HTN, HLD, hyperthyroidism status post radioactive iodine treatment on thyroid replacement therapy, and OSA on CPAP who presents for evaluation of ongoing intermittent chest pain and dyspnea following Covid infection.  She was diagnosed with CHF in early 2000.  LHC in 2003 showed no significant CAD.  Nuclear stress test in 01/2015 showed no evidence of ischemia EF of 49%.  Echo in 01/2017 showed an EF of 45 to 50%.  Escalation of GDMT has previously been limited with labile blood pressures.  Echo in 03/2019 showed a stable cardiomyopathy with an EF of 45 to 50%, mild LVH, grade 1 diastolic dysfunction, normal RV systolic function and ventricular cavity size, trivial mitral regurgitation, and an estimated right atrial pressure of 3 mmHg.  Coronary CTA in 04/2019 showed a calcium score of 0 with no evidence of CAD.  She was last seen in the office in 11/2019 and was doing well without any symptoms concerning for chest pain with stable exertional dyspnea.  She was under increased stress at home with the health of her brother.  She was diagnosed with Covid infection in early 01/2020 and did not require hospital admission.  She contacted our office on 1/31 noting an increase in exertional dyspnea, intermittent chest pressure and palpitations since her diagnosis of Covid.  In this setting, appointment was scheduled for today.  She was diagnosed with Covid on 01/31/2020.  She had received both vaccines and her booster shot.  She  had mild symptoms and did not require hospitalization.  However, since her Covid illness she has noted intermittent dyspnea, chest discomfort, and palpitations.  Symptoms appear to be worse when she is sitting up and leaning forward.  Her weight is stable by our office scale.  Stable mild lower extremity swelling and two-pillow orthopnea.  She does watch her salt and p.o. fluid consumption.  Currently chest pain-free.   Labs independently reviewed: 11/2019 - Hgb 13.7, PLT 179, potassium 4.3, BUN 15, serum creatinine 0.8, albumin 4.4, AST/ALT normal, TSH 5.373, free T4 normal, TC 171, TG 49, HDL 48, LDL 113 01/2019 - magnesium 2.1  Past Medical History:  Diagnosis Date  . Anemia   . Anxiety    panic attacks  . Arthritis   . Clotting disorder ([place])   . Depression   . Difficult intubation   . Endometriosis of vagina 09/2013   Intra-operative findings of endometriosis implants on cervical stump  . GERD (gastroesophageal reflux disease)   . Headache   . Hypercholesteremia   . Hypertension   . Hypokalemia   . Hypothyroidism   . Implantable defibrillator    Medtronic DOI 2008, replacement 2015  . Menopausal symptoms   . Nonischemic cardiomyopathy (HCC)    Interval improvement EF 40-45% 1/16  . Shortness of breath dyspnea    chronic doe  . Sleep apnea    Treated with CPAP  . Tachycardia     Past Surgical History:  Procedure Laterality Date  . ANTERIOR AND POSTERIOR REPAIR N/A 11/25/2017  Procedure: ANTERIOR (CYSTOCELE) AND POSTERIOR REPAIR (RECTOCELE);  Surgeon: Rubie Maid, MD;  Location: [facility] ORS;  Service: Gynecology;  Laterality: N/A;  . BREAST CYST ASPIRATION Left 03/22/14   neg/ done by Dr Jamal Collin  . CARDIAC CATHETERIZATION  2003   [facility]: No significant coronary artery disease with reduced ejection fraction.  Marland Kitchen CARDIAC DEFIBRILLATOR PLACEMENT    . CARPAL TUNNEL RELEASE Right   . COLONOSCOPY  2015  . COLONOSCOPY WITH PROPOFOL N/A 10/04/2015   Procedure: COLONOSCOPY WITH  PROPOFOL;  Surgeon: Lucilla Lame, MD;  Location: [facility] ENDOSCOPY;  Service: Endoscopy;  Laterality: N/A;  . CORONARY ANGIOPLASTY    . CYSTOSCOPY  11/15/2014   Procedure: CYSTOSCOPY;  Surgeon: Rubie Maid, MD;  Location: [facility] ORS;  Service: Gynecology;;  . FOOT SURGERY    . HAND SURGERY    . KNEE ARTHROSCOPY Left 03/02/2015   Procedure: ARTHROSCOPY  LEFT KNEE, PARTIAL LATERAL  MENISECTOMY, SYNOVECTOMY, MEDIAL & LATERAL CHONDROPLASTY;  Surgeon: Thornton Park, MD;  Location: [facility] ORS;  Service: Orthopedics;  Laterality: Left;  Dr. Mack Guise wanted her surgery switched from [facility] because of the Defibrillator.  patient has a Defibrillator  takes Asprin/knows to stop 5days before  has had some test done  . LAPAROSCOPIC SALPINGO OOPHERECTOMY Left 11/15/2014   Procedure: LAPAROSCOPIC OOPHORECTOMY;  Surgeon: Rubie Maid, MD;  Location: [facility] ORS;  Service: Gynecology;  Laterality: Left;  . LAPAROSCOPIC SALPINGOOPHERECTOMY Right 09/2013   also with left salpingectomy  . TONSILLECTOMY    . TOTAL ABDOMINAL HYSTERECTOMY    . TRACHELECTOMY N/A 11/15/2014   Procedure: TRACHELECTOMY;  Surgeon: Rubie Maid, MD;  Location: [facility] ORS;  Service: Gynecology;  Laterality: N/A;  . TUBAL LIGATION Bilateral     Current Medications: Current Meds  Medication Sig  . aspirin EC 81 MG tablet Take 1 tablet (81 mg total) by mouth daily.  . carvedilol (COREG) 25 MG tablet Take 1/2 (one-half) tablet by mouth twice daily  . cetirizine (ZYRTEC) 10 MG tablet Take 10 mg by mouth daily.  . cyclobenzaprine (FLEXERIL) 10 MG tablet Take 10 mg by mouth 3 (three) times daily as needed for muscle spasms.  Marland Kitchen esomeprazole (NEXIUM) 40 MG capsule Take 40 mg by mouth every morning.   . fluticasone (FLONASE) 50 MCG/ACT nasal spray Place 1 spray into both nostrils daily.   . furosemide (LASIX) 40 MG tablet Take 40 mg by mouth daily. TAKE ONE TABLET BY MOUTH ONCE DAILY *PATIENT OCCASIONALLY TAKES AN EXTRA TABLET*  .  levothyroxine (SYNTHROID, LEVOTHROID) 150 MCG tablet Take 150 mcg by mouth daily before breakfast.   . losartan (COZAAR) 25 MG tablet Take 0.5 tablets (12.5 mg total) by mouth 2 (two) times daily.  Marland Kitchen nystatin (MYCOSTATIN) powder Apply 1 Bottle topically 4 (four) times daily as needed (irritaton). Reported on 08/18/2015  . oxyCODONE-acetaminophen (PERCOCET/ROXICET) 5-325 MG tablet Take by mouth every 4 (four) hours as needed for severe pain.  . potassium chloride (KLOR-CON) 10 MEQ tablet Take 1 tablet (10 mEq total) by mouth daily.  . sertraline (ZOLOFT) 50 MG tablet Take 50 mg by mouth daily.  Marland Kitchen spironolactone (ALDACTONE) 25 MG tablet Take 1/2 (one-half) tablet by mouth once daily    Allergies:   Sulfa antibiotics and Sulfamethoxazole-trimethoprim   Social History   Socioeconomic History  . Marital status: Divorced    Spouse name: Not on file  . Number of children: Not on file  . Years of education: Not on file  . Highest education level: Not on file  Occupational History  . Not on file  Tobacco Use  . Smoking status: Never Smoker  . Smokeless tobacco: Never Used  Vaping Use  . Vaping Use: Never used  Substance and Sexual Activity  . Alcohol use: Yes    Alcohol/week: 0.0 standard drinks    Comment: 5 q week  . Drug use: No  . Sexual activity: Not Currently    Birth control/protection: None  Other Topics Concern  . Not on file  Social History Narrative  . Not on file   Social Determinants of Health   Financial Resource Strain: Not on file  Food Insecurity: Not on file  Transportation Needs: Not on file  Physical Activity: Not on file  Stress: Not on file  Social Connections: Not on file     Family History:  The patient's family history includes Breast cancer (age of onset: 84) in her sister; Colon cancer in her mother; Hyperlipidemia in her mother; Hypertension in her mother; Ovarian cancer in her sister; Stroke in her mother; Throat cancer in her brother.  ROS:    Review of Systems  Constitutional: Positive for malaise/fatigue. Negative for chills, diaphoresis, fever and weight loss.  HENT: Negative for congestion.   Eyes: Negative for discharge and redness.  Respiratory: Positive for shortness of breath. Negative for cough, sputum production and wheezing.   Cardiovascular: Positive for chest pain and palpitations. Negative for orthopnea, claudication, leg swelling and PND.  Gastrointestinal: Negative for abdominal pain, heartburn, nausea and vomiting.  Musculoskeletal: Negative for falls and myalgias.  Skin: Negative for rash.  Neurological: Positive for weakness. Negative for dizziness, tingling, tremors, sensory change, speech change, focal weakness and loss of consciousness.  Endo/Heme/Allergies: Does not bruise/bleed easily.  Psychiatric/Behavioral: Negative for substance abuse. The patient is not nervous/anxious.   All other systems reviewed and are negative.    EKGs/Labs/Other Studies Reviewed:    Studies reviewed were summarized above. The additional studies were reviewed today:  2D Echo 01/2017: - Left ventricle: The cavity size was mildly dilated. There was mild concentric hypertrophy. Systolic function was mildly reduced. The estimated ejection fraction was in the range of 45% to 50%. Doppler parameters are consistent with abnormal left ventricular relaxation (grade 1 diastolic dysfunction). - Left atrium: The atrium was mildly dilated. - Pulmonary arteries: Systolic pressure could not be accurately estimated. __________  2D Echo 03/24/2019: 1. Left ventricular ejection fraction, by estimation, is 45 to 50%. The  left ventricle has mildly decreased function. The left ventricle  demonstrates global hypokinesis. The left ventricular internal cavity size  was mildly dilated. There is mild left  ventricular hypertrophy. Left ventricular diastolic parameters are  consistent with Grade I diastolic dysfunction (impaired  relaxation).  2. Right ventricular systolic function is normal. The right ventricular  size is normal. Tricuspid regurgitation signal is inadequate for assessing  PA pressure.  3. The mitral valve is normal in structure and function. Trivial mitral  valve regurgitation.  4. The aortic valve is tricuspid. Aortic valve regurgitation is not  visualized. No aortic stenosis is present.  5. Aortic dilatation noted. There is borderline dilatation of the aortic  root measuring 37 mm.  6. Mildly dilated pulmonary artery.  7. The inferior vena cava is normal in size with greater than 50%  respiratory variability, suggesting right atrial pressure of 3 mmHg. __________  Coronary CTA 04/2019: IMPRESSION: 1. No evidence of CAD, CADRADS = 0.  2. Coronary calcium score of 0.  3. Normal coronary origin with right dominance.  EKG:  EKG is ordered today.  The EKG ordered today demonstrates NSR, 73 bpm, nonspecific ST-T changes along leads V4, V5, V6, which are slightly more pronounced when compared to prior tracing  Recent Labs: 04/10/2019: ALT 24; BUN 17; Creatinine, Ser 0.92; Hemoglobin 14.7; Platelets 158; Potassium 4.6; Sodium 137  Recent Lipid Panel No results found for: CHOL, TRIG, HDL, CHOLHDL, VLDL, LDLCALC, LDLDIRECT  PHYSICAL EXAM:    VS:  BP 110/80 (BP Location: Left Arm, Patient Position: Sitting, Cuff Size: Normal)   Pulse 73   Ht 5\' 7"  (1.702 m)   Wt 256 lb (116.1 kg)   SpO2 96%   BMI 40.10 kg/m   BMI: Body mass index is 40.1 kg/m.  Physical Exam Vitals reviewed.  Constitutional:      Appearance: She is well-developed and well-nourished.  HENT:     Head: Normocephalic and atraumatic.  Eyes:     General:        Right eye: No discharge.        Left eye: No discharge.  Neck:     Vascular: No JVD.  Cardiovascular:     Rate and Rhythm: Normal rate and regular rhythm.     Pulses: No midsystolic click and no opening snap.          Posterior tibial pulses are 2+ on  the right side and 2+ on the left side.     Heart sounds: Normal heart sounds, S1 normal and S2 normal. Heart sounds not distant. No murmur heard. No friction rub.  Pulmonary:     Effort: Pulmonary effort is normal. No respiratory distress.     Breath sounds: Normal breath sounds. No decreased breath sounds, wheezing or rales.  Chest:     Chest wall: No tenderness.  Abdominal:     General: There is no distension.     Palpations: Abdomen is soft.     Tenderness: There is no abdominal tenderness.  Musculoskeletal:        General: No edema.     Cervical back: Normal range of motion.  Skin:    General: Skin is warm and dry.     Nails: There is no clubbing or cyanosis.  Neurological:     Mental Status: She is alert and oriented to person, place, and time.  Psychiatric:        Mood and Affect: Mood and affect normal.        Speech: Speech normal.        Behavior: Behavior normal.        Thought Content: Thought content normal.        Judgment: Judgment normal.     Wt Readings from Last 3 Encounters:  03/01/20 256 lb (116.1 kg)  12/08/19 259 lb 8 oz (117.7 kg)  06/04/19 255 lb 6 oz (115.8 kg)     ASSESSMENT & PLAN:   1. Chest pain/dyspnea/palpitations: These have occurred in the presence of her recent Covid infection which I have explained to her can be quite common.  From a cardiac perspective, she recently underwent coronary CTA which showed a calcium score of 0 and no evidence of CAD.  Schedule Lexiscan MPI.  Risks and benefits of nuclear stress test were discussed in detail.  Oxygen saturation 96% on room air.  She is not tachycardic.  We will obtain an echo.  She will send a remote device interrogation this evening so we can further evaluate her palpitations.  If these are unrevealing, recommend she reach out  to her PCP for possible referral to [place] [facility].  2. HFrEF secondary to NICM status post ICD: She appears euvolemic and well compensated.  She has NYHA class  II symptoms.  Most recent EF of 50%.  Continue GDMT including carvedilol, losartan, spironolactone, and furosemide.  Labs in 11/2019 showed normal renal function and potassium.  CHF education.  3. NSVT: No evidence of CAD on coronary CTA with noted EF greater than 35%.  ICD already in place.  Quiescent.  Continue carvedilol.  4. HTN: Blood pressure is well controlled.  Continue current medications as outlined above.  5. HLD: LDL 113 from 11/2019.  No evidence of coronary artery calcification or aortic atherosclerosis on coronary CTA in 04/2019.  Recommend heart healthy diet.  6. OSA: Continue CPAP.  7. Recent Covid infection: Plan as outlined above.  Disposition: F/u with Dr. Fletcher Anon or an APP in 2 months, and EP as directed.   Medication Adjustments/Labs and Tests Ordered: Current medicines are reviewed at length with the patient today.  Concerns regarding medicines are outlined above. Medication changes, Labs and Tests ordered today are summarized above and listed in the Patient Instructions accessible in Encounters.   Signed, Christell Faith, PA-C 03/01/2020 9:21 AM     [facility] [address] [place], [place] [address] [phone]

## 2020-03-01 ENCOUNTER — Other Ambulatory Visit: Payer: Self-pay

## 2020-03-01 ENCOUNTER — Telehealth: Payer: Self-pay | Admitting: Physician Assistant

## 2020-03-01 ENCOUNTER — Ambulatory Visit: Payer: Medicare HMO | Admitting: Physician Assistant

## 2020-03-01 ENCOUNTER — Encounter: Payer: Self-pay | Admitting: Physician Assistant

## 2020-03-01 VITALS — BP 110/80 | HR 73 | Ht 67.0 in | Wt 256.0 lb

## 2020-03-01 DIAGNOSIS — G4730 Sleep apnea, unspecified: Secondary | ICD-10-CM

## 2020-03-01 DIAGNOSIS — Z95810 Presence of automatic (implantable) cardiac defibrillator: Secondary | ICD-10-CM

## 2020-03-01 DIAGNOSIS — R079 Chest pain, unspecified: Secondary | ICD-10-CM

## 2020-03-01 DIAGNOSIS — I5022 Chronic systolic (congestive) heart failure: Secondary | ICD-10-CM

## 2020-03-01 DIAGNOSIS — R0609 Other forms of dyspnea: Secondary | ICD-10-CM | POA: Diagnosis not present

## 2020-03-01 DIAGNOSIS — I4729 Other ventricular tachycardia: Secondary | ICD-10-CM

## 2020-03-01 DIAGNOSIS — I472 Ventricular tachycardia: Secondary | ICD-10-CM

## 2020-03-01 DIAGNOSIS — U071 COVID-19: Secondary | ICD-10-CM

## 2020-03-01 DIAGNOSIS — I10 Essential (primary) hypertension: Secondary | ICD-10-CM

## 2020-03-01 DIAGNOSIS — T82118D Breakdown (mechanical) of other cardiac electronic device, subsequent encounter: Secondary | ICD-10-CM

## 2020-03-01 DIAGNOSIS — I428 Other cardiomyopathies: Secondary | ICD-10-CM | POA: Diagnosis not present

## 2020-03-01 DIAGNOSIS — E782 Mixed hyperlipidemia: Secondary | ICD-10-CM

## 2020-03-01 NOTE — Telephone Encounter (Signed)
--

## 2020-03-01 NOTE — Patient Instructions (Addendum)
Medication Instructions:  No changes  *If you need a refill on your cardiac medications before your next appointment, please call your pharmacy*   Lab Work: None  If you have labs (blood work) drawn today and your tests are completely normal, you will receive your results only by: Marland Kitchen MyChart Message (if you have MyChart) OR . A paper copy in the mail If you have any lab test that is abnormal or we need to change your treatment, we will call you to review the results.   Testing/Procedures:  Your physician has requested that you have an echocardiogram. Echocardiography is a painless test that uses sound waves to create images of your heart. It provides your doctor with information about the size and shape of your heart and how well your heart's chambers and valves are working. This procedure takes approximately one hour. There are no restrictions for this procedure.  [place]  Your caregiver has ordered a Stress Test with nuclear imaging. The purpose of this test is to evaluate the blood supply to your heart muscle. This procedure is referred to as a "Non-Invasive Stress Test." This is because other than having an IV started in your vein, nothing is inserted or "invades" your body. Cardiac stress tests are done to find areas of poor blood flow to the heart by determining the extent of coronary artery disease (CAD). Some patients exercise on a treadmill, which naturally increases the blood flow to your heart, while others who are  unable to walk on a treadmill due to physical limitations have a pharmacologic/chemical stress agent called Lexiscan . This medicine will mimic walking on a treadmill by temporarily increasing your coronary blood flow.   Please note: these test may take anywhere between 2-4 hours to complete  PLEASE REPORT TO [facility] AT THE FIRST DESK WILL DIRECT YOU WHERE TO GO  Date of  Procedure:_____________________________________  Arrival Time for Procedure:______________________________  Instructions regarding medication:   _XX___ : Hold Furosemide, Spironolactone, and Potassium. diabetes medication morning of procedure  _XX___:  Hold Carvedilol the night before procedure and morning of procedure   PLEASE NOTIFY THE OFFICE AT LEAST 24 HOURS IN ADVANCE IF YOU ARE UNABLE TO KEEP YOUR APPOINTMENT.  [phone] AND  PLEASE NOTIFY NUCLEAR MEDICINE AT [facility] AT LEAST 24 HOURS IN ADVANCE IF YOU ARE UNABLE TO KEEP YOUR APPOINTMENT. [phone]  How to prepare for your Myoview test:  1. Do not eat or drink after midnight 2. No caffeine for 24 hours prior to test 3. No smoking 24 hours prior to test. 4. Your medication may be taken with water.  If your doctor stopped a medication because of this test, do not take that medication. 5. Ladies, please do not wear dresses.  Skirts or pants are appropriate. Please wear a short sleeve shirt. 6. No perfume, cologne or lotion. 7. Wear comfortable walking shoes. No heels!    Follow-Up: At [facility], you and your health needs are our priority.  As part of our continuing mission to provide you with exceptional heart care, we have created designated Provider Care Teams.  These Care Teams include your primary Cardiologist (physician) and Advanced Practice Providers (APPs -  Physician Assistants and Nurse Practitioners) who all work together to provide you with the care you need, when you need it.  We recommend signing up for the patient portal called "MyChart".  Sign up information is provided on this After Visit Summary.  MyChart is used to  connect with patients for Virtual Visits (Telemedicine).  Patients are able to view lab/test results, encounter notes, upcoming appointments, etc.  Non-urgent messages can be sent to your provider as well.   To learn more about what you can do with MyChart, go to [email or web address].     Your next appointment:   2 month(s)  The format for your next appointment:   In Person  Provider:   [name], MD or Christell Faith, PA-C

## 2020-03-01 NOTE — Telephone Encounter (Signed)
Patient states he Medtronic machine is not working, states she is unable to send a transmission. Please call to discuss.

## 2020-03-01 NOTE — Telephone Encounter (Signed)
Spoke with patient about her home monitor. Last remote received was on 02/23/2020. Patient states that when she tried to send a transmission through a few months ago, that she was having symptoms and could not send a manual transmission. Patient denies any symptoms today but states that she still can not send manual transmissions. Advised patient that we would check with Medtr[employer] and see if another monitor could be ordered. Advised patient if any questions, we would call her back at [phone].

## 2020-03-02 NOTE — Telephone Encounter (Signed)
7-day Zio can be ordered in the setting of delayed access to ICD transmissions. . Please still have her perform a device upload when she gets her new home transmission device.

## 2020-03-02 NOTE — Telephone Encounter (Signed)
Patient calling to relay below that she is unable to send a transmission and wants to know if she needs a zio.

## 2020-03-03 ENCOUNTER — Ambulatory Visit (INDEPENDENT_AMBULATORY_CARE_PROVIDER_SITE_OTHER): Payer: Medicare HMO

## 2020-03-03 DIAGNOSIS — T82118D Breakdown (mechanical) of other cardiac electronic device, subsequent encounter: Secondary | ICD-10-CM

## 2020-03-03 DIAGNOSIS — R079 Chest pain, unspecified: Secondary | ICD-10-CM

## 2020-03-03 NOTE — Addendum Note (Signed)
Addended by: Wynema Birch on: 03/03/2020 08:36 AM   Modules accepted: Orders

## 2020-03-03 NOTE — Telephone Encounter (Addendum)
Able to reach out to Lisa Fuentes, advised that Lisa Faith, PA-C suggested a 7-day Zio XT wear d/t the delay with there ICD transmissions. Lisa Fuentes is in agreement with Zio, asked of it to be mailed to her address, current address on file. Instructions of placement, activation, logging events, and returning monitor was discuss. Pt was able to verbalized understanding, reports daughter who works at [employer] can assist with applying monitor. Advised that once she gets her new home transmission device that she performs a device upload, Pt verbalized understanding. All questions or concerns were address and no additional concerns at this time. Agreeable to plan, will call back for anything further.

## 2020-03-04 ENCOUNTER — Other Ambulatory Visit: Payer: Self-pay

## 2020-03-04 ENCOUNTER — Ambulatory Visit (LOCAL_COMMUNITY_HEALTH_CENTER): Payer: Medicare HMO

## 2020-03-04 DIAGNOSIS — Z111 Encounter for screening for respiratory tuberculosis: Secondary | ICD-10-CM

## 2020-03-05 NOTE — Progress Notes (Signed)
Remote ICD transmission.   

## 2020-03-07 ENCOUNTER — Other Ambulatory Visit: Payer: Self-pay

## 2020-03-07 ENCOUNTER — Other Ambulatory Visit: Payer: Medicare HMO

## 2020-03-07 ENCOUNTER — Ambulatory Visit (LOCAL_COMMUNITY_HEALTH_CENTER): Payer: Medicare HMO

## 2020-03-07 DIAGNOSIS — Z111 Encounter for screening for respiratory tuberculosis: Secondary | ICD-10-CM

## 2020-03-07 LAB — TB SKIN TEST
Induration: 0 mm
TB Skin Test: NEGATIVE

## 2020-03-08 DIAGNOSIS — T82118D Breakdown (mechanical) of other cardiac electronic device, subsequent encounter: Secondary | ICD-10-CM

## 2020-03-08 DIAGNOSIS — R079 Chest pain, unspecified: Secondary | ICD-10-CM | POA: Diagnosis not present

## 2020-03-08 DIAGNOSIS — I471 Supraventricular tachycardia: Secondary | ICD-10-CM | POA: Diagnosis not present

## 2020-03-21 ENCOUNTER — Encounter
Admission: RE | Admit: 2020-03-21 | Discharge: 2020-03-21 | Disposition: A | Discharge status: Home / Self Care | Payer: Medicare HMO | Source: Ambulatory Visit | Attending: Physician Assistant | Admitting: Physician Assistant

## 2020-03-21 ENCOUNTER — Ambulatory Visit (INDEPENDENT_AMBULATORY_CARE_PROVIDER_SITE_OTHER): Payer: Medicare HMO

## 2020-03-21 ENCOUNTER — Telehealth: Payer: Self-pay | Admitting: *Deleted

## 2020-03-21 ENCOUNTER — Other Ambulatory Visit: Payer: Self-pay

## 2020-03-21 DIAGNOSIS — R079 Chest pain, unspecified: Secondary | ICD-10-CM | POA: Diagnosis not present

## 2020-03-21 DIAGNOSIS — R0609 Other forms of dyspnea: Secondary | ICD-10-CM

## 2020-03-21 LAB — NM MYOCAR MULTI W/SPECT W/WALL MOTION / EF
LV dias vol: 163 mL (ref 46–106)
LV sys vol: 89 mL
Peak HR: 103 {beats}/min
Percent HR: 63 %
Rest HR: 71 {beats}/min
SDS: 2
SRS: 6
SSS: 4
TID: 1

## 2020-03-21 MED ORDER — TECHNETIUM TC 99M TETROFOSMIN IV KIT
10.0000 | PACK | Freq: Once | INTRAVENOUS | Status: AC | PRN
  Administered 2020-03-21: 8.958 via INTRAVENOUS

## 2020-03-21 MED ORDER — TECHNETIUM TC 99M TETROFOSMIN IV KIT
30.0000 | PACK | Freq: Once | INTRAVENOUS | Status: AC | PRN
  Administered 2020-03-21: 30.407 via INTRAVENOUS

## 2020-03-21 MED ORDER — REGADENOSON 0.4 MG/5ML IV SOLN
0.4000 mg | Freq: Once | INTRAVENOUS | Status: AC
  Administered 2020-03-21: 0.4 mg via INTRAVENOUS

## 2020-03-21 NOTE — Telephone Encounter (Signed)
-----   Message from Rise Mu, PA-C sent at 03/21/2020  4:51 PM EST ----- Stress test was abnormal and concerning for possible blockage along the LAD territory. This is new when compared to her coronary CTA in 2021. Pump function was moderately reduced at 30-44%, with prior EF noted to be 45-50% by echo in 03/2019. Please schedule follow up with me this week to discuss diagnostic cath; I have clinic on 2/25. Echo to further evaluate her EF, which was also performed today, is pending final read. Based on echo results, we will look to further optimize her medical therapy.

## 2020-03-21 NOTE — Telephone Encounter (Signed)
Results and recommendations reviewed with patient and scheduled appointment for Friday. She verbalized understanding and reason for appointment to review plan. She was appreciative for the call with no further questions at this time.

## 2020-03-22 LAB — ECHOCARDIOGRAM COMPLETE
Area-P 1/2: 3.91 cm2
Calc EF: 48.1 %
S' Lateral: 4.2 cm
Single Plane A2C EF: 47 %
Single Plane A4C EF: 50 %

## 2020-03-23 NOTE — Progress Notes (Signed)
Cardiology Office Note    Date:  03/25/2020   ID:  Lisa Fuentes, DOB 07-Feb-1960, MRN [id number]  PCP:  Maryland Pink, MD  Cardiologist:  [name], MD  Electrophysiologist:  Virl Axe, MD   Chief Complaint: Abnormal Lexiscan MPI  History of Present Illness:   Lisa Fuentes is a 60 y.o. female with history of HFrEF secondary to NICM status post ICD in 2008 with generator change out in 05/2013, Covid infection diagnosed on 02/02/2020, HTN, HLD, hyperthyroidism status post radioactive iodine treatment on thyroid replacement therapy, and OSA on CPAP who presents for evaluation of ongoing intermittent chest pain and dyspnea following Covid infection.  She was diagnosed with CHF in early 2000.  LHC in 2003 showed no significant CAD.  Nuclear stress test in 01/2015 showed no evidence of ischemia EF of 49%.  Echo in 01/2017 showed an EF of 45 to 50%.  Escalation of GDMT has previously been limited with labile blood pressures.  Echo in 03/2019 showed a stable cardiomyopathy with an EF of 45 to 50%, mild LVH, grade 1 diastolic dysfunction, normal RV systolic function and ventricular cavity size, trivial mitral regurgitation, and an estimated right atrial pressure of 3 mmHg.  Coronary CTA in 04/2019 showed a calcium score of 0 with no evidence of CAD.  She was last seen in the office in 11/2019 and was doing well without any symptoms concerning for chest pain with stable exertional dyspnea.  She was under increased stress at home with the health of her brother.  She was diagnosed with Covid infection in early 01/2020 and did not require hospital admission.  She was fully vaccinated/boosted.  Following this, she was seen on 03/01/2020 with intermittent dyspnea, chest discomfort, and palpitations with stable weight.  In this setting, she underwent Lexiscan MPI on 03/21/2020 which showed a large defect of moderate severity present in the mid anterior, apical anterior, and apex location consistent with LAD  distribution ischemia with an EF of 30-44%, and was overall an intermediate risk scan.  Echo on 03/21/2020, showed an unchanged cardiomyopathy with an EF of 40-45%, unable to assess WMA, Gr1DD, normal RVSF and ventricular cavity size, no significant valvular abnormalities and an estimated right atrial pressure of 3 mmHg.   She comes in today to discuss her abnormal Myoview.  She is accompanied by her husband. Since she was last seen she feels about the same. She continues to note randomly occurring chest discomfort that is often described as a sharp sensation substernally and will last for several minutes followed by spontaneous resolution. She also notes a second type of chest discomfort that is a pressure and associated with exertion. With this there is some associated shortness of breath and there has been one episode of diaphoresis. She is currently chest pain-free.   Labs independently reviewed: 11/2019 - Hgb 13.7, PLT 179, potassium 4.3, BUN 15, serum creatinine 0.8, albumin 4.4, AST/ALT normal, TSH 5.373, free T4 normal, TC 171, TG 49, HDL 48, LDL 113 01/2019 - magnesium 2.1   Past Medical History:  Diagnosis Date  . Anemia   . Anxiety    panic attacks  . Arthritis   . Clotting disorder ([place])   . Depression   . Difficult intubation   . Endometriosis of vagina 09/2013   Intra-operative findings of endometriosis implants on cervical stump  . GERD (gastroesophageal reflux disease)   . Headache   . Hypercholesteremia   . Hypertension   . Hypokalemia   . Hypothyroidism   .  Implantable defibrillator    [employer] DOI 2008, replacement 2015  . Menopausal symptoms   . Nonischemic cardiomyopathy (HCC)    Interval improvement EF 40-45% 1/16  . Shortness of breath dyspnea    chronic doe  . Sleep apnea    Treated with CPAP  . Tachycardia     Past Surgical History:  Procedure Laterality Date  . ANTERIOR AND POSTERIOR REPAIR N/A 11/25/2017   Procedure: ANTERIOR (CYSTOCELE) AND  POSTERIOR REPAIR (RECTOCELE);  Surgeon: Rubie Maid, MD;  Location: [facility] ORS;  Service: Gynecology;  Laterality: N/A;  . BREAST CYST ASPIRATION Left 03/22/14   neg/ done by Dr Jamal Collin  . CARDIAC CATHETERIZATION  2003   [facility]: No significant coronary artery disease with reduced ejection fraction.  Marland Kitchen CARDIAC DEFIBRILLATOR PLACEMENT    . CARPAL TUNNEL RELEASE Right   . COLONOSCOPY  2015  . COLONOSCOPY WITH PROPOFOL N/A 10/04/2015   Procedure: COLONOSCOPY WITH PROPOFOL;  Surgeon: Lucilla Lame, MD;  Location: [facility] ENDOSCOPY;  Service: Endoscopy;  Laterality: N/A;  . CORONARY ANGIOPLASTY    . CYSTOSCOPY  11/15/2014   Procedure: CYSTOSCOPY;  Surgeon: Rubie Maid, MD;  Location: [facility] ORS;  Service: Gynecology;;  . FOOT SURGERY    . HAND SURGERY    . KNEE ARTHROSCOPY Left 03/02/2015   Procedure: ARTHROSCOPY  LEFT KNEE, PARTIAL LATERAL  MENISECTOMY, SYNOVECTOMY, MEDIAL & LATERAL CHONDROPLASTY;  Surgeon: Thornton Park, MD;  Location: [facility] ORS;  Service: Orthopedics;  Laterality: Left;  Dr. Mack Guise wanted her surgery switched from [facility] because of the Defibrillator.  patient has a Defibrillator  takes Asprin/knows to stop 5days before  has had some test done  . LAPAROSCOPIC SALPINGO OOPHERECTOMY Left 11/15/2014   Procedure: LAPAROSCOPIC OOPHORECTOMY;  Surgeon: Rubie Maid, MD;  Location: [facility] ORS;  Service: Gynecology;  Laterality: Left;  . LAPAROSCOPIC SALPINGOOPHERECTOMY Right 09/2013   also with left salpingectomy  . TONSILLECTOMY    . TOTAL ABDOMINAL HYSTERECTOMY    . TRACHELECTOMY N/A 11/15/2014   Procedure: TRACHELECTOMY;  Surgeon: Rubie Maid, MD;  Location: [facility] ORS;  Service: Gynecology;  Laterality: N/A;  . TUBAL LIGATION Bilateral     Current Medications: Current Meds  Medication Sig  . aspirin EC 81 MG tablet Take 1 tablet (81 mg total) by mouth daily.  . carvedilol (COREG) 25 MG tablet Take 1/2 (one-half) tablet by mouth twice daily  . cetirizine (ZYRTEC) 10 MG  tablet Take 10 mg by mouth daily.  . cyclobenzaprine (FLEXERIL) 10 MG tablet Take 10 mg by mouth 3 (three) times daily as needed for muscle spasms.  Marland Kitchen esomeprazole (NEXIUM) 40 MG capsule Take 40 mg by mouth every morning.   . fluticasone (FLONASE) 50 MCG/ACT nasal spray Place 1 spray into both nostrils daily.   . furosemide (LASIX) 40 MG tablet Take 40 mg by mouth daily. TAKE ONE TABLET BY MOUTH ONCE DAILY *PATIENT OCCASIONALLY TAKES AN EXTRA TABLET*  . levothyroxine (SYNTHROID, LEVOTHROID) 150 MCG tablet Take 150 mcg by mouth daily before breakfast.   . losartan (COZAAR) 25 MG tablet Take 0.5 tablets (12.5 mg total) by mouth 2 (two) times daily.  Marland Kitchen nystatin (MYCOSTATIN) powder Apply 1 Bottle topically 4 (four) times daily as needed (irritaton). Reported on 08/18/2015  . oxyCODONE-acetaminophen (PERCOCET/ROXICET) 5-325 MG tablet Take by mouth every 4 (four) hours as needed for severe pain.  . potassium chloride (KLOR-CON) 10 MEQ tablet Take 1 tablet (10 mEq total) by mouth daily.  . sertraline (ZOLOFT) 50 MG tablet Take 50 mg by  mouth daily.  Marland Kitchen spironolactone (ALDACTONE) 25 MG tablet Take 1/2 (one-half) tablet by mouth once daily    Allergies:   Sulfa antibiotics and Sulfamethoxazole-trimethoprim   Social History   Socioeconomic History  . Marital status: Divorced    Spouse name: Not on file  . Number of children: Not on file  . Years of education: Not on file  . Highest education level: Not on file  Occupational History  . Not on file  Tobacco Use  . Smoking status: Never Smoker  . Smokeless tobacco: Never Used  Vaping Use  . Vaping Use: Never used  Substance and Sexual Activity  . Alcohol use: Not Currently    Alcohol/week: 0.0 standard drinks    Comment: occassionally  . Drug use: No  . Sexual activity: Not Currently    Birth control/protection: None  Other Topics Concern  . Not on file  Social History Narrative  . Not on file   Social Determinants of Health   Financial  Resource Strain: Not on file  Food Insecurity: Not on file  Transportation Needs: Not on file  Physical Activity: Not on file  Stress: Not on file  Social Connections: Not on file     Family History:  The patient's family history includes Breast cancer (age of onset: 42) in her sister; Colon cancer in her mother; Hyperlipidemia in her mother; Hypertension in her mother; Ovarian cancer in her sister; Stroke in her mother; Throat cancer in her brother.  ROS:   Review of Systems  Constitutional: Positive for malaise/fatigue. Negative for chills, diaphoresis, fever and weight loss.  HENT: Negative for congestion.   Eyes: Negative for discharge and redness.  Respiratory: Positive for shortness of breath. Negative for cough, sputum production and wheezing.   Cardiovascular: Positive for chest pain. Negative for palpitations, orthopnea, claudication, leg swelling and PND.  Gastrointestinal: Negative for abdominal pain, heartburn, nausea and vomiting.  Musculoskeletal: Negative for falls and myalgias.  Skin: Negative for rash.  Neurological: Positive for weakness. Negative for dizziness, tingling, tremors, sensory change, speech change, focal weakness and loss of consciousness.  Endo/Heme/Allergies: Does not bruise/bleed easily.  Psychiatric/Behavioral: Negative for substance abuse. The patient is not nervous/anxious.   All other systems reviewed and are negative.    EKGs/Labs/Other Studies Reviewed:    Studies reviewed were summarized above. The additional studies were reviewed today:  2D Echo 01/2017: - Left ventricle: The cavity size was mildly dilated. There was mild concentric hypertrophy. Systolic function was mildly reduced. The estimated ejection fraction was in the range of 45% to 50%. Doppler parameters are consistent with abnormal left ventricular relaxation (grade 1 diastolic dysfunction). - Left atrium: The atrium was mildly dilated. - Pulmonary arteries: Systolic  pressure could not be accurately estimated. __________  2D Echo 03/24/2019: 1. Left ventricular ejection fraction, by estimation, is 45 to 50%. The  left ventricle has mildly decreased function. The left ventricle  demonstrates global hypokinesis. The left ventricular internal cavity size  was mildly dilated. There is mild left  ventricular hypertrophy. Left ventricular diastolic parameters are  consistent with Grade I diastolic dysfunction (impaired relaxation).  2. Right ventricular systolic function is normal. The right ventricular  size is normal. Tricuspid regurgitation signal is inadequate for assessing  PA pressure.  3. The mitral valve is normal in structure and function. Trivial mitral  valve regurgitation.  4. The aortic valve is tricuspid. Aortic valve regurgitation is not  visualized. No aortic stenosis is present.  5. Aortic dilatation noted. There  is borderline dilatation of the aortic  root measuring 37 mm.  6. Mildly dilated pulmonary artery.  7. The inferior vena cava is normal in size with greater than 50%  respiratory variability, suggesting right atrial pressure of 3 mmHg. __________  Coronary CTA 04/2019: IMPRESSION: 1. No evidence of CAD, CADRADS = 0. 2. Coronary calcium score of 0. 3. Normal coronary origin with right dominance. __________  Carlton Adam MPI 03/21/2020:  There was no ST segment deviation noted during stress.  No T wave inversion was noted during stress.  Defect 1: There is a large defect of moderate severity present in the mid anterior, apical anterior and apex location.  Findings consistent with ischemia in the LAD distribution.  This is an intermediate risk study.  The left ventricular ejection fraction is moderately decreased (30-44%).  __________  2D echo 03/21/2020: 1. Left ventricular ejection fraction, by estimation, is 40 to 45%. The  left ventricle has mildly decreased function. Left ventricular endocardial  border  not optimally defined to evaluate regional wall motion. Left  ventricular diastolic parameters are  consistent with Grade I diastolic dysfunction (impaired relaxation).  2. Right ventricular systolic function is normal. The right ventricular  size is normal.  3. The mitral valve is normal in structure. No evidence of mitral valve  regurgitation. No evidence of mitral stenosis.  4. The aortic valve is normal in structure. Aortic valve regurgitation is  not visualized. No aortic stenosis is present.  5. The inferior vena cava is normal in size with greater than 50%  respiratory variability, suggesting right atrial pressure of 3 mmHg.   EKG:  EKG is not ordered today given recent nuclear stress test.    Recent Labs: 04/10/2019: ALT 24; BUN 17; Creatinine, Ser 0.92; Hemoglobin 14.7; Platelets 158; Potassium 4.6; Sodium 137  Recent Lipid Panel No results found for: CHOL, TRIG, HDL, CHOLHDL, VLDL, LDLCALC, LDLDIRECT  PHYSICAL EXAM:    VS:  BP 110/80 (BP Location: Left Arm, Patient Position: Sitting, Cuff Size: Large)   Pulse 86   Ht 5\' 7"  (1.702 m)   Wt 257 lb (116.6 kg)   SpO2 98%   BMI 40.25 kg/m   BMI: Body mass index is 40.25 kg/m.  Physical Exam Vitals reviewed.  Constitutional:      Appearance: She is well-developed and well-nourished.  HENT:     Head: Normocephalic and atraumatic.  Eyes:     General:        Right eye: No discharge.        Left eye: No discharge.  Neck:     Vascular: No JVD.  Cardiovascular:     Rate and Rhythm: Normal rate and regular rhythm.     Pulses: No midsystolic click and no opening snap.          Posterior tibial pulses are 2+ on the right side and 2+ on the left side.     Heart sounds: Normal heart sounds, S1 normal and S2 normal. Heart sounds not distant. No murmur heard. No friction rub.  Pulmonary:     Effort: Pulmonary effort is normal. No respiratory distress.     Breath sounds: Normal breath sounds. No decreased breath sounds,  wheezing or rales.  Chest:     Chest wall: No tenderness.  Abdominal:     General: There is no distension.     Palpations: Abdomen is soft.     Tenderness: There is no abdominal tenderness.  Musculoskeletal:        General:  No edema.     Cervical back: Normal range of motion.  Skin:    General: Skin is warm and dry.     Nails: There is no clubbing or cyanosis.  Neurological:     Mental Status: She is alert and oriented to person, place, and time.  Psychiatric:        Mood and Affect: Mood and affect normal.        Speech: Speech normal.        Behavior: Behavior normal.        Thought Content: Thought content normal.        Judgment: Judgment normal.     Wt Readings from Last 3 Encounters:  03/25/20 257 lb (116.6 kg)  03/01/20 256 lb (116.1 kg)  12/08/19 259 lb 8 oz (117.7 kg)     ASSESSMENT & PLAN:   1. Unstable angina with abnormal Lexiscan MPI: Currently chest pain-free.  Interestingly, coronary CTA in 04/2019 showed a calcium score of 0 with no evidence of CAD.  She was seen recently in the office for chest pain after having had Covid.  In this setting she underwent Lexiscan MPI which showed a perfusion defect in the LAD territory concerning for ischemia.  Schedule LHC with Dr. Fletcher Anon.  Continue ASA, carvedilol, and losartan.  Escalate lipid therapy as indicated based on cath results.  Risks and benefits of cardiac catheterization have been discussed with the patient including risks of bleeding, bruising, infection, kidney damage, stroke, heart attack, urgent need for bypass, injury to limb, and death. The patient understands these risks and is willing to proceed with the procedure. All questions have been answered and concerns listened to.   2. HFrEF s/p ICD: She appears euvolemic and well compensated with NYHA class II symptoms.  Recent echo demonstrated a stable EF of 45%.  Continue current GDMT including carvedilol, losartan, spironolactone, and furosemide.  Consider addition  of SGLT2 inhibitor and follow-up after her cath.  3. NSVT: Recent coronary CTA without evidence of CAD with baseline EF stable at 45%.  She is already status post ICD.  With previously noted palpitations and inability to transmit a remote interrogation Zio patch was placed and is ending at this time.  She remains on carvedilol.  4. HTN: Blood pressure is well controlled.  Continue current medications as outlined above.  5. HLD: LDL 113 from 11/2019 with no evidence of coronary artery calcium on prior coronary CTA in 04/2019.  Now with possible perfusion defect involving the LAD territory as above.  Pending cardiac cath results we may need to escalate her lipid therapy.  6. OSA: CPAP.  Disposition: F/u with Dr. Fletcher Anon or an APP in 2 weeks, and EP as directed.   Medication Adjustments/Labs and Tests Ordered: Current medicines are reviewed at length with the patient today.  Concerns regarding medicines are outlined above. Medication changes, Labs and Tests ordered today are summarized above and listed in the Patient Instructions accessible in Encounters.   Signed, Christell Faith, PA-C 03/25/2020 9:29 AM     [facility] [address] [place] [address] [place], [place] [address] [phone]

## 2020-03-23 NOTE — H&P (View-Only) (Signed)
Cardiology Office Note    Date:  03/25/2020   ID:  Lisa Fuentes, DOB [birth year], MRN [id number]  PCP:  Lisa Pink, MD  Cardiologist:  [name], MD  Electrophysiologist:  Virl Axe, MD   Chief Complaint: Abnormal Lexiscan MPI  History of Present Illness:   Lisa Fuentes is a 60 y.o. female with history of HFrEF secondary to NICM status post ICD in 2008 with generator change out in 05/2013, Covid infection diagnosed on 02/02/2020, HTN, HLD, hyperthyroidism status post radioactive iodine treatment on thyroid replacement therapy, and OSA on CPAP who presents for evaluation of ongoing intermittent chest pain and dyspnea following Covid infection.  She was diagnosed with CHF in early 2000.  LHC in 2003 showed no significant CAD.  Nuclear stress test in 01/2015 showed no evidence of ischemia EF of 49%.  Echo in 01/2017 showed an EF of 45 to 50%.  Escalation of GDMT has previously been limited with labile blood pressures.  Echo in 03/2019 showed a stable cardiomyopathy with an EF of 45 to 50%, mild LVH, grade 1 diastolic dysfunction, normal RV systolic function and ventricular cavity size, trivial mitral regurgitation, and an estimated right atrial pressure of 3 mmHg.  Coronary CTA in 04/2019 showed a calcium score of 0 with no evidence of CAD.  She was last seen in the office in 11/2019 and was doing well without any symptoms concerning for chest pain with stable exertional dyspnea.  She was under increased stress at home with the health of her brother.  She was diagnosed with Covid infection in early 01/2020 and did not require hospital admission.  She was fully vaccinated/boosted.  Following this, she was seen on 03/01/2020 with intermittent dyspnea, chest discomfort, and palpitations with stable weight.  In this setting, she underwent Lexiscan MPI on 03/21/2020 which showed a large defect of moderate severity present in the mid anterior, apical anterior, and apex location consistent with LAD  distribution ischemia with an EF of 30-44%, and was overall an intermediate risk scan.  Echo on 03/21/2020, showed an unchanged cardiomyopathy with an EF of 40-45%, unable to assess WMA, Gr1DD, normal RVSF and ventricular cavity size, no significant valvular abnormalities and an estimated right atrial pressure of 3 mmHg.   She comes in today to discuss her abnormal Myoview.  She is accompanied by her husband. Since she was last seen she feels about the same. She continues to note randomly occurring chest discomfort that is often described as a sharp sensation substernally and will last for several minutes followed by spontaneous resolution. She also notes a second type of chest discomfort that is a pressure and associated with exertion. With this there is some associated shortness of breath and there has been one episode of diaphoresis. She is currently chest pain-free.   Labs independently reviewed: 11/2019 - Hgb 13.7, PLT 179, potassium 4.3, BUN 15, serum creatinine 0.8, albumin 4.4, AST/ALT normal, TSH 5.373, free T4 normal, TC 171, TG 49, HDL 48, LDL 113 01/2019 - magnesium 2.1   Past Medical History:  Diagnosis Date  . Anemia   . Anxiety    panic attacks  . Arthritis   . Clotting disorder ([place])   . Depression   . Difficult intubation   . Endometriosis of vagina 09/2013   Intra-operative findings of endometriosis implants on cervical stump  . GERD (gastroesophageal reflux disease)   . Headache   . Hypercholesteremia   . Hypertension   . Hypokalemia   . Hypothyroidism   .  Implantable defibrillator    [employer] DOI 2008, replacement 2015  . Menopausal symptoms   . Nonischemic cardiomyopathy (HCC)    Interval improvement EF 40-45% 1/16  . Shortness of breath dyspnea    chronic doe  . Sleep apnea    Treated with CPAP  . Tachycardia     Past Surgical History:  Procedure Laterality Date  . ANTERIOR AND POSTERIOR REPAIR N/A 11/25/2017   Procedure: ANTERIOR (CYSTOCELE) AND  POSTERIOR REPAIR (RECTOCELE);  Surgeon: Rubie Maid, MD;  Location: [facility] ORS;  Service: Gynecology;  Laterality: N/A;  . BREAST CYST ASPIRATION Left 03/22/14   neg/ done by Dr Jamal Collin  . CARDIAC CATHETERIZATION  2003   [facility]: No significant coronary artery disease with reduced ejection fraction.  Marland Kitchen CARDIAC DEFIBRILLATOR PLACEMENT    . CARPAL TUNNEL RELEASE Right   . COLONOSCOPY  2015  . COLONOSCOPY WITH PROPOFOL N/A 10/04/2015   Procedure: COLONOSCOPY WITH PROPOFOL;  Surgeon: Lucilla Lame, MD;  Location: [facility] ENDOSCOPY;  Service: Endoscopy;  Laterality: N/A;  . CORONARY ANGIOPLASTY    . CYSTOSCOPY  11/15/2014   Procedure: CYSTOSCOPY;  Surgeon: Rubie Maid, MD;  Location: [facility] ORS;  Service: Gynecology;;  . FOOT SURGERY    . HAND SURGERY    . KNEE ARTHROSCOPY Left 03/02/2015   Procedure: ARTHROSCOPY  LEFT KNEE, PARTIAL LATERAL  MENISECTOMY, SYNOVECTOMY, MEDIAL & LATERAL CHONDROPLASTY;  Surgeon: Thornton Park, MD;  Location: [facility] ORS;  Service: Orthopedics;  Laterality: Left;  Dr. Mack Guise wanted her surgery switched from [facility] because of the Defibrillator.  patient has a Defibrillator  takes Asprin/knows to stop 5days before  has had some test done  . LAPAROSCOPIC SALPINGO OOPHERECTOMY Left 11/15/2014   Procedure: LAPAROSCOPIC OOPHORECTOMY;  Surgeon: Rubie Maid, MD;  Location: [facility] ORS;  Service: Gynecology;  Laterality: Left;  . LAPAROSCOPIC SALPINGOOPHERECTOMY Right 09/2013   also with left salpingectomy  . TONSILLECTOMY    . TOTAL ABDOMINAL HYSTERECTOMY    . TRACHELECTOMY N/A 11/15/2014   Procedure: TRACHELECTOMY;  Surgeon: Rubie Maid, MD;  Location: [facility] ORS;  Service: Gynecology;  Laterality: N/A;  . TUBAL LIGATION Bilateral     Current Medications: Current Meds  Medication Sig  . aspirin EC 81 MG tablet Take 1 tablet (81 mg total) by mouth daily.  . carvedilol (COREG) 25 MG tablet Take 1/2 (one-half) tablet by mouth twice daily  . cetirizine (ZYRTEC) 10 MG  tablet Take 10 mg by mouth daily.  . cyclobenzaprine (FLEXERIL) 10 MG tablet Take 10 mg by mouth 3 (three) times daily as needed for muscle spasms.  Marland Kitchen esomeprazole (NEXIUM) 40 MG capsule Take 40 mg by mouth every morning.   . fluticasone (FLONASE) 50 MCG/ACT nasal spray Place 1 spray into both nostrils daily.   . furosemide (LASIX) 40 MG tablet Take 40 mg by mouth daily. TAKE ONE TABLET BY MOUTH ONCE DAILY *PATIENT OCCASIONALLY TAKES AN EXTRA TABLET*  . levothyroxine (SYNTHROID, LEVOTHROID) 150 MCG tablet Take 150 mcg by mouth daily before breakfast.   . losartan (COZAAR) 25 MG tablet Take 0.5 tablets (12.5 mg total) by mouth 2 (two) times daily.  Marland Kitchen nystatin (MYCOSTATIN) powder Apply 1 Bottle topically 4 (four) times daily as needed (irritaton). Reported on 08/18/2015  . oxyCODONE-acetaminophen (PERCOCET/ROXICET) 5-325 MG tablet Take by mouth every 4 (four) hours as needed for severe pain.  . potassium chloride (KLOR-CON) 10 MEQ tablet Take 1 tablet (10 mEq total) by mouth daily.  . sertraline (ZOLOFT) 50 MG tablet Take 50 mg by  mouth daily.  Marland Kitchen spironolactone (ALDACTONE) 25 MG tablet Take 1/2 (one-half) tablet by mouth once daily    Allergies:   Sulfa antibiotics and Sulfamethoxazole-trimethoprim   Social History   Socioeconomic History  . Marital status: Divorced    Spouse name: Not on file  . Number of children: Not on file  . Years of education: Not on file  . Highest education level: Not on file  Occupational History  . Not on file  Tobacco Use  . Smoking status: Never Smoker  . Smokeless tobacco: Never Used  Vaping Use  . Vaping Use: Never used  Substance and Sexual Activity  . Alcohol use: Not Currently    Alcohol/week: 0.0 standard drinks    Comment: occassionally  . Drug use: No  . Sexual activity: Not Currently    Birth control/protection: None  Other Topics Concern  . Not on file  Social History Narrative  . Not on file   Social Determinants of Health   Financial  Resource Strain: Not on file  Food Insecurity: Not on file  Transportation Needs: Not on file  Physical Activity: Not on file  Stress: Not on file  Social Connections: Not on file     Family History:  The patient's family history includes Breast cancer (age of onset: 35) in her sister; Colon cancer in her mother; Hyperlipidemia in her mother; Hypertension in her mother; Ovarian cancer in her sister; Stroke in her mother; Throat cancer in her brother.  ROS:   Review of Systems  Constitutional: Positive for malaise/fatigue. Negative for chills, diaphoresis, fever and weight loss.  HENT: Negative for congestion.   Eyes: Negative for discharge and redness.  Respiratory: Positive for shortness of breath. Negative for cough, sputum production and wheezing.   Cardiovascular: Positive for chest pain. Negative for palpitations, orthopnea, claudication, leg swelling and PND.  Gastrointestinal: Negative for abdominal pain, heartburn, nausea and vomiting.  Musculoskeletal: Negative for falls and myalgias.  Skin: Negative for rash.  Neurological: Positive for weakness. Negative for dizziness, tingling, tremors, sensory change, speech change, focal weakness and loss of consciousness.  Endo/Heme/Allergies: Does not bruise/bleed easily.  Psychiatric/Behavioral: Negative for substance abuse. The patient is not nervous/anxious.   All other systems reviewed and are negative.    EKGs/Labs/Other Studies Reviewed:    Studies reviewed were summarized above. The additional studies were reviewed today:  2D Echo 01/2017: - Left ventricle: The cavity size was mildly dilated. There was mild concentric hypertrophy. Systolic function was mildly reduced. The estimated ejection fraction was in the range of 45% to 50%. Doppler parameters are consistent with abnormal left ventricular relaxation (grade 1 diastolic dysfunction). - Left atrium: The atrium was mildly dilated. - Pulmonary arteries: Systolic  pressure could not be accurately estimated. __________  2D Echo 03/24/2019: 1. Left ventricular ejection fraction, by estimation, is 45 to 50%. The  left ventricle has mildly decreased function. The left ventricle  demonstrates global hypokinesis. The left ventricular internal cavity size  was mildly dilated. There is mild left  ventricular hypertrophy. Left ventricular diastolic parameters are  consistent with Grade I diastolic dysfunction (impaired relaxation).  2. Right ventricular systolic function is normal. The right ventricular  size is normal. Tricuspid regurgitation signal is inadequate for assessing  PA pressure.  3. The mitral valve is normal in structure and function. Trivial mitral  valve regurgitation.  4. The aortic valve is tricuspid. Aortic valve regurgitation is not  visualized. No aortic stenosis is present.  5. Aortic dilatation noted. There  is borderline dilatation of the aortic  root measuring 37 mm.  6. Mildly dilated pulmonary artery.  7. The inferior vena cava is normal in size with greater than 50%  respiratory variability, suggesting right atrial pressure of 3 mmHg. __________  Coronary CTA 04/2019: IMPRESSION: 1. No evidence of CAD, CADRADS = 0. 2. Coronary calcium score of 0. 3. Normal coronary origin with right dominance. __________  Carlton Adam MPI 03/21/2020:  There was no ST segment deviation noted during stress.  No T wave inversion was noted during stress.  Defect 1: There is a large defect of moderate severity present in the mid anterior, apical anterior and apex location.  Findings consistent with ischemia in the LAD distribution.  This is an intermediate risk study.  The left ventricular ejection fraction is moderately decreased (30-44%).  __________  2D echo 03/21/2020: 1. Left ventricular ejection fraction, by estimation, is 40 to 45%. The  left ventricle has mildly decreased function. Left ventricular endocardial  border  not optimally defined to evaluate regional wall motion. Left  ventricular diastolic parameters are  consistent with Grade I diastolic dysfunction (impaired relaxation).  2. Right ventricular systolic function is normal. The right ventricular  size is normal.  3. The mitral valve is normal in structure. No evidence of mitral valve  regurgitation. No evidence of mitral stenosis.  4. The aortic valve is normal in structure. Aortic valve regurgitation is  not visualized. No aortic stenosis is present.  5. The inferior vena cava is normal in size with greater than 50%  respiratory variability, suggesting right atrial pressure of 3 mmHg.   EKG:  EKG is not ordered today given recent nuclear stress test.    Recent Labs: 04/10/2019: ALT 24; BUN 17; Creatinine, Ser 0.92; Hemoglobin 14.7; Platelets 158; Potassium 4.6; Sodium 137  Recent Lipid Panel No results found for: CHOL, TRIG, HDL, CHOLHDL, VLDL, LDLCALC, LDLDIRECT  PHYSICAL EXAM:    VS:  BP 110/80 (BP Location: Left Arm, Patient Position: Sitting, Cuff Size: Large)   Pulse 86   Ht 5\' 7"  (1.702 m)   Wt 257 lb (116.6 kg)   SpO2 98%   BMI 40.25 kg/m   BMI: Body mass index is 40.25 kg/m.  Physical Exam Vitals reviewed.  Constitutional:      Appearance: She is well-developed and well-nourished.  HENT:     Head: Normocephalic and atraumatic.  Eyes:     General:        Right eye: No discharge.        Left eye: No discharge.  Neck:     Vascular: No JVD.  Cardiovascular:     Rate and Rhythm: Normal rate and regular rhythm.     Pulses: No midsystolic click and no opening snap.          Posterior tibial pulses are 2+ on the right side and 2+ on the left side.     Heart sounds: Normal heart sounds, S1 normal and S2 normal. Heart sounds not distant. No murmur heard. No friction rub.  Pulmonary:     Effort: Pulmonary effort is normal. No respiratory distress.     Breath sounds: Normal breath sounds. No decreased breath sounds,  wheezing or rales.  Chest:     Chest wall: No tenderness.  Abdominal:     General: There is no distension.     Palpations: Abdomen is soft.     Tenderness: There is no abdominal tenderness.  Musculoskeletal:        General:  No edema.     Cervical back: Normal range of motion.  Skin:    General: Skin is warm and dry.     Nails: There is no clubbing or cyanosis.  Neurological:     Mental Status: She is alert and oriented to person, place, and time.  Psychiatric:        Mood and Affect: Mood and affect normal.        Speech: Speech normal.        Behavior: Behavior normal.        Thought Content: Thought content normal.        Judgment: Judgment normal.     Wt Readings from Last 3 Encounters:  03/25/20 257 lb (116.6 kg)  03/01/20 256 lb (116.1 kg)  12/08/19 259 lb 8 oz (117.7 kg)     ASSESSMENT & PLAN:   1. Unstable angina with abnormal Lexiscan MPI: Currently chest pain-free.  Interestingly, coronary CTA in 04/2019 showed a calcium score of 0 with no evidence of CAD.  She was seen recently in the office for chest pain after having had Covid.  In this setting she underwent Lexiscan MPI which showed a perfusion defect in the LAD territory concerning for ischemia.  Schedule LHC with Dr. Fletcher Anon.  Continue ASA, carvedilol, and losartan.  Escalate lipid therapy as indicated based on cath results.  Risks and benefits of cardiac catheterization have been discussed with the patient including risks of bleeding, bruising, infection, kidney damage, stroke, heart attack, urgent need for bypass, injury to limb, and death. The patient understands these risks and is willing to proceed with the procedure. All questions have been answered and concerns listened to.   2. HFrEF s/p ICD: She appears euvolemic and well compensated with NYHA class II symptoms.  Recent echo demonstrated a stable EF of 45%.  Continue current GDMT including carvedilol, losartan, spironolactone, and furosemide.  Consider addition  of SGLT2 inhibitor and follow-up after her cath.  3. NSVT: Recent coronary CTA without evidence of CAD with baseline EF stable at 45%.  She is already status post ICD.  With previously noted palpitations and inability to transmit a remote interrogation Zio patch was placed and is ending at this time.  She remains on carvedilol.  4. HTN: Blood pressure is well controlled.  Continue current medications as outlined above.  5. HLD: LDL 113 from 11/2019 with no evidence of coronary artery calcium on prior coronary CTA in 04/2019.  Now with possible perfusion defect involving the LAD territory as above.  Pending cardiac cath results we may need to escalate her lipid therapy.  6. OSA: CPAP.  Disposition: F/u with Dr. Fletcher Anon or an APP in 2 weeks, and EP as directed.   Medication Adjustments/Labs and Tests Ordered: Current medicines are reviewed at length with the patient today.  Concerns regarding medicines are outlined above. Medication changes, Labs and Tests ordered today are summarized above and listed in the Patient Instructions accessible in Encounters.   Signed, Christell Faith, PA-C 03/25/2020 9:29 AM     [facility] [address] [place] [address] [place], [place] [address] [phone]

## 2020-03-25 ENCOUNTER — Ambulatory Visit: Payer: Medicare HMO | Admitting: Physician Assistant

## 2020-03-25 ENCOUNTER — Other Ambulatory Visit: Payer: Self-pay | Admitting: Physician Assistant

## 2020-03-25 ENCOUNTER — Other Ambulatory Visit: Payer: Self-pay

## 2020-03-25 ENCOUNTER — Other Ambulatory Visit
Admission: RE | Admit: 2020-03-25 | Discharge: 2020-03-25 | Disposition: A | Discharge status: Home / Self Care | Payer: Medicare HMO | Source: Ambulatory Visit | Attending: Physician Assistant | Admitting: Physician Assistant

## 2020-03-25 ENCOUNTER — Telehealth: Payer: Self-pay | Admitting: *Deleted

## 2020-03-25 ENCOUNTER — Other Ambulatory Visit
Admission: RE | Admit: 2020-03-25 | Discharge: 2020-03-25 | Disposition: A | Discharge status: Home / Self Care | Payer: Medicare HMO | Source: Ambulatory Visit | Attending: Cardiovascular Disease | Admitting: Cardiovascular Disease

## 2020-03-25 ENCOUNTER — Encounter: Payer: Self-pay | Admitting: Physician Assistant

## 2020-03-25 VITALS — BP 110/80 | HR 86 | Ht 67.0 in | Wt 257.0 lb

## 2020-03-25 DIAGNOSIS — E782 Mixed hyperlipidemia: Secondary | ICD-10-CM | POA: Diagnosis not present

## 2020-03-25 DIAGNOSIS — R9439 Abnormal result of other cardiovascular function study: Secondary | ICD-10-CM | POA: Diagnosis not present

## 2020-03-25 DIAGNOSIS — I4729 Other ventricular tachycardia: Secondary | ICD-10-CM

## 2020-03-25 DIAGNOSIS — Z01812 Encounter for preprocedural laboratory examination: Secondary | ICD-10-CM

## 2020-03-25 DIAGNOSIS — I472 Ventricular tachycardia: Secondary | ICD-10-CM | POA: Diagnosis not present

## 2020-03-25 DIAGNOSIS — I10 Essential (primary) hypertension: Secondary | ICD-10-CM | POA: Diagnosis not present

## 2020-03-25 DIAGNOSIS — Z95810 Presence of automatic (implantable) cardiac defibrillator: Secondary | ICD-10-CM

## 2020-03-25 DIAGNOSIS — I200 Unstable angina: Secondary | ICD-10-CM | POA: Diagnosis not present

## 2020-03-25 DIAGNOSIS — I5022 Chronic systolic (congestive) heart failure: Secondary | ICD-10-CM

## 2020-03-25 DIAGNOSIS — G4730 Sleep apnea, unspecified: Secondary | ICD-10-CM

## 2020-03-25 LAB — CBC
HCT: 41.3 % (ref 36.0–46.0)
Hemoglobin: 13.5 g/dL (ref 12.0–15.0)
MCH: 31.3 pg (ref 26.0–34.0)
MCHC: 32.7 g/dL (ref 30.0–36.0)
MCV: 95.8 fL (ref 80.0–100.0)
Platelets: 157 10*3/uL (ref 150–400)
RBC: 4.31 MIL/uL (ref 3.87–5.11)
RDW: 13.7 % (ref 11.5–15.5)
WBC: 4.3 10*3/uL (ref 4.0–10.5)
nRBC: 0 % (ref 0.0–0.2)

## 2020-03-25 LAB — BASIC METABOLIC PANEL
Anion gap: 6 (ref 5–15)
BUN: 18 mg/dL (ref 6–20)
CO2: 28 mmol/L (ref 22–32)
Calcium: 9.4 mg/dL (ref 8.9–10.3)
Chloride: 107 mmol/L (ref 98–111)
Creatinine, Ser: 0.86 mg/dL (ref 0.44–1.00)
GFR, Estimated: >60 mL/min (ref >60)
Glucose, Bld: 95 mg/dL (ref 70–99)
Potassium: 4.1 mmol/L (ref 3.5–5.1)
Sodium: 141 mmol/L (ref 135–145)

## 2020-03-25 NOTE — H&P (View-Only) (Signed)
Orders for LHC have been placed.

## 2020-03-25 NOTE — Progress Notes (Signed)
Arrived at the covid testing site. Had tested positive for covid on 02/02/2020 with evidence shown in care everywhere labs. Per policy, the patient does not need a repeat covid test prior to upcoming procedure.

## 2020-03-25 NOTE — Telephone Encounter (Signed)
Reviewed lab results were normal with patient and inquired if she went for her COVID swab. She stated that since she had COVID they told her at drive thru testing site it was not needed. She was appreciative for the call with no further questions at this time.

## 2020-03-25 NOTE — Telephone Encounter (Signed)
-----   Message from Rise Mu, PA-C sent at 03/25/2020 10:19 AM EST ----- Precath labs okay.

## 2020-03-25 NOTE — Progress Notes (Signed)
Orders for LHC have been placed.

## 2020-03-25 NOTE — Patient Instructions (Addendum)
Medication Instructions:  No changes  *If you need a refill on your cardiac medications before your next appointment, please call your pharmacy*   Lab Work: CBC, BMP & COVID swab as listed below.   If you have labs (blood work) drawn today and your tests are completely normal, you will receive your results only by: Marland Kitchen MyChart Message (if you have MyChart) OR . A paper copy in the mail If you have any lab test that is abnormal or we need to change your treatment, we will call you to review the results.   Testing/Procedures: [facility] Cardiac Cath Instructions   You are scheduled for a Cardiac Cath on: Monday February 28 th with Dr. Fletcher Anon  Please arrive at 08:30 am on the day of your procedure   Please expect a call from our [place] to pre-register you  Do not eat/drink anything after midnight  Someone will need to drive you home  It is recommended someone be with you for the first 24 hours after your procedure  Wear clothes that are easy to get on/off and wear slip on shoes if possible   Medications bring a current list of all medications with you  _XX__ Do not take these medications before your procedure: Furosemide & Spironolactone the morning of your procedure.  _XX__ You may take all of your medications the morning of your procedure with enough water to swallow safely   Day of your procedure: Arrive at the [facility] entrance.  Free valet service is available.  After entering the [facility] please check-in at the registration desk (1st desk on your right) to receive your armband. After receiving your armband someone will escort you to the cardiac cath/special procedures waiting area.  The usual length of stay after your procedure is about 2 to 3 hours.  This can vary.  If you have any questions, please call our office at [phone], or you may call the cardiac cath lab at [facility] directly at [place]- TEST: You will need a  COVID TEST prior to the procedure:  LOCATION: [facility]-Thru Testing site.  DATE/TIME:  TODAY (8:00 am- 1:00 pm)    Follow-Up: At [facility], you and your health needs are our priority.  As part of our continuing mission to provide you with exceptional heart care, we have created designated Provider Care Teams.  These Care Teams include your primary Cardiologist (physician) and Advanced Practice Providers (APPs -  Physician Assistants and Nurse Practitioners) who all work together to provide you with the care you need, when you need it.  We recommend signing up for the patient portal called "MyChart".  Sign up information is provided on this After Visit Summary.  MyChart is used to connect with patients for Virtual Visits (Telemedicine).  Patients are able to view lab/test results, encounter notes, upcoming appointments, etc.  Non-urgent messages can be sent to your provider as well.   To learn more about what you can do with MyChart, go to [email or web address].    Your next appointment:   2 week(s)  The format for your next appointment:   In Person  Provider:   [name], MD or Christell Faith, PA-C

## 2020-03-28 ENCOUNTER — Encounter: Payer: Self-pay | Admitting: Cardiovascular Disease

## 2020-03-28 ENCOUNTER — Ambulatory Visit
Admission: RE | Admit: 2020-03-28 | Discharge: 2020-03-28 | Disposition: A | Discharge status: Home / Self Care | Payer: Medicare HMO | Attending: Cardiovascular Disease | Admitting: Cardiovascular Disease

## 2020-03-28 ENCOUNTER — Other Ambulatory Visit: Payer: Self-pay

## 2020-03-28 ENCOUNTER — Encounter
Admission: RE | Disposition: A | Discharge status: Home / Self Care | Payer: Medicare HMO | Source: Home / Self Care | Attending: Cardiovascular Disease

## 2020-03-28 ENCOUNTER — Other Ambulatory Visit: Payer: Self-pay | Admitting: Cardiovascular Disease

## 2020-03-28 DIAGNOSIS — I428 Other cardiomyopathies: Secondary | ICD-10-CM | POA: Diagnosis not present

## 2020-03-28 DIAGNOSIS — R9439 Abnormal result of other cardiovascular function study: Secondary | ICD-10-CM

## 2020-03-28 HISTORY — PX: LEFT HEART CATH AND CORONARY ANGIOGRAPHY: CATH118249

## 2020-03-28 SURGERY — LEFT HEART CATH AND CORONARY ANGIOGRAPHY
Anesthesia: Moderate Sedation

## 2020-03-28 MED ORDER — SODIUM CHLORIDE 0.9% FLUSH: 3.0000 mL | Freq: Two times a day (BID) | INTRAVENOUS | Status: DC

## 2020-03-28 MED ORDER — HEPARIN SODIUM (PORCINE) 1000 UNIT/ML IJ SOLN
INTRAMUSCULAR | Status: AC
  Filled 2020-03-28: qty 1

## 2020-03-28 MED ORDER — IOHEXOL 300 MG/ML  SOLN
INTRAMUSCULAR | Status: DC | PRN
  Administered 2020-03-28: 25 mL

## 2020-03-28 MED ORDER — HEPARIN (PORCINE) IN NACL 1000-0.9 UT/500ML-% IV SOLN
INTRAVENOUS | Status: AC
  Filled 2020-03-28: qty 1000

## 2020-03-28 MED ORDER — FENTANYL CITRATE (PF) 100 MCG/2ML IJ SOLN
INTRAMUSCULAR | Status: DC | PRN
  Administered 2020-03-28: 25 ug via INTRAVENOUS

## 2020-03-28 MED ORDER — ONDANSETRON HCL 4 MG/2ML IJ SOLN: 4.0000 mg | Freq: Four times a day (QID) | INTRAMUSCULAR | Status: DC | PRN

## 2020-03-28 MED ORDER — FENTANYL CITRATE (PF) 100 MCG/2ML IJ SOLN
INTRAMUSCULAR | Status: AC
  Filled 2020-03-28: qty 2

## 2020-03-28 MED ORDER — SODIUM CHLORIDE 0.9% FLUSH: 3.0000 mL | INTRAVENOUS | Status: DC | PRN

## 2020-03-28 MED ORDER — VERAPAMIL HCL 2.5 MG/ML IV SOLN
INTRAVENOUS | Status: DC | PRN
  Administered 2020-03-28: 2.5 mg via INTRAVENOUS

## 2020-03-28 MED ORDER — MIDAZOLAM HCL 2 MG/2ML IJ SOLN
INTRAMUSCULAR | Status: DC | PRN
  Administered 2020-03-28: 1 mg via INTRAVENOUS

## 2020-03-28 MED ORDER — SODIUM CHLORIDE 0.9 % IV SOLN: INTRAVENOUS | Status: DC

## 2020-03-28 MED ORDER — VERAPAMIL HCL 2.5 MG/ML IV SOLN
INTRAVENOUS | Status: AC
  Filled 2020-03-28: qty 2

## 2020-03-28 MED ORDER — SODIUM CHLORIDE 0.9 % IV SOLN: 250.0000 mL | INTRAVENOUS | Status: DC | PRN

## 2020-03-28 MED ORDER — HEPARIN (PORCINE) IN NACL 1000-0.9 UT/500ML-% IV SOLN
INTRAVENOUS | Status: DC | PRN
Start: 2020-03-28 — End: 2020-03-28
  Administered 2020-03-28: 500 mL

## 2020-03-28 MED ORDER — HEPARIN SODIUM (PORCINE) 1000 UNIT/ML IJ SOLN
INTRAMUSCULAR | Status: DC | PRN
  Administered 2020-03-28: 5000 [IU] via INTRAVENOUS

## 2020-03-28 MED ORDER — MIDAZOLAM HCL 2 MG/2ML IJ SOLN
INTRAMUSCULAR | Status: AC
  Filled 2020-03-28: qty 2

## 2020-03-28 MED ORDER — LIDOCAINE HCL (PF) 1 % IJ SOLN
INTRAMUSCULAR | Status: AC
  Filled 2020-03-28: qty 30

## 2020-03-28 MED ORDER — SODIUM CHLORIDE 0.9 % IV SOLN
250.0000 mL | INTRAVENOUS | Status: DC | PRN
Start: 2020-03-28 — End: 2020-03-28

## 2020-03-28 MED ORDER — LIDOCAINE HCL (PF) 1 % IJ SOLN
INTRAMUSCULAR | Status: DC | PRN
  Administered 2020-03-28: 2 mL

## 2020-03-28 MED ORDER — ACETAMINOPHEN 325 MG PO TABS: 650.0000 mg | ORAL_TABLET | ORAL | Status: DC | PRN

## 2020-03-28 SURGICAL SUPPLY — 7 items
CATH INFINITI 5FR JK (CATHETERS) ×2 IMPLANT
DEVICE RAD TR BAND REGULAR (VASCULAR PRODUCTS) ×2 IMPLANT
GLIDESHEATH SLEND SS 6F .021 (SHEATH) ×2 IMPLANT
GUIDEWIRE INQWIRE 1.5J.035X260 (WIRE) ×1 IMPLANT
INQWIRE 1.5J .035X260CM (WIRE) ×2
KIT MANI 3VAL PERCEP (MISCELLANEOUS) ×2 IMPLANT
PACK CARDIAC CATH (CUSTOM PROCEDURE TRAY) ×2 IMPLANT

## 2020-03-28 NOTE — Interval H&P Note (Signed)
Cath Lab Visit (complete for each Cath Lab visit)  Clinical Evaluation Leading to the Procedure:   ACS: No.  Non-ACS:    Anginal Classification: CCS III  Anti-ischemic medical therapy: Maximal Therapy (2 or more classes of medications)  Non-Invasive Test Results: Intermediate-risk stress test findings: cardiac mortality 1-3%/year  Prior CABG: No previous CABG      History and Physical Interval Note:  03/28/2020 10:15 AM  Lisa Fuentes  has presented today for surgery, with the diagnosis of LT Heart Cath   Abnormal cardiac stress test.  The various methods of treatment have been discussed with the patient and family. After consideration of risks, benefits and other options for treatment, the patient has consented to  Procedure(s): LEFT HEART CATH AND CORONARY ANGIOGRAPHY (N/A) as a surgical intervention.  The patient's history has been reviewed, patient examined, no change in status, stable for surgery.  I have reviewed the patient's chart and labs.  Questions were answered to the patient's satisfaction.     [name]

## 2020-03-30 DIAGNOSIS — I471 Supraventricular tachycardia, unspecified: Secondary | ICD-10-CM

## 2020-03-30 DIAGNOSIS — R079 Chest pain, unspecified: Secondary | ICD-10-CM | POA: Diagnosis not present

## 2020-03-30 HISTORY — DX: Supraventricular tachycardia: I47.1

## 2020-03-30 HISTORY — DX: Supraventricular tachycardia, unspecified: I47.10

## 2020-03-31 NOTE — Interval H&P Note (Signed)
History and Physical Interval Note:  03/31/2020 1:04 PM  Lisa Fuentes  has presented today for surgery, with the diagnosis of LT Heart Cath   Abnormal cardiac stress test.  The various methods of treatment have been discussed with the patient and family. After consideration of risks, benefits and other options for treatment, the patient has consented to  Procedure(s): LEFT HEART CATH AND CORONARY ANGIOGRAPHY (N/A) as a surgical intervention.  The patient's history has been reviewed, patient examined, no change in status, stable for surgery.  I have reviewed the patient's chart and labs.  Questions were answered to the patient's satisfaction.     [name]

## 2020-04-04 DIAGNOSIS — Z6841 Body Mass Index (BMI) 40.0 and over, adult: Secondary | ICD-10-CM | POA: Diagnosis not present

## 2020-04-04 DIAGNOSIS — F339 Major depressive disorder, recurrent, unspecified: Secondary | ICD-10-CM | POA: Diagnosis not present

## 2020-04-04 DIAGNOSIS — I10 Essential (primary) hypertension: Secondary | ICD-10-CM | POA: Diagnosis not present

## 2020-04-04 DIAGNOSIS — E782 Mixed hyperlipidemia: Secondary | ICD-10-CM | POA: Diagnosis not present

## 2020-04-04 DIAGNOSIS — E039 Hypothyroidism, unspecified: Secondary | ICD-10-CM | POA: Diagnosis not present

## 2020-04-04 DIAGNOSIS — I420 Dilated cardiomyopathy: Secondary | ICD-10-CM | POA: Diagnosis not present

## 2020-04-08 ENCOUNTER — Ambulatory Visit: Payer: Medicare HMO | Admitting: Physician Assistant

## 2020-04-10 NOTE — Progress Notes (Unsigned)
Cardiology Office Note    Date:  04/11/2020   ID:  Lisa Fuentes, DOB January 04, 1961, MRN [id number]  PCP:  Maryland Pink, MD  Cardiologist:  [name], MD  Electrophysiologist:  Virl Axe, MD   Chief Complaint: Cath follow up  History of Present Illness:   Lisa Fuentes is a 60 y.o. female with history of normal coronary arteries by LHC in 03/2020, HFrEF secondary toNICMstatus post ICD in 2008 with generator change out in 05/2013, Covid infection diagnosed on 02/02/2020, HTN, HLD, hyperthyroidism status post radioactive iodine treatment on thyroid replacement therapy, and OSA on CPAP who presents for follow-up of LHC.  She was diagnosed with CHF in early 2000. LHC in 2003 showed no significant CAD. Nuclear stress test in 01/2015 showed no evidence of ischemia EF of 49%. Echo in 01/2017 showed an EF of 45 to 50%. Escalation of GDMT has previously been limited with labile blood pressures. Echo in 03/2019 showed a stable cardiomyopathy with an EF of 45 to 50%, mild LVH, grade 1 diastolic dysfunction, normal RV systolic function and ventricular cavity size, trivial mitral regurgitation, and an estimated right atrial pressure of 3 mmHg. Coronary CTA in 04/2019 showed a calcium score of 0 with no evidence of CAD. She was seen in the office in 11/2019 and was doing well without any symptoms concerning for chest pain with stable exertional dyspnea. She was under increased stress at home with the healthof her brother.  She was diagnosed with Covid infection in early 01/2020 and did not require hospital admission. She was fully vaccinated/boosted.  Following this, she was seen on 03/01/2020 with intermittent dyspnea, chest discomfort, and palpitations with stable weight.  In this setting, she underwent Lexiscan MPI on 03/21/2020 which showed a large defect of moderate severity present in the mid anterior, apical anterior, and apex location consistent with LAD distribution ischemia with an EF of  30-44%, and was overall an intermediate risk scan.  Echo on 03/21/2020, showed an unchanged cardiomyopathy with an EF of 40-45%, unable to assess WMA, Gr1DD, normal RVSF and ventricular cavity size, no significant valvular abnormalities and an estimated right atrial pressure of 3 mmHg.  Given her abnormal Myoview, she underwent diagnostic LHC on 03/28/2020 which showed normal coronary arteries and a mildly elevated LVEDP at 20 mmHg.  Continued medical therapy for nonischemic cardiomyopathy was recommended.  Her stress test was a false positive.  Subsequent outpatient cardiac monitoring returned which showed a predominant rhythm of sinus with 2 episodes of SVT noted with the longest interval lasting just 4 beats.  Rare PACs and PVCs.  No NSVT or VT.  Most patient triggered events did not correlate with arrhythmia other than sinus tachycardia.  She comes in doing well from a cardiac perspective.  Since she was last seen she has noted resolution of her chest discomfort and underlying dyspnea.  She does continue to note intermittent palpitations though she reports she feels like this is due to her thyroid as her recent free T4 was elevated.  She is working with her PCP for this.  With regards to her cardiac medications, she has been taking carvedilol 12.5 mg once daily rather than twice daily, Lasix 20 mg daily rather than 40 mg daily, and losartan 12.5 mg daily rather than twice daily.  She is taking spironolactone 12.5 mg daily as directed.  Her weight remains stable.  No lower extremity swelling, orthopnea, PND, or early satiety.  No issues from her right radial cardiac cath site.  Labs independently reviewed: 03/2020 - Hgb 13.5, PLT 157, potassium 4.1, BUN 18, serum creatinine 0.86 11/2019-albumin 4.4, AST/ALT normal, TSH 5.373, free T4 normal, TC 171, TG 49, HDL 48, LDL 113 01/2019-magnesium 2.1   Past Medical History:  Diagnosis Date  . Anemia   . Anxiety    panic attacks  . Arthritis   .  Clotting disorder ([place])   . Depression   . Difficult intubation   . Endometriosis of vagina 09/2013   Intra-operative findings of endometriosis implants on cervical stump  . GERD (gastroesophageal reflux disease)   . Headache   . Hypercholesteremia   . Hypertension   . Hypokalemia   . Hypothyroidism   . Implantable defibrillator    [employer] DOI 2008, replacement 2015  . Menopausal symptoms   . Nonischemic cardiomyopathy (HCC)    Interval improvement EF 40-45% 1/16  . Shortness of breath dyspnea    chronic doe  . Sleep apnea    Treated with CPAP  . Tachycardia     Past Surgical History:  Procedure Laterality Date  . ANTERIOR AND POSTERIOR REPAIR N/A 11/25/2017   Procedure: ANTERIOR (CYSTOCELE) AND POSTERIOR REPAIR (RECTOCELE);  Surgeon: Rubie Maid, MD;  Location: [facility] ORS;  Service: Gynecology;  Laterality: N/A;  . BREAST CYST ASPIRATION Left 03/22/14   neg/ done by Dr Jamal Collin  . CARDIAC CATHETERIZATION  2003   [facility]: No significant coronary artery disease with reduced ejection fraction.  Marland Kitchen CARDIAC DEFIBRILLATOR PLACEMENT    . CARPAL TUNNEL RELEASE Right   . COLONOSCOPY  2015  . COLONOSCOPY WITH PROPOFOL N/A 10/04/2015   Procedure: COLONOSCOPY WITH PROPOFOL;  Surgeon: Lucilla Lame, MD;  Location: [facility] END[facility];  Service: Endoscopy;  Laterality: N/A;  . CORONARY ANGIOPLASTY    . CYSTOSCOPY  11/15/2014   Procedure: CYSTOSCOPY;  Surgeon: Rubie Maid, MD;  Location: [facility] ORS;  Service: Gynecology;;  . FOOT SURGERY    . HAND SURGERY    . KNEE ARTHROSCOPY Left 03/02/2015   Procedure: ARTHROSCOPY  LEFT KNEE, PARTIAL LATERAL  MENISECTOMY, SYNOVECTOMY, MEDIAL & LATERAL CHONDROPLASTY;  Surgeon: Thornton Park, MD;  Location: [facility] OR[facility];  Service: Orthopedics;  Laterality: Left;  Dr. Mack Guise wanted her surgery switched from [facility] because of the Defibrillator.  patient has a Defibrillator  takes Asprin/knows to stop 5days before  has had some test done  . LAPAROSCOPIC  SALPINGO OOPHERECTOMY Left 11/15/2014   Procedure: LAPAROSCOPIC OOPHORECTOMY;  Surgeon: Rubie Maid, MD;  Location: [facility] ORS;  Service: Gynecology;  Laterality: Left;  . LAPAROSCOPIC SALPINGOOPHERECTOMY Right 09/2013   also with left salpingectomy  . LEFT HEART CATH AND CORONARY ANGIOGRAPHY N/A 03/28/2020   Procedure: LEFT HEART CATH AND CORONARY ANGIOGRAPHY;  Surgeon: Wellington Hampshire, MD;  Location: [place] [facility];  Service: Cardiovascular;  Laterality: N/A;  . TONSILLECTOMY    . TOTAL ABDOMINAL HYSTERECTOMY    . TRACHELECTOMY N/A 11/15/2014   Procedure: TRACHELECTOMY;  Surgeon: Rubie Maid, MD;  Location: [facility] OR[facility];  Service: Gynecology;  Laterality: N/A;  . TUBAL LIGATION Bilateral     Current Medications: Current Meds  Medication Sig  . aspirin EC 81 MG tablet Take 1 tablet (81 mg total) by mouth daily.  . cetirizine (ZYRTEC) 10 MG tablet Take 10 mg by mouth daily.  . cyclobenzaprine (FLEXERIL) 10 MG tablet Take 10 mg by mouth 3 (three) times daily as needed for muscle spasms.  Marland Kitchen esomeprazole (NEXIUM) 40 MG capsule Take 40 mg by mouth every morning.   Arna Medici  137 MCG tablet Take 137 mcg by mouth every morning.  . fluticasone (FLONASE) 50 MCG/ACT nasal spray Place 1 spray into both nostrils daily.   . metoprolol succinate (TOPROL XL) 50 MG 24 hr tablet Take 1 tablet (50 mg total) by mouth daily. Take with or immediately following a meal.  . nystatin (MYCOSTATIN) powder Apply 1 Bottle topically 4 (four) times daily as needed (irritaton). Reported on 08/18/2015  . nystatin cream (MYCOSTATIN) Apply topically 2 (two) times daily.  Marland Kitchen oxyCODONE-acetaminophen (PERCOCET/ROXICET) 5-325 MG tablet Take by mouth every 4 (four) hours as needed for severe pain.  . potassium chloride (KLOR-CON) 10 MEQ tablet Take 1 tablet (10 mEq total) by mouth daily.  . sertraline (ZOLOFT) 100 MG tablet Take 100 mg by mouth daily.  . sertraline (ZOLOFT) 50 MG tablet Take 50 mg by mouth daily.  Marland Kitchen  spironolactone (ALDACTONE) 25 MG tablet Take 1/2 (one-half) tablet by mouth once daily  . [DISCONTINUED] carvedilol (COREG) 25 MG tablet TAKE ONE-HALF TABLET BY MOUTH TWICE DAILY  . [DISCONTINUED] furosemide (LASIX) 40 MG tablet Take 40 mg by mouth daily. TAKE ONE TABLET BY MOUTH ONCE DAILY *PATIENT OCCASIONALLY TAKES AN EXTRA TABLET*  . [DISCONTINUED] losartan (COZAAR) 25 MG tablet Take 0.5 tablets (12.5 mg total) by mouth 2 (two) times daily.    Allergies:   Sulfa antibiotics and Sulfamethoxazole-trimethoprim   Social History   Socioeconomic History  . Marital status: Divorced    Spouse name: Not on file  . Number of children: Not on file  . Years of education: Not on file  . Highest education level: Not on file  Occupational History  . Not on file  Tobacco Use  . Smoking status: Never Smoker  . Smokeless tobacco: Never Used  Vaping Use  . Vaping Use: Never used  Substance and Sexual Activity  . Alcohol use: Yes    Alcohol/week: 0.0 standard drinks    Comment: occassionally  . Drug use: No  . Sexual activity: Not on file  Other Topics Concern  . Not on file  Social History Narrative  . Not on file   Social Determinants of Health   Financial Resource Strain: Not on file  Food Insecurity: Not on file  Transportation Needs: Not on file  Physical Activity: Not on file  Stress: Not on file  Social Connections: Not on file     Family History:  The patient's family history includes Breast cancer (age of onset: 15) in her sister; Colon cancer in her mother; Hyperlipidemia in her mother; Hypertension in her mother; Ovarian cancer in her sister; Stroke in her mother; Throat cancer in her brother.  ROS:   Review of Systems  Constitutional: Positive for malaise/fatigue. Negative for chills, diaphoresis, fever and weight loss.  HENT: Negative for congestion.   Eyes: Negative for discharge and redness.  Respiratory: Negative for cough, sputum production, shortness of breath  and wheezing.   Cardiovascular: Negative for chest pain, palpitations, orthopnea, claudication, leg swelling and PND.  Gastrointestinal: Negative for abdominal pain, heartburn, nausea and vomiting.  Musculoskeletal: Negative for falls and myalgias.  Skin: Negative for rash.  Neurological: Negative for dizziness, tingling, tremors, sensory change, speech change, focal weakness, loss of consciousness and weakness.  Endo/Heme/Allergies: Does not bruise/bleed easily.  Psychiatric/Behavioral: Negative for substance abuse. The patient is not nervous/anxious.   All other systems reviewed and are negative.    EKGs/Labs/Other Studies Reviewed:    Studies reviewed were summarized above. The additional studies were reviewed today:  2D Echo 01/2017: - Left ventricle: The cavity size was mildly dilated. There was mild concentric hypertrophy. Systolic function was mildly reduced. The estimated ejection fraction was in the range of 45% to 50%. Doppler parameters are consistent with abnormal left ventricular relaxation (grade 1 diastolic dysfunction). - Left atrium: The atrium was mildly dilated. - Pulmonary arteries: Systolic pressure could not be accurately estimated. __________  2D Echo 03/24/2019: 1. Left ventricular ejection fraction, by estimation, is 45 to 50%. The  left ventricle has mildly decreased function. The left ventricle  demonstrates global hypokinesis. The left ventricular internal cavity size  was mildly dilated. There is mild left  ventricular hypertrophy. Left ventricular diastolic parameters are  consistent with Grade I diastolic dysfunction (impaired relaxation).  2. Right ventricular systolic function is normal. The right ventricular  size is normal. Tricuspid regurgitation signal is inadequate for assessing  PA pressure.  3. The mitral valve is normal in structure and function. Trivial mitral  valve regurgitation.  4. The aortic valve is tricuspid. Aortic  valve regurgitation is not  visualized. No aortic stenosis is present.  5. Aortic dilatation noted. There is borderline dilatation of the aortic  root measuring 37 mm.  6. Mildly dilated pulmonary artery.  7. The inferior vena cava is normal in size with greater than 50%  respiratory variability, suggesting right atrial pressure of 3 mmHg. __________  Coronary CTA 04/2019: IMPRESSION: 1. No evidence of CAD, CADRADS = 0. 2. Coronary calcium score of 0. 3. Normal coronary origin with right dominance. __________  Carlton Adam MPI 03/21/2020:  There was no ST segment deviation noted during stress.  No T wave inversion was noted during stress.  Defect 1: There is a large defect of moderate severity present in the mid anterior, apical anterior and apex location.  Findings consistent with ischemia in the LAD distribution.  This is an intermediate risk study.  The left ventricular ejection fraction is moderately decreased (30-44%).  __________  2D echo 03/21/2020: 1. Left ventricular ejection fraction, by estimation, is 40 to 45%. The  left ventricle has mildly decreased function. Left ventricular endocardial  border not optimally defined to evaluate regional wall motion. Left  ventricular diastolic parameters are  consistent with Grade I diastolic dysfunction (impaired relaxation).  2. Right ventricular systolic function is normal. The right ventricular  size is normal.  3. The mitral valve is normal in structure. No evidence of mitral valve  regurgitation. No evidence of mitral stenosis.  4. The aortic valve is normal in structure. Aortic valve regurgitation is  not visualized. No aortic stenosis is present.  5. The inferior vena cava is normal in size with greater than 50%  respiratory variability, suggesting right atrial pressure of 3 mmHg. __________  LHC 03/2020: 1.  Normal coronary arteries. 2.  Left ventricular angiography was not performed.  EF was mildly reduced  by echo. 3.  Mildly elevated left ventricular end-diastolic pressure 20 mmHg.  Recommendations: False positive nuclear stress test. Recommend continuing medical therapy for nonischemic cardiomyopathy. __________  Elwyn Reach patch 03/2020: Patient had a min HR of 53 bpm, max HR of 184 bpm, and avg HR of 85 bpm. Predominant underlying rhythm was Sinus Rhythm. Possible Atrial and Ventricular Pacing was present. 2 Supraventricular Tachycardia runs occurred, the run with the fastest interval lasting 4 beats with a max rate of 184 bpm, the longest lasting 4 beats with an avg rate of 113 bpm. Rare PACs and rare PVCs. Most triggered events did not correlate with arrhythmia other  than sinus tachycardia.   EKG:  EKG is ordered today.  The EKG ordered today demonstrates NSR, 76 bpm, nonspecific ST-T changes  Recent Labs: 03/25/2020: BUN 18; Creatinine, Ser 0.86; Hemoglobin 13.5; Platelets 157; Potassium 4.1; Sodium 141  Recent Lipid Panel No results found for: CHOL, TRIG, HDL, CHOLHDL, VLDL, LDLCALC, LDLDIRECT  PHYSICAL EXAM:    VS:  BP 102/76 (BP Location: Left Arm, Patient Position: Sitting, Cuff Size: Normal)   Pulse 76   Ht 5\' 7"  (1.702 m)   Wt 258 lb (117 kg)   SpO2 96%   BMI 40.41 kg/m   BMI: Body mass index is 40.41 kg/m.  Physical Exam Vitals reviewed.  Constitutional:      Appearance: She is well-developed.  HENT:     Head: Normocephalic and atraumatic.  Eyes:     General:        Right eye: No discharge.        Left eye: No discharge.  Neck:     Vascular: No JVD.  Cardiovascular:     Rate and Rhythm: Normal rate and regular rhythm.     Pulses: No midsystolic click and no opening snap.          Posterior tibial pulses are 2+ on the right side and 2+ on the left side.     Heart sounds: Normal heart sounds, S1 normal and S2 normal. Heart sounds not distant. No murmur heard. No friction rub.     Comments: Right radial cardiac cath site is well-healing without active bleeding,  bruising, swelling, warmth, erythema, or tenderness to palpation.  Radial pulse 2+. Pulmonary:     Effort: Pulmonary effort is normal. No respiratory distress.     Breath sounds: Normal breath sounds. No decreased breath sounds, wheezing or rales.  Chest:     Chest wall: No tenderness.  Abdominal:     General: There is no distension.     Palpations: Abdomen is soft.     Tenderness: There is no abdominal tenderness.  Musculoskeletal:     Cervical back: Normal range of motion.  Skin:    General: Skin is warm and dry.     Nails: There is no clubbing.  Neurological:     Mental Status: She is alert and oriented to person, place, and time.  Psychiatric:        Speech: Speech normal.        Behavior: Behavior normal.        Thought Content: Thought content normal.        Judgment: Judgment normal.     Wt Readings from Last 3 Encounters:  04/11/20 258 lb (117 kg)  03/28/20 257 lb (116.6 kg)  03/25/20 257 lb (116.6 kg)     ASSESSMENT & PLAN:   1. HFrEF secondary to NICM/status post ICD: She appears euvolemic and well compensated with NYHA class II symptoms.  Initially, our plans were to escalate GDMT with addition of Jardiance today.  However, she indicated to me she has not been taking carvedilol, Lasix, or losartan as directed.  In this setting we looked to simplify her regimen instead in an effort to reduce stressors in her life.  We will transition her from carvedilol to Toprol-XL 50 mg daily.  This is not a direct one-to-one transition and is actually a reduced dose beta-blocker as I do not want to drop her blood pressure too much.  We will also change directions for losartan to take this 12.5 mg once daily as well  as her furosemide to be 20 mg daily.  This is how she has been taking her medications since she was last seen and is euvolemic with this regimen.  She will otherwise continue spironolactone 12.5 mg daily.  When she is seen in follow-up we should look to add an SGLT2 inhibitor.   Given the difficulty in taking twice daily medications we may find it difficult to transition her from losartan to [facility].  Currently, relative hypotension precludes this transition.  Her weight is stable.  CHF education.  Post-cath instructions.  2. NSVT: No evidence of significant ventricular ectopy on recent outpatient cardiac monitoring.  Her device company has sent her a new monitoring base for remote interrogations.  We will transition her from carvedilol to Toprol-XL as outlined above.  3. HTN: Blood pressure is well controlled.  Continue current medical therapy as outlined above.  4. HLD: LDL 113 from 11/2019 with normal coronary arteries noted on LHC as outlined above.  Recommend healthy diet and active lifestyle for now.  5. OSA: CPAP.   Disposition: F/u with Dr. Fletcher Anon or an APP in 3 months, and EP as directed.   Medication Adjustments/Labs and Tests Ordered: Current medicines are reviewed at length with the patient today.  Concerns regarding medicines are outlined above. Medication changes, Labs and Tests ordered today are summarized above and listed in the Patient Instructions accessible in Encounters.   Signed, Christell Faith, PA-C 04/11/2020 3:33 PM     [address], [place] [address] [phone]

## 2020-04-11 ENCOUNTER — Encounter: Payer: Self-pay | Admitting: Physician Assistant

## 2020-04-11 ENCOUNTER — Ambulatory Visit: Payer: Medicare HMO | Admitting: Physician Assistant

## 2020-04-11 ENCOUNTER — Other Ambulatory Visit: Payer: Self-pay

## 2020-04-11 VITALS — BP 102/76 | HR 76 | Ht 67.0 in | Wt 258.0 lb

## 2020-04-11 DIAGNOSIS — I472 Ventricular tachycardia: Secondary | ICD-10-CM | POA: Diagnosis not present

## 2020-04-11 DIAGNOSIS — Z95810 Presence of automatic (implantable) cardiac defibrillator: Secondary | ICD-10-CM

## 2020-04-11 DIAGNOSIS — E782 Mixed hyperlipidemia: Secondary | ICD-10-CM

## 2020-04-11 DIAGNOSIS — I5022 Chronic systolic (congestive) heart failure: Secondary | ICD-10-CM

## 2020-04-11 DIAGNOSIS — I428 Other cardiomyopathies: Secondary | ICD-10-CM

## 2020-04-11 DIAGNOSIS — G4730 Sleep apnea, unspecified: Secondary | ICD-10-CM | POA: Diagnosis not present

## 2020-04-11 DIAGNOSIS — I10 Essential (primary) hypertension: Secondary | ICD-10-CM | POA: Diagnosis not present

## 2020-04-11 DIAGNOSIS — I4729 Other ventricular tachycardia: Secondary | ICD-10-CM

## 2020-04-11 MED ORDER — METOPROLOL SUCCINATE ER 50 MG PO TB24: 50.0000 mg | ORAL_TABLET | Freq: Every day | ORAL | 1 refills | Status: DC

## 2020-04-11 MED ORDER — FUROSEMIDE 20 MG PO TABS: 20.0000 mg | ORAL_TABLET | Freq: Every day | ORAL | 1 refills | Status: DC

## 2020-04-11 MED ORDER — LOSARTAN POTASSIUM 25 MG PO TABS: 12.5000 mg | ORAL_TABLET | Freq: Every day | ORAL | 2 refills | Status: DC

## 2020-04-11 NOTE — Patient Instructions (Signed)
Medication Instructions:  Your physician has recommended you make the following change in your medication:  1- STOP Carvedilol. 2- START Toprol XL (metoprolol) 50 mg by mouth once a day. 3- TAKE Losartan 12.5 mg (0.5 tablet) by mouth once a day. 4- TAKE Furosemide 20 mg by mouth once a day.  *If you need a refill on your cardiac medications before your next appointment, please call your pharmacy*  Follow-Up: At [facility], you and your health needs are our priority.  As part of our continuing mission to provide you with exceptional heart care, we have created designated Provider Care Teams.  These Care Teams include your primary Cardiologist (physician) and Advanced Practice Providers (APPs -  Physician Assistants and Nurse Practitioners) who all work together to provide you with the care you need, when you need it.  We recommend signing up for the patient portal called "MyChart".  Sign up information is provided on this After Visit Summary.  MyChart is used to connect with patients for Virtual Visits (Telemedicine).  Patients are able to view lab/test results, encounter notes, upcoming appointments, etc.  Non-urgent messages can be sent to your provider as well.   To learn more about what you can do with MyChart, go to [email or web address].    Your next appointment:   3 month(s)  The format for your next appointment:   In Person  Provider:    You may see [name], MD or one of the following Advanced Practice Providers on your designated Care Team:  Christell Faith, [place]

## 2020-04-13 DIAGNOSIS — Z79899 Other long term (current) drug therapy: Secondary | ICD-10-CM | POA: Diagnosis not present

## 2020-04-13 DIAGNOSIS — M533 Sacrococcygeal disorders, not elsewhere classified: Secondary | ICD-10-CM | POA: Diagnosis not present

## 2020-04-13 DIAGNOSIS — M62830 Muscle spasm of back: Secondary | ICD-10-CM | POA: Diagnosis not present

## 2020-04-13 DIAGNOSIS — R202 Paresthesia of skin: Secondary | ICD-10-CM | POA: Diagnosis not present

## 2020-04-13 DIAGNOSIS — M47817 Spondylosis without myelopathy or radiculopathy, lumbosacral region: Secondary | ICD-10-CM | POA: Diagnosis not present

## 2020-04-13 DIAGNOSIS — Z79891 Long term (current) use of opiate analgesic: Secondary | ICD-10-CM | POA: Diagnosis not present

## 2020-04-13 DIAGNOSIS — G894 Chronic pain syndrome: Secondary | ICD-10-CM | POA: Diagnosis not present

## 2020-04-20 DIAGNOSIS — M461 Sacroiliitis, not elsewhere classified: Secondary | ICD-10-CM | POA: Diagnosis not present

## 2020-04-22 DIAGNOSIS — S8391XA Sprain of unspecified site of right knee, initial encounter: Secondary | ICD-10-CM | POA: Diagnosis not present

## 2020-04-25 ENCOUNTER — Ambulatory Visit: Payer: Medicare HMO | Admitting: Physician Assistant

## 2020-05-05 ENCOUNTER — Ambulatory Visit: Payer: Medicare HMO | Admitting: Physician Assistant

## 2020-05-10 DIAGNOSIS — G4733 Obstructive sleep apnea (adult) (pediatric): Secondary | ICD-10-CM | POA: Diagnosis not present

## 2020-05-10 DIAGNOSIS — E890 Postprocedural hypothyroidism: Secondary | ICD-10-CM | POA: Diagnosis not present

## 2020-05-10 DIAGNOSIS — J301 Allergic rhinitis due to pollen: Secondary | ICD-10-CM | POA: Diagnosis not present

## 2020-05-24 ENCOUNTER — Ambulatory Visit (INDEPENDENT_AMBULATORY_CARE_PROVIDER_SITE_OTHER): Payer: Medicare HMO

## 2020-05-24 DIAGNOSIS — I420 Dilated cardiomyopathy: Secondary | ICD-10-CM

## 2020-05-24 LAB — CUP PACEART REMOTE DEVICE CHECK
Battery Remaining Longevity: 57 mo
Battery Voltage: 2.99 V
Brady Statistic RV Percent Paced: 0.01 %
Date Time Interrogation Session: 20220426001703
HighPow Impedance: 49 Ohm
HighPow Impedance: 58 Ohm
Implantable Lead Implant Date: 20150508
Implantable Lead Location: 753860
Implantable Lead Model: 185
Implantable Lead Serial Number: 194163
Implantable Pulse Generator Implant Date: 20150508
Lead Channel Impedance Value: 456 Ohm
Lead Channel Impedance Value: 513 Ohm
Lead Channel Pacing Threshold Amplitude: 0.75 V
Lead Channel Pacing Threshold Pulse Width: 0.4 ms
Lead Channel Sensing Intrinsic Amplitude: 9.25 mV
Lead Channel Sensing Intrinsic Amplitude: 9.25 mV
Lead Channel Setting Pacing Amplitude: 2 V
Lead Channel Setting Pacing Pulse Width: 0.4 ms
Lead Channel Setting Sensing Sensitivity: 0.3 mV

## 2020-06-03 DIAGNOSIS — J0181 Other acute recurrent sinusitis: Secondary | ICD-10-CM | POA: Diagnosis not present

## 2020-06-03 DIAGNOSIS — J301 Allergic rhinitis due to pollen: Secondary | ICD-10-CM | POA: Diagnosis not present

## 2020-06-15 NOTE — Progress Notes (Signed)
Remote ICD transmission.   

## 2020-06-26 ENCOUNTER — Other Ambulatory Visit: Payer: Self-pay | Admitting: Cardiovascular Disease

## 2020-06-29 ENCOUNTER — Ambulatory Visit
Admission: RE | Admit: 2020-06-29 | Discharge: 2020-06-29 | Disposition: A | Discharge status: Home / Self Care | Payer: Medicare HMO | Source: Ambulatory Visit | Attending: Family Medicine | Admitting: Family Medicine

## 2020-06-29 ENCOUNTER — Other Ambulatory Visit: Payer: Self-pay

## 2020-06-29 DIAGNOSIS — Z1231 Encounter for screening mammogram for malignant neoplasm of breast: Secondary | ICD-10-CM | POA: Insufficient documentation

## 2020-07-05 ENCOUNTER — Telehealth: Payer: Self-pay | Admitting: Physician Assistant

## 2020-07-05 DIAGNOSIS — I420 Dilated cardiomyopathy: Secondary | ICD-10-CM

## 2020-07-05 NOTE — Telephone Encounter (Signed)
She can come in for a BMET prior to her visit so we can escalate GDMT as able when we see her in follow-up.  Diagnosis of nonischemic cardiomyopathy.

## 2020-07-05 NOTE — Telephone Encounter (Signed)
Patient calling to see if labs needed before upcoming appt 6/20 Lisa Fuentes.   Patient advised not noted and order not placed but nursing will review and call if needed otherwise keep appt as scheduled with no additional testing.

## 2020-07-05 NOTE — Telephone Encounter (Signed)
Spoke with patient and reviewed recommendations to have BMET done over at the [facility] entrance and check in at registration. She was agreeable with this plan and will have it done before her upcoming appointment. Advised that we will review results at her upcoming appointment. She verbalized understanding of our conversation with no further questions at this time.

## 2020-07-12 NOTE — Progress Notes (Deleted)
Cardiology Office Note    Date:  07/12/2020   ID:  Lisa Fuentes, DOB November 28, 1960, MRN [id number]  PCP:  Lisa Pink, MD  Cardiologist:  [name], MD  Electrophysiologist:  Virl Axe, MD   Chief Complaint: Follow-up  History of Present Illness:   Lisa Fuentes is a 60 y.o. female with history of normal coronary arteries by LHC in 03/2020, HFrEF secondary to NICM status post ICD in 2008 with generator change out in 05/2013, Covid infection diagnosed on 02/02/2020, HTN, HLD, hyperthyroidism status post radioactive iodine treatment on thyroid replacement therapy, and OSA on CPAP who presents for follow-up of her cardiomyopathy.   She was diagnosed with CHF in early 2000.  LHC in 2003 showed no significant CAD.  Nuclear stress test in 01/2015 showed no evidence of ischemia EF of 49%.  Echo in 01/2017 showed an EF of 45 to 50%.  Escalation of GDMT has previously been limited with labile blood pressures.  Echo in 03/2019 showed a stable cardiomyopathy with an EF of 45 to 50%, mild LVH, grade 1 diastolic dysfunction, normal RV systolic function and ventricular cavity size, trivial mitral regurgitation, and an estimated right atrial pressure of 3 mmHg.  Coronary CTA in 04/2019 showed a calcium score of 0 with no evidence of CAD.  She was seen in the office in 11/2019 and was doing well without any symptoms concerning for chest pain with stable exertional dyspnea.  She was under increased stress at home with the health of her brother.   She was diagnosed with Covid infection in early 01/2020 and did not require hospital admission.  She was fully vaccinated/boosted.  Following this, she was seen on 03/01/2020 with intermittent dyspnea, chest discomfort, and palpitations with stable weight.  In this setting, she underwent Lexiscan MPI on 03/21/2020 which showed a large defect of moderate severity present in the mid anterior, apical anterior, and apex location consistent with LAD distribution ischemia  with an EF of 30-44%, and was overall an intermediate risk scan.  Echo on 03/21/2020, showed an unchanged cardiomyopathy with an EF of 40-45%, unable to assess WMA, Gr1DD, normal RVSF and ventricular cavity size, no significant valvular abnormalities and an estimated right atrial pressure of 3 mmHg.  Given her abnormal Myoview, she underwent diagnostic LHC on 03/28/2020 which showed normal coronary arteries and a mildly elevated LVEDP at 20 mmHg.  Continued medical therapy for nonischemic cardiomyopathy was recommended.  Her stress test was a false positive.  Subsequent outpatient cardiac monitoring returned which showed a predominant rhythm of sinus with 2 episodes of SVT noted with the longest interval lasting just 4 beats.  Rare PACs and PVCs.  No NSVT or VT.  Most patient triggered events did not correlate with arrhythmia other than sinus tachycardia.  She was last seen in 03/2020 and was doing well from a cardiac perspective.  She noted resolution of her chest discomfort and underlying dyspnea.  She did continue to note intermittent palpitations.  She was taking carvedilol 12.5 mg once daily rather than twice daily, Lasix 20 mg daily rather than 40 mg daily and losartan 12.5 mg daily rather than twice daily.  She was taking spironolactone 12.5 mg daily as directed.  Her weight was stable.  Initially, our plans were to escalate GDMT at that visit, however given self titration of medications this needed to be addressed first.  In an effort to simplify her regimen, carvedilol was transition to Toprol-XL 50 mg, losartan was changed to 12.5  mg daily, and Lasix was changed to 20 mg daily.  This was how she had been taking her medications and was euvolemic with this regimen.  She was continued on spironolactone 12.5 mg.  ***     Labs independently reviewed: 06/2020 - *** 03/2020 - TSH normal, free T4 1.18 03/2020 - Hgb 13.5, PLT 157, potassium 4.1, BUN 18, serum creatinine 0.86 11/2019 - albumin 4.4, AST/ALT  normal, TC 171, TG 49, HDL 48, LDL 113 01/2019 - magnesium 2.1    Past Medical History:  Diagnosis Date   Anemia    Anxiety    panic attacks   Arthritis    Clotting disorder ([place])    Depression    Difficult intubation    Endometriosis of vagina 09/2013   Intra-operative findings of endometriosis implants on cervical stump   GERD (gastroesophageal reflux disease)    Headache    Hypercholesteremia    Hypertension    Hypokalemia    Hypothyroidism    Implantable defibrillator    Medtronic DOI 2008, replacement 2015   Menopausal symptoms    Nonischemic cardiomyopathy (HCC)    Interval improvement EF 40-45% 1/16   Shortness of breath dyspnea    chronic doe   Sleep apnea    Treated with CPAP   Tachycardia     Past Surgical History:  Procedure Laterality Date   ANTERIOR AND POSTERIOR REPAIR N/A 11/25/2017   Procedure: ANTERIOR (CYSTOCELE) AND POSTERIOR REPAIR (RECTOCELE);  Surgeon: Rubie Maid, MD;  Location: [facility];  Service: Gynecology;  Laterality: N/A;   BREAST CYST ASPIRATION Left 03/22/14   neg/ done by Dr Jamal Collin   CARDIAC CATHETERIZATION  2003   [place]: No significant coronary artery disease with reduced ejection fraction.   CARDIAC DEFIBRILLATOR PLACEMENT     CARPAL TUNNEL RELEASE Right    COLONOSCOPY  2015   COLONOSCOPY WITH PROPOFOL N/A 10/04/2015   Procedure: COLONOSCOPY WITH PROPOFOL;  Surgeon: Lucilla Lame, MD;  Location: [facility];  Service: Endoscopy;  Laterality: N/A;   CORONARY ANGIOPLASTY     CYSTOSCOPY  11/15/2014   Procedure: CYSTOSCOPY;  Surgeon: Rubie Maid, MD;  Location: [facility];  Service: Gynecology;;   FOOT SURGERY     HAND SURGERY     KNEE ARTHROSCOPY Left 03/02/2015   Procedure: ARTHROSCOPY  LEFT KNEE, PARTIAL LATERAL  MENISECTOMY, SYNOVECTOMY, MEDIAL & LATERAL CHONDROPLASTY;  Surgeon: Thornton Park, MD;  Location: [facility];  Service: Orthopedics;  Laterality: Left;  Dr. Mack Guise wanted her surgery switched from [facility]  because of the Defibrillator.  patient has a Defibrillator  takes Asprin/knows to stop 5days before  has had some test done   LAPAROSCOPIC SALPINGO OOPHERECTOMY Left 11/15/2014   Procedure: LAPAROSCOPIC OOPHORECTOMY;  Surgeon: Rubie Maid, MD;  Location: [facility];  Service: Gynecology;  Laterality: Left;   LAPAROSCOPIC SALPINGOOPHERECTOMY Right 09/2013   also with left salpingectomy   LEFT HEART CATH AND CORONARY ANGIOGRAPHY N/A 03/28/2020   Procedure: LEFT HEART CATH AND CORONARY ANGIOGRAPHY;  Surgeon: Wellington Hampshire, MD;  Location: [place] [facility];  Service: Cardiovascular;  Laterality: N/A;   TONSILLECTOMY     TOTAL ABDOMINAL HYSTERECTOMY     TRACHELECTOMY N/A 11/15/2014   Procedure: TRACHELECTOMY;  Surgeon: Rubie Maid, MD;  Location: [facility];  Service: Gynecology;  Laterality: N/A;   TUBAL LIGATION Bilateral     Current Medications: No outpatient medications have been marked as taking for the 07/18/20 encounter (Appointment) with Rise Mu, PA-C.    Allergies:  Sulfa antibiotics and Sulfamethoxazole-trimethoprim   Social History   Socioeconomic History   Marital status: Divorced    Spouse name: Not on file   Number of children: Not on file   Years of education: Not on file   Highest education level: Not on file  Occupational History   Not on file  Tobacco Use   Smoking status: Never   Smokeless tobacco: Never  Vaping Use   Vaping Use: Never used  Substance and Sexual Activity   Alcohol use: Yes    Alcohol/week: 0.0 standard drinks    Comment: occassionally   Drug use: No   Sexual activity: Not on file  Other Topics Concern   Not on file  Social History Narrative   Not on file   Social Determinants of Health   Financial Resource Strain: Not on file  Food Insecurity: Not on file  Transportation Needs: Not on file  Physical Activity: Not on file  Stress: Not on file  Social Connections: Not on file     Family History:  The patient's family  history includes Breast cancer (age of onset: 89) in her sister; Colon cancer in her mother; Hyperlipidemia in her mother; Hypertension in her mother; Ovarian cancer in her sister; Stroke in her mother; Throat cancer in her brother.  ROS:   ROS   EKGs/Labs/Other Studies Reviewed:    Studies reviewed were summarized above. The additional studies were reviewed today:  2D Echo 01/2017: - Left ventricle: The cavity size was mildly dilated. There was   mild concentric hypertrophy. Systolic function was mildly   reduced. The estimated ejection fraction was in the range of 45%   to 50%. Doppler parameters are consistent with abnormal left   ventricular relaxation (grade 1 diastolic dysfunction). - Left atrium: The atrium was mildly dilated. - Pulmonary arteries: Systolic pressure could not be accurately   estimated. __________   2D Echo 03/24/2019: 1. Left ventricular ejection fraction, by estimation, is 45 to 50%. The  left ventricle has mildly decreased function. The left ventricle  demonstrates global hypokinesis. The left ventricular internal cavity size  was mildly dilated. There is mild left  ventricular hypertrophy. Left ventricular diastolic parameters are  consistent with Grade I diastolic dysfunction (impaired relaxation).   2. Right ventricular systolic function is normal. The right ventricular  size is normal. Tricuspid regurgitation signal is inadequate for assessing  PA pressure.   3. The mitral valve is normal in structure and function. Trivial mitral  valve regurgitation.   4. The aortic valve is tricuspid. Aortic valve regurgitation is not  visualized. No aortic stenosis is present.   5. Aortic dilatation noted. There is borderline dilatation of the aortic  root measuring 37 mm.   6. Mildly dilated pulmonary artery.   7. The inferior vena cava is normal in size with greater than 50%  respiratory variability, suggesting right atrial pressure of 3 mmHg. __________    Coronary CTA 04/2019: IMPRESSION: 1. No evidence of CAD, CADRADS = 0. 2. Coronary calcium score of 0. 3. Normal coronary origin with right dominance. __________   Carlton Adam MPI 03/21/2020: There was no ST segment deviation noted during stress. No T wave inversion was noted during stress. Defect 1: There is a large defect of moderate severity present in the mid anterior, apical anterior and apex location. Findings consistent with ischemia in the LAD distribution. This is an intermediate risk study. The left ventricular ejection fraction is moderately decreased (30-44%).   __________   2D  echo 03/21/2020: 1. Left ventricular ejection fraction, by estimation, is 40 to 45%. The  left ventricle has mildly decreased function. Left ventricular endocardial  border not optimally defined to evaluate regional wall motion. Left  ventricular diastolic parameters are  consistent with Grade I diastolic dysfunction (impaired relaxation).   2. Right ventricular systolic function is normal. The right ventricular  size is normal.   3. The mitral valve is normal in structure. No evidence of mitral valve  regurgitation. No evidence of mitral stenosis.   4. The aortic valve is normal in structure. Aortic valve regurgitation is  not visualized. No aortic stenosis is present.   5. The inferior vena cava is normal in size with greater than 50%  respiratory variability, suggesting right atrial pressure of 3 mmHg. __________   LHC 03/2020: 1.  Normal coronary arteries. 2.  Left ventricular angiography was not performed.  EF was mildly reduced by echo. 3.  Mildly elevated left ventricular end-diastolic pressure 20 mmHg.   Recommendations: False positive nuclear stress test. Recommend continuing medical therapy for nonischemic cardiomyopathy. __________   Elwyn Reach patch 03/2020: Patient had a min HR of 53 bpm, max HR of 184 bpm, and avg HR of 85 bpm. Predominant underlying rhythm was Sinus Rhythm. Possible  Atrial and Ventricular Pacing was present. 2 Supraventricular Tachycardia runs occurred, the run with the fastest interval lasting 4 beats with a max rate of 184 bpm, the longest lasting 4 beats with an avg rate of 113 bpm. Rare PACs and rare PVCs. Most triggered events did not correlate with arrhythmia other than sinus tachycardia.   EKG:  EKG is ordered today.  The EKG ordered today demonstrates ***  Recent Labs: 03/25/2020: BUN 18; Creatinine, Ser 0.86; Hemoglobin 13.5; Platelets 157; Potassium 4.1; Sodium 141  Recent Lipid Panel No results found for: CHOL, TRIG, HDL, CHOLHDL, VLDL, LDLCALC, LDLDIRECT  PHYSICAL EXAM:    VS:  There were no vitals taken for this visit.  BMI: There is no height or weight on file to calculate BMI.  Physical Exam  Wt Readings from Last 3 Encounters:  04/11/20 258 lb (117 kg)  03/28/20 257 lb (116.6 kg)  03/25/20 257 lb (116.6 kg)     ASSESSMENT & PLAN:   HFrEF secondary to NICM/status post ICD:  NSVT:  HTN: Blood pressure ***  HLD: LDL 113 from 11/2019 with normal coronary arteries noted on LHC as outlined above.  OSA:  Disposition: F/u with Dr. Fletcher Anon or an APP in ***, and EP as directed.   Medication Adjustments/Labs and Tests Ordered: Current medicines are reviewed at length with the patient today.  Concerns regarding medicines are outlined above. Medication changes, Labs and Tests ordered today are summarized above and listed in the Patient Instructions accessible in Encounters.   Signed, Christell Faith, PA-C 07/12/2020 3:44 PM     [address] [place], [place] [address] [phone]

## 2020-07-15 ENCOUNTER — Other Ambulatory Visit
Admission: RE | Admit: 2020-07-15 | Discharge: 2020-07-15 | Disposition: A | Discharge status: Home / Self Care | Payer: Medicare HMO | Attending: Physician Assistant | Admitting: Physician Assistant

## 2020-07-15 DIAGNOSIS — R202 Paresthesia of skin: Secondary | ICD-10-CM | POA: Diagnosis not present

## 2020-07-15 DIAGNOSIS — M545 Low back pain, unspecified: Secondary | ICD-10-CM | POA: Diagnosis not present

## 2020-07-15 DIAGNOSIS — M62830 Muscle spasm of back: Secondary | ICD-10-CM | POA: Diagnosis not present

## 2020-07-15 DIAGNOSIS — G894 Chronic pain syndrome: Secondary | ICD-10-CM | POA: Diagnosis not present

## 2020-07-15 DIAGNOSIS — M47817 Spondylosis without myelopathy or radiculopathy, lumbosacral region: Secondary | ICD-10-CM | POA: Diagnosis not present

## 2020-07-15 DIAGNOSIS — I420 Dilated cardiomyopathy: Secondary | ICD-10-CM | POA: Diagnosis not present

## 2020-07-15 DIAGNOSIS — M533 Sacrococcygeal disorders, not elsewhere classified: Secondary | ICD-10-CM | POA: Diagnosis not present

## 2020-07-15 DIAGNOSIS — Z79899 Other long term (current) drug therapy: Secondary | ICD-10-CM | POA: Diagnosis not present

## 2020-07-15 LAB — BASIC METABOLIC PANEL
Anion gap: 7 (ref 5–15)
BUN: 12 mg/dL (ref 6–20)
CO2: 27 mmol/L (ref 22–32)
Calcium: 9.4 mg/dL (ref 8.9–10.3)
Chloride: 105 mmol/L (ref 98–111)
Creatinine, Ser: 0.78 mg/dL (ref 0.44–1.00)
GFR, Estimated: >60 mL/min (ref >60)
Glucose, Bld: 106 mg/dL — ABNORMAL HIGH (ref 70–99)
Potassium: 3.9 mmol/L (ref 3.5–5.1)
Sodium: 139 mmol/L (ref 135–145)

## 2020-07-18 ENCOUNTER — Ambulatory Visit: Payer: Medicare HMO | Admitting: Physician Assistant

## 2020-07-20 DIAGNOSIS — Z0100 Encounter for examination of eyes and vision without abnormal findings: Secondary | ICD-10-CM | POA: Diagnosis not present

## 2020-07-22 DIAGNOSIS — M2550 Pain in unspecified joint: Secondary | ICD-10-CM | POA: Diagnosis not present

## 2020-07-22 DIAGNOSIS — H04123 Dry eye syndrome of bilateral lacrimal glands: Secondary | ICD-10-CM | POA: Diagnosis not present

## 2020-07-22 DIAGNOSIS — M159 Polyosteoarthritis, unspecified: Secondary | ICD-10-CM | POA: Diagnosis not present

## 2020-07-22 DIAGNOSIS — M1811 Unilateral primary osteoarthritis of first carpometacarpal joint, right hand: Secondary | ICD-10-CM | POA: Diagnosis not present

## 2020-07-22 DIAGNOSIS — M7989 Other specified soft tissue disorders: Secondary | ICD-10-CM | POA: Diagnosis not present

## 2020-07-22 DIAGNOSIS — M5136 Other intervertebral disc degeneration, lumbar region: Secondary | ICD-10-CM | POA: Diagnosis not present

## 2020-07-26 ENCOUNTER — Other Ambulatory Visit: Payer: Self-pay

## 2020-07-26 ENCOUNTER — Ambulatory Visit: Payer: Medicare HMO | Admitting: Cardiovascular Disease

## 2020-07-26 ENCOUNTER — Encounter: Payer: Self-pay | Admitting: Cardiovascular Disease

## 2020-07-26 VITALS — BP 100/64 | HR 84 | Ht 67.0 in | Wt 251.5 lb

## 2020-07-26 DIAGNOSIS — I5022 Chronic systolic (congestive) heart failure: Secondary | ICD-10-CM

## 2020-07-26 DIAGNOSIS — I4729 Other ventricular tachycardia: Secondary | ICD-10-CM

## 2020-07-26 DIAGNOSIS — I472 Ventricular tachycardia: Secondary | ICD-10-CM | POA: Diagnosis not present

## 2020-07-26 DIAGNOSIS — Z9989 Dependence on other enabling machines and devices: Secondary | ICD-10-CM

## 2020-07-26 DIAGNOSIS — G4733 Obstructive sleep apnea (adult) (pediatric): Secondary | ICD-10-CM | POA: Diagnosis not present

## 2020-07-26 DIAGNOSIS — Z95810 Presence of automatic (implantable) cardiac defibrillator: Secondary | ICD-10-CM

## 2020-07-26 DIAGNOSIS — E039 Hypothyroidism, unspecified: Secondary | ICD-10-CM | POA: Diagnosis not present

## 2020-07-26 MED ORDER — METOPROLOL SUCCINATE ER 50 MG PO TB24: 50.0000 mg | ORAL_TABLET | Freq: Every day | ORAL | 1 refills | Status: DC

## 2020-07-26 MED ORDER — EMPAGLIFLOZIN 10 MG PO TABS: 10.0000 mg | ORAL_TABLET | Freq: Every day | ORAL | 5 refills | Status: DC

## 2020-07-26 NOTE — Patient Instructions (Signed)
Medication Instructions:  Your physician has recommended you make the following change in your medication:   START Jardiance 10 mg daily. An Rx has been sent to your pharmacy.  Metoprolol has been refilled today.    *If you need a refill on your cardiac medications before your next appointment, please call your pharmacy*   Lab Work: None ordered  If you have labs (blood work) drawn today and your tests are completely normal, you will receive your results only by: [place] (if you have MyChart) OR A paper copy in the mail If you have any lab test that is abnormal or we need to change your treatment, we will call you to review the results.   Testing/Procedures: None ordered   Follow-Up: At [facility], you and your health needs are our priority.  As part of our continuing mission to provide you with exceptional heart care, we have created designated Provider Care Teams.  These Care Teams include your primary Cardiologist (physician) and Advanced Practice Providers (APPs -  Physician Assistants and Nurse Practitioners) who all work together to provide you with the care you need, when you need it.  We recommend signing up for the patient portal called "MyChart".  Sign up information is provided on this After Visit Summary.  MyChart is used to connect with patients for Virtual Visits (Telemedicine).  Patients are able to view lab/test results, encounter notes, upcoming appointments, etc.  Non-urgent messages can be sent to your provider as well.   To learn more about what you can do with MyChart, go to [email or web address].    Your next appointment:   3 month(s)  The format for your next appointment:   In Person  Provider:   You may see [name], MD or one of the following Advanced Practice Providers on your designated Care Team:   Murray Hodgkins, NP Christell Faith, PA-C Marrianne Mood, PA-C Cadence Orange, [place] Laurann Montana, NP   Other  Instructions N/A

## 2020-07-26 NOTE — Progress Notes (Signed)
Cardiology Office Note   Date:  07/26/2020   ID:  Lisa Fuentes, DOB 1960/03/14, MRN [id number]  PCP:  [place] Pink, MD  Cardiologist:   [name], MD   Chief Complaint  Patient presents with   Other    3 month f/u no complaints today. Meds reviewed verbally with pt.      History of Present Illness: Lisa Fuentes is a 60 y.o. female who presents for a follow-up visit regarding chronic systolic heart failure due to nonischemic cardiomyopathy. She has known history of chronic systolic heart failure diagnosed in early 2000. She had cardiac catheterization done in 2003 which showed no significant coronary artery disease. She had an ICD placement in 2008 with generator replacement in May 2015. She has known history of sleep apnea, hypertension and hyperthyroidism. She received radioactive iodine treatment and currently is on thyroid replacement therapy. Most recent nuclear stress test in January 2017  showed no evidence of ischemia with ejection fraction of 49%. Most recent echocardiogram in February of this year showed an EF of 45 to 50%. She underwent cardiac CTA in April of 2021 which showed calcium score of 0 with no evidence of coronary artery disease.  She had recurrent chest pain earlier this year.  She underwent a Lexiscan Myoview in February which was suggestive of ischemia in the LAD distribution.  Thus, cardiac catheterization was performed which showed normal coronary arteries with mildly elevated left ventricular end-diastolic pressure.  During last visit, she was switched from carvedilol to Toprol given frequently missing evening dose.  She has been doing reasonably well with stable exertional dyspnea.  Past Medical History:  Diagnosis Date   Anemia    Anxiety    panic attacks   Arthritis    Clotting disorder ([place])    Depression    Difficult intubation    Endometriosis of vagina 09/2013   Intra-operative findings of endometriosis implants on cervical stump    GERD (gastroesophageal reflux disease)    Headache    Hypercholesteremia    Hypertension    Hypokalemia    Hypothyroidism    Implantable defibrillator    Medtronic DOI 2008, replacement 2015   Menopausal symptoms    Nonischemic cardiomyopathy (HCC)    Interval improvement EF 40-45% 1/16   Shortness of breath dyspnea    chronic doe   Sleep apnea    Treated with CPAP   Tachycardia     Past Surgical History:  Procedure Laterality Date   ANTERIOR AND POSTERIOR REPAIR N/A 11/25/2017   Procedure: ANTERIOR (CYSTOCELE) AND POSTERIOR REPAIR (RECTOCELE);  Surgeon: Rubie Maid, MD;  Location: [facility];  Service: Gynecology;  Laterality: N/A;   BREAST CYST ASPIRATION Left 03/22/14   neg/ done by Dr Jamal Collin   CARDIAC CATHETERIZATION  2003   [place]: No significant coronary artery disease with reduced ejection fraction.   CARDIAC DEFIBRILLATOR PLACEMENT     CARPAL TUNNEL RELEASE Right    COLONOSCOPY  2015   COLONOSCOPY WITH PROPOFOL N/A 10/04/2015   Procedure: COLONOSCOPY WITH PROPOFOL;  Surgeon: Lucilla Lame, MD;  Location: [facility];  Service: Endoscopy;  Laterality: N/A;   CORONARY ANGIOPLASTY     CYSTOSCOPY  11/15/2014   Procedure: CYSTOSCOPY;  Surgeon: Rubie Maid, MD;  Location: [facility];  Service: Gynecology;;   FOOT SURGERY     HAND SURGERY     KNEE ARTHROSCOPY Left 03/02/2015   Procedure: ARTHROSCOPY  LEFT KNEE, PARTIAL LATERAL  MENISECTOMY, SYNOVECTOMY, MEDIAL & LATERAL CHONDROPLASTY;  Surgeon:  Thornton Park, MD;  Location: [facility];  Service: Orthopedics;  Laterality: Left;  Dr. Mack Guise wanted her surgery switched from [facility] because of the Defibrillator.  patient has a Defibrillator  takes Asprin/knows to stop 5days before  has had some test done   LAPAROSCOPIC SALPINGO OOPHERECTOMY Left 11/15/2014   Procedure: LAPAROSCOPIC OOPHORECTOMY;  Surgeon: Rubie Maid, MD;  Location: [facility];  Service: Gynecology;  Laterality: Left;   LAPAROSCOPIC  SALPINGOOPHERECTOMY Right 09/2013   also with left salpingectomy   LEFT HEART CATH AND CORONARY ANGIOGRAPHY N/A 03/28/2020   Procedure: LEFT HEART CATH AND CORONARY ANGIOGRAPHY;  Surgeon: Wellington Hampshire, MD;  Location: [place] [facility];  Service: Cardiovascular;  Laterality: N/A;   TONSILLECTOMY     TOTAL ABDOMINAL HYSTERECTOMY     TRACHELECTOMY N/A 11/15/2014   Procedure: TRACHELECTOMY;  Surgeon: Rubie Maid, MD;  Location: [facility];  Service: Gynecology;  Laterality: N/A;   TUBAL LIGATION Bilateral      Current Outpatient Medications  Medication Sig Dispense Refill   aspirin EC 81 MG tablet Take 1 tablet (81 mg total) by mouth daily.     cetirizine (ZYRTEC) 10 MG tablet Take 10 mg by mouth daily.     cyclobenzaprine (FLEXERIL) 10 MG tablet Take 10 mg by mouth 3 (three) times daily as needed for muscle spasms.     esomeprazole (NEXIUM) 40 MG capsule Take 40 mg by mouth every morning.      EUTHYROX 137 MCG tablet Take 137 mcg by mouth every morning.     fluticasone (FLONASE) 50 MCG/ACT nasal spray Place 1 spray into both nostrils daily.      furosemide (LASIX) 20 MG tablet Take 1 tablet (20 mg total) by mouth daily. *PATIENT OCCASIONALLY TAKES AN EXTRA TABLET* 90 tablet 1   losartan (COZAAR) 25 MG tablet Take 0.5 tablets (12.5 mg total) by mouth daily. 45 tablet 2   metoprolol succinate (TOPROL XL) 50 MG 24 hr tablet Take 1 tablet (50 mg total) by mouth daily. Take with or immediately following a meal. 90 tablet 1   nystatin (MYCOSTATIN) powder Apply 1 Bottle topically 4 (four) times daily as needed (irritaton). Reported on 08/18/2015     nystatin cream (MYCOSTATIN) Apply topically 2 (two) times daily.     oxyCODONE-acetaminophen (PERCOCET/ROXICET) 5-325 MG tablet Take by mouth every 4 (four) hours as needed for severe pain.     sertraline (ZOLOFT) 100 MG tablet Take 100 mg by mouth daily.     spironolactone (ALDACTONE) 25 MG tablet Take 1/2 (one-half) tablet by mouth once daily 45  tablet 0   potassium chloride (KLOR-CON) 10 MEQ tablet Take 1 tablet (10 mEq total) by mouth daily. (Patient not taking: Reported on 07/26/2020) 90 tablet 3   No current facility-administered medications for this visit.    Allergies:   Sulfa antibiotics and Sulfamethoxazole-trimethoprim    Social History:  The patient  reports that she has never smoked. She has never used smokeless tobacco. She reports current alcohol use. She reports that she does not use drugs.   Family History:  The patient's family history includes Breast cancer (age of onset: 61) in her sister; Colon cancer in her mother; Hyperlipidemia in her mother; Hypertension in her mother; Ovarian cancer in her sister; Stroke in her mother; Throat cancer in her brother.    ROS:  Please see the history of present illness.   Otherwise, review of systems are positive for none.   All other systems are reviewed  and negative.    PHYSICAL EXAM: VS:  BP 100/64 (BP Location: Left Arm, Patient Position: Sitting, Cuff Size: Large)   Pulse 84   Ht 5\' 7"  (1.702 m)   Wt 251 lb 8 oz (114.1 kg)   SpO2 98%   BMI 39.39 kg/m  , BMI Body mass index is 39.39 kg/m. GEN: Well nourished, well developed, in no acute distress  HEENT: normal  Neck: no JVD, carotid bruits, or masses Cardiac: RRR; no murmurs, rubs, or gallops, trace edema  Respiratory:  clear to auscultation bilaterally, normal work of breathing GI: soft, nontender, nondistended, + BS MS: no deformity or atrophy  Skin: warm and dry, no rash Neuro:  Strength and sensation are intact Psych: euthymic mood, full affect   EKG:  EKG is ordered today. EKG showed normal sinus rhythm with minimal LVH and nonspecific ST changes.    Recent Labs: 03/25/2020: Hemoglobin 13.5; Platelets 157 07/15/2020: BUN 12; Creatinine, Ser 0.78; Potassium 3.9; Sodium 139    Lipid Panel No results found for: CHOL, TRIG, HDL, CHOLHDL, VLDL, LDLCALC, LDLDIRECT    Wt Readings from Last 3 Encounters:   07/26/20 251 lb 8 oz (114.1 kg)  04/11/20 258 lb (117 kg)  03/28/20 257 lb (116.6 kg)       ASSESSMENT AND PLAN:  1.   Chronic systolic heart failure: Most recent ejection fraction was 45 to 50%.  She is doing well at the present time and continues to be in [place] class II. Continue treatment with Toprol, losartan and spironolactone.  I elected to add Jardiance 10 mg daily today.  I discussed side effects.  I refilled Toprol today.  2. Status post ICD placement: Followed by Dr. Caryl Comes  3.  NSVT: No recurrent palpitation.  Seems to be well controlled with beta-blocker therapy.  I refilled Toprol.  4.  Obstructive sleep apnea on CPAP.  Disposition:   FU with me in 6 months  Signed,  [name], MD  07/26/2020 3:49 PM    [place]

## 2020-07-27 ENCOUNTER — Telehealth: Payer: Self-pay | Admitting: Cardiovascular Disease

## 2020-07-27 NOTE — Telephone Encounter (Signed)
Spoke with patient and she reports cost is too expensive and wants to know of an alternative. Will forward this message over to provider and his nurse for follow up on this.

## 2020-07-27 NOTE — Telephone Encounter (Signed)
Patient requests an alternative medicine to [place], and/or a coupon for discount for this medication

## 2020-07-28 NOTE — Telephone Encounter (Signed)
Spoke with patient and reviewed providers information. Advised that we could fill out patient assistance application for her but she states she will push through and only get the 30 day supply which costs her $45. Advised if she should change her mind to let us know and we can assist with this. She verbalized understanding with no further questions at this time.   Application completed and only needs some questions answered and signature then provider portion can be completed at her next visit.

## 2020-07-28 NOTE — Telephone Encounter (Signed)
There are no alternatives.  Can you apply for patient's assisted program?

## 2020-07-29 DIAGNOSIS — M461 Sacroiliitis, not elsewhere classified: Secondary | ICD-10-CM | POA: Diagnosis not present

## 2020-08-03 ENCOUNTER — Telehealth: Payer: Self-pay | Admitting: Physician Assistant

## 2020-08-03 NOTE — Telephone Encounter (Signed)
Pt c/o medication issue:  1. Name of Medication: Jardiance  2. How are you currently taking this medication (dosage and times per day)?   3. Are you having a reaction (difficulty breathing--STAT)? Yes, patient has a UTI   4. What is your medication issue? Reaction.   Patient would like to know if she should stop taking this medication.

## 2020-08-03 NOTE — Telephone Encounter (Signed)
Spoke with patient and she thinks she has a urinary tract infection with some discomfort and frequent urination. Inquired if she has been checked for this and she has not. Recommended that she check with her primary care provider or go to Urgent care to have them test for any possible UTI. Reviewed the benefits of this medication and to continue until she has been checked. Advised that if she does in fact have UTI then to call back but if it is not that then check with her primary care provider regarding her symptoms. She verbalized understanding, agreement with plan, and had no further questions at this time.

## 2020-08-23 ENCOUNTER — Ambulatory Visit (INDEPENDENT_AMBULATORY_CARE_PROVIDER_SITE_OTHER): Payer: Medicare HMO

## 2020-08-23 DIAGNOSIS — I420 Dilated cardiomyopathy: Secondary | ICD-10-CM | POA: Diagnosis not present

## 2020-08-23 LAB — CUP PACEART REMOTE DEVICE CHECK
Battery Remaining Longevity: 51 mo
Battery Voltage: 2.98 V
Brady Statistic RV Percent Paced: 0.01 %
Date Time Interrogation Session: 20220726012404
HighPow Impedance: 50 Ohm
HighPow Impedance: 60 Ohm
Implantable Lead Implant Date: 20150508
Implantable Lead Location: 753860
Implantable Lead Model: 185
Implantable Lead Serial Number: 194163
Implantable Pulse Generator Implant Date: 20150508
Lead Channel Impedance Value: 475 Ohm
Lead Channel Impedance Value: 513 Ohm
Lead Channel Pacing Threshold Amplitude: 0.875 V
Lead Channel Pacing Threshold Pulse Width: 0.4 ms
Lead Channel Sensing Intrinsic Amplitude: 9.375 mV
Lead Channel Sensing Intrinsic Amplitude: 9.375 mV
Lead Channel Setting Pacing Amplitude: 2 V
Lead Channel Setting Pacing Pulse Width: 0.4 ms
Lead Channel Setting Sensing Sensitivity: 0.3 mV

## 2020-09-15 DIAGNOSIS — M5136 Other intervertebral disc degeneration, lumbar region: Secondary | ICD-10-CM | POA: Diagnosis not present

## 2020-09-15 DIAGNOSIS — M159 Polyosteoarthritis, unspecified: Secondary | ICD-10-CM | POA: Diagnosis not present

## 2020-09-16 NOTE — Progress Notes (Signed)
Remote ICD transmission.   

## 2020-09-22 ENCOUNTER — Other Ambulatory Visit: Payer: Self-pay

## 2020-09-22 ENCOUNTER — Ambulatory Visit: Payer: Medicare HMO | Admitting: Obstetrics and Gynecology

## 2020-09-22 ENCOUNTER — Encounter: Payer: Self-pay | Admitting: Obstetrics and Gynecology

## 2020-09-22 VITALS — BP 112/77 | HR 86 | Ht 67.0 in | Wt 245.5 lb

## 2020-09-22 DIAGNOSIS — R634 Abnormal weight loss: Secondary | ICD-10-CM | POA: Diagnosis not present

## 2020-09-22 DIAGNOSIS — R194 Change in bowel habit: Secondary | ICD-10-CM | POA: Diagnosis not present

## 2020-09-22 DIAGNOSIS — Z809 Family history of malignant neoplasm, unspecified: Secondary | ICD-10-CM

## 2020-09-22 DIAGNOSIS — R198 Other specified symptoms and signs involving the digestive system and abdomen: Secondary | ICD-10-CM | POA: Diagnosis not present

## 2020-09-22 DIAGNOSIS — K5730 Diverticulosis of large intestine without perforation or abscess without bleeding: Secondary | ICD-10-CM | POA: Diagnosis not present

## 2020-09-22 DIAGNOSIS — Z800 Family history of malignant neoplasm of digestive organs: Secondary | ICD-10-CM | POA: Diagnosis not present

## 2020-09-22 DIAGNOSIS — R102 Pelvic and perineal pain: Secondary | ICD-10-CM | POA: Diagnosis not present

## 2020-09-22 DIAGNOSIS — N993 Prolapse of vaginal vault after hysterectomy: Secondary | ICD-10-CM | POA: Diagnosis not present

## 2020-09-22 NOTE — Progress Notes (Signed)
GYNECOLOGY PROGRESS NOTE  Subjective:    Patient ID: Lisa Fuentes, female    DOB: August 11, 1960, 60 y.o.   MRN: [id number]  HPI  Patient is a 60 y.o. [id number] female who presents for pelvic pain. Patient stated that she noticed the pelvic pain and back pain about 3 months ago. Notes that hit hurts to walk. Has been concerned as she has a sister who passed away from ovarian cancer 6 months ago.  Also with a brother who had throat cancer, passed away 8 months ago.  She notes that she has been experiencing weight loss.  Knows that she has cut out sodas and this could be a cause, but just is very concerned.  Patient has remote history of hysterectomy with BSO.  Also with history of bladder tack 2-3 years ago. States that she desires to get everything checked out. Plans for her colonoscopy in September. Patient last seen by GYN in 2019.   The following portions of the patient's history were reviewed and updated as appropriate:  She  has a past medical history of Anemia, Anxiety, Arthritis, Clotting disorder ([place]), Depression, Difficult intubation, Endometriosis of vagina (09/2013), GERD (gastroesophageal reflux disease), Headache, Hypercholesteremia, Hypertension, Hypokalemia, Hypothyroidism, Implantable defibrillator, Menopausal symptoms, Nonischemic cardiomyopathy ([place]), Shortness of breath dyspnea, Sleep apnea, and Tachycardia.  She  has a past surgical history that includes Cardiac defibrillator placement; Total abdominal hysterectomy; Cardiac catheterization (2003); Tonsillectomy; Foot surgery; Colonoscopy (2015); Tubal ligation (Bilateral); Laparoscopic salpingoopherectomy (Right, 09/2013); Coronary angioplasty; Hand surgery; Laparoscopic salpingo oophorectomy (Left, 11/15/2014); Trachelectomy (N/A, 11/15/2014); Cystoscopy (11/15/2014); Knee arthroscopy (Left, 03/02/2015); Colonoscopy with propofol (N/A, 10/04/2015); Anterior and posterior repair (N/A, 11/25/2017); Carpal tunnel release (Right); LEFT HEART  CATH AND CORONARY ANGIOGRAPHY (N/A, 03/28/2020); and Breast cyst aspiration (Left, 03/22/14).  Her family history includes Breast cancer (age of onset: 6) in her sister; Colon cancer in her mother; Hyperlipidemia in her mother; Hypertension in her mother; Ovarian cancer in her sister; Stroke in her mother; Throat cancer in her brother.  She  reports that she has never smoked. She has never used smokeless tobacco. She reports current alcohol use. She reports that she does not use drugs.   Current Outpatient Medications on File Prior to Visit  Medication Sig Dispense Refill   aspirin EC 81 MG tablet Take 1 tablet (81 mg total) by mouth daily.     cetirizine (ZYRTEC) 10 MG tablet Take 10 mg by mouth daily.     cyclobenzaprine (FLEXERIL) 10 MG tablet Take 10 mg by mouth 3 (three) times daily as needed for muscle spasms.     empagliflozin (JARDIANCE) 10 MG TABS tablet Take 1 tablet (10 mg total) by mouth daily before breakfast. 30 tablet 5   esomeprazole (NEXIUM) 40 MG capsule Take 40 mg by mouth every morning.      EUTHYROX 137 MCG tablet Take 137 mcg by mouth every morning.     fluticasone (FLONASE) 50 MCG/ACT nasal spray Place 1 spray into both nostrils daily.      furosemide (LASIX) 20 MG tablet Take 1 tablet (20 mg total) by mouth daily. *PATIENT OCCASIONALLY TAKES AN EXTRA TABLET* 90 tablet 1   losartan (COZAAR) 25 MG tablet Take 0.5 tablets (12.5 mg total) by mouth daily. 45 tablet 2   metoprolol succinate (TOPROL XL) 50 MG 24 hr tablet Take 1 tablet (50 mg total) by mouth daily. Take with or immediately following a meal. 90 tablet 1   nystatin (MYCOSTATIN) powder Apply 1 Bottle topically 4 (  four) times daily as needed (irritaton). Reported on 08/18/2015     nystatin cream (MYCOSTATIN) Apply topically 2 (two) times daily.     oxyCODONE-acetaminophen (PERCOCET/ROXICET) 5-325 MG tablet Take by mouth every 4 (four) hours as needed for severe pain.     sertraline (ZOLOFT) 100 MG tablet Take 100 mg  by mouth daily.     spironolactone (ALDACTONE) 25 MG tablet Take 1/2 (one-half) tablet by mouth once daily 45 tablet 0   No current facility-administered medications on file prior to visit.   She is allergic to sulfa antibiotics and sulfamethoxazole-trimethoprim..  Review of Systems Pertinent items noted in HPI and remainder of comprehensive ROS otherwise negative.   Objective:   Blood pressure 112/77, pulse 86, height '5\' 7"'$  (1.702 m), weight 245 lb 8 oz (111.4 kg).  Body mass index is 38.45 kg/m. General appearance: alert, cooperative, appears stated age, and mild distress Abdomen: soft, non-tender; bowel sounds normal; no masses,  no organomegaly Pelvic: external genitalia normal, rectovaginal septum normal.  Vagina without discharge.  Grade 1-2 apical vaginal vault prolapse.   Adnexae surgically absent.  Extremities: extremities normal, atraumatic, no cyanosis or edema Neurologic: Grossly normal   Assessment:   1. Pelvic pain   2. Family history of cancer   3. Vaginal vault prolapse, posthysterectomy     Plan:   Pelvic pain - unclear cause.  No pelvic masses appreciated. No vaginal discharge noted. May possibly be secondary to mild vaginal vault prolapse. Discussed option of trial of pessary vs surgical intervention (vaginal vault suspension). Given handouts on both. Patient willing to try pessary first.  Will f/u in 3-4 weeks for pessary fitting.  Family history of cancer, discussed patient's personal risk, advised on hereditary cancer screening.  Given information. Can perform testing if desired.    Rubie Maid, MD [facility]

## 2020-09-22 NOTE — Patient Instructions (Signed)
Pelvic Organ Prolapse Pelvic organ prolapse is a condition in women that involves the stretching, bulging, or dropping of pelvic organs into an abnormal position, past the opening of the vagina. It happens when the muscles and tissues that surround and support pelvic structures become weak or stretched. Pelvic organ prolapse can involve the: Vagina (vaginal prolapse). Uterus (uterine prolapse). Bladder (cystocele). Rectum (rectocele). Intestines (enterocele). When organs other than the vagina are involved, they often bulge into thevagina or protrude from the vagina, depending on how severe the prolapse is. What are the causes? This condition may be caused by: Pregnancy, labor, and childbirth. Past pelvic surgery. Lower levels of the hormone estrogen due to menopause. Consistently lifting more than 50 lb (23 kg). Obesity. Long-term difficulty passing stool (chronic constipation). Long-term, or chronic, cough. Fluid buildup in the abdomen due to certain conditions. What are the signs or symptoms? Symptoms of this condition include: Leaking a little urine (loss of bladder control) when you cough, sneeze, strain, and exercise (stress incontinence). This may be worse immediately after childbirth. It may gradually improve over time. Feeling pressure in your pelvis or vagina. This pressure may increase when you cough or when you are passing stool. A bulge that protrudes from the opening of your vagina. Difficulty passing urine or stool. Pain in your lower back. Pain or discomfort during sex, or decreased interest in sex. Repeated bladder infections (urinary tract infections). Difficulty inserting a tampon. In some people, this condition causes no symptoms. How is this diagnosed? This condition may be diagnosed based on a vaginal and rectal exam. During the exam, you may be asked to cough and strain while you are lying down, sitting, and standing up. Your health care provider will determine if  other tests arerequired, such as bladder function tests. How is this treated? Treatment for this condition may depend on your symptoms. Treatment may include: Lifestyle changes, such as drinking plenty of fluids and eating foods that are high in fiber. Emptying your bladder at scheduled times (bladder training therapy). This can help reduce or avoid urinary incontinence. Estrogen. This may help mild prolapse by increasing the strength and tone of pelvic floor muscles. Kegel exercises. These may help mild cases of prolapse by strengthening and tightening the muscles of the pelvic floor. A soft, flexible device that helps support the vaginal walls and keep pelvic organs in place (pessary). This is inserted into your vagina by your health care provider. Surgery. This is often the only form of treatment for severe prolapse. Follow these instructions at home: Eating and drinking Avoid drinking beverages that contain caffeine or alcohol. Increase your intake of high-fiber foods to decrease constipation and straining during bowel movements. Activity Lose weight if recommended by your health care provider. Avoid heavy lifting and straining with exercise and work. Do not hold your breath when you perform mild to moderate lifting and exercise activities. Limit your activities as directed by your health care provider. Do Kegel exercises as directed by your health care provider. To do this: Squeeze your pelvic floor muscles tight. You should feel a tight lift in your rectal area and a tightness in your vaginal area. Keep your stomach, buttocks, and legs relaxed. Hold the muscles tight for up to 10 seconds. Then relax your muscles. Repeat this exercise 50 times a day, or as much as told by your health care provider. Continue to do this exercise for at least 4-6 weeks, or for as long as told by your health care provider. General instructions  Take over-the-counter and prescription medicines only as told by  your health care provider. Wear a sanitary pad or adult diapers if you have urinary incontinence. If you have a pessary, take care of it as told by your health care provider. Keep all follow-up visits. This is important. Contact a health care provider if you: Have symptoms that interfere with your daily activities or sex life. Need medicine to help with the discomfort. Notice bleeding from your vagina that is not related to your menstrual period. Have a fever. Have pain or bleeding when you urinate. Have bleeding when you pass stool. Pass urine when you have sex. Have chronic constipation. Have a pessary that falls out. Have a foul-smelling vaginal discharge. Have an unusual, low pain in your abdomen. Get help right away if you: Cannot pass urine. Summary Pelvic organ prolapse is the stretching, bulging, or dropping of pelvic organs into an abnormal position. It happens when the muscles and tissues that surround and support pelvic structures become weak or stretched. When organs other than the vagina are involved, they often bulge into the vagina or protrude from it, depending on how severe the prolapse is. In most cases, this condition needs to be treated only if it produces symptoms. Treatment may include lifestyle changes, estrogen, Kegel exercises, pessary insertion, or surgery. Avoid heavy lifting and straining with exercise and work. Do not hold your breath when you perform mild to moderate lifting and exercise activities. Limit your activities as directed by your health care provider. This information is not intended to replace advice given to you by your health care provider. Make sure you discuss any questions you have with your healthcare provider. Document Revised: 07/13/2019 Document Reviewed: 07/13/2019 Elsevier Patient Education  [place].

## 2020-09-27 ENCOUNTER — Other Ambulatory Visit: Payer: Self-pay | Admitting: Cardiovascular Disease

## 2020-09-29 DIAGNOSIS — M2042 Other hammer toe(s) (acquired), left foot: Secondary | ICD-10-CM | POA: Diagnosis not present

## 2020-09-29 DIAGNOSIS — M2041 Other hammer toe(s) (acquired), right foot: Secondary | ICD-10-CM | POA: Diagnosis not present

## 2020-09-29 DIAGNOSIS — M79671 Pain in right foot: Secondary | ICD-10-CM | POA: Diagnosis not present

## 2020-09-29 DIAGNOSIS — M2142 Flat foot [pes planus] (acquired), left foot: Secondary | ICD-10-CM | POA: Diagnosis not present

## 2020-09-29 DIAGNOSIS — M2141 Flat foot [pes planus] (acquired), right foot: Secondary | ICD-10-CM | POA: Diagnosis not present

## 2020-09-29 DIAGNOSIS — M79672 Pain in left foot: Secondary | ICD-10-CM | POA: Diagnosis not present

## 2020-10-12 ENCOUNTER — Encounter: Payer: Self-pay | Admitting: *Deleted

## 2020-10-12 DIAGNOSIS — M533 Sacrococcygeal disorders, not elsewhere classified: Secondary | ICD-10-CM | POA: Diagnosis not present

## 2020-10-12 DIAGNOSIS — G894 Chronic pain syndrome: Secondary | ICD-10-CM | POA: Diagnosis not present

## 2020-10-12 DIAGNOSIS — M545 Low back pain, unspecified: Secondary | ICD-10-CM | POA: Diagnosis not present

## 2020-10-12 DIAGNOSIS — Z79899 Other long term (current) drug therapy: Secondary | ICD-10-CM | POA: Diagnosis not present

## 2020-10-12 DIAGNOSIS — R202 Paresthesia of skin: Secondary | ICD-10-CM | POA: Diagnosis not present

## 2020-10-12 DIAGNOSIS — M62830 Muscle spasm of back: Secondary | ICD-10-CM | POA: Diagnosis not present

## 2020-10-12 DIAGNOSIS — M47817 Spondylosis without myelopathy or radiculopathy, lumbosacral region: Secondary | ICD-10-CM | POA: Diagnosis not present

## 2020-10-13 ENCOUNTER — Encounter: Payer: Self-pay | Admitting: Anesthesiology

## 2020-10-13 ENCOUNTER — Ambulatory Visit
Admission: RE | Admit: 2020-10-13 | Discharge: 2020-10-13 | Disposition: A | Discharge status: Home / Self Care | Payer: Medicare HMO | Attending: Gastroenterology | Admitting: Gastroenterology

## 2020-10-13 ENCOUNTER — Ambulatory Visit: Payer: Medicare HMO | Admitting: Anesthesiology

## 2020-10-13 ENCOUNTER — Encounter
Admission: RE | Disposition: A | Discharge status: Home / Self Care | Payer: Self-pay | Source: Home / Self Care | Attending: Gastroenterology

## 2020-10-13 ENCOUNTER — Other Ambulatory Visit: Payer: Self-pay

## 2020-10-13 DIAGNOSIS — G4730 Sleep apnea, unspecified: Secondary | ICD-10-CM | POA: Insufficient documentation

## 2020-10-13 DIAGNOSIS — Z1211 Encounter for screening for malignant neoplasm of colon: Secondary | ICD-10-CM | POA: Insufficient documentation

## 2020-10-13 DIAGNOSIS — K640 First degree hemorrhoids: Secondary | ICD-10-CM | POA: Insufficient documentation

## 2020-10-13 DIAGNOSIS — E785 Hyperlipidemia, unspecified: Secondary | ICD-10-CM | POA: Diagnosis not present

## 2020-10-13 DIAGNOSIS — Z90722 Acquired absence of ovaries, bilateral: Secondary | ICD-10-CM | POA: Insufficient documentation

## 2020-10-13 DIAGNOSIS — Z881 Allergy status to other antibiotic agents status: Secondary | ICD-10-CM | POA: Insufficient documentation

## 2020-10-13 DIAGNOSIS — Z800 Family history of malignant neoplasm of digestive organs: Secondary | ICD-10-CM | POA: Insufficient documentation

## 2020-10-13 DIAGNOSIS — D123 Benign neoplasm of transverse colon: Secondary | ICD-10-CM | POA: Insufficient documentation

## 2020-10-13 DIAGNOSIS — I10 Essential (primary) hypertension: Secondary | ICD-10-CM | POA: Insufficient documentation

## 2020-10-13 DIAGNOSIS — Z882 Allergy status to sulfonamides status: Secondary | ICD-10-CM | POA: Insufficient documentation

## 2020-10-13 DIAGNOSIS — I428 Other cardiomyopathies: Secondary | ICD-10-CM | POA: Diagnosis not present

## 2020-10-13 DIAGNOSIS — Z95810 Presence of automatic (implantable) cardiac defibrillator: Secondary | ICD-10-CM | POA: Diagnosis not present

## 2020-10-13 DIAGNOSIS — E7800 Pure hypercholesterolemia, unspecified: Secondary | ICD-10-CM | POA: Insufficient documentation

## 2020-10-13 DIAGNOSIS — Z8371 Family history of colonic polyps: Secondary | ICD-10-CM | POA: Diagnosis not present

## 2020-10-13 DIAGNOSIS — Z951 Presence of aortocoronary bypass graft: Secondary | ICD-10-CM | POA: Diagnosis not present

## 2020-10-13 DIAGNOSIS — Z90710 Acquired absence of both cervix and uterus: Secondary | ICD-10-CM | POA: Diagnosis not present

## 2020-10-13 DIAGNOSIS — E039 Hypothyroidism, unspecified: Secondary | ICD-10-CM | POA: Diagnosis not present

## 2020-10-13 DIAGNOSIS — D120 Benign neoplasm of cecum: Secondary | ICD-10-CM | POA: Insufficient documentation

## 2020-10-13 DIAGNOSIS — K5790 Diverticulosis of intestine, part unspecified, without perforation or abscess without bleeding: Secondary | ICD-10-CM | POA: Diagnosis not present

## 2020-10-13 DIAGNOSIS — K219 Gastro-esophageal reflux disease without esophagitis: Secondary | ICD-10-CM | POA: Insufficient documentation

## 2020-10-13 DIAGNOSIS — K5730 Diverticulosis of large intestine without perforation or abscess without bleeding: Secondary | ICD-10-CM | POA: Insufficient documentation

## 2020-10-13 DIAGNOSIS — Z79899 Other long term (current) drug therapy: Secondary | ICD-10-CM | POA: Diagnosis not present

## 2020-10-13 DIAGNOSIS — K649 Unspecified hemorrhoids: Secondary | ICD-10-CM | POA: Diagnosis not present

## 2020-10-13 DIAGNOSIS — Z7982 Long term (current) use of aspirin: Secondary | ICD-10-CM | POA: Diagnosis not present

## 2020-10-13 DIAGNOSIS — K635 Polyp of colon: Secondary | ICD-10-CM | POA: Diagnosis not present

## 2020-10-13 HISTORY — PX: COLONOSCOPY: SHX5424

## 2020-10-13 SURGERY — COLONOSCOPY
Anesthesia: General

## 2020-10-13 MED ORDER — PROPOFOL 500 MG/50ML IV EMUL
INTRAVENOUS | Status: DC | PRN
  Administered 2020-10-13: 150 ug/kg/min via INTRAVENOUS

## 2020-10-13 MED ORDER — EPHEDRINE 5 MG/ML INJ
INTRAVENOUS | Status: AC
  Filled 2020-10-13: qty 5

## 2020-10-13 MED ORDER — PROPOFOL 500 MG/50ML IV EMUL
INTRAVENOUS | Status: AC
  Filled 2020-10-13: qty 50

## 2020-10-13 MED ORDER — SODIUM CHLORIDE 0.9 % IV SOLN: INTRAVENOUS | Status: DC

## 2020-10-13 MED ORDER — EPHEDRINE SULFATE 50 MG/ML IJ SOLN
INTRAMUSCULAR | Status: DC | PRN
  Administered 2020-10-13 (×2): 10 mg via INTRAVENOUS

## 2020-10-13 NOTE — Interval H&P Note (Signed)
History and Physical Interval Note: Preprocedure H&P from 10/13/20  was reviewed and there was no interval change after seeing and examining the patient.  Written consent was obtained from the patient after discussion of risks, benefits, and alternatives. Patient has consented to proceed with Colonoscopy with possible intervention   10/13/2020 10:57 AM  Lisa Fuentes  has presented today for surgery, with the diagnosis of (Z80.0) FAMILY HX OF COLON CANCER (R19.4) CHANGE IN BOWEL HABITS  (R19.8) ALTERNATING CONSTIPATION AND DIARRHEA  (R63.4) UNINTENTIONAL WEIGHT LOSS.  The various methods of treatment have been discussed with the patient and family. After consideration of risks, benefits and other options for treatment, the patient has consented to  Procedure(s): COLONOSCOPY (N/A) as a surgical intervention.  The patient's history has been reviewed, patient examined, no change in status, stable for surgery.  I have reviewed the patient's chart and labs.  Questions were answered to the patient's satisfaction.     Annamaria Helling

## 2020-10-13 NOTE — Transfer of Care (Signed)
Immediate Anesthesia Transfer of Care Note  Patient: Lisa Fuentes  Procedure(s) Performed: COLONOSCOPY  Patient Location: PACU  Anesthesia Type:General  Level of Consciousness: awake and sedated  Airway & Oxygen Therapy: Patient Spontanous Breathing and Patient connected to face mask oxygen  Post-op Assessment: Report given to RN and Post -op Vital signs reviewed and stable  Post vital signs: Reviewed and stable  Last Vitals:  Vitals Value Taken Time  BP    Temp    Pulse    Resp    SpO2      Last Pain:  Vitals:   10/13/20 0954  TempSrc: Temporal  PainSc: 0-No pain         Complications: No notable events documented.

## 2020-10-13 NOTE — H&P (Signed)
[facility] Pre-Procedure H&P   Patient ID: Lisa Fuentes is a 60 y.o. female.  Gastroenterology Provider: Annamaria Helling, DO  Referring Provider: Octavia Bruckner, PA PCP: Maryland Pink, MD  Date: 10/13/2020  HPI Ms. Lisa Fuentes is a 60 y.o. female who presents today for Colonoscopy for surveillance in high risk individual. Family history of colon polyps and colon cancer. Personal history of colon polyps.  Over the last 6 months she has had alternating constipation and diarrhea. Has LLQ pain that resolves with bm. Has been noting blood with wiping but no melena or true hematochezia. Weight loss from 267-244. Appetite maintained. No other acute gi complaints.  Past Medical History:  Diagnosis Date   Anemia    Anxiety    panic attacks   Arthritis    Clotting disorder ([place])    Depression    Difficult intubation    Endometriosis of vagina 09/2013   Intra-operative findings of endometriosis implants on cervical stump   GERD (gastroesophageal reflux disease)    Headache    Hypercholesteremia    Hypertension    Hypokalemia    Hypothyroidism    Implantable defibrillator    Medtronic DOI 2008, replacement 2015   Menopausal symptoms    Nonischemic cardiomyopathy (HCC)    Interval improvement EF 40-45% 1/16   Shortness of breath dyspnea    chronic doe   Sleep apnea    Treated with CPAP   Tachycardia     Past Surgical History:  Procedure Laterality Date   ANTERIOR AND POSTERIOR REPAIR N/A 11/25/2017   Procedure: ANTERIOR (CYSTOCELE) AND POSTERIOR REPAIR (RECTOCELE);  Surgeon: Rubie Maid, MD;  Location: [facility] ORS;  Service: Gynecology;  Laterality: N/A;   BREAST CYST ASPIRATION Left 03/22/2014   neg/ done by Dr Jamal Collin   CARDIAC CATHETERIZATION  2003   [place]: No significant coronary artery disease with reduced ejection fraction.   CARDIAC DEFIBRILLATOR PLACEMENT     CARPAL TUNNEL RELEASE Right    COLONOSCOPY  2015   COLONOSCOPY WITH PROPOFOL N/A  10/04/2015   Procedure: COLONOSCOPY WITH PROPOFOL;  Surgeon: Lucilla Lame, MD;  Location: [facility] ENDOS[facility];  Service: Endoscopy;  Laterality: N/A;   CORONARY ANGIOPLASTY     CORONARY ARTERY BYPASS GRAFT     CYSTOSCOPY  11/15/2014   Procedure: CYSTOSCOPY;  Surgeon: Rubie Maid, MD;  Location: [facility] ORS;  Service: Gynecology;;   DIAGNOSTIC LAPAROSCOPY     FOOT SURGERY     HAND SURGERY     KNEE ARTHROSCOPY Left 03/02/2015   Procedure: ARTHROSCOPY  LEFT KNEE, PARTIAL LATERAL  MENISECTOMY, SYNOVECTOMY, MEDIAL & LATERAL CHONDROPLASTY;  Surgeon: Thornton Park, MD;  Location: [facility] ORS;  Service: Orthopedics;  Laterality: Left;  Dr. Mack Guise wanted her surgery switched from [facility] because of the Defibrillator.  patient has a Defibrillator  takes Asprin/knows to stop 5days before  has had some test done   LAPAROSCOPIC SALPINGO OOPHERECTOMY Left 11/15/2014   Procedure: LAPAROSCOPIC OOPHORECTOMY;  Surgeon: Rubie Maid, MD;  Location: [facility] ORS;  Service: Gynecology;  Laterality: Left;   LAPAROSCOPIC SALPINGOOPHERECTOMY Right 09/2013   also with left salpingectomy   LEFT HEART CATH AND CORONARY ANGIOGRAPHY N/A 03/28/2020   Procedure: LEFT HEART CATH AND CORONARY ANGIOGRAPHY;  Surgeon: Wellington Hampshire, MD;  Location: [place] [facility];  Service: Cardiovascular;  Laterality: N/A;   TONSILLECTOMY     TOTAL ABDOMINAL HYSTERECTOMY     TRACHELECTOMY N/A 11/15/2014   Procedure: TRACHELECTOMY;  Surgeon: Rubie Maid, MD;  Location: [facility]  ORS;  Service: Gynecology;  Laterality: N/A;   TUBAL LIGATION Bilateral     Family History Mother- colon cancer at 24+ y/o (deceased) Brother- throat cancer (laryngeal) Sister- ovarian ca No other h/o GI disease or malignancy  Review of Systems  Constitutional:  Positive for unexpected weight change (20 lbs in several months). Negative for activity change, appetite change, fatigue and fever.  HENT:  Negative for trouble swallowing and voice change.    Respiratory:  Negative for shortness of breath and wheezing.   Cardiovascular:  Negative for chest pain and palpitations.  Gastrointestinal:  Positive for abdominal pain, blood in stool, constipation and diarrhea. Negative for abdominal distention, anal bleeding, nausea, rectal pain and vomiting.  Musculoskeletal:  Negative for arthralgias and myalgias.  Skin:  Negative for color change and pallor.  Neurological:  Negative for dizziness, syncope and weakness.  Psychiatric/Behavioral:  Negative for confusion.   All other systems reviewed and are negative.   Medications No current facility-administered medications on file prior to encounter.   Current Outpatient Medications on File Prior to Encounter  Medication Sig Dispense Refill   aspirin EC 81 MG tablet Take 1 tablet (81 mg total) by mouth daily.     cetirizine (ZYRTEC) 10 MG tablet Take 10 mg by mouth daily.     cyclobenzaprine (FLEXERIL) 10 MG tablet Take 10 mg by mouth 3 (three) times daily as needed for muscle spasms.     empagliflozin (JARDIANCE) 10 MG TABS tablet Take 1 tablet (10 mg total) by mouth daily before breakfast. 30 tablet 5   esomeprazole (NEXIUM) 40 MG capsule Take 40 mg by mouth every morning.      EUTHYROX 137 MCG tablet Take 137 mcg by mouth every morning.     fluticasone (FLONASE) 50 MCG/ACT nasal spray Place 1 spray into both nostrils daily.      furosemide (LASIX) 20 MG tablet Take 1 tablet (20 mg total) by mouth daily. *PATIENT OCCASIONALLY TAKES AN EXTRA TABLET* 90 tablet 1   losartan (COZAAR) 25 MG tablet Take 0.5 tablets (12.5 mg total) by mouth daily. 45 tablet 2   metoprolol succinate (TOPROL XL) 50 MG 24 hr tablet Take 1 tablet (50 mg total) by mouth daily. Take with or immediately following a meal. 90 tablet 1   nystatin (MYCOSTATIN) powder Apply 1 Bottle topically 4 (four) times daily as needed (irritaton). Reported on 08/18/2015     nystatin cream (MYCOSTATIN) Apply topically 2 (two) times daily.      oxyCODONE-acetaminophen (PERCOCET/ROXICET) 5-325 MG tablet Take by mouth every 4 (four) hours as needed for severe pain.     sertraline (ZOLOFT) 100 MG tablet Take 100 mg by mouth daily.      Pertinent medications related to GI and procedure were reviewed by me with the patient prior to the procedure   Current Facility-Administered Medications:    0.9 %  sodium chloride infusion, , Intravenous, Continuous, Annamaria Helling, DO, Last Rate: 20 mL/hr at 10/13/20 1013, Continued from Pre-op at 10/13/20 1013  sodium chloride 20 mL/hr at 10/13/20 1013       Allergies  Allergen Reactions   Sulfa Antibiotics Other (See Comments)   Sulfamethoxazole-Trimethoprim Hives   Allergies were reviewed by me prior to the procedure  Objective    Vitals:   10/13/20 0954  BP: 119/80  Pulse: 82  Resp: 18  Temp: (!) 97.4 F (36.3 C)  TempSrc: Temporal  SpO2: 100%  Weight: 109.8 kg  Height: '5\' 7"'$  (1.702 m)  Physical Exam Vitals reviewed.  Constitutional:      General: She is not in acute distress.    Appearance: Normal appearance. She is obese. She is not ill-appearing, toxic-appearing or diaphoretic.  HENT:     Head: Normocephalic and atraumatic.     Nose: Nose normal.     Mouth/Throat:     Mouth: Mucous membranes are moist.     Pharynx: Oropharynx is clear.  Eyes:     General: No scleral icterus.    Extraocular Movements: Extraocular movements intact.  Cardiovascular:     Rate and Rhythm: Normal rate and regular rhythm.     Heart sounds: Normal heart sounds. No murmur heard.   No friction rub. No gallop.  Pulmonary:     Effort: Pulmonary effort is normal. No respiratory distress.     Breath sounds: Normal breath sounds. No wheezing, rhonchi or rales.  Abdominal:     General: Bowel sounds are normal. There is no distension.     Palpations: Abdomen is soft.     Tenderness: There is no abdominal tenderness. There is no guarding or rebound.  Musculoskeletal:     Cervical  back: Neck supple.     Right lower leg: No edema.     Left lower leg: No edema.  Skin:    General: Skin is warm and dry.     Coloration: Skin is not jaundiced or pale.  Neurological:     General: No focal deficit present.     Mental Status: She is alert and oriented to person, place, and time. Mental status is at baseline.  Psychiatric:        Mood and Affect: Mood normal.        Behavior: Behavior normal.        Thought Content: Thought content normal.        Judgment: Judgment normal.     Assessment:  Ms. Lisa Fuentes is a 60 y.o. female  who presents today for Colonoscopy for surveillance in high risk individual. Family history of colon polyps and colon cancer. Personal history of colon polyps  Plan:  Colonoscopy with possible intervention today  Colonoscopy with possible biopsy, control of bleeding, polypectomy, and interventions as necessary has been discussed with the patient/patient representative. Informed consent was obtained from the patient/patient representative after explaining the indication, nature, and risks of the procedure including but not limited to death, bleeding, perforation, missed neoplasm/lesions, cardiorespiratory compromise, and reaction to medications. Opportunity for questions was given and appropriate answers were provided. Patient/patient representative has verbalized understanding is amenable to undergoing the procedure.   Annamaria Helling, DO  [facility]  Portions of the record may have been created with voice recognition software. Occasional wrong-word or 'sound-a-like' substitutions may have occurred due to the inherent limitations of voice recognition software.  Read the chart carefully and recognize, using context, where substitutions may have occurred.

## 2020-10-13 NOTE — Anesthesia Procedure Notes (Signed)
Date/Time: 10/13/2020 11:12 AM Performed by: Vaughan Sine Pre-anesthesia Checklist: Patient identified, Emergency Drugs available, Suction available, Patient being monitored and Timeout performed Patient Re-evaluated:Patient Re-evaluated prior to induction Oxygen Delivery Method: Supernova nasal CPAP Preoxygenation: Pre-oxygenation with 100% oxygen Induction Type: IV induction Placement Confirmation: positive ETCO2 and CO2 detector

## 2020-10-13 NOTE — Anesthesia Preprocedure Evaluation (Signed)
Anesthesia Evaluation  Patient identified by MRN, date of birth, ID band Patient awake    Reviewed: Allergy & Precautions, NPO status , Patient's Chart, lab work & pertinent test results  Airway Mallampati: III  TM Distance: >3 FB Neck ROM: full    Dental  (+) Partial Lower, Partial Upper   Pulmonary neg pulmonary ROS,    Pulmonary exam normal        Cardiovascular hypertension, negative cardio ROS Normal cardiovascular exam     Neuro/Psych  Headaches, PSYCHIATRIC DISORDERS Anxiety Depression negative neurological ROS  negative psych ROS   GI/Hepatic negative GI ROS, Neg liver ROS, GERD  Medicated,  Endo/Other  negative endocrine ROSHypothyroidism   Renal/GU negative Renal ROS  negative genitourinary   Musculoskeletal   Abdominal (+) + obese,   Peds  Hematology negative hematology ROS (+) Blood dyscrasia, anemia ,   Anesthesia Other Findings Past Medical History: No date: Anemia No date: Anxiety     Comment:  panic attacks No date: Arthritis No date: Clotting disorder ([place]) No date: Depression No date: Difficult intubation 09/2013: Endometriosis of vagina     Comment:  Intra-operative findings of endometriosis implants on               cervical stump No date: GERD (gastroesophageal reflux disease) No date: Headache No date: Hypercholesteremia No date: Hypertension No date: Hypokalemia No date: Hypothyroidism No date: Implantable defibrillator     Comment:  Medtronic DOI 2008, replacement 2015 No date: Menopausal symptoms No date: Nonischemic cardiomyopathy (HCC)     Comment:  Interval improvement EF 40-45% 1/16 No date: Shortness of breath dyspnea     Comment:  chronic doe No date: Sleep apnea     Comment:  Treated with CPAP No date: Tachycardia  Past Surgical History: 11/25/2017: ANTERIOR AND POSTERIOR REPAIR; N/A     Comment:  Procedure: ANTERIOR (CYSTOCELE) AND POSTERIOR REPAIR                (RECTOCELE);  Surgeon: Rubie Maid, MD;  Location: [facility]              ORS;  Service: Gynecology;  Laterality: N/A; 03/22/2014: BREAST CYST ASPIRATION; Left     Comment:  neg/ done by Dr Jamal Collin 2003: CARDIAC CATHETERIZATION     Comment:  [facility]: No significant coronary artery disease with               reduced ejection fraction. No date: CARDIAC DEFIBRILLATOR PLACEMENT No date: CARPAL TUNNEL RELEASE; Right 2015: COLONOSCOPY 10/04/2015: COLONOSCOPY WITH PROPOFOL; N/A     Comment:  Procedure: COLONOSCOPY WITH PROPOFOL;  Surgeon: Lucilla Lame, MD;  Location: [facility] ENDOSCOPY;  Service: Endoscopy;              Laterality: N/A; No date: CORONARY ANGIOPLASTY No date: CORONARY ARTERY BYPASS GRAFT 11/15/2014: CYSTOSCOPY     Comment:  Procedure: CYSTOSCOPY;  Surgeon: Rubie Maid, MD;                Location: [facility] ORS;  Service: Gynecology;; No date: DIAGNOSTIC LAPAROSCOPY No date: FOOT SURGERY No date: HAND SURGERY 03/02/2015: KNEE ARTHROSCOPY; Left     Comment:  Procedure: ARTHROSCOPY  LEFT KNEE, PARTIAL LATERAL                MENISECTOMY, SYNOVECTOMY, MEDIAL & LATERAL CHONDROPLASTY;              Surgeon: Thornton Park, MD;  Location: [facility] ORS;                Service: Orthopedics;  Laterality: Left;  Dr. Mack Guise               wanted her surgery switched from [facility]               because of the Defibrillator.  patient has a               Defibrillator  takes Asprin/knows to stop 5days before                has had some test done 11/15/2014: LAPAROSCOPIC SALPINGO OOPHERECTOMY; Left     Comment:  Procedure: LAPAROSCOPIC OOPHORECTOMY;  Surgeon: Rubie Maid, MD;  Location: [facility] ORS;  Service: Gynecology;                Laterality: Left; 09/2013: LAPAROSCOPIC SALPINGOOPHERECTOMY; Right     Comment:  also with left salpingectomy 03/28/2020: LEFT HEART CATH AND CORONARY ANGIOGRAPHY; N/A     Comment:  Procedure: LEFT HEART CATH AND CORONARY  ANGIOGRAPHY;                Surgeon: Wellington Hampshire, MD;  Location: [place]               CV [facility];  Service: Cardiovascular;  Laterality: N/A; No date: TONSILLECTOMY No date: TOTAL ABDOMINAL HYSTERECTOMY 11/15/2014: TRACHELECTOMY; N/A     Comment:  Procedure: TRACHELECTOMY;  Surgeon: Rubie Maid, MD;                Location: [facility] ORS;  Service: Gynecology;  Laterality:               N/A; No date: TUBAL LIGATION; Bilateral  BMI    Body Mass Index: 37.90 kg/m      Reproductive/Obstetrics negative OB ROS                             Anesthesia Physical Anesthesia Plan  ASA: 3  Anesthesia Plan: General   Post-op Pain Management:    Induction: Intravenous  PONV Risk Score and Plan: Propofol infusion and TIVA  Airway Management Planned: Natural Airway and Nasal Cannula  Additional Equipment:   Intra-op Plan:   Post-operative Plan:   Informed Consent: I have reviewed the patients History and Physical, chart, labs and discussed the procedure including the risks, benefits and alternatives for the proposed anesthesia with the patient or authorized representative who has indicated his/her understanding and acceptance.     Dental Advisory Given  Plan Discussed with: Anesthesiologist, CRNA and Surgeon  Anesthesia Plan Comments: (Patient consented for risks of anesthesia including but not limited to:  - adverse reactions to medications - risk of airway placement if required - damage to eyes, teeth, lips or other oral mucosa - nerve damage due to positioning  - sore throat or hoarseness - Damage to heart, brain, nerves, lungs, other parts of body or loss of life  Patient voiced understanding.)        Anesthesia Quick Evaluation

## 2020-10-13 NOTE — Anesthesia Postprocedure Evaluation (Signed)
Anesthesia Post Note  Patient: Thai Gebre [place]  Procedure(s) Performed: COLONOSCOPY  Patient location during evaluation: Endoscopy Anesthesia Type: General Level of consciousness: awake and alert Pain management: pain level controlled Vital Signs Assessment: post-procedure vital signs reviewed and stable Respiratory status: spontaneous breathing, nonlabored ventilation, respiratory function stable and patient connected to nasal cannula oxygen Cardiovascular status: blood pressure returned to baseline and stable Postop Assessment: no apparent nausea or vomiting Anesthetic complications: no   No notable events documented.   Last Vitals:  Vitals:   10/13/20 1134 10/13/20 1144  BP: (!) 101/54 (!) 92/47  Pulse: 92 86  Resp: 18 (!) 23  Temp:    SpO2: 100% 100%    Last Pain:  Vitals:   10/13/20 1144  TempSrc:   PainSc: 0-No pain                 Margaree Mackintosh

## 2020-10-13 NOTE — Op Note (Signed)
[facility] Patient Name: Lisa Fuentes Procedure Date: 10/13/2020 10:58 AM MRN: [id number] Account #: [id number] Date of Birth: [birth year] Admit Type: Outpatient Age: 60 Room: [facility] ENDO ROOM 1 Gender: Female Note Status: Finalized Instrument Name: Park Meo [id number] Procedure:             Colonoscopy Indications:           Screening patient at increased risk: Family history of                         1st-degree relative with colorectal cancer at age 9                         years (or older), High risk colon cancer surveillance:                         Personal history of colonic polyps, Family history of                         advanced adenoma of the colon in a first-degree                         relative before age 64 years Providers:             Rueben Bash, DO Referring MD:          Irven Easterly. Kary Kos, MD (Referring MD) Medicines:             Monitored Anesthesia Care Complications:         No immediate complications. Estimated blood loss:                         Minimal. Procedure:             Pre-Anesthesia Assessment:                        - Prior to the procedure, a History and Physical was                         performed, and patient medications and allergies were                         reviewed. The patient is competent. The risks and                         benefits of the procedure and the sedation options and                         risks were discussed with the patient. All questions                         were answered and informed consent was obtained.                         Patient identification and proposed procedure were                         verified by the physician, the nurse, the anesthetist  and the technician in the endoscopy suite. Mental                         Status Examination: alert and oriented. Airway                         Examination: normal oropharyngeal airway and  neck                         mobility. Respiratory Examination: clear to                         auscultation. CV Examination: normal. Prophylactic                         Antibiotics: The patient does not require prophylactic                         antibiotics. Prior Anticoagulants: The patient has                         taken no previous anticoagulant or antiplatelet                         agents. ASA Grade Assessment: III - A patient with                         severe systemic disease. After reviewing the risks and                         benefits, the patient was deemed in satisfactory                         condition to undergo the procedure. The anesthesia                         plan was to use monitored anesthesia care (MAC).                         Immediately prior to administration of medications,                         the patient was re-assessed for adequacy to receive                         sedatives. The heart rate, respiratory rate, oxygen                         saturations, blood pressure, adequacy of pulmonary                         ventilation, and response to care were monitored                         throughout the procedure. The physical status of the                         patient was re-assessed after the procedure.  After obtaining informed consent, the colonoscope was                         passed under direct vision. Throughout the procedure,                         the patient's blood pressure, pulse, and oxygen                         saturations were monitored continuously. The                         Colonoscope was introduced through the anus and                         advanced to the the cecum, identified by appendiceal                         orifice and ileocecal valve. The colonoscopy was                         performed without difficulty. The patient tolerated                         the procedure well. The quality of  the bowel                         preparation was evaluated using the BBPS [facility] Bowel                         Preparation Scale) with scores of: Right Colon = 2                         (minor amount of residual staining, small fragments of                         stool and/or opaque liquid, but mucosa seen well),                         Transverse Colon = 3 (entire mucosa seen well with no                         residual staining, small fragments of stool or opaque                         liquid) and Left Colon = 3 (entire mucosa seen well                         with no residual staining, small fragments of stool or                         opaque liquid). The total BBPS score equals 8. The                         quality of the bowel preparation was excellent. The  ileocecal valve, appendiceal orifice, and rectum were                         photographed. Findings:      The perianal and digital rectal examinations were normal. Pertinent       negatives include normal sphincter tone.      Three sessile polyps were found in the transverse colon and cecum. The       polyps were 2 to 3 mm in size. These polyps were removed with a cold       biopsy forceps. Resection and retrieval were complete. Estimated blood       loss was minimal.      A few small-mouthed diverticula were found in the left colon. Estimated       blood loss: none.      Non-bleeding internal hemorrhoids were found during retroflexion. The       hemorrhoids were Grade I (internal hemorrhoids that do not prolapse).      The exam was otherwise without abnormality on direct and retroflexion       views. Impression:            - Three 2 to 3 mm polyps in the transverse colon and                         in the cecum, removed with a cold biopsy forceps.                         Resected and retrieved.                        - Diverticulosis in the left colon.                        - Non-bleeding  internal hemorrhoids.                        - The examination was otherwise normal on direct and                         retroflexion views. Recommendation:        - Discharge patient to home.                        - Resume previous diet.                        - Continue present medications.                        - Await pathology results.                        - Repeat colonoscopy for surveillance based on                         pathology results.                        - Return to referring physician as previously                         scheduled.  Procedure Code(s):     --- Professional ---                        [phone], Colonoscopy, flexible; with biopsy, single or                         multiple Diagnosis Code(s):     --- Professional ---                        Z80.0, Family history of malignant neoplasm of                         digestive organs                        Z86.010, Personal history of colonic polyps                        K63.5, Polyp of colon                        K64.0, First degree hemorrhoids                        Z83.71, Family history of colonic polyps                        K57.30, Diverticulosis of large intestine without                         perforation or abscess without bleeding CPT copyright 2019 [employer]. All rights reserved. The codes documented in this report are preliminary and upon coder review may  be revised to meet current compliance requirements. Attending Participation:      I personally performed the entire procedure. Volney American, DO Annamaria Helling DO, DO 10/13/2020 11:36:37 AM This report has been signed electronically. Number of Addenda: 0 Note Initiated On: 10/13/2020 10:58 AM Scope Withdrawal Time: 0 hours 14 minutes 0 seconds  Total Procedure Duration: 0 hours 23 minutes 55 seconds  Estimated Blood Loss:  Estimated blood loss was minimal.      [facility]

## 2020-10-14 LAB — SURGICAL PATHOLOGY

## 2020-10-18 ENCOUNTER — Encounter: Payer: Medicare HMO | Admitting: Obstetrics and Gynecology

## 2020-10-18 DIAGNOSIS — Z4689 Encounter for fitting and adjustment of other specified devices: Secondary | ICD-10-CM

## 2020-10-19 ENCOUNTER — Encounter: Payer: Self-pay | Admitting: Obstetrics and Gynecology

## 2020-10-19 DIAGNOSIS — M545 Low back pain, unspecified: Secondary | ICD-10-CM | POA: Diagnosis not present

## 2020-10-27 ENCOUNTER — Ambulatory Visit: Payer: Medicare HMO | Admitting: Cardiovascular Disease

## 2020-10-27 ENCOUNTER — Other Ambulatory Visit: Payer: Self-pay

## 2020-10-27 ENCOUNTER — Encounter: Payer: Self-pay | Admitting: Cardiovascular Disease

## 2020-10-27 VITALS — BP 100/60 | HR 83 | Ht 67.0 in | Wt 247.0 lb

## 2020-10-27 DIAGNOSIS — I5022 Chronic systolic (congestive) heart failure: Secondary | ICD-10-CM | POA: Diagnosis not present

## 2020-10-27 DIAGNOSIS — Z95810 Presence of automatic (implantable) cardiac defibrillator: Secondary | ICD-10-CM | POA: Diagnosis not present

## 2020-10-27 DIAGNOSIS — I472 Ventricular tachycardia: Secondary | ICD-10-CM

## 2020-10-27 DIAGNOSIS — I4729 Other ventricular tachycardia: Secondary | ICD-10-CM

## 2020-10-27 MED ORDER — FUROSEMIDE 20 MG PO TABS: 20.0000 mg | ORAL_TABLET | Freq: Every day | ORAL | 1 refills | Status: DC

## 2020-10-27 NOTE — Progress Notes (Signed)
Cardiology Office Note   Date:  10/27/2020   ID:  Lisa Fuentes, DOB 1960-06-12, MRN [id number]  PCP:  Maryland Pink, MD  Cardiologist:   [name], MD   Chief Complaint  Patient presents with   Other    3 month f/u no complaints today. Meds reviewed verbally with pt.      History of Present Illness: Lisa Fuentes is a 60 y.o. female who presents for a follow-up visit regarding chronic systolic heart failure due to nonischemic cardiomyopathy. She has known history of chronic systolic heart failure diagnosed in early 2000. She had cardiac catheterization done in 2003 which showed no significant coronary artery disease. She had an ICD placement in 2008 with generator replacement in May 2015. She has known history of sleep apnea, hypertension and hyperthyroidism. She received radioactive iodine treatment and currently is on thyroid replacement therapy. Most recent nuclear stress test in January 2017  showed no evidence of ischemia with ejection fraction of 49%. Most recent echocardiogram in February of 2022 showed an EF of 45 to 50%. She underwent cardiac CTA in April of 2021 which showed calcium score of 0 with no evidence of coronary artery disease.  She had recurrent chest pain earlier this year.  She underwent a Lexiscan Myoview in February which was suggestive of ischemia in the LAD distribution.  Thus, cardiac catheterization was performed which showed normal coronary arteries with mildly elevated left ventricular end-diastolic pressure.  She was switched from carvedilol to Toprol due to ease of administration.  During last visit, I added Jardiance 10 mg daily.  She has been doing well with this but it does cost her $45 per month as her co-pay.  She denies chest pain, shortness of breath or palpitations.  Past Medical History:  Diagnosis Date   Anemia    Anxiety    panic attacks   Arthritis    Clotting disorder ([place])    Depression    Difficult intubation     Endometriosis of vagina 09/2013   Intra-operative findings of endometriosis implants on cervical stump   GERD (gastroesophageal reflux disease)    Headache    Hypercholesteremia    Hypertension    Hypokalemia    Hypothyroidism    Implantable defibrillator    [employer] DOI 2008, replacement 2015   Menopausal symptoms    Nonischemic cardiomyopathy (HCC)    Interval improvement EF 40-45% 1/16   Shortness of breath dyspnea    chronic doe   Sleep apnea    Treated with CPAP   Tachycardia     Past Surgical History:  Procedure Laterality Date   ANTERIOR AND POSTERIOR REPAIR N/A 11/25/2017   Procedure: ANTERIOR (CYSTOCELE) AND POSTERIOR REPAIR (RECTOCELE);  Surgeon: Rubie Maid, MD;  Location: [facility] ORS;  Service: Gynecology;  Laterality: N/A;   BREAST CYST ASPIRATION Left 03/22/2014   neg/ done by Dr Jamal Collin   CARDIAC CATHETERIZATION  2003   [place]: No significant coronary artery disease with reduced ejection fraction.   CARDIAC DEFIBRILLATOR PLACEMENT     CARPAL TUNNEL RELEASE Right    COLONOSCOPY  2015   COLONOSCOPY N/A 10/13/2020   Procedure: COLONOSCOPY;  Surgeon: Annamaria Helling, DO;  Location: [facility] ENDOSCOPY;  Service: Gastroenterology;  Laterality: N/A;   COLONOSCOPY WITH PROPOFOL N/A 10/04/2015   Procedure: COLONOSCOPY WITH PROPOFOL;  Surgeon: Lucilla Lame, MD;  Location: [facility] ENDOSCOPY;  Service: Endoscopy;  Laterality: N/A;   CORONARY ANGIOPLASTY     CORONARY ARTERY BYPASS GRAFT  CYSTOSCOPY  11/15/2014   Procedure: CYSTOSCOPY;  Surgeon: Rubie Maid, MD;  Location: [facility] ORS;  Service: Gynecology;;   DIAGNOSTIC LAPAROSCOPY     FOOT SURGERY     HAND SURGERY     KNEE ARTHROSCOPY Left 03/02/2015   Procedure: ARTHROSCOPY  LEFT KNEE, PARTIAL LATERAL  MENISECTOMY, SYNOVECTOMY, MEDIAL & LATERAL CHONDROPLASTY;  Surgeon: Thornton Park, MD;  Location: [facility] ORS;  Service: Orthopedics;  Laterality: Left;  Dr. Mack Guise wanted her surgery switched from [facility]  because of the Defibrillator.  patient has a Defibrillator  takes Asprin/knows to stop 5days before  has had some test done   LAPAROSCOPIC SALPINGO OOPHERECTOMY Left 11/15/2014   Procedure: LAPAROSCOPIC OOPHORECTOMY;  Surgeon: Rubie Maid, MD;  Location: [facility] ORS;  Service: Gynecology;  Laterality: Left;   LAPAROSCOPIC SALPINGOOPHERECTOMY Right 09/2013   also with left salpingectomy   LEFT HEART CATH AND CORONARY ANGIOGRAPHY N/A 03/28/2020   Procedure: LEFT HEART CATH AND CORONARY ANGIOGRAPHY;  Surgeon: Wellington Hampshire, MD;  Location: [place] [facility];  Service: Cardiovascular;  Laterality: N/A;   TONSILLECTOMY     TOTAL ABDOMINAL HYSTERECTOMY     TRACHELECTOMY N/A 11/15/2014   Procedure: TRACHELECTOMY;  Surgeon: Rubie Maid, MD;  Location: [facility] ORS;  Service: Gynecology;  Laterality: N/A;   TUBAL LIGATION Bilateral      Current Outpatient Medications  Medication Sig Dispense Refill   aspirin EC 81 MG tablet Take 1 tablet (81 mg total) by mouth daily.     cetirizine (ZYRTEC) 10 MG tablet Take 10 mg by mouth daily.     cyclobenzaprine (FLEXERIL) 10 MG tablet Take 10 mg by mouth 3 (three) times daily as needed for muscle spasms.     empagliflozin (JARDIANCE) 10 MG TABS tablet Take 1 tablet (10 mg total) by mouth daily before breakfast. 30 tablet 5   esomeprazole (NEXIUM) 40 MG capsule Take 40 mg by mouth every morning.      EUTHYROX 137 MCG tablet Take 137 mcg by mouth every morning.     fluticasone (FLONASE) 50 MCG/ACT nasal spray Place 1 spray into both nostrils daily.      furosemide (LASIX) 20 MG tablet Take 1 tablet (20 mg total) by mouth daily. *PATIENT OCCASIONALLY TAKES AN EXTRA TABLET* 90 tablet 1   losartan (COZAAR) 25 MG tablet Take 0.5 tablets (12.5 mg total) by mouth daily. 45 tablet 2   metoprolol succinate (TOPROL XL) 50 MG 24 hr tablet Take 1 tablet (50 mg total) by mouth daily. Take with or immediately following a meal. 90 tablet 1   nystatin (MYCOSTATIN) powder  Apply 1 Bottle topically 4 (four) times daily as needed (irritaton). Reported on 08/18/2015     nystatin cream (MYCOSTATIN) Apply topically 2 (two) times daily.     oxyCODONE-acetaminophen (PERCOCET/ROXICET) 5-325 MG tablet Take by mouth every 4 (four) hours as needed for severe pain.     sertraline (ZOLOFT) 100 MG tablet Take 100 mg by mouth daily.     spironolactone (ALDACTONE) 25 MG tablet Take 1/2 (one-half) tablet by mouth once daily 45 tablet 3   No current facility-administered medications for this visit.    Allergies:   Sulfa antibiotics and Sulfamethoxazole-trimethoprim    Social History:  The patient  reports that she has never smoked. She has never used smokeless tobacco. She reports current alcohol use. She reports that she does not use drugs.   Family History:  The patient's family history includes Breast cancer (age of onset: 65) in her  sister; Colon cancer in her mother; Hyperlipidemia in her mother; Hypertension in her mother; Ovarian cancer in her sister; Stroke in her mother; Throat cancer in her brother.    ROS:  Please see the history of present illness.   Otherwise, review of systems are positive for none.   All other systems are reviewed and negative.    PHYSICAL EXAM: VS:  BP 100/60 (BP Location: Left Arm, Patient Position: Sitting, Cuff Size: Large)   Pulse 83   Ht 5\' 7"  (1.702 m)   Wt 247 lb (112 kg)   SpO2 98%   BMI 38.69 kg/m  , BMI Body mass index is 38.69 kg/m. GEN: Well nourished, well developed, in no acute distress  HEENT: normal  Neck: no JVD, carotid bruits, or masses Cardiac: RRR; no murmurs, rubs, or gallops, trace edema  Respiratory:  clear to auscultation bilaterally, normal work of breathing GI: soft, nontender, nondistended, + BS MS: no deformity or atrophy  Skin: warm and dry, no rash Neuro:  Strength and sensation are intact Psych: euthymic mood, full affect   EKG:  EKG is not ordered today.     Recent Labs: 03/25/2020: Hemoglobin  13.5; Platelets 157 07/15/2020: BUN 12; Creatinine, Ser 0.78; Potassium 3.9; Sodium 139    Lipid Panel No results found for: CHOL, TRIG, HDL, CHOLHDL, VLDL, LDLCALC, LDLDIRECT    Wt Readings from Last 3 Encounters:  10/27/20 247 lb (112 kg)  10/13/20 242 lb (109.8 kg)  09/22/20 245 lb 8 oz (111.4 kg)       ASSESSMENT AND PLAN:  1.   Chronic systolic heart failure: Most recent ejection fraction was 45 to 50%.  She is doing well at the present time and continues to be in [place] class II. Continue treatment with Toprol, losartan and spironolactone.  She is tolerating Jardiance with no issues.  2. Status post ICD placement: Followed by Dr. Caryl Comes  3.  NSVT: No recurrent palpitation.  Seems to be well controlled with beta-blocker therapy.   4.  Obstructive sleep apnea on CPAP.   Disposition:   FU with me in 6 months  Signed,  [name], MD  10/27/2020 3:45 PM    [facility]

## 2020-10-27 NOTE — Patient Instructions (Signed)

## 2020-11-02 DIAGNOSIS — M461 Sacroiliitis, not elsewhere classified: Secondary | ICD-10-CM | POA: Diagnosis not present

## 2020-11-04 DIAGNOSIS — Z23 Encounter for immunization: Secondary | ICD-10-CM | POA: Diagnosis not present

## 2020-11-07 ENCOUNTER — Ambulatory Visit (INDEPENDENT_AMBULATORY_CARE_PROVIDER_SITE_OTHER): Payer: Medicare HMO

## 2020-11-07 ENCOUNTER — Ambulatory Visit
Admission: EM | Admit: 2020-11-07 | Discharge: 2020-11-07 | Disposition: A | Discharge status: Home / Self Care | Payer: Medicare HMO | Attending: Emergency Medicine | Admitting: Emergency Medicine

## 2020-11-07 ENCOUNTER — Encounter: Payer: Self-pay | Admitting: Emergency Medicine

## 2020-11-07 ENCOUNTER — Other Ambulatory Visit: Payer: Self-pay

## 2020-11-07 DIAGNOSIS — W19XXXA Unspecified fall, initial encounter: Secondary | ICD-10-CM | POA: Diagnosis not present

## 2020-11-07 DIAGNOSIS — R0781 Pleurodynia: Secondary | ICD-10-CM

## 2020-11-07 DIAGNOSIS — R52 Pain, unspecified: Secondary | ICD-10-CM

## 2020-11-07 NOTE — ED Triage Notes (Addendum)
Pt here after mechanical fall onto asphalt on Saturday night. Hx of mechanical falls. Pain to right torso, right arm and back, left knee, left hand. No LOC

## 2020-11-07 NOTE — Discharge Instructions (Signed)
You were seen today for musculoskeletal chest pain. The chest X-ray was negative for signs of an infection in the lungs themselves. Please follow the below instructions at home. If you start to experience any shortness of breath, fever, chills, or symptoms worsen, this warrants reevaluation same-day in clinic or in the ER.    Rest and protect the sore area. Stop, change, or take a break from any activity that may be causing your pain or soreness. Put ice or a cold pack on the sore area for 10 to 20 minutes at a time. Try to do this every 1 to 2 hours for the next 3 days (when you are awake) or until the swelling goes down. Put a thin cloth between the ice and your skin. After 2 or 3 days, apply a heating pad set on low or a warm cloth to the area that hurts. Some doctors suggest that you go back and forth between hot and cold. Do not wrap or tape your ribs for support. This may cause you to take smaller breaths, which could increase your risk of lung problems. Mentholated creams such as Bengay or Icy Hot may soothe sore muscles. Follow the instructions on the package. Gentle stretching and massage may help you get better faster. Stretch slowly to the point just before pain begins, and hold the stretch for at least 15 to 30 seconds. Do this 3 or 4 times a day. Stretch just after you have applied heat. As your pain gets better, slowly return to your normal activities. Any increased pain may be a sign that you need to rest a while longer.   If your condition suddenly worsens, go to the nearest hospital Emergency Department.

## 2020-11-07 NOTE — ED Provider Notes (Signed)
Chief Complaint   Chief Complaint  Patient presents with   Fall     Subjective, HPI  Lisa Fuentes is a 60 y.o. female who presents with fall on asphalt Saturday night.  Patient reports a history of falls.  Patient reports pain to the right side of her torso, right arm, back, left knee and left hand.  No LOC or head injury reported.  History obtained from patient.  Patient's problem list, past medical and social history, medications, and allergies were reviewed by me and updated in Epic.   ROS  See HPI.  Objective   Vitals:   11/07/20 1146  BP: 138/80  Pulse: 76  Resp: 20  Temp: 98.8 F (37.1 C)  SpO2: 96%    Vital signs and nursing note reviewed.   General: Appears well-developed and well-nourished. No acute distress.  Head: Normocephalic and atraumatic.   Neck: Normal range of motion, neck is supple.  Cardiovascular: Normal rate. Pulm/Chest: No respiratory distress.  Musculoskeletal: Chest: Moderate TTP noted to palpation beneath right breast and to sternal region.  Left foot: No TTP.  Mild swelling.  No laxity.  Left knee: Mild TTP diffusely over left knee without laxity to patella.  MCL discomfort to palpation.  5/5 strength, full sensation, 2+  pulses, < 2 sec cap refill. Neurological: Alert and oriented to person, place, and time.  Skin: Skin is warm and dry.   Psychiatric: Normal mood, affect, behavior, and thought content.    Data  No results found for any visits on 11/07/20.   Imaging Right chest: On my read, no fracture or dislocation, no pneumothorax or pneumonia. Pending final interpretation.   Assessment & Plan  1. Rib pain on right side - DG Ribs Unilateral W/Chest Right; Standing - DG Ribs Unilateral W/Chest Right  2. Fall, initial encounter  3. Generalized body aches  60 y.o. female presents with fall on asphalt Saturday night.  Patient reports a history of falls.  Patient reports pain to the right side of her torso, right arm, back, left  knee and left hand.  No LOC or head injury reported.  Right chest and ribs reveals no fracture or dislocation along with no pneumothorax or pneumonia, pending final interpretation.  Likely generalized pain secondary to fall.  No concern for additional areas of fracture.  Advised about home treatment and care to include rest, ice and heat.  Also advised of mentholated creams and gentle stretches as tolerated.  Advised that if her condition suddenly worsen she needs to go to the emergency department for further investigation.  Patient verbalized understanding and agreed with plan.  Patient stable upon discharge.  Plan:   Discharge Instructions      You were seen today for musculoskeletal chest pain. The chest X-ray was negative for signs of an infection in the lungs themselves. Please follow the below instructions at home. If you start to experience any shortness of breath, fever, chills, or symptoms worsen, this warrants reevaluation same-day in clinic or in the ER.    Rest and protect the sore area. Stop, change, or take a break from any activity that may be causing your pain or soreness. Put ice or a cold pack on the sore area for 10 to 20 minutes at a time. Try to do this every 1 to 2 hours for the next 3 days (when you are awake) or until the swelling goes down. Put a thin cloth between the ice and your skin. After 2 or 3  days, apply a heating pad set on low or a warm cloth to the area that hurts. Some doctors suggest that you go back and forth between hot and cold. Do not wrap or tape your ribs for support. This may cause you to take smaller breaths, which could increase your risk of lung problems. Mentholated creams such as Bengay or Icy Hot may soothe sore muscles. Follow the instructions on the package. Gentle stretching and massage may help you get better faster. Stretch slowly to the point just before pain begins, and hold the stretch for at least 15 to 30 seconds. Do this 3 or 4 times a  day. Stretch just after you have applied heat. As your pain gets better, slowly return to your normal activities. Any increased pain may be a sign that you need to rest a while longer.   If your condition suddenly worsens, go to the nearest hospital Emergency Department.          Serafina Royals, [place] 11/07/20 1243

## 2020-11-16 ENCOUNTER — Encounter: Payer: Self-pay | Admitting: Obstetrics and Gynecology

## 2020-11-16 ENCOUNTER — Other Ambulatory Visit: Payer: Self-pay

## 2020-11-16 ENCOUNTER — Ambulatory Visit: Payer: Medicare HMO | Admitting: Obstetrics and Gynecology

## 2020-11-16 VITALS — BP 120/86 | HR 78 | Ht 67.0 in | Wt 243.2 lb

## 2020-11-16 DIAGNOSIS — G894 Chronic pain syndrome: Secondary | ICD-10-CM | POA: Diagnosis not present

## 2020-11-16 DIAGNOSIS — R102 Pelvic and perineal pain: Secondary | ICD-10-CM

## 2020-11-16 DIAGNOSIS — Z79899 Other long term (current) drug therapy: Secondary | ICD-10-CM | POA: Diagnosis not present

## 2020-11-16 DIAGNOSIS — M545 Low back pain, unspecified: Secondary | ICD-10-CM | POA: Diagnosis not present

## 2020-11-16 DIAGNOSIS — M62830 Muscle spasm of back: Secondary | ICD-10-CM | POA: Diagnosis not present

## 2020-11-16 DIAGNOSIS — N993 Prolapse of vaginal vault after hysterectomy: Secondary | ICD-10-CM

## 2020-11-16 DIAGNOSIS — R202 Paresthesia of skin: Secondary | ICD-10-CM | POA: Diagnosis not present

## 2020-11-16 DIAGNOSIS — M47817 Spondylosis without myelopathy or radiculopathy, lumbosacral region: Secondary | ICD-10-CM | POA: Diagnosis not present

## 2020-11-16 DIAGNOSIS — Z4689 Encounter for fitting and adjustment of other specified devices: Secondary | ICD-10-CM

## 2020-11-16 DIAGNOSIS — M533 Sacrococcygeal disorders, not elsewhere classified: Secondary | ICD-10-CM | POA: Diagnosis not present

## 2020-11-16 NOTE — Progress Notes (Signed)
    GYNECOLOGY PROGRESS NOTE  Subjective:    Patient ID: Lisa Fuentes, female    DOB: 08/04/1960, 60 y.o.   MRN: [id number]  HPI  Patient is a 60 y.o. [id number] female who presents for pessary fitting. Has small apical vault prolapse (Grade 1-2) and pelvic pressure/pain. Denies urinary urgency or incontinence.   The following portions of the patient's history were reviewed and updated as appropriate: allergies, current medications, past family history, past medical history, past social history, past surgical history, and problem list.  Review of Systems Pertinent items noted in HPI and remainder of comprehensive ROS otherwise negative.   Objective:   Blood pressure 120/86, pulse 78, height $RemoveBe'5\' 7"'[id number]$  (1.702 m), weight 243 lb 3.2 oz (110.3 kg). Body mass index is 38.09 kg/m. General appearance: alert, cooperative, appears stated age, and no distress Abdomen: soft, non-tender; bowel sounds normal; no masses,  no organomegaly Pelvic: external genitalia normal, rectovaginal septum normal.  Vagina without discharge.  Grade 1-2 apical vaginal vault prolapse.  Adnexae surgically absent.  Extremities: extremities normal, atraumatic, no cyanosis or edema Neurologic: Grossly normal   Assessment:   1. Encounter for fitting and adjustment of pessary   2. Pelvic pain   3. Vaginal vault prolapse, posthysterectomy      Plan:   1. Encounter for fitting and adjustment of pessary Pessary sizing performed today, unable to tolerate Size 2 ring with support (smallest in sizing kit). Will likely need a Size 0. Will order. To return once pessary arrives for insertion.     Rubie Maid, MD [facility]

## 2020-11-22 ENCOUNTER — Ambulatory Visit (INDEPENDENT_AMBULATORY_CARE_PROVIDER_SITE_OTHER): Payer: Medicare HMO

## 2020-11-22 DIAGNOSIS — I5022 Chronic systolic (congestive) heart failure: Secondary | ICD-10-CM

## 2020-11-22 LAB — CUP PACEART REMOTE DEVICE CHECK
Battery Remaining Longevity: 45 mo
Battery Voltage: 2.98 V
Brady Statistic RV Percent Paced: 0.01 %
Date Time Interrogation Session: 20221025012205
HighPow Impedance: 56 Ohm
HighPow Impedance: 67 Ohm
Implantable Lead Implant Date: 20150508
Implantable Lead Location: 753860
Implantable Lead Model: 185
Implantable Lead Serial Number: 194163
Implantable Pulse Generator Implant Date: 20150508
Lead Channel Impedance Value: 513 Ohm
Lead Channel Impedance Value: 513 Ohm
Lead Channel Pacing Threshold Amplitude: 0.625 V
Lead Channel Pacing Threshold Pulse Width: 0.4 ms
Lead Channel Sensing Intrinsic Amplitude: 10 mV
Lead Channel Sensing Intrinsic Amplitude: 10 mV
Lead Channel Setting Pacing Amplitude: 2 V
Lead Channel Setting Pacing Pulse Width: 0.4 ms
Lead Channel Setting Sensing Sensitivity: 0.3 mV

## 2020-11-30 NOTE — Progress Notes (Signed)
Remote ICD transmission.   

## 2021-01-04 DIAGNOSIS — Z0000 Encounter for general adult medical examination without abnormal findings: Secondary | ICD-10-CM | POA: Diagnosis not present

## 2021-01-04 DIAGNOSIS — E782 Mixed hyperlipidemia: Secondary | ICD-10-CM | POA: Diagnosis not present

## 2021-01-04 DIAGNOSIS — Z23 Encounter for immunization: Secondary | ICD-10-CM | POA: Diagnosis not present

## 2021-01-04 DIAGNOSIS — E890 Postprocedural hypothyroidism: Secondary | ICD-10-CM | POA: Diagnosis not present

## 2021-01-04 DIAGNOSIS — K219 Gastro-esophageal reflux disease without esophagitis: Secondary | ICD-10-CM | POA: Diagnosis not present

## 2021-01-04 DIAGNOSIS — I110 Hypertensive heart disease with heart failure: Secondary | ICD-10-CM | POA: Diagnosis not present

## 2021-01-04 DIAGNOSIS — I5022 Chronic systolic (congestive) heart failure: Secondary | ICD-10-CM | POA: Diagnosis not present

## 2021-01-09 ENCOUNTER — Telehealth: Payer: Self-pay | Admitting: Obstetrics and Gynecology

## 2021-01-09 NOTE — Telephone Encounter (Signed)
Pt is calling in stating that she would like to move forward with her surgery for prolaspe pelvis due to being in a lot of pain this weekend and would like to have it done before Christmas.  Pt also stated that since the device that she was suppose to get has not come in.  Pt would like to have a call back with the surgery date.

## 2021-01-09 NOTE — Telephone Encounter (Signed)
We can get her on the schedule to talk but surgery will not before Christmas as the OR schedule is booked for the rest of the month.

## 2021-01-09 NOTE — Telephone Encounter (Signed)
Patient called.  Patient aware.  

## 2021-01-11 DIAGNOSIS — I10 Essential (primary) hypertension: Secondary | ICD-10-CM | POA: Diagnosis not present

## 2021-01-11 DIAGNOSIS — E782 Mixed hyperlipidemia: Secondary | ICD-10-CM | POA: Diagnosis not present

## 2021-01-11 DIAGNOSIS — E039 Hypothyroidism, unspecified: Secondary | ICD-10-CM | POA: Diagnosis not present

## 2021-01-24 ENCOUNTER — Ambulatory Visit
Admission: EM | Admit: 2021-01-24 | Discharge: 2021-01-24 | Disposition: A | Discharge status: Home / Self Care | Payer: Medicare HMO

## 2021-01-24 ENCOUNTER — Other Ambulatory Visit: Payer: Self-pay

## 2021-01-24 ENCOUNTER — Encounter: Payer: Self-pay | Admitting: Emergency Medicine

## 2021-01-24 DIAGNOSIS — H6982 Other specified disorders of Eustachian tube, left ear: Secondary | ICD-10-CM | POA: Diagnosis not present

## 2021-01-24 DIAGNOSIS — H1030 Unspecified acute conjunctivitis, unspecified eye: Secondary | ICD-10-CM | POA: Diagnosis not present

## 2021-01-24 DIAGNOSIS — H9202 Otalgia, left ear: Secondary | ICD-10-CM | POA: Diagnosis not present

## 2021-01-24 NOTE — Discharge Instructions (Addendum)
Take plain Mucinex.  Use Flonase nasal spray.  Apply warm compresses as directed.  Follow up with your primary care provider or ENT if your symptoms are not improving.

## 2021-01-24 NOTE — ED Provider Notes (Signed)
Lisa Fuentes    CSN: [id number] Arrival date & time: 01/24/21  1606      History   Chief Complaint Chief Complaint  Patient presents with   Otalgia    HPI ERION HERMANS is a 60 y.o. female.  Patient presents with 4-day history of left ear pain.  She also reports intermittent sinusitis and is followed by ENT.  She denies fever, chills, rash, sore throat, cough, shortness of breath, or other symptoms.  She was taking Mucinex DM for her sinus congestion about a month ago; her sinus symptoms improved.  Her medical history includes hypertension, heart failure, cardiomyopathy, chronic pain syndrome, morbid obesity.  The history is provided by the patient and medical records.   Past Medical History:  Diagnosis Date   Anemia    Anxiety    panic attacks   Arthritis    Clotting disorder ([place])    Depression    Difficult intubation    Endometriosis of vagina 09/2013   Intra-operative findings of endometriosis implants on cervical stump   GERD (gastroesophageal reflux disease)    Headache    Hypercholesteremia    Hypertension    Hypokalemia    Hypothyroidism    Implantable defibrillator    Medtronic DOI 2008, replacement 2015   Menopausal symptoms    Nonischemic cardiomyopathy (HCC)    Interval improvement EF 40-45% 1/16   Shortness of breath dyspnea    chronic doe   Sleep apnea    Treated with CPAP   Tachycardia     Patient Active Problem List   Diagnosis Date Noted   Abnormal cardiovascular stress test    Anxiety 05/27/2019   Chronic pain syndrome 05/27/2019   Depression 05/27/2019   HFrEF (heart failure with reduced ejection fraction) ([place]) 05/27/2019   Morbid obesity ([place]) 03/03/2019   Tibialis posterior tendinitis 08/22/2017   Chest pain of uncertain etiology 29/93/7169   Mixed hyperlipidemia 09/10/2016   Low back pain 03/22/2016   Family history of malignant neoplasm of gastrointestinal tract    Dizziness 03/27/2015   Postoperative state 11/15/2014    LV dysfunction 10/29/2014   Knee pain 10/22/2014   Endometriosis 09/18/2014   ICD (implantable cardioverter-defibrillator) in place 67/89/3810   Chronic systolic heart failure ([place])    Essential hypertension    Hypercholesteremia    Sleep apnea 05/12/2013   Hyperthyroidism 05/08/2013   Dilated cardiomyopathy ([place]) 05/08/2013    Past Surgical History:  Procedure Laterality Date   ANTERIOR AND POSTERIOR REPAIR N/A 11/25/2017   Procedure: ANTERIOR (CYSTOCELE) AND POSTERIOR REPAIR (RECTOCELE);  Surgeon: Rubie Maid, MD;  Location: [facility] ORS;  Service: Gynecology;  Laterality: N/A;   BREAST CYST ASPIRATION Left 03/22/2014   neg/ done by Dr Jamal Collin   CARDIAC CATHETERIZATION  2003   [place]: No significant coronary artery disease with reduced ejection fraction.   CARDIAC DEFIBRILLATOR PLACEMENT     CARPAL TUNNEL RELEASE Right    COLONOSCOPY  2015   COLONOSCOPY N/A 10/13/2020   Procedure: COLONOSCOPY;  Surgeon: Annamaria Helling, DO;  Location: [facility] ENDOSCOPY;  Service: Gastroenterology;  Laterality: N/A;   COLONOSCOPY WITH PROPOFOL N/A 10/04/2015   Procedure: COLONOSCOPY WITH PROPOFOL;  Surgeon: Lucilla Lame, MD;  Location: [facility] ENDOSCOPY;  Service: Endoscopy;  Laterality: N/A;   CORONARY ANGIOPLASTY     CORONARY ARTERY BYPASS GRAFT     CYSTOSCOPY  11/15/2014   Procedure: CYSTOSCOPY;  Surgeon: Rubie Maid, MD;  Location: [facility] ORS;  Service: Gynecology;;   DIAGNOSTIC LAPAROSCOPY  FOOT SURGERY     HAND SURGERY     KNEE ARTHROSCOPY Left 03/02/2015   Procedure: ARTHROSCOPY  LEFT KNEE, PARTIAL LATERAL  MENISECTOMY, SYNOVECTOMY, MEDIAL & LATERAL CHONDROPLASTY;  Surgeon: Thornton Park, MD;  Location: [facility] ORS;  Service: Orthopedics;  Laterality: Left;  Dr. Mack Guise wanted her surgery switched from [facility] because of the Defibrillator.  patient has a Defibrillator  takes Asprin/knows to stop 5days before  has had some test done   LAPAROSCOPIC SALPINGO OOPHERECTOMY Left  11/15/2014   Procedure: LAPAROSCOPIC OOPHORECTOMY;  Surgeon: Rubie Maid, MD;  Location: [facility] ORS;  Service: Gynecology;  Laterality: Left;   LAPAROSCOPIC SALPINGOOPHERECTOMY Right 09/2013   also with left salpingectomy   LEFT HEART CATH AND CORONARY ANGIOGRAPHY N/A 03/28/2020   Procedure: LEFT HEART CATH AND CORONARY ANGIOGRAPHY;  Surgeon: Wellington Hampshire, MD;  Location: [facility];  Service: Cardiovascular;  Laterality: N/A;   TONSILLECTOMY     TOTAL ABDOMINAL HYSTERECTOMY     TRACHELECTOMY N/A 11/15/2014   Procedure: TRACHELECTOMY;  Surgeon: Rubie Maid, MD;  Location: [facility] ORS;  Service: Gynecology;  Laterality: N/A;   TUBAL LIGATION Bilateral     OB History     Gravida  3   Para  3   Term  3   Preterm      AB      Living  3      SAB      IAB      Ectopic      Multiple      Live Births  3        Obstetric Comments  1st Menstrual Cycle:  12 1st Pregnancy:  21           Home Medications    Prior to Admission medications   Medication Sig Start Date End Date Taking? Authorizing Provider  aspirin EC 81 MG tablet Take 1 tablet (81 mg total) by mouth daily. 05/22/16   Deboraha Sprang, MD  cetirizine (ZYRTEC) 10 MG tablet Take 10 mg by mouth daily.    [provider]  cyclobenzaprine (FLEXERIL) 10 MG tablet Take 10 mg by mouth 3 (three) times daily as needed for muscle spasms.    [provider]  empagliflozin (JARDIANCE) 10 MG TABS tablet Take 1 tablet (10 mg total) by mouth daily before breakfast. 07/26/20   Wellington Hampshire, MD  esomeprazole (NEXIUM) 40 MG capsule Take 40 mg by mouth every morning.     [provider]  EUTHYROX 137 MCG tablet Take 137 mcg by mouth every morning. 02/11/20   [provider]  fluticasone (FLONASE) 50 MCG/ACT nasal spray Place 1 spray into both nostrils daily.  09/25/19   [provider]  furosemide (LASIX) 20 MG tablet Take 1 tablet (20 mg total) by mouth daily. *PATIENT  OCCASIONALLY TAKES AN EXTRA TABLET* 10/27/20   Wellington Hampshire, MD  losartan (COZAAR) 25 MG tablet Take 0.5 tablets (12.5 mg total) by mouth daily. 04/11/20   Dunn, Areta Haber, PA-C  metoprolol succinate (TOPROL XL) 50 MG 24 hr tablet Take 1 tablet (50 mg total) by mouth daily. Take with or immediately following a meal. 07/26/20   Wellington Hampshire, MD  nystatin (MYCOSTATIN) powder Apply 1 Bottle topically 4 (four) times daily as needed (irritaton). Reported on 08/18/2015    [provider]  nystatin cream (MYCOSTATIN) Apply topically 2 (two) times daily. 03/19/20   [provider]  oxyCODONE-acetaminophen (PERCOCET/ROXICET) 5-325 MG tablet  Take by mouth every 4 (four) hours as needed for severe pain.    [provider]  sertraline (ZOLOFT) 100 MG tablet Take 100 mg by mouth daily. 03/04/20   [provider]  spironolactone (ALDACTONE) 25 MG tablet Take 1/2 (one-half) tablet by mouth once daily 09/27/20   Wellington Hampshire, MD    Family History Family History  Problem Relation Age of Onset   Hypertension Mother    Hyperlipidemia Mother    Stroke Mother    Colon cancer Mother    Breast cancer Sister 2   Ovarian cancer Sister    Throat cancer Brother     Social History Social History   Tobacco Use   Smoking status: Never   Smokeless tobacco: Never  Vaping Use   Vaping Use: Never used  Substance Use Topics   Alcohol use: Yes    Alcohol/week: 0.0 standard drinks    Comment: occassionally   Drug use: No     Allergies   Sulfa antibiotics and Sulfamethoxazole-trimethoprim   Review of Systems Review of Systems  Constitutional:  Negative for chills and fever.  HENT:  Positive for ear pain. Negative for congestion and sore throat.   Respiratory:  Negative for cough and shortness of breath.   Cardiovascular:  Negative for chest pain and palpitations.  Skin:  Negative for color change and rash.  All other systems reviewed and are negative.   Physical  Exam Triage Vital Signs ED Triage Vitals  Enc Vitals Group     BP      Pulse      Resp      Temp      Temp src      SpO2      Weight      Height      Head Circumference      Peak Flow      Pain Score      Pain Loc      Pain Edu?      Excl. in [place]?    No data found.  Updated Vital Signs BP 128/85    Pulse 64    Temp 98.7 F (37.1 C) (Oral)    Resp 18    SpO2 98%   Visual Acuity Right Eye Distance:   Left Eye Distance:   Bilateral Distance:    Right Eye Near:   Left Eye Near:    Bilateral Near:     Physical Exam Vitals and nursing note reviewed.  Constitutional:      General: She is not in acute distress.    Appearance: She is well-developed. She is not ill-appearing.  HENT:     Right Ear: Tympanic membrane and ear canal normal.     Left Ear: Ear canal normal. A middle ear effusion is present.     Nose: Nose normal.     Mouth/Throat:     Mouth: Mucous membranes are moist.     Pharynx: Oropharynx is clear.  Cardiovascular:     Rate and Rhythm: Normal rate and regular rhythm.     Heart sounds: Normal heart sounds.  Pulmonary:     Effort: Pulmonary effort is normal. No respiratory distress.     Breath sounds: Normal breath sounds.  Musculoskeletal:     Cervical back: Neck supple.  Skin:    General: Skin is warm and dry.  Neurological:     Mental Status: She is alert.  Psychiatric:        Mood and  Affect: Mood normal.        Behavior: Behavior normal.     UC Treatments / Results  Labs (all labs ordered are listed, but only abnormal results are displayed) Labs Reviewed - No data to display  EKG   Radiology No results found.  Procedures Procedures (including critical care time)  Medications Ordered in UC Medications - No data to display  Initial Impression / Assessment and Plan / UC Course  I have reviewed the triage vital signs and the nursing notes.  Pertinent labs & imaging results that were available during my care of the patient were  reviewed by me and considered in my medical decision making (see chart for details).   Left otalgia and eustachian tube dysfunction.  Treating with plain Mucinex, Flonase nasal spray, warm compresses.  Education provided on eustachian tube dysfunction and earaches.  Instructed patient to follow-up with her PCP or ENT if her symptoms are not improving.  She agrees to plan of care.   Final Clinical Impressions(s) / UC Diagnoses   Final diagnoses:  Acute otalgia, left  Dysfunction of Eustachian tube, left     Discharge Instructions      Take plain Mucinex.  Use Flonase nasal spray.  Apply warm compresses as directed.  Follow up with your primary care provider or ENT if your symptoms are not improving.        ED Prescriptions   None    PDMP not reviewed this encounter.   Sharion Balloon, NP 01/24/21 [phone]

## 2021-01-24 NOTE — ED Triage Notes (Signed)
Pt here with left ear pain x 4 days.

## 2021-02-01 ENCOUNTER — Ambulatory Visit: Payer: Medicare HMO | Admitting: Obstetrics and Gynecology

## 2021-02-01 ENCOUNTER — Encounter: Payer: Self-pay | Admitting: Obstetrics and Gynecology

## 2021-02-01 ENCOUNTER — Other Ambulatory Visit: Payer: Self-pay

## 2021-02-01 VITALS — BP 106/70 | HR 74 | Ht 67.0 in | Wt 244.0 lb

## 2021-02-01 DIAGNOSIS — N993 Prolapse of vaginal vault after hysterectomy: Secondary | ICD-10-CM | POA: Diagnosis not present

## 2021-02-01 NOTE — Progress Notes (Signed)
° ° °  GYNECOLOGY PROGRESS NOTE  Subjective:    Patient ID: Lisa Fuentes, female    DOB: [birth year], 61 y.o.   MRN: [id number]  HPI  Patient is a 61 y.o. [id number] female who presents for discussion of surgical intervention for her pelvic pressure symptoms and apical vaginal prolapse.  She has prior history of hysterectomy. Currently with Grade 1-2 apical vaginal prolapse. Was initially planning to utilize a pessary for symptoms, however due to delay in shipping (product was on back order), notes that she would prefer to proceed with surgical option (has been waiting since October).   She reports that she has been prescribed oxycodone for pain management due to her chronic back pain and DDD.  Still has some difficulties with activities such as getting in and out of her car (has an SUV).   The following portions of the patient's history were reviewed and updated as appropriate: allergies, current medications, past family history, past medical history, past social history, past surgical history, and problem list.  Review of Systems Pertinent items are noted in HPI.   Objective:   Blood pressure 106/70, pulse 74, height 5\' 7"  (1.702 m), weight 244 lb (110.7 kg), SpO2 98 %. Body mass index is 38.22 kg/m. General appearance: alert and no distress Exam deferred.  See previous exam from 11/16/2020.    Assessment:   1. Vaginal vault prolapse, posthysterectomy     Plan:   Patient initially with low back pain thought it may have been due to her prolapse however reassured that this was not likely the case as her prolapse was mild.  Discussed possible options of surgical management, A/P repair versus vaginal vault suspension, as patient no longer desires to utilize a pessary.  Advised that I would recommend that she see a urogynecologist for further evaluation before surgical intervention is undertaken.  Will place referral.   A total of 15 minutes were spent face-to-face with the patient during  this encounter and over half of that time dealt with counseling and coordination of care.   Rubie Maid, MD [facility]

## 2021-02-07 DIAGNOSIS — R051 Acute cough: Secondary | ICD-10-CM | POA: Diagnosis not present

## 2021-02-07 DIAGNOSIS — Z03818 Encounter for observation for suspected exposure to other biological agents ruled out: Secondary | ICD-10-CM | POA: Diagnosis not present

## 2021-02-14 DIAGNOSIS — J0181 Other acute recurrent sinusitis: Secondary | ICD-10-CM | POA: Diagnosis not present

## 2021-02-14 DIAGNOSIS — H6983 Other specified disorders of Eustachian tube, bilateral: Secondary | ICD-10-CM | POA: Diagnosis not present

## 2021-02-15 DIAGNOSIS — Z5181 Encounter for therapeutic drug level monitoring: Secondary | ICD-10-CM | POA: Diagnosis not present

## 2021-02-15 DIAGNOSIS — S63602A Unspecified sprain of left thumb, initial encounter: Secondary | ICD-10-CM | POA: Diagnosis not present

## 2021-02-15 DIAGNOSIS — R202 Paresthesia of skin: Secondary | ICD-10-CM | POA: Diagnosis not present

## 2021-02-15 DIAGNOSIS — M47817 Spondylosis without myelopathy or radiculopathy, lumbosacral region: Secondary | ICD-10-CM | POA: Diagnosis not present

## 2021-02-15 DIAGNOSIS — M533 Sacrococcygeal disorders, not elsewhere classified: Secondary | ICD-10-CM | POA: Diagnosis not present

## 2021-02-15 DIAGNOSIS — M545 Low back pain, unspecified: Secondary | ICD-10-CM | POA: Diagnosis not present

## 2021-02-15 DIAGNOSIS — M1712 Unilateral primary osteoarthritis, left knee: Secondary | ICD-10-CM | POA: Diagnosis not present

## 2021-02-15 DIAGNOSIS — M62830 Muscle spasm of back: Secondary | ICD-10-CM | POA: Diagnosis not present

## 2021-02-15 DIAGNOSIS — Z79899 Other long term (current) drug therapy: Secondary | ICD-10-CM | POA: Diagnosis not present

## 2021-02-15 DIAGNOSIS — G894 Chronic pain syndrome: Secondary | ICD-10-CM | POA: Diagnosis not present

## 2021-02-15 DIAGNOSIS — Z79891 Long term (current) use of opiate analgesic: Secondary | ICD-10-CM | POA: Diagnosis not present

## 2021-02-21 ENCOUNTER — Ambulatory Visit (INDEPENDENT_AMBULATORY_CARE_PROVIDER_SITE_OTHER): Payer: Medicare HMO

## 2021-02-21 DIAGNOSIS — I5022 Chronic systolic (congestive) heart failure: Secondary | ICD-10-CM | POA: Diagnosis not present

## 2021-02-21 LAB — CUP PACEART REMOTE DEVICE CHECK
Battery Remaining Longevity: 43 mo
Battery Voltage: 2.98 V
Brady Statistic RV Percent Paced: 0.01 %
Date Time Interrogation Session: 20230124043824
HighPow Impedance: 50 Ohm
HighPow Impedance: 65 Ohm
Implantable Lead Implant Date: 20150508
Implantable Lead Location: 753860
Implantable Lead Model: 185
Implantable Lead Serial Number: 194163
Implantable Pulse Generator Implant Date: 20150508
Lead Channel Impedance Value: 513 Ohm
Lead Channel Impedance Value: 513 Ohm
Lead Channel Pacing Threshold Amplitude: 0.875 V
Lead Channel Pacing Threshold Pulse Width: 0.4 ms
Lead Channel Sensing Intrinsic Amplitude: 9.5 mV
Lead Channel Sensing Intrinsic Amplitude: 9.5 mV
Lead Channel Setting Pacing Amplitude: 2 V
Lead Channel Setting Pacing Pulse Width: 0.4 ms
Lead Channel Setting Sensing Sensitivity: 0.3 mV

## 2021-03-03 NOTE — Progress Notes (Signed)
Remote ICD transmission.   

## 2021-03-15 DIAGNOSIS — M545 Low back pain, unspecified: Secondary | ICD-10-CM | POA: Diagnosis not present

## 2021-03-15 DIAGNOSIS — Z79899 Other long term (current) drug therapy: Secondary | ICD-10-CM | POA: Diagnosis not present

## 2021-03-15 DIAGNOSIS — R202 Paresthesia of skin: Secondary | ICD-10-CM | POA: Diagnosis not present

## 2021-03-15 DIAGNOSIS — M47817 Spondylosis without myelopathy or radiculopathy, lumbosacral region: Secondary | ICD-10-CM | POA: Diagnosis not present

## 2021-03-15 DIAGNOSIS — G894 Chronic pain syndrome: Secondary | ICD-10-CM | POA: Diagnosis not present

## 2021-03-15 DIAGNOSIS — M62830 Muscle spasm of back: Secondary | ICD-10-CM | POA: Diagnosis not present

## 2021-03-15 DIAGNOSIS — M533 Sacrococcygeal disorders, not elsewhere classified: Secondary | ICD-10-CM | POA: Diagnosis not present

## 2021-03-18 ENCOUNTER — Other Ambulatory Visit: Payer: Self-pay | Admitting: Cardiovascular Disease

## 2021-03-18 DIAGNOSIS — I5022 Chronic systolic (congestive) heart failure: Secondary | ICD-10-CM

## 2021-03-22 ENCOUNTER — Ambulatory Visit: Payer: Medicare HMO | Admitting: Obstetrics and Gynecology

## 2021-03-22 ENCOUNTER — Encounter: Payer: Self-pay | Admitting: Obstetrics and Gynecology

## 2021-03-22 ENCOUNTER — Other Ambulatory Visit: Payer: Self-pay

## 2021-03-22 VITALS — BP 118/76 | HR 71 | Wt 244.0 lb

## 2021-03-22 DIAGNOSIS — M62838 Other muscle spasm: Secondary | ICD-10-CM

## 2021-03-22 DIAGNOSIS — N393 Stress incontinence (female) (male): Secondary | ICD-10-CM

## 2021-03-22 DIAGNOSIS — R350 Frequency of micturition: Secondary | ICD-10-CM | POA: Diagnosis not present

## 2021-03-22 LAB — POCT URINALYSIS DIPSTICK
Appearance: NORMAL
Bilirubin, UA: NEGATIVE
Glucose, UA: POSITIVE — AB
Ketones, UA: NEGATIVE
Leukocytes, UA: NEGATIVE
Nitrite, UA: NEGATIVE
Protein, UA: NEGATIVE
Spec Grav, UA: 1.025 (ref 1.010–1.025)
Urobilinogen, UA: 0.2 E.U./dL
pH, UA: 5.5 (ref 5.0–8.0)

## 2021-03-22 MED ORDER — DIAZEPAM 5 MG PO TABS: ORAL_TABLET | ORAL | 0 refills | Status: DC

## 2021-03-22 NOTE — Progress Notes (Signed)
[facility] New Patient Evaluation and Consultation  Referring Provider: Rubie Maid, MD PCP: [place] Pink, MD Date of Service: 03/22/2021  SUBJECTIVE Chief Complaint: New Patient (Initial Visit)  History of Present Illness: Lisa Fuentes is a 61 y.o. Black or African-American female seen in consultation at the request of Dr. Marcelline Mates for evaluation of prolapse.    Review of records from Dr Marcelline Mates significant for: Has had an anterior and posterior repair, now with recurrent prolapse.   Has history of cardiomyopathy and chronic systolic HF. EF 45-50%  Urinary Symptoms: Leaks urine with cough/ sneeze and lifting Leaks a few times a week Pad use:  liners/ mini-pads per day.   She is bothered by her UI symptoms.  Day time voids 4-5.  Nocturia: 2 times per night to void. Voiding dysfunction: she empties her bladder well.  does not use a catheter to empty bladder.  When urinating, she feels dribbling after finishing  UTIs:  0  UTI's in the last year.   Denies history of blood in urine and kidney or bladder stones  Pelvic Organ Prolapse Symptoms:                  She Denies a feeling of a bulge the vaginal area.  Bowel Symptom: Bowel movements: 2-3 time(s) per week Stool consistency: hard Straining: yes.  Splinting: yes.  Incomplete evacuation: yes.  She Denies accidental bowel leakage / fecal incontinence Bowel regimen: none Last colonoscopy: Date 2022- 6 polyps removed  Sexual Function Sexually active: soemtimes Sexual orientation: Straight Pain with sex: has discomfort due to prolapse, has discomfort due to dryness  Pelvic Pain Denies pelvic pain Location: on the top part of the pelvis Pain occurs: walking, getting up Prior pain treatment: none Improved by: nothing Worsened by: walking   Past Medical History:  Past Medical History:  Diagnosis Date   Anemia    Anxiety    panic attacks   Arthritis    Clotting disorder ([place])    Depression     Difficult intubation    Endometriosis of vagina 09/2013   Intra-operative findings of endometriosis implants on cervical stump   GERD (gastroesophageal reflux disease)    Headache    Hypercholesteremia    Hypertension    Hypokalemia    Hypothyroidism    Implantable defibrillator    Medtronic DOI 2008, replacement 2015   Menopausal symptoms    Nonischemic cardiomyopathy (HCC)    Interval improvement EF 40-45% 1/16   Shortness of breath dyspnea    chronic doe   Sleep apnea    Treated with CPAP   Tachycardia      Past Surgical History:   Past Surgical History:  Procedure Laterality Date   ANTERIOR AND POSTERIOR REPAIR N/A 11/25/2017   Procedure: ANTERIOR (CYSTOCELE) AND POSTERIOR REPAIR (RECTOCELE);  Surgeon: Rubie Maid, MD;  Location: [facility] ORS;  Service: Gynecology;  Laterality: N/A;   BREAST CYST ASPIRATION Left 03/22/2014   neg/ done by Dr Jamal Collin   CARDIAC CATHETERIZATION  2003   [place]: No significant coronary artery disease with reduced ejection fraction.   CARDIAC DEFIBRILLATOR PLACEMENT     CARPAL TUNNEL RELEASE Right    COLONOSCOPY  2015   COLONOSCOPY N/A 10/13/2020   Procedure: COLONOSCOPY;  Surgeon: Annamaria Helling, DO;  Location: [facility] ENDOSCOPY;  Service: Gastroenterology;  Laterality: N/A;   COLONOSCOPY WITH PROPOFOL N/A 10/04/2015   Procedure: COLONOSCOPY WITH PROPOFOL;  Surgeon: Lucilla Lame, MD;  Location: [facility] ENDOSCOPY;  Service: Endoscopy;  Laterality: N/A;   CORONARY ANGIOPLASTY     CORONARY ARTERY BYPASS GRAFT     CYSTOSCOPY  11/15/2014   Procedure: CYSTOSCOPY;  Surgeon: Rubie Maid, MD;  Location: [facility] ORS;  Service: Gynecology;;   DIAGNOSTIC LAPAROSCOPY     FOOT SURGERY     HAND SURGERY     KNEE ARTHROSCOPY Left 03/02/2015   Procedure: ARTHROSCOPY  LEFT KNEE, PARTIAL LATERAL  MENISECTOMY, SYNOVECTOMY, MEDIAL & LATERAL CHONDROPLASTY;  Surgeon: Thornton Park, MD;  Location: [facility] ORS;  Service: Orthopedics;  Laterality: Left;  Dr. Mack Guise  wanted her surgery switched from [facility] because of the Defibrillator.  patient has a Defibrillator  takes Asprin/knows to stop 5days before  has had some test done   LAPAROSCOPIC SALPINGO OOPHERECTOMY Left 11/15/2014   Procedure: LAPAROSCOPIC OOPHORECTOMY;  Surgeon: Rubie Maid, MD;  Location: [facility] ORS;  Service: Gynecology;  Laterality: Left;   LAPAROSCOPIC SALPINGOOPHERECTOMY Right 09/2013   also with left salpingectomy   LEFT HEART CATH AND CORONARY ANGIOGRAPHY N/A 03/28/2020   Procedure: LEFT HEART CATH AND CORONARY ANGIOGRAPHY;  Surgeon: Wellington Hampshire, MD;  Location: [facility];  Service: Cardiovascular;  Laterality: N/A;   TONSILLECTOMY     TOTAL ABDOMINAL HYSTERECTOMY     TRACHELECTOMY N/A 11/15/2014   Procedure: TRACHELECTOMY;  Surgeon: Rubie Maid, MD;  Location: [facility] ORS;  Service: Gynecology;  Laterality: N/A;   TUBAL LIGATION Bilateral      Past OB/GYN History: OB History  Gravida Para Term Preterm AB Living  3 3 3     2   SAB IAB Ectopic Multiple Live Births          3    # Outcome Date GA Lbr Len/2nd Weight Sex Delivery Anes PTL Lv  3 Term 03/20/89    M Vag-Spont   LIV  2 Term 49    F Vag-Spont   LIV  1 Term 11/14/82    F Vag-Spont   LIV    Obstetric Comments  1st Menstrual Cycle:  12  1st Pregnancy:  21   S/p hysterectomy   Medications: She has a current medication list which includes the following prescription(s): aspirin ec, cetirizine, cyclobenzaprine, diazepam, esomeprazole, euthyrox, fluticasone, furosemide, jardiance, losartan, metoprolol succinate, nystatin, nystatin cream, oxycodone-acetaminophen, sertraline, and spironolactone.   Allergies: Patient is allergic to sulfa antibiotics and sulfamethoxazole-trimethoprim.   Social History:  Social History   Tobacco Use   Smoking status: Never   Smokeless tobacco: Never  Vaping Use   Vaping Use: Never used  Substance Use Topics   Alcohol use: Yes    Alcohol/week: 0.0  standard drinks    Comment: occassionally   Drug use: No    Relationship status: married She lives with husband.   She is employed- at a daycare. Regular exercise: No- limited due to back pain, takes oxycodone History of abuse: No  Family History:   Family History  Problem Relation Age of Onset   Hypertension Mother    Hyperlipidemia Mother    Stroke Mother    Colon cancer Mother    Breast cancer Sister 31   Ovarian cancer Sister    Throat cancer Brother      Review of Systems: ROS   OBJECTIVE Physical Exam: Vitals:   03/22/21 1541  BP: 118/76  Pulse: 71  Weight: 244 lb (110.7 kg)    Physical Exam Constitutional:      General: She is not in acute distress. Pulmonary:     Effort: Pulmonary effort is  normal.  Abdominal:     General: There is no distension.     Palpations: Abdomen is soft.     Tenderness: There is no abdominal tenderness. There is no rebound.  Musculoskeletal:        General: No swelling. Normal range of motion.  Skin:    General: Skin is warm and dry.     Findings: No rash.  Neurological:     Mental Status: She is alert and oriented to person, place, and time.  Psychiatric:        Mood and Affect: Mood normal.        Behavior: Behavior normal.     GU / Detailed Urogynecologic Evaluation:  Pelvic Exam: Normal external female genitalia; Bartholin's and Skene's glands normal in appearance; urethral meatus normal in appearance, no urethral masses or discharge.   CST: negative  s/p hysterectomy: Speculum exam reveals normal vaginal mucosa with  atrophy and normal vaginal cuff.  Adnexa no mass, fullness, tenderness.     Pelvic floor strength I/V  Pelvic floor musculature: Right levator tender, Right obturator tender, Left levator tender, Left obturator tender  POP-Q:   POP-Q  -2                                            Aa   -2                                           Ba  -7                                              C   3                                             Gh  4                                            Pb  8                                            tvl   -2                                            Ap  -2                                            Bp  D     Rectal Exam:  Normal external rectum  Post-Void Residual (PVR) by Bladder Scan: In order to evaluate bladder emptying, we discussed obtaining a postvoid residual and she agreed to this procedure.  Procedure: The ultrasound unit was placed on the patient's abdomen in the suprapubic region after the patient had voided. A PVR of 10 ml was obtained by bladder scan.  Laboratory Results: POC urine: positive glucose, otherwise negative   ASSESSMENT AND PLAN Lisa Fuentes is a 61 y.o. with:  1. Levator spasm   2. Urinary frequency   3. SUI (stress urinary incontinence, female)    - Did not note significant prolapse on exam today. We discussed that the prolapse may be more pronounced at different times, so would warrant a repeat exam in the future to further assess. She feels it has been better lately.  - Admits that pain/ discomfort is her most bothersome symptom, which is explained by her pelvic floor muscle spasm. We discussed this may be exacerbated due to her back pain.  -The origin of pelvic floor muscle spasm can be multifactorial, including primary, reactive to a different pain source, trauma, or even part of a centralized pain syndrome.Treatment options include pelvic floor physical therapy, local (vaginal) or oral  muscle relaxants, pelvic muscle trigger point injections or centrally acting pain medications.   - Prescribed vaginal valium 5mg  nightly for two weeks, then as needed after.  - Referral also placed to physical therapy at [facility] for muscle pain and SUI symptoms.   Return 1 month   Jaquita Folds, MD

## 2021-03-22 NOTE — Patient Instructions (Addendum)
I will prescribe valium 5 mg pills to place vaginally up to 2 times a day for vaginal muscle spasms. Start at night and take one every night for the next several weeks to see if it improves your symptoms. Once you are improving you can taper off the medication and just use as needed. If the medication makes you drowsy then only use at bedtime and/or we can reduce the dose. Do not use gel to place it, as that will prevent the tablet from dissolving.  Instead place 1-2 drops of water on the table before inserting it in the vagina. If the pills do not dissolve well we can switch to a special compounded suppository. Let me know how you are doing on the medication and if you have any questions.     

## 2021-03-28 DIAGNOSIS — G5602 Carpal tunnel syndrome, left upper limb: Secondary | ICD-10-CM | POA: Diagnosis not present

## 2021-03-28 DIAGNOSIS — M189 Osteoarthritis of first carpometacarpal joint, unspecified: Secondary | ICD-10-CM | POA: Diagnosis not present

## 2021-04-05 DIAGNOSIS — M461 Sacroiliitis, not elsewhere classified: Secondary | ICD-10-CM | POA: Diagnosis not present

## 2021-04-06 ENCOUNTER — Other Ambulatory Visit: Payer: Self-pay | Admitting: Cardiovascular Disease

## 2021-04-06 DIAGNOSIS — M189 Osteoarthritis of first carpometacarpal joint, unspecified: Secondary | ICD-10-CM | POA: Diagnosis not present

## 2021-04-06 DIAGNOSIS — M1812 Unilateral primary osteoarthritis of first carpometacarpal joint, left hand: Secondary | ICD-10-CM | POA: Diagnosis not present

## 2021-04-06 DIAGNOSIS — M159 Polyosteoarthritis, unspecified: Secondary | ICD-10-CM | POA: Diagnosis not present

## 2021-04-13 DIAGNOSIS — M2041 Other hammer toe(s) (acquired), right foot: Secondary | ICD-10-CM | POA: Diagnosis not present

## 2021-04-13 DIAGNOSIS — L600 Ingrowing nail: Secondary | ICD-10-CM | POA: Diagnosis not present

## 2021-04-13 DIAGNOSIS — M2042 Other hammer toe(s) (acquired), left foot: Secondary | ICD-10-CM | POA: Diagnosis not present

## 2021-04-13 DIAGNOSIS — M2141 Flat foot [pes planus] (acquired), right foot: Secondary | ICD-10-CM | POA: Diagnosis not present

## 2021-04-13 DIAGNOSIS — M2142 Flat foot [pes planus] (acquired), left foot: Secondary | ICD-10-CM | POA: Diagnosis not present

## 2021-04-13 DIAGNOSIS — B351 Tinea unguium: Secondary | ICD-10-CM | POA: Diagnosis not present

## 2021-04-19 ENCOUNTER — Ambulatory Visit: Payer: Medicare HMO | Admitting: Obstetrics and Gynecology

## 2021-04-20 DIAGNOSIS — G5602 Carpal tunnel syndrome, left upper limb: Secondary | ICD-10-CM | POA: Diagnosis not present

## 2021-04-24 DIAGNOSIS — J301 Allergic rhinitis due to pollen: Secondary | ICD-10-CM | POA: Diagnosis not present

## 2021-04-24 DIAGNOSIS — J342 Deviated nasal septum: Secondary | ICD-10-CM | POA: Diagnosis not present

## 2021-04-24 DIAGNOSIS — J328 Other chronic sinusitis: Secondary | ICD-10-CM | POA: Diagnosis not present

## 2021-04-24 NOTE — Progress Notes (Signed)
? ?Cardiology Office Note   ? ?Date:  04/28/2021  ? ?ID:  Lisa Fuentes, DOB [birth year], MRN [id number] ? ?PCP:  Maryland Pink, MD  ?Cardiologist:  [name], MD  ?Electrophysiologist:  Virl Axe, MD  ? ?Chief Complaint: Follow-up ? ?History of Present Illness:  ? ?Lisa Fuentes is a 61 y.o. female with history of normal coronary arteries by LHC in 03/2020, HFrEF secondary to NICM status post ICD in 2008 with generator change out in 05/2013, Covid infection diagnosed on 02/02/2020, HTN, HLD, hyperthyroidism status post radioactive iodine treatment on thyroid replacement therapy, and OSA on CPAP who presents for follow-up of her cardiomyopathy. ?  ?She was diagnosed with CHF in early 2000.  LHC in 2003 showed no significant CAD.  Nuclear stress test in 01/2015 showed no evidence of ischemia EF of 49%.  Echo in 01/2017 showed an EF of 45 to 50%.  Escalation of GDMT has previously been limited with labile blood pressures.  Echo in 03/2019 showed a stable cardiomyopathy with an EF of 45 to 50%, mild LVH, grade 1 diastolic dysfunction, normal RV systolic function and ventricular cavity size, trivial mitral regurgitation, and an estimated right atrial pressure of 3 mmHg.  Coronary CTA in 04/2019 showed a calcium score of 0 with no evidence of CAD.  She was diagnosed with Covid infection in early 01/2020 and did not require hospital admission.  She was fully vaccinated/boosted.  Following this, she was seen on 03/01/2020 with intermittent dyspnea, chest discomfort, and palpitations with stable weight.  In this setting, she underwent Lexiscan MPI on 03/21/2020 which showed a large defect of moderate severity present in the mid anterior, apical anterior, and apex location consistent with LAD distribution ischemia with an EF of 30-44%, and was overall an intermediate risk scan.  Echo on 03/21/2020, showed an unchanged cardiomyopathy with an EF of 40-45%, unable to assess WMA, Gr1DD, normal RVSF and ventricular cavity size, no  significant valvular abnormalities and an estimated right atrial pressure of 3 mmHg.  Given her abnormal Myoview, she underwent diagnostic LHC on 03/28/2020 which showed normal coronary arteries and a mildly elevated LVEDP at 20 mmHg.  Continued medical therapy for nonischemic cardiomyopathy was recommended.  Her stress test was a false positive.  Subsequent outpatient cardiac monitoring returned which showed a predominant rhythm of sinus with 2 episodes of SVT noted with the longest interval lasting just 4 beats.  Rare PACs and PVCs.  No NSVT or VT.  Most patient triggered events did not correlate with arrhythmia other than sinus tachycardia.  She was last seen in the office in 09/2020 and was doing well from a cardiac perspective.  GDMT has been escalated as able. ? ?She comes in doing reasonably well from a cardiac perspective without symptoms of angina or decompensation.  Since she was last seen, she has been drinking large [employer] sweet teas and frappe's on a daily basis.  With this, she has noted an increase in her weight.  She reports her weight had gotten down to 241 pounds.  Current weight of 251 pounds which is up 4 pounds when compared to her last office visit in 09/2020.  She is tolerating and adherent to all cardiac medications.  No presyncope or syncope.  She does feel a little dizzy today, though has not had much to drink all day.  Otherwise, she is without complaints. ? ? ?Labs independently reviewed: ?12/2020 - potassium 3.5, BUN 11, serum creatinine 0.8, albumin 4.4, AST/ALT normal, TSH 5.735,  free T4 normal, TC 191, TG 62, HDL 44, LDL 134, Hgb 14.0, PLT 173 ? ?Past Medical History:  ?Diagnosis Date  ? Anemia   ? Anxiety   ? panic attacks  ? Arthritis   ? Clotting disorder ([place])   ? Depression   ? Difficult intubation   ? Endometriosis of vagina 09/2013  ? Intra-operative findings of endometriosis implants on cervical stump  ? GERD (gastroesophageal reflux disease)   ? Headache   ?  Hypercholesteremia   ? Hypertension   ? Hypokalemia   ? Hypothyroidism   ? Implantable defibrillator   ? [employer] DOI 2008, replacement 2015  ? Menopausal symptoms   ? Nonischemic cardiomyopathy ([place])   ? Interval improvement EF 40-45% 1/16  ? Shortness of breath dyspnea   ? chronic doe  ? Sleep apnea   ? Treated with CPAP  ? Tachycardia   ? ? ?Past Surgical History:  ?Procedure Laterality Date  ? ANTERIOR AND POSTERIOR REPAIR N/A 11/25/2017  ? Procedure: ANTERIOR (CYSTOCELE) AND POSTERIOR REPAIR (RECTOCELE);  Surgeon: Rubie Maid, MD;  Location: [facility] ORS;  Service: Gynecology;  Laterality: N/A;  ? BREAST CYST ASPIRATION Left 03/22/2014  ? neg/ done by Dr Jamal Collin  ? CARDIAC CATHETERIZATION  2003  ? [facility]: No significant coronary artery disease with reduced ejection fraction.  ? CARDIAC DEFIBRILLATOR PLACEMENT    ? CARPAL TUNNEL RELEASE Right   ? COLONOSCOPY  2015  ? COLONOSCOPY N/A 10/13/2020  ? Procedure: COLONOSCOPY;  Surgeon: Annamaria Helling, DO;  Location: [facility] ENDOSCOPY;  Service: Gastroenterology;  Laterality: N/A;  ? COLONOSCOPY WITH PROPOFOL N/A 10/04/2015  ? Procedure: COLONOSCOPY WITH PROPOFOL;  Surgeon: Lucilla Lame, MD;  Location: [facility] ENDOSCOPY;  Service: Endoscopy;  Laterality: N/A;  ? CORONARY ANGIOPLASTY    ? CORONARY ARTERY BYPASS GRAFT    ? CYSTOSCOPY  11/15/2014  ? Procedure: CYSTOSCOPY;  Surgeon: Rubie Maid, MD;  Location: [facility] ORS;  Service: Gynecology;;  ? DIAGNOSTIC LAPAROSCOPY    ? FOOT SURGERY    ? HAND SURGERY    ? KNEE ARTHROSCOPY Left 03/02/2015  ? Procedure: ARTHROSCOPY  LEFT KNEE, PARTIAL LATERAL  MENISECTOMY, SYNOVECTOMY, MEDIAL & LATERAL CHONDROPLASTY;  Surgeon: Thornton Park, MD;  Location: [facility] ORS;  Service: Orthopedics;  Laterality: Left;  Dr. Mack Guise wanted her surgery switched from [facility] because of the Defibrillator. ? ?patient has a Defibrillator  ?takes Asprin/knows to stop 5days before  ?has had some test done  ? LAPAROSCOPIC SALPINGO OOPHERECTOMY  Left 11/15/2014  ? Procedure: LAPAROSCOPIC OOPHORECTOMY;  Surgeon: Rubie Maid, MD;  Location: [facility] ORS;  Service: Gynecology;  Laterality: Left;  ? LAPAROSCOPIC SALPINGOOPHERECTOMY Right 09/2013  ? also with left salpingectomy  ? LEFT HEART CATH AND CORONARY ANGIOGRAPHY N/A 03/28/2020  ? Procedure: LEFT HEART CATH AND CORONARY ANGIOGRAPHY;  Surgeon: Wellington Hampshire, MD;  Location: [place] [facility];  Service: Cardiovascular;  Laterality: N/A;  ? TONSILLECTOMY    ? TOTAL ABDOMINAL HYSTERECTOMY    ? TRACHELECTOMY N/A 11/15/2014  ? Procedure: TRACHELECTOMY;  Surgeon: Rubie Maid, MD;  Location: [facility] ORS;  Service: Gynecology;  Laterality: N/A;  ? TUBAL LIGATION Bilateral   ? ? ?Current Medications: ?Current Meds  ?Medication Sig  ? aspirin EC 81 MG tablet Take 1 tablet (81 mg total) by mouth daily.  ? celecoxib (CELEBREX) 100 MG capsule Take 100 mg by mouth 2 (two) times daily.  ? cetirizine (ZYRTEC) 10 MG tablet Take 10 mg by mouth daily.  ? cyclobenzaprine (FLEXERIL) 10  MG tablet Take 10 mg by mouth 3 (three) times daily as needed for muscle spasms.  ? diazepam (VALIUM) 5 MG tablet Place 1 tablet vaginally nightly as needed for muscle spasm/ pelvic pain.  ? esomeprazole (NEXIUM) 40 MG capsule Take 40 mg by mouth every morning.   ? fluticasone (FLONASE) 50 MCG/ACT nasal spray Place 1 spray into both nostrils daily.   ? furosemide (LASIX) 20 MG tablet Take 1 tablet (20 mg total) by mouth daily. *PATIENT OCCASIONALLY TAKES AN EXTRA TABLET*  ? gabapentin (NEURONTIN) 100 MG capsule Take 100 mg by mouth every 8 (eight) hours.  ? JARDIANCE 10 MG TABS tablet TAKE 1 TABLET BY MOUTH ONCE DAILY BEFORE BREAKFAST  ? levothyroxine (SYNTHROID) 125 MCG tablet Take 125 mcg by mouth every morning.  ? losartan (COZAAR) 25 MG tablet Take 0.5 tablets (12.5 mg total) by mouth daily.  ? metoprolol succinate (TOPROL XL) 50 MG 24 hr tablet Take 1 tablet (50 mg total) by mouth daily. Take with or immediately following a meal.  ?  nystatin (MYCOSTATIN) powder Apply 1 Bottle topically 4 (four) times daily as needed (irritaton). Reported on 08/18/2015  ? nystatin cream (MYCOSTATIN) Apply topically 2 (two) times daily.  ? oxyCODONE-acetaminophen (PERCOCE

## 2021-04-28 ENCOUNTER — Encounter: Payer: Self-pay | Admitting: Physician Assistant

## 2021-04-28 ENCOUNTER — Ambulatory Visit: Payer: Medicare HMO | Admitting: Physician Assistant

## 2021-04-28 ENCOUNTER — Other Ambulatory Visit
Admission: RE | Admit: 2021-04-28 | Discharge: 2021-04-28 | Disposition: A | Discharge status: Home / Self Care | Payer: Medicare HMO | Attending: Physician Assistant | Admitting: Physician Assistant

## 2021-04-28 VITALS — BP 108/78 | HR 66 | Ht 67.0 in | Wt 251.0 lb

## 2021-04-28 DIAGNOSIS — I428 Other cardiomyopathies: Secondary | ICD-10-CM | POA: Diagnosis not present

## 2021-04-28 DIAGNOSIS — G4733 Obstructive sleep apnea (adult) (pediatric): Secondary | ICD-10-CM | POA: Diagnosis not present

## 2021-04-28 DIAGNOSIS — I10 Essential (primary) hypertension: Secondary | ICD-10-CM | POA: Insufficient documentation

## 2021-04-28 DIAGNOSIS — I4729 Other ventricular tachycardia: Secondary | ICD-10-CM

## 2021-04-28 DIAGNOSIS — Z95810 Presence of automatic (implantable) cardiac defibrillator: Secondary | ICD-10-CM | POA: Diagnosis not present

## 2021-04-28 DIAGNOSIS — I5022 Chronic systolic (congestive) heart failure: Secondary | ICD-10-CM

## 2021-04-28 DIAGNOSIS — Z9989 Dependence on other enabling machines and devices: Secondary | ICD-10-CM | POA: Diagnosis not present

## 2021-04-28 LAB — BASIC METABOLIC PANEL
Anion gap: 10 (ref 5–15)
BUN: 17 mg/dL (ref 6–20)
CO2: 25 mmol/L (ref 22–32)
Calcium: 9.3 mg/dL (ref 8.9–10.3)
Chloride: 104 mmol/L (ref 98–111)
Creatinine, Ser: 1.12 mg/dL — ABNORMAL HIGH (ref 0.44–1.00)
GFR, Estimated: 57 mL/min — ABNORMAL LOW (ref >60)
Glucose, Bld: 87 mg/dL (ref 70–99)
Potassium: 3.9 mmol/L (ref 3.5–5.1)
Sodium: 139 mmol/L (ref 135–145)

## 2021-04-28 NOTE — Patient Instructions (Signed)
Medication Instructions:  ? ?Your physician recommends that you continue on your current medications as directed. Please refer to the Current Medication list given to you today. ? ?*If you need a refill on your cardiac medications before your next appointment, please call your pharmacy* ? ? ?Lab Work: ? ?[facility] medical mall [facility]: BMET ? ?If you have labs (blood work) drawn today and your tests are completely normal, you will receive your results only by: ?MyChart Message (if you have MyChart) OR ?A paper copy in the mail ?If you have any lab test that is abnormal or we need to change your treatment, we will call you to review the results. ? ? ?Testing/Procedures: ? ?None ordered ? ? ?Follow-Up: ?At [facility], you and your health needs are our priority.  As part of our continuing mission to provide you with exceptional heart care, we have created designated Provider Care Teams.  These Care Teams include your primary Cardiologist (physician) and Advanced Practice Providers (APPs -  Physician Assistants and Nurse Practitioners) who all work together to provide you with the care you need, when you need it. ? ?We recommend signing up for the patient portal called "MyChart".  Sign up information is provided on this After Visit Summary.  MyChart is used to connect with patients for Virtual Visits (Telemedicine).  Patients are able to view lab/test results, encounter notes, upcoming appointments, etc.  Non-urgent messages can be sent to your provider as well.   ?To learn more about what you can do with MyChart, go to [email or web address].   ? ?Your next appointment:   ?6 month(s) ? ?The format for your next appointment:   ?In Person ? ?Provider:   ?You may see [name], MD or one of the following Advanced Practice Providers on your designated Care Team:   ?Murray Hodgkins, NP ?Christell Faith, PA-C ?Cadence Kathlen Mody, PA-C ?

## 2021-05-01 ENCOUNTER — Other Ambulatory Visit: Payer: Self-pay | Admitting: *Deleted

## 2021-05-01 DIAGNOSIS — I5022 Chronic systolic (congestive) heart failure: Secondary | ICD-10-CM

## 2021-05-01 DIAGNOSIS — I428 Other cardiomyopathies: Secondary | ICD-10-CM

## 2021-05-03 DIAGNOSIS — G5602 Carpal tunnel syndrome, left upper limb: Secondary | ICD-10-CM | POA: Diagnosis not present

## 2021-05-08 ENCOUNTER — Other Ambulatory Visit: Payer: Self-pay | Admitting: Cardiovascular Disease

## 2021-05-08 DIAGNOSIS — I5022 Chronic systolic (congestive) heart failure: Secondary | ICD-10-CM

## 2021-05-08 DIAGNOSIS — J342 Deviated nasal septum: Secondary | ICD-10-CM | POA: Diagnosis not present

## 2021-05-15 ENCOUNTER — Other Ambulatory Visit (INDEPENDENT_AMBULATORY_CARE_PROVIDER_SITE_OTHER): Payer: Medicare HMO

## 2021-05-15 DIAGNOSIS — I428 Other cardiomyopathies: Secondary | ICD-10-CM

## 2021-05-15 DIAGNOSIS — I5022 Chronic systolic (congestive) heart failure: Secondary | ICD-10-CM

## 2021-05-16 LAB — BASIC METABOLIC PANEL
BUN/Creatinine Ratio: 20 (ref 9–23)
BUN: 17 mg/dL (ref 6–24)
CO2: 21 mmol/L (ref 20–29)
Calcium: 9.7 mg/dL (ref 8.7–10.2)
Chloride: 108 mmol/L — ABNORMAL HIGH (ref 96–106)
Creatinine, Ser: 0.84 mg/dL (ref 0.57–1.00)
Glucose: 127 mg/dL — ABNORMAL HIGH (ref 70–99)
Potassium: 3.6 mmol/L (ref 3.5–5.2)
Sodium: 143 mmol/L (ref 134–144)
eGFR: 80 mL/min/{1.73_m2} (ref >59)

## 2021-05-17 DIAGNOSIS — J343 Hypertrophy of nasal turbinates: Secondary | ICD-10-CM | POA: Diagnosis not present

## 2021-05-17 DIAGNOSIS — J342 Deviated nasal septum: Secondary | ICD-10-CM | POA: Diagnosis not present

## 2021-05-17 DIAGNOSIS — J328 Other chronic sinusitis: Secondary | ICD-10-CM | POA: Diagnosis not present

## 2021-05-18 ENCOUNTER — Ambulatory Visit: Payer: Medicare HMO | Admitting: Obstetrics and Gynecology

## 2021-05-18 NOTE — Progress Notes (Deleted)
Lisa Fuentes Return Visit  SUBJECTIVE  History of Present Illness: Lisa Fuentes is a 61 y.o. female seen in follow-up for pelvic pain/ pelvic floor muscle spasm. Plan at last visit was ***.     Past Medical History: Patient  has a past medical history of Anemia, Anxiety, Arthritis, Clotting disorder ([place]), Depression, Difficult intubation, Endometriosis of vagina (09/2013), GERD (gastroesophageal reflux disease), Headache, Hypercholesteremia, Hypertension, Hypokalemia, Hypothyroidism, Implantable defibrillator, Menopausal symptoms, Nonischemic cardiomyopathy ([place]), Shortness of breath dyspnea, Sleep apnea, and Tachycardia.   Past Surgical History: She  has a past surgical history that includes Cardiac defibrillator placement; Total abdominal hysterectomy; Cardiac catheterization (2003); Tonsillectomy; Foot surgery; Colonoscopy (2015); Tubal ligation (Bilateral); Laparoscopic salpingoopherectomy (Right, 09/2013); Coronary angioplasty; Hand surgery; Laparoscopic salpingo oophorectomy (Left, 11/15/2014); Trachelectomy (N/A, 11/15/2014); Cystoscopy (11/15/2014); Knee arthroscopy (Left, 03/02/2015); Colonoscopy with propofol (N/A, 10/04/2015); Anterior and posterior repair (N/A, 11/25/2017); Carpal tunnel release (Right); LEFT HEART CATH AND CORONARY ANGIOGRAPHY (N/A, 03/28/2020); Breast cyst aspiration (Left, 03/22/2014); Coronary artery bypass graft; Diagnostic laparoscopy; and Colonoscopy (N/A, 10/13/2020).   Medications: She has a current medication list which includes the following prescription(s): aspirin ec, celecoxib, cetirizine, cyclobenzaprine, diazepam, esomeprazole, euthyrox, fluticasone, furosemide, gabapentin, jardiance, levothyroxine, losartan, metoprolol succinate, nystatin, nystatin cream, oxycodone-acetaminophen, sertraline, spironolactone, and terbinafine.   Allergies: Patient is allergic to sulfa antibiotics and sulfamethoxazole-trimethoprim.   Social  History: Patient  reports that she has never smoked. She has never used smokeless tobacco. She reports current alcohol use. She reports that she does not use drugs.      OBJECTIVE     Physical Exam: There were no vitals filed for this visit. Gen: No apparent distress, A&O x 3.  Detailed Urogynecologic Evaluation:  Deferred. Prior exam showed:      View : No data to display.             ASSESSMENT AND PLAN    Ms. Rosol is a 61 y.o. with:  No diagnosis found.

## 2021-05-19 ENCOUNTER — Telehealth: Payer: Self-pay | Admitting: Physician Assistant

## 2021-05-19 NOTE — Telephone Encounter (Signed)
? ?  Primary Cardiologist: [name], MD ? ?Chart reviewed as part of pre-operative protocol coverage. Given past medical history and time since last visit, based on ACC/AHA guidelines, Lisa Fuentes would be at acceptable risk for the planned procedure without further cardiovascular testing.  ? ?Her RCRI is a class II risk, 0.9% risk of major cardiac event. ? ?I will route this recommendation to the requesting party via Epic fax function and remove from pre-op pool. ? ?Please call with questions. ? ?Jossie Ng. Elsi Stelzer NP-C ? ?  ?05/19/2021, 4:26 PM ?[place] ?[address] ?Office [phone] Fax [phone] ? ? ? ? ?

## 2021-05-19 NOTE — Telephone Encounter (Signed)
   Pre-operative Risk Assessment    Patient Name: Lisa Fuentes  DOB: 08/20/1960 MRN: [id number]{ HEARTCARE STAFF-IMPORTANT INSTRUCTIONS 1 Red and Blue Text will auto delete once note is signed or closed. 2 Press F2 to navigate through template.   3 On drop down lists, L click to select >> R click to activate next field 4 Reason for Visit format is IMPORTANT!!  See Directions on No. 2 below. 5 Please review chart to determine if there is already a clearance note open for this procedure!!  DO NOT duplicate if a note already exists!!         Request for Surgical Clearance{ 1. What type of surgery is being performed? Enter name of procedure below and number of teeth if dental extraction.    Procedure:   sINUS SURGERY 2. When is this surgery scheduled? Press F2 to enter date below and place date in Reason for Visit (see directions below). :1} Date of Surgery:  Clearance TBD                              For convenience, highlight and copy (CTL+C) the Clearance MM/DD/YY phrase above. Click here to go to Reason for Visit.  Paste (CTL+V) the date.  Merchandiser, retail.  Then click button underneath called Add Clearance MM/DD/YY as free text.       3. What is the name of the Surgeon, the Surgeon's Group or Practice, phone and fax number?  Press F2 and list below  Surgeon:  DR. Sherilyn Dacosta Group or Practice Name:  [facility] Phone number:  [phone] Fax number:  [phone]  4. What type of clearance is requested?  Medical or Cardiac Clearance only?  Pharmacy Clearance Only (Request is to hold medication only)?  Or Both?  Press F2 and select the clearance requested.  If both are needed, select both from the drop down list.      Type of Clearance Requested:   - Medical  5. What type of anesthesia will be used?  Press F2 and select the anesthesia to be used for the procedure.   Type of Anesthesia:  Not Indicated  6. Are there any other requests or questions from the surgeon?    Additional  requests/questions:    Signed, Eli Phillips   05/19/2021, 12:02 PM

## 2021-05-23 ENCOUNTER — Ambulatory Visit (INDEPENDENT_AMBULATORY_CARE_PROVIDER_SITE_OTHER): Payer: Medicare HMO

## 2021-05-23 DIAGNOSIS — I5022 Chronic systolic (congestive) heart failure: Secondary | ICD-10-CM

## 2021-05-23 DIAGNOSIS — I428 Other cardiomyopathies: Secondary | ICD-10-CM

## 2021-05-24 LAB — CUP PACEART REMOTE DEVICE CHECK
Battery Remaining Longevity: 41 mo
Battery Voltage: 2.98 V
Brady Statistic RV Percent Paced: 0.01 %
Date Time Interrogation Session: 20230425043825
HighPow Impedance: 48 Ohm
HighPow Impedance: 53 Ohm
Implantable Lead Implant Date: 20150508
Implantable Lead Location: 753860
Implantable Lead Model: 185
Implantable Lead Serial Number: 194163
Implantable Pulse Generator Implant Date: 20150508
Lead Channel Impedance Value: 456 Ohm
Lead Channel Impedance Value: 475 Ohm
Lead Channel Pacing Threshold Amplitude: 1 V
Lead Channel Pacing Threshold Pulse Width: 0.4 ms
Lead Channel Sensing Intrinsic Amplitude: 11.75 mV
Lead Channel Sensing Intrinsic Amplitude: 11.75 mV
Lead Channel Setting Pacing Amplitude: 2 V
Lead Channel Setting Pacing Pulse Width: 0.4 ms
Lead Channel Setting Sensing Sensitivity: 0.3 mV

## 2021-05-30 ENCOUNTER — Other Ambulatory Visit: Payer: Self-pay | Admitting: Physician Assistant

## 2021-05-30 ENCOUNTER — Telehealth: Payer: Self-pay | Admitting: Internal Medicine

## 2021-05-30 NOTE — Telephone Encounter (Signed)
Raquel Sarna from [employer] is calling also needing clearance on the patients dfib for 05/19/2021 telephone note clearance request.  ? ?

## 2021-05-30 NOTE — Telephone Encounter (Signed)
Attempted to contact Raquel Sarna back to advise patient is overdue with Dr. Caryl Comes and will need in-clinic check prior to clearance. No answer, unable to leave VM. ? ?Will forward to scheduling to contact patient about apt.  ?

## 2021-05-30 NOTE — Telephone Encounter (Signed)
I will forward this phone note to our device clinic.  ?

## 2021-06-01 DIAGNOSIS — L600 Ingrowing nail: Secondary | ICD-10-CM | POA: Diagnosis not present

## 2021-06-01 DIAGNOSIS — B351 Tinea unguium: Secondary | ICD-10-CM | POA: Diagnosis not present

## 2021-06-07 NOTE — Telephone Encounter (Signed)
Raquel Sarna called to f/u to mention that pt is on cruise and asks if we can try to reach out to pt on Monday.  ?

## 2021-06-08 NOTE — Telephone Encounter (Signed)
Patient is scheduled to see Oda Kilts 06/19/21 at 11:00 am  ?

## 2021-06-08 NOTE — Progress Notes (Signed)
Remote ICD transmission.   

## 2021-06-12 NOTE — Progress Notes (Signed)
Electrophysiology Office Note Date: 06/12/2021  ID:  Lisa Fuentes, DOB [birth year], MRN [id number]  PCP: Lisa Pink, MD Primary Cardiologist: Lisa Sacramento, MD Electrophysiologist: Lisa Axe, MD   CC: Routine ICD follow-up  Lisa Fuentes is a 61 y.o. female seen today for Lisa Axe, MD for routine electrophysiology followup.    She is also seeking cardiac and device clearance. In person visit required as no device check > 12 months.   Since last being seen in our clinic the patient reports doing well from a cardiac perspective.  she denies chest pain, palpitations, dyspnea, PND, orthopnea, nausea, vomiting, dizziness, syncope, edema, weight gain, or early satiety. She has not had ICD shocks.   Device History: Medtronic Single Chamber ICD implanted 2014 for NICM  Past Medical History:  Diagnosis Date   Anemia    Anxiety    panic attacks   Arthritis    Clotting disorder ([place])    Depression    Difficult intubation    Endometriosis of vagina 09/2013   Intra-operative findings of endometriosis implants on cervical stump   GERD (gastroesophageal reflux disease)    Headache    Hypercholesteremia    Hypertension    Hypokalemia    Hypothyroidism    Implantable defibrillator    Medtronic DOI 2008, replacement 2015   Menopausal symptoms    Nonischemic cardiomyopathy (HCC)    Interval improvement EF 40-45% 1/16   Shortness of breath dyspnea    chronic doe   Sleep apnea    Treated with CPAP   Tachycardia    Past Surgical History:  Procedure Laterality Date   ANTERIOR AND POSTERIOR REPAIR N/A 11/25/2017   Procedure: ANTERIOR (CYSTOCELE) AND POSTERIOR REPAIR (RECTOCELE);  Surgeon: Lisa Maid, MD;  Location: [facility] ORS;  Service: Gynecology;  Laterality: N/A;   BREAST CYST ASPIRATION Left 03/22/2014   neg/ done by Dr Lisa Fuentes   CARDIAC CATHETERIZATION  2003   [place]: No significant coronary artery disease with reduced ejection fraction.   CARDIAC DEFIBRILLATOR  PLACEMENT     CARPAL TUNNEL RELEASE Right    COLONOSCOPY  2015   COLONOSCOPY N/A 10/13/2020   Procedure: COLONOSCOPY;  Surgeon: Lisa Helling, DO;  Location: [facility] ENDOSCOPY;  Service: Gastroenterology;  Laterality: N/A;   COLONOSCOPY WITH PROPOFOL N/A 10/04/2015   Procedure: COLONOSCOPY WITH PROPOFOL;  Surgeon: Lisa Lame, MD;  Location: [facility] ENDOSCOPY;  Service: Endoscopy;  Laterality: N/A;   CORONARY ANGIOPLASTY     CORONARY ARTERY BYPASS GRAFT     CYSTOSCOPY  11/15/2014   Procedure: CYSTOSCOPY;  Surgeon: Lisa Maid, MD;  Location: [facility] ORS;  Service: Gynecology;;   DIAGNOSTIC LAPAROSCOPY     FOOT SURGERY     HAND SURGERY     KNEE ARTHROSCOPY Left 03/02/2015   Procedure: ARTHROSCOPY  LEFT KNEE, PARTIAL LATERAL  MENISECTOMY, SYNOVECTOMY, MEDIAL & LATERAL CHONDROPLASTY;  Surgeon: Lisa Park, MD;  Location: [facility] ORS;  Service: Orthopedics;  Laterality: Left;  Dr. Mack Fuentes wanted her surgery switched from [facility] because of the Defibrillator.  patient has a Defibrillator  takes Asprin/knows to stop 5days before  has had some test done   LAPAROSCOPIC SALPINGO OOPHERECTOMY Left 11/15/2014   Procedure: LAPAROSCOPIC OOPHORECTOMY;  Surgeon: Lisa Maid, MD;  Location: [facility] ORS;  Service: Gynecology;  Laterality: Left;   LAPAROSCOPIC SALPINGOOPHERECTOMY Right 09/2013   also with left salpingectomy   LEFT HEART CATH AND CORONARY ANGIOGRAPHY N/A 03/28/2020   Procedure: LEFT HEART CATH AND CORONARY ANGIOGRAPHY;  Surgeon: Lisa Fuentes  A, MD;  Location: [facility];  Service: Cardiovascular;  Laterality: N/A;   TONSILLECTOMY     TOTAL ABDOMINAL HYSTERECTOMY     TRACHELECTOMY N/A 11/15/2014   Procedure: TRACHELECTOMY;  Surgeon: Lisa Maid, MD;  Location: [facility] ORS;  Service: Gynecology;  Laterality: N/A;   TUBAL LIGATION Bilateral     Current Outpatient Medications  Medication Sig Dispense Refill   aspirin EC 81 MG tablet Take 1 tablet (81 mg total) by  mouth daily.     celecoxib (CELEBREX) 100 MG capsule Take 100 mg by mouth 2 (two) times daily.     cetirizine (ZYRTEC) 10 MG tablet Take 10 mg by mouth daily.     cyclobenzaprine (FLEXERIL) 10 MG tablet Take 10 mg by mouth 3 (three) times daily as needed for muscle spasms.     diazepam (VALIUM) 5 MG tablet Place 1 tablet vaginally nightly as needed for muscle spasm/ pelvic pain. 30 tablet 0   esomeprazole (NEXIUM) 40 MG capsule Take 40 mg by mouth every morning.      EUTHYROX 137 MCG tablet Take 125 mcg by mouth every morning. Pt changed med (Patient not taking: Reported on 04/28/2021)     fluticasone (FLONASE) 50 MCG/ACT nasal spray Place 1 spray into both nostrils daily.      furosemide (LASIX) 20 MG tablet Take 1 tablet (20 mg total) by mouth daily. *PATIENT OCCASIONALLY TAKES AN EXTRA TABLET* 90 tablet 1   gabapentin (NEURONTIN) 100 MG capsule Take 100 mg by mouth every 8 (eight) hours.     JARDIANCE 10 MG TABS tablet TAKE 1 TABLET BY MOUTH ONCE DAILY BEFORE BREAKFAST 30 tablet 5   levothyroxine (SYNTHROID) 125 MCG tablet Take 125 mcg by mouth every morning.     losartan (COZAAR) 25 MG tablet Take 0.5 tablets (12.5 mg total) by mouth daily. 45 tablet 0   metoprolol succinate (TOPROL-XL) 50 MG 24 hr tablet TAKE 1 TABLET BY MOUTH ONCE DAILY WITH OR IMMEDIATELY FOLLOWING A MEAL 90 tablet 0   nystatin (MYCOSTATIN) powder Apply 1 Bottle topically 4 (four) times daily as needed (irritaton). Reported on 08/18/2015     nystatin cream (MYCOSTATIN) Apply topically 2 (two) times daily.     oxyCODONE-acetaminophen (PERCOCET/ROXICET) 5-325 MG tablet Take by mouth every 4 (four) hours as needed for severe pain.     sertraline (ZOLOFT) 100 MG tablet Take 100 mg by mouth daily.     spironolactone (ALDACTONE) 25 MG tablet Take 1/2 (one-half) tablet by mouth once daily 45 tablet 3   terbinafine (LAMISIL) 250 MG tablet Take 250 mg by mouth daily.     No current facility-administered medications for this visit.     Allergies:   Sulfa antibiotics and Sulfamethoxazole-trimethoprim   Social History: Social History   Socioeconomic History   Marital status: Divorced    Spouse name: Not on file   Number of children: Not on file   Years of education: Not on file   Highest education level: Not on file  Occupational History   Not on file  Tobacco Use   Smoking status: Never   Smokeless tobacco: Never  Vaping Use   Vaping Use: Never used  Substance and Sexual Activity   Alcohol use: Yes    Alcohol/week: 0.0 standard drinks    Comment: occassionally   Drug use: No   Sexual activity: Not Currently    Birth control/protection: Surgical  Other Topics Concern   Not on file  Social History Narrative  Not on file   Social Determinants of Health   Financial Resource Strain: Not on file  Food Insecurity: Not on file  Transportation Needs: Not on file  Physical Activity: Not on file  Stress: Not on file  Social Connections: Not on file  Intimate Partner Violence: Not on file    Family History: Family History  Problem Relation Age of Onset   Hypertension Mother    Hyperlipidemia Mother    Stroke Mother    Colon cancer Mother    Breast cancer Sister 36   Ovarian cancer Sister    Throat cancer Brother     Review of Systems: All other systems reviewed and are otherwise negative except as noted above.   Physical Exam: There were no vitals filed for this visit.   GEN- The patient is well appearing, alert and oriented x 3 today.   HEENT: normocephalic, atraumatic; sclera clear, conjunctiva Fuentes; hearing intact; oropharynx clear; neck supple, no JVP Lymph- no cervical lymphadenopathy Lungs- Clear to ausculation bilaterally, normal work of breathing.  No wheezes, rales, rhonchi Heart- Regular rate and rhythm, no murmurs, rubs or gallops, PMI not laterally displaced GI- soft, non-tender, non-distended, bowel sounds present, no hepatosplenomegaly Extremities- no clubbing or cyanosis.  No edema; DP/PT/radial pulses 2+ bilaterally MS- no significant deformity or atrophy Skin- warm and dry, no rash or lesion; ICD pocket well healed Psych- euthymic mood, full affect Neuro- strength and sensation are intact  ICD interrogation- reviewed in detail today,  See PACEART report  EKG:  EKG is not ordered today. Personal review of EKG ordered  04/28/2021  shows NSR at 64 bpm  Recent Labs: 05/15/2021: BUN 17; Creatinine, Ser 0.84; Potassium 3.6; Sodium 143   Wt Readings from Last 3 Encounters:  04/28/21 251 lb (113.9 kg)  03/22/21 244 lb (110.7 kg)  02/01/21 244 lb (110.7 kg)     Other studies Reviewed: Additional studies/ records that were reviewed today include: Previous EP office notes.   Assessment and Plan:  1.  Chronic systolic dysfunction s/p Medtronic single chamber ICD  euvolemic today Stable on an appropriate medical regimen Normal ICD function See Pace Art report No changes today  2. H/o VT Quiescent on device Rare NSVT  3. HTN Stable on current regimen   4. Non cardiac Chest pain Normal cors by cath 03/2020 after false positive stress  5. Cardiac Clearance Most recent EF 40-45% No recent VT Device with normal function and not dependent.  No s/s ischemia or DOE.  She may proceed without further cardiac work up.    Current medicines are reviewed at length with the patient today.     Disposition:   Follow up with Dr. Caryl Comes in 12 months    Signed, Lisa Friar, PA-C  06/12/2021 8:57 AM  [place] [address] [place] [address] [phone] (office) [phone] (fax)

## 2021-06-15 ENCOUNTER — Ambulatory Visit: Admit: 2021-06-15 | Payer: Medicare HMO | Admitting: Otolaryngology

## 2021-06-15 SURGERY — SINUS SURGERY, WITH IMAGING GUIDANCE
Anesthesia: General | Laterality: Right

## 2021-06-17 ENCOUNTER — Other Ambulatory Visit: Payer: Self-pay | Admitting: Cardiovascular Disease

## 2021-06-19 ENCOUNTER — Encounter: Payer: Self-pay | Admitting: Student

## 2021-06-19 ENCOUNTER — Ambulatory Visit: Payer: Medicare HMO | Admitting: Student

## 2021-06-19 VITALS — BP 112/72 | HR 57 | Ht 67.0 in | Wt 256.2 lb

## 2021-06-19 DIAGNOSIS — Z95810 Presence of automatic (implantable) cardiac defibrillator: Secondary | ICD-10-CM | POA: Diagnosis not present

## 2021-06-19 DIAGNOSIS — I428 Other cardiomyopathies: Secondary | ICD-10-CM

## 2021-06-19 DIAGNOSIS — I10 Essential (primary) hypertension: Secondary | ICD-10-CM | POA: Diagnosis not present

## 2021-06-19 DIAGNOSIS — I5022 Chronic systolic (congestive) heart failure: Secondary | ICD-10-CM

## 2021-06-19 LAB — CUP PACEART INCLINIC DEVICE CHECK
Battery Remaining Longevity: 43 mo
Battery Voltage: 2.97 V
Brady Statistic RV Percent Paced: 0.01 %
Date Time Interrogation Session: 20230522111831
HighPow Impedance: 50 Ohm
HighPow Impedance: 64 Ohm
Implantable Lead Implant Date: 20150508
Implantable Lead Location: 753860
Implantable Lead Model: 185
Implantable Lead Serial Number: 194163
Implantable Pulse Generator Implant Date: 20150508
Lead Channel Impedance Value: 513 Ohm
Lead Channel Impedance Value: 589 Ohm
Lead Channel Pacing Threshold Amplitude: 0.5 V
Lead Channel Pacing Threshold Pulse Width: 0.4 ms
Lead Channel Sensing Intrinsic Amplitude: 11 mV
Lead Channel Sensing Intrinsic Amplitude: 13.625 mV
Lead Channel Setting Pacing Amplitude: 2 V
Lead Channel Setting Pacing Pulse Width: 0.4 ms
Lead Channel Setting Sensing Sensitivity: 0.3 mV

## 2021-06-19 NOTE — Patient Instructions (Signed)
Medication Instructions:  Your physician recommends that you continue on your current medications as directed. Please refer to the Current Medication list given to you today.  *If you need a refill on your cardiac medications before your next appointment, please call your pharmacy*   Lab Work: None If you have labs (blood work) drawn today and your tests are completely normal, you will receive your results only by: . MyChart Message (if you have MyChart) OR . A paper copy in the mail If you have any lab test that is abnormal or we need to change your treatment, we will call you to review the results.   Follow-Up: At [facility], you and your health needs are our priority.  As part of our continuing mission to provide you with exceptional heart care, we have created designated Provider Care Teams.  These Care Teams include your primary Cardiologist (physician) and Advanced Practice Providers (APPs -  Physician Assistants and Nurse Practitioners) who all work together to provide you with the care you need, when you need it.  Your next appointment:   1 year(s)  The format for your next appointment:   In Person  Provider:   Steven Klein, MD   

## 2021-06-20 DIAGNOSIS — J342 Deviated nasal septum: Secondary | ICD-10-CM | POA: Diagnosis not present

## 2021-06-20 DIAGNOSIS — J343 Hypertrophy of nasal turbinates: Secondary | ICD-10-CM | POA: Diagnosis not present

## 2021-06-20 DIAGNOSIS — J328 Other chronic sinusitis: Secondary | ICD-10-CM | POA: Diagnosis not present

## 2021-06-29 ENCOUNTER — Other Ambulatory Visit: Payer: Self-pay | Admitting: Family Medicine

## 2021-06-29 DIAGNOSIS — M62830 Muscle spasm of back: Secondary | ICD-10-CM | POA: Diagnosis not present

## 2021-06-29 DIAGNOSIS — Z1231 Encounter for screening mammogram for malignant neoplasm of breast: Secondary | ICD-10-CM

## 2021-06-29 DIAGNOSIS — M47817 Spondylosis without myelopathy or radiculopathy, lumbosacral region: Secondary | ICD-10-CM | POA: Diagnosis not present

## 2021-06-29 DIAGNOSIS — M533 Sacrococcygeal disorders, not elsewhere classified: Secondary | ICD-10-CM | POA: Diagnosis not present

## 2021-06-29 DIAGNOSIS — G894 Chronic pain syndrome: Secondary | ICD-10-CM | POA: Diagnosis not present

## 2021-06-29 DIAGNOSIS — Z79899 Other long term (current) drug therapy: Secondary | ICD-10-CM | POA: Diagnosis not present

## 2021-06-29 DIAGNOSIS — M545 Low back pain, unspecified: Secondary | ICD-10-CM | POA: Diagnosis not present

## 2021-06-29 DIAGNOSIS — R202 Paresthesia of skin: Secondary | ICD-10-CM | POA: Diagnosis not present

## 2021-07-10 ENCOUNTER — Inpatient Hospital Stay
Admission: RE | Admit: 2021-07-10 | Discharge: 2021-07-10 | Disposition: A | Discharge status: Home / Self Care | Payer: Medicare HMO | Source: Ambulatory Visit

## 2021-07-10 DIAGNOSIS — M25511 Pain in right shoulder: Secondary | ICD-10-CM | POA: Diagnosis not present

## 2021-07-10 DIAGNOSIS — M25512 Pain in left shoulder: Secondary | ICD-10-CM | POA: Diagnosis not present

## 2021-07-10 NOTE — Patient Instructions (Addendum)
Your procedure is scheduled on: Wednesday, June 21 Report to the Registration Desk on the 1st floor of the [facility]. To find out your arrival time, please call [phone] between 1PM - 3PM on: Tuesday, June 20 If your arrival time is 6:00 am, do not arrive prior to that time as the [place] entrance doors do not open until 6:00 am.  REMEMBER: Instructions that are not followed completely may result in serious medical risk, up to and including death; or upon the discretion of your surgeon and anesthesiologist your surgery may need to be rescheduled.  Do not eat food after midnight the night before surgery.  No gum chewing, lozengers or hard candies.  You may however, drink CLEAR liquids up to 2 hours before you are scheduled to arrive for your surgery. Do not drink anything within 2 hours of your scheduled arrival time.  Clear liquids include: - water  - apple juice without pulp - gatorade (not RED colors) - black coffee or tea (Do NOT add milk or creamers to the coffee or tea) Do NOT drink anything that is not on this list.  TAKE THESE MEDICATIONS THE MORNING OF SURGERY WITH A SIP OF WATER:  Esomeprazole (nexium) - (take one the night before and one on the morning of surgery - helps to prevent nausea after surgery.) Metoprolol Sertraline   Jardiance - hold 3 days prior to surgery.  Follow recommendations from Cardiologist, Pulmonologist or PCP regarding stopping Aspirin, Coumadin, Plavix, Eliquis, Pradaxa, or Pletal.  One week prior to surgery: Stop Anti-inflammatories (NSAIDS) such as Advil, Aleve, Ibuprofen, Motrin, Naproxen, Naprosyn and Aspirin based products such as Excedrin, Goodys Powder, BC Powder. Stop ANY OVER THE COUNTER supplements until after surgery. You may however, continue to take Tylenol if needed for pain up until the day of surgery.  No Alcohol for 24 hours before or after surgery.  No Smoking including e-cigarettes for 24 hours prior to surgery.   No chewable tobacco products for at least 6 hours prior to surgery.  No nicotine patches on the day of surgery.  Do not use any "recreational" drugs for at least a week prior to your surgery.  Please be advised that the combination of cocaine and anesthesia may have negative outcomes, up to and including death. If you test positive for cocaine, your surgery will be cancelled.  On the morning of surgery brush your teeth with toothpaste and water, you may rinse your mouth with mouthwash if you wish. Do not swallow any toothpaste or mouthwash.  Do not wear jewelry, make-up, hairpins, clips or nail polish.  Do not wear lotions, powders, or perfumes.   Contact lenses, hearing aids and dentures may not be worn into surgery.  Do not bring valuables to the hospital. [facility] is not responsible for any missing/lost belongings or valuables.   Bring your C-PAP to the hospital with you in case you may have to spend the night.   Notify your doctor if there is any change in your medical condition (cold, fever, infection).  Wear comfortable clothing (specific to your surgery type) to the hospital.  After surgery, you can help prevent lung complications by doing breathing exercises.  Take deep breaths and cough every 1-2 hours. Your doctor may order a device called an Incentive Spirometer to help you take deep breaths.  If you are being discharged the day of surgery, you will not be allowed to drive home. You will need a responsible adult (18 years or older)  to drive you home and stay with you that night.   If you are taking public transportation, you will need to have a responsible adult (18 years or older) with you. Please confirm with your physician that it is acceptable to use public transportation.   Please call the [place] Dept. at [phone] if you have any questions about these instructions.  Surgery Visitation Policy:  Patients undergoing a surgery or procedure  may have two family members or support persons with them as long as the person is not COVID-19 positive or experiencing its symptoms.

## 2021-07-10 NOTE — Patient Instructions (Addendum)
Your procedure is scheduled on: Wednesday, June 21 Report to the Registration Desk on the 1st floor of the [facility]. To find out your arrival time, please call [phone] between 1PM - 3PM on: Tuesday, June 20 If your arrival time is 6:00 am, do not arrive prior to that time as the [place] entrance doors do not open until 6:00 am.   REMEMBER: Instructions that are not followed completely may result in serious medical risk, up to and including death; or upon the discretion of your surgeon and anesthesiologist your surgery may need to be rescheduled.   Do not eat food after midnight the night before surgery.  No gum chewing, lozengers or hard candies.   You may however, drink CLEAR liquids up to 2 hours before you are scheduled to arrive for your surgery. Do not drink anything within 2 hours of your scheduled arrival time.   Clear liquids include: - water  - apple juice without pulp - gatorade (not RED colors) - black coffee or tea (Do NOT add milk or creamers to the coffee or tea) Do NOT drink anything that is not on this list.   TAKE THESE MEDICATIONS THE MORNING OF SURGERY WITH A SIP OF WATER:   Esomeprazole (nexium) - (take one the night before and one on the morning of surgery - helps to prevent nausea after surgery.) Gabapentin Levothyroxine Metoprolol Sertraline   Follow recommendations from Cardiologist or PCP regarding stopping Aspirin. Per instructions given; stop 5 days prior to surgery. Last day to take aspirin is Thursday, June 15. Resume AFTER surgery per surgeon instruction.   One week prior to surgery: starting June 14 Stop Anti-inflammatories (NSAIDS) such as Advil, Aleve, Ibuprofen, Motrin, Naproxen, Naprosyn and Aspirin based products such as Excedrin, Goodys Powder, BC Powder. Stop ANY OVER THE COUNTER supplements until after surgery. You may however, continue to take Tylenol if needed for pain up until the day of surgery.   No Alcohol for 24 hours  before or after surgery.   No Smoking including e-cigarettes for 24 hours prior to surgery.  No chewable tobacco products for at least 6 hours prior to surgery.  No nicotine patches on the day of surgery.   Do not use any "recreational" drugs for at least a week prior to your surgery.  Please be advised that the combination of cocaine and anesthesia may have negative outcomes, up to and including death. If you test positive for cocaine, your surgery will be cancelled.   On the morning of surgery brush your teeth with toothpaste and water, you may rinse your mouth with mouthwash if you wish. Do not swallow any toothpaste or mouthwash.   Do not wear jewelry, make-up, hairpins, clips or nail polish.   Do not wear lotions, powders, or perfumes.    Contact lenses, hearing aids and dentures may not be worn into surgery.   Do not bring valuables to the hospital. [facility] is not responsible for any missing/lost belongings or valuables.    Bring your C-PAP to the hospital with you in case you may have to spend the night.    Notify your doctor if there is any change in your medical condition (cold, fever, infection).   Wear comfortable clothing (specific to your surgery type) to the hospital.   If you are being discharged the day of surgery, you will not be allowed to drive home. You will need a responsible adult (18 years or older) to drive you home and stay  with you that night.    If you are taking public transportation, you will need to have a responsible adult (18 years or older) with you. Please confirm with your physician that it is acceptable to use public transportation.    Please call the [place] Dept. at [phone] if you have any questions about these instructions.   Surgery Visitation Policy:   Patients undergoing a surgery or procedure may have two family members or support persons with them as long as the person is not COVID-19 positive or experiencing  its symptoms.

## 2021-07-11 ENCOUNTER — Encounter
Admission: RE | Admit: 2021-07-11 | Discharge: 2021-07-11 | Disposition: A | Discharge status: Home / Self Care | Payer: Medicare HMO | Source: Ambulatory Visit | Attending: Otolaryngology | Admitting: Otolaryngology

## 2021-07-11 VITALS — Ht 67.0 in | Wt 255.0 lb

## 2021-07-11 DIAGNOSIS — Z01812 Encounter for preprocedural laboratory examination: Secondary | ICD-10-CM

## 2021-07-11 DIAGNOSIS — I519 Heart disease, unspecified: Secondary | ICD-10-CM

## 2021-07-11 DIAGNOSIS — I10 Essential (primary) hypertension: Secondary | ICD-10-CM

## 2021-07-11 DIAGNOSIS — I420 Dilated cardiomyopathy: Secondary | ICD-10-CM

## 2021-07-11 HISTORY — DX: Mixed hyperlipidemia: E78.2

## 2021-07-11 HISTORY — DX: Dilated cardiomyopathy: I42.0

## 2021-07-11 HISTORY — DX: Heart disease, unspecified: I51.9

## 2021-07-12 ENCOUNTER — Encounter
Admission: RE | Admit: 2021-07-12 | Discharge: 2021-07-12 | Disposition: A | Discharge status: Home / Self Care | Payer: Medicare HMO | Source: Ambulatory Visit | Attending: Otolaryngology | Admitting: Otolaryngology

## 2021-07-12 DIAGNOSIS — I420 Dilated cardiomyopathy: Secondary | ICD-10-CM | POA: Insufficient documentation

## 2021-07-12 DIAGNOSIS — Z01812 Encounter for preprocedural laboratory examination: Secondary | ICD-10-CM | POA: Insufficient documentation

## 2021-07-12 DIAGNOSIS — I519 Heart disease, unspecified: Secondary | ICD-10-CM | POA: Insufficient documentation

## 2021-07-12 DIAGNOSIS — I10 Essential (primary) hypertension: Secondary | ICD-10-CM | POA: Insufficient documentation

## 2021-07-12 LAB — CBC
HCT: 43.6 % (ref 36.0–46.0)
Hemoglobin: 14 g/dL (ref 12.0–15.0)
MCH: 30.5 pg (ref 26.0–34.0)
MCHC: 32.1 g/dL (ref 30.0–36.0)
MCV: 95 fL (ref 80.0–100.0)
Platelets: 202 10*3/uL (ref 150–400)
RBC: 4.59 MIL/uL (ref 3.87–5.11)
RDW: 13.9 % (ref 11.5–15.5)
WBC: 8.2 10*3/uL (ref 4.0–10.5)
nRBC: 0 % (ref 0.0–0.2)

## 2021-07-12 LAB — POTASSIUM: Potassium: 3.6 mmol/L (ref 3.5–5.1)

## 2021-07-14 DIAGNOSIS — E782 Mixed hyperlipidemia: Secondary | ICD-10-CM | POA: Diagnosis not present

## 2021-07-14 DIAGNOSIS — E890 Postprocedural hypothyroidism: Secondary | ICD-10-CM | POA: Diagnosis not present

## 2021-07-14 DIAGNOSIS — I10 Essential (primary) hypertension: Secondary | ICD-10-CM | POA: Diagnosis not present

## 2021-07-17 ENCOUNTER — Encounter: Payer: Self-pay | Admitting: Internal Medicine

## 2021-07-17 ENCOUNTER — Encounter: Payer: Self-pay | Admitting: Otolaryngology

## 2021-07-17 NOTE — Progress Notes (Signed)
PERIOPERATIVE PRESCRIPTION FOR IMPLANTED CARDIAC DEVICE PROGRAMMING  Patient Information: Name:  Lisa Fuentes  DOB:  1960-11-28  MRN:  [id number]    1. IMAGE GUIDED SINUS SURGERY  2. MAXILLARY ANTROSTOMY WITH REMOVAL TISSUE  3. TOTAL ETHMOIDECTOMY WITH FRONTAL SINUS EXPLORATION  4. SEPTOPLASTY  5. INFERIOR TURBINATE REDUCTION   Surgeon:  Dr. Margaretha Sheffield, MD  Requesting device clearance: Honor Loh, FNP-C  Date of Procedure:  07/19/2021  Cautery will be used.  Device Information:  Clinic EP Physician:  Virl Axe, MD   Device Type:  Defibrillator Manufacturer and Phone #:  Medtronic: [id number] Pacemaker Dependent?:  No. Date of Last Device Check:  06/19/2021 Normal Device Function?:  Yes.    Electrophysiologist's Recommendations:  Have magnet available. Provide continuous ECG monitoring when magnet is used or reprogramming is to be performed.  Procedure may interfere with device function.  Magnet should be placed over device during procedure.  Per Device Clinic Standing Orders, Wanda Plump, RN  9:29 AM 07/17/2021

## 2021-07-17 NOTE — Progress Notes (Incomplete)
Perioperative Services  Pre-Admission/Anesthesia Testing Clinical Review  Date: 07/18/21  Patient Demographics:  Name: Lisa Fuentes DOB:   29-Oct-1960 MRN:   [id number]  Planned Surgical Procedure(s):    Case: [id number] Date/Time: 07/19/21 0920   Procedures:      IMAGE GUIDED SINUS SURGERY     MAXILLARY ANTROSTOMY WITH REMOVAL TISSUE (Right)     TOTAL ETHMOIDECTOMY WITH FRONTAL SINUS EXPLORATION (Right)     SEPTOPLASTY     INFERIOR TURBINATE REDUCTION (Bilateral)   Anesthesia type: General   Pre-op diagnosis: DEVIATED NASAL SEPTUM WITH INFERIOR TURBINATE HYPERTROPHY, CHRONIC SINUSITIS   Location: [facility] OR ROOM 09 / [facility]   Surgeons: Margaretha Sheffield, MD   NOTE: Available PAT nursing documentation and vital signs have been reviewed. Clinical nursing staff has updated patient's PMH/PSHx, current medication list, and drug allergies/intolerances to ensure comprehensive history available to assist in medical decision making as it pertains to the aforementioned surgical procedure and anticipated anesthetic course. Extensive review of available clinical information performed. [place] PMH and PSHx updated with any diagnoses/procedures that  may have been inadvertently omitted during her intake with the pre-admission testing department's nursing staff.  Clinical Discussion:  Lisa Fuentes is a 61 y.o. female who is submitted for pre-surgical anesthesia review and clearance prior to her undergoing the above procedure. Patient has never been a smoker. Pertinent PMH includes: HFrEF, nonischemic dilated cardiomyopathy (s/p AICD placement), aortic dilatation, NSVT, tachycardia, HTN, HLD, hypothyroidism, DOE, OSAH (requires nocturnal PAP therapy), GERD (on daily PPI), anemia, OA, anxiety, panic attacks, depression.  Patient is followed by cardiology Fletcher Anon, MD). She was last seen in the cardiology clinic on 04/28/2021; notes reviewed.  At the clinic visit, patient reported to  be doing reasonably well from a cardiovascular perspective.  She denied any episodes of chest pain, shortness breath, PND, orthopnea, palpitations, significant peripheral edema, vertiginous symptoms, or presyncope/syncope.  Patient with a past medical history significant for cardiovascular diagnoses.  Patient underwent diagnostic left heart catheterization in 2003 revealing normal coronary arteries with reduced ejection fraction.  Patient with a history of a nonischemic dilated cardiomyopathy.  She underwent Medtronic AICD placement in 2008.  Device replaced and 2015.  Coronary CTA performed on 05/25/2019 revealing normal coronary origin with RIGHT dominance.  Coronary calcium score 0.  Myocardial perfusion imaging study performed on 03/21/2020 revealing a moderately reduced left ventricular systolic function with an EF of 30-44%.  There was a large perfusion defect in the mid anterior, apical anterior, and apex location consistent with ischemia in the LAD distribution.  There was no evidence of stress-induced arrhythmias.  Study determined to be intermediate risk.  Cardiology with plans for further evaluation.  Last TTE performed on 03/21/2020 revealing a mildly decreased left ventricular systolic function with an EF of 40-45%. Diastolic Doppler parameters consistent with abnormal relaxation (G1DD). There is no evidence of significant valvular regurgitation or transvalvular gradient suggestive of stenosis.  Aortic root mildly dilated at 37 mm.  Repeat diagnostic left heart catheterization performed on 03/28/2021 revealing normal coronary arteries.  Left ventricular angiography was not performed.  EF noted to be mildly reduced by echocardiogram.  LVEDP mildly elevated at 20 mmHg.  In the absence of significant obstructive CAD, it was determined that previously performed nuclear stress test noted a false positive result.  Recommendations were for continued medical therapy for nonischemic  cardiomyopathy.  Long-term cardiac event monitor study performed on 03/30/2020 revealing a predominant underlying sinus rhythm with an average heart rate  85 bpm; range 53-184 bpm.  There was possible atrial and ventricular pacing present.  2 episodes of SVT occurred, with the fastest interval lasting 4 beats at a maximum rate of 184 bpm, and the longest lasting 4 beats with an average heart rate of 113 bpm.  There was a rare atrial and ventricular ectopy noted.  Blood pressure well controlled at 108/78 on currently prescribed diuretic, ARB, and beta-blocker therapies.  Patient not currently taking any type of lipid-lowering therapies for her HLD diagnosis and further ASCVD prevention.  She is not diabetic.  Of note, patient is on an SGLT2i (empagliflozin) for her HFrEF diagnosis.  Again, patient has a Medtronic AICD in place that is regularly interrogated by her primary cardiology team.  Patient has OSAH diagnosis and is compliant with prescribed nocturnal PAP therapy.  Functional capacity, as defined by DASI, is documented as being >/= 4 METS.  No changes were made to her medication regimen.  Patient follow-up with outpatient cardiology in 6 months or sooner if needed.  Lisa Fuentes is scheduled for an IMAGE GUIDED SINUS SURGERY; RIGHT MAXILLARY ANTROSTOMY WITH REMOVAL TISSUE; RIGHT TOTAL ETHMOIDECTOMY WITH FRONTAL SINUS EXPLORATION; SEPTOPLASTY; BILATERAL INFERIOR TURBINATE REDUCTION on 07/19/2021 with Dr. Margaretha Sheffield, MD. Given patient's past medical history significant for cardiovascular diagnoses, presurgical cardiac clearance was sought by the PAT team.  Per cardiology, "patient's most recent EF 40-45%.  No recent episodes of VT.  Device with normal function, and she is not dependent.  No signs and symptoms of ischemia or DOE.  Patient may proceed without further cardiovascular work-up at an overall ACCEPTABLE risk".  In review of her medication reconciliation, it is noted the patient is on daily  antiplatelet therapy. She has been instructed on recommendations from her cardiologist for holding her daily ASA for 7 days prior to her procedure with plans to restart as soon as postoperative bleeding risk felt to be minimized by her primary attending surgeon. The patient is aware that her last dose of ASA should be on 07/11/2021.  Patient denies previous perioperative complications with anesthesia in the past.  With that being said, patient has a documented history of being a (+) difficult intubation. In review of the available records, it is noted that patient underwent a general anesthetic course here at [facility] ([facility]) in 09/2020 without documented complications.      07/11/2021   10:24 AM 06/19/2021   10:53 AM 04/28/2021    3:26 PM  Vitals with BMI  Height '5\' 7"'  '5\' 7"'  '5\' 7"'   Weight 255 lbs 256 lbs 3 oz 251 lbs  BMI 39.93 51.89 84.2  Systolic  103 128  Diastolic  72 78  Pulse  57 66    Providers/Specialists:   NOTE: Primary physician provider listed below. Patient may have been seen by APP or partner within same practice.   PROVIDER ROLE / SPECIALTY LAST Irving Burton, MD Otolaryngology (Surgeon) 04/24/2021  [place]land Pink, MD Primary Care Provider 02/07/2021  [name], MD Cardiology 04/28/2021  Virl Axe, MD Electrophysiology 06/19/2021  Ephriam Jenkins, MD Rheumatology 04/06/2021   Allergies:  Sulfa antibiotics and Sulfamethoxazole-trimethoprim  Current Home Medications:   No current facility-administered medications for this encounter.    aspirin EC 81 MG tablet   celecoxib (CELEBREX) 100 MG capsule   cetirizine (ZYRTEC) 10 MG tablet   cyclobenzaprine (FLEXERIL) 10 MG tablet   esomeprazole (NEXIUM) 40 MG capsule   fluticasone (FLONASE) 50 MCG/ACT nasal spray  furosemide (LASIX) 20 MG tablet   gabapentin (NEURONTIN) 100 MG capsule   JARDIANCE 10 MG TABS tablet   levothyroxine (SYNTHROID) 125 MCG tablet   losartan  (COZAAR) 25 MG tablet   metoprolol succinate (TOPROL-XL) 50 MG 24 hr tablet   nystatin (MYCOSTATIN) powder   nystatin cream (MYCOSTATIN)   oxyCODONE-acetaminophen (PERCOCET/ROXICET) 5-325 MG tablet   sertraline (ZOLOFT) 100 MG tablet   spironolactone (ALDACTONE) 25 MG tablet   terbinafine (LAMISIL) 250 MG tablet   History:   Past Medical History:  Diagnosis Date   AICD (automatic cardioverter/defibrillator) present 2008   a.) Medtronic device placed 2008. b.) replaced 06/21/2013 (Medtronic Evera XT single-chamber ICD; model number [id number]).   Anemia    Anxiety    Aortic dilatation ([place]) 03/24/2019   a.) TTE 03/24/2019: Ao root measured 37 mm. b.) TTE 03/21/2020: Ao root measured 37 mm.   Arthritis    Depression    Difficult intubation    DOE (dyspnea on exertion)    Endometriosis of vagina 09/2013   Intra-operative findings of endometriosis implants on cervical stump   Essential hypertension    GERD (gastroesophageal reflux disease)    Headache    HFrEF (heart failure with reduced ejection fraction) ([place])    a.) TTE 02/18/2014: EF 40-45%; tiv AR, mild MR/TR/PR; G1DD. b.) TTE 02/22/2017: EF 45-50%; mild LVH; triv PR; G1DD. c.) TTE 03/24/2019: EF 45-50%; glob HK, mild LVH; triv MR/TR/PR. d.) TTE 03/21/2020: EF 40-45%; glob HK.   History of 2019 novel coronavirus disease (COVID-19) 02/02/2020   History of 2019 novel coronavirus disease (COVID-19) 02/02/2020   Hypokalemia    Hypothyroidism    a.) s/p radioactive iodine Tx; on levothyroxine   Mixed hyperlipidemia    Nonischemic cardiomyopathy ([place])    a.) TTE 02/18/2014: EF 40-45%. b.) TTE 02/22/2017: EF 45-50%. c.) TTE 03/24/2019: EF 45-50%. d.) TTE 03/21/2020: EF 40-45%   OSA on CPAP    Panic attacks    PSVT (paroxysmal supraventricular tachycardia) ([place]) 03/30/2020   a.) Holter 03/30/2020 --> 2 runs; fastest lasting 4 beats (184 bpm); longest lasting 4 beats (113 bpm)   Tachycardia    Past Surgical History:  Procedure  Laterality Date   ANTERIOR AND POSTERIOR REPAIR N/A 11/25/2017   Procedure: ANTERIOR (CYSTOCELE) AND POSTERIOR REPAIR (RECTOCELE);  Surgeon: Rubie Maid, MD;  Location: [facility] ORS;  Service: Gynecology;  Laterality: N/A;   BREAST CYST ASPIRATION Left 03/22/2014   neg/ done by Dr Jamal Collin   CARDIAC CATHETERIZATION  2003   [place]: No significant coronary artery disease with reduced ejection fraction.   CARDIAC DEFIBRILLATOR PLACEMENT  04/2006   replaced 05/2013   CARDIAC DEFIBRILLATOR PLACEMENT  06/21/2013   Procedure:  CARDIAC DEFIBRILLATOR PLACEMENT (Medtronic single-chamber ICD, model number Evalyn Casco, model number [id number]): Location: [facility]; Surgeon: Mylinda Latina, MD   CARPAL TUNNEL RELEASE Right    COLONOSCOPY  2015   COLONOSCOPY N/A 10/13/2020   Procedure: COLONOSCOPY;  Surgeon: Annamaria Helling, DO;  Location: [facility] ENDOSCOPY;  Service: Gastroenterology;  Laterality: N/A;   COLONOSCOPY WITH PROPOFOL N/A 10/04/2015   Procedure: COLONOSCOPY WITH PROPOFOL;  Surgeon: Lucilla Lame, MD;  Location: [facility] ENDOSCOPY;  Service: Endoscopy;  Laterality: N/A;   CORONARY ANGIOPLASTY     CYSTOSCOPY  11/15/2014   Procedure: CYSTOSCOPY;  Surgeon: Rubie Maid, MD;  Location: [facility] ORS;  Service: Gynecology;;   DIAGNOSTIC LAPAROSCOPY     FOOT SURGERY Bilateral    heer spur and bunion   KNEE ARTHROSCOPY Left 03/02/2015  Procedure: ARTHROSCOPY  LEFT KNEE, PARTIAL LATERAL  MENISECTOMY, SYNOVECTOMY, MEDIAL & LATERAL CHONDROPLASTY;  Surgeon: Thornton Park, MD;  Location: [facility] ORS;  Service: Orthopedics;  Laterality: Left;   LAPAROSCOPIC SALPINGO OOPHERECTOMY Left 11/15/2014   Procedure: LAPAROSCOPIC OOPHORECTOMY;  Surgeon: Rubie Maid, MD;  Location: [facility] ORS;  Service: Gynecology;  Laterality: Left;   LAPAROSCOPIC SALPINGOOPHERECTOMY Right 09/2013   also with left salpingectomy   LEFT HEART CATH AND CORONARY ANGIOGRAPHY N/A 03/28/2020   Procedure: LEFT HEART CATH AND CORONARY ANGIOGRAPHY;   Surgeon: Wellington Hampshire, MD;  Location: [facility] [facility];  Service: Cardiovascular;  Laterality: N/A;   MINOR HEMORRHOIDECTOMY     TONSILLECTOMY     TOTAL ABDOMINAL HYSTERECTOMY     TRACHELECTOMY N/A 11/15/2014   Procedure: TRACHELECTOMY;  Surgeon: Rubie Maid, MD;  Location: [facility] ORS;  Service: Gynecology;  Laterality: N/A;   TRIGGER FINGER RELEASE Right    TUBAL LIGATION Bilateral    Family History  Problem Relation Age of Onset   Hypertension Mother    Hyperlipidemia Mother    Stroke Mother    Colon cancer Mother    Breast cancer Sister 52   Ovarian cancer Sister    Throat cancer Brother    Social History   Tobacco Use   Smoking status: Never   Smokeless tobacco: Never  Vaping Use   Vaping Use: Never used  Substance Use Topics   Alcohol use: Yes    Alcohol/week: 0.0 standard drinks of alcohol    Comment: occassionally   Drug use: No    Pertinent Clinical Results:  LABS: Labs reviewed: Acceptable for surgery.  Lab Results  Component Value Date   WBC 8.2 07/12/2021   HGB 14.0 07/12/2021   HCT 43.6 07/12/2021   MCV 95.0 07/12/2021   PLT 202 07/12/2021   Lab Results  Component Value Date   NA 143 05/15/2021   K 3.6 07/12/2021   CO2 21 05/15/2021   GLUCOSE 127 (H) 05/15/2021   BUN 17 05/15/2021   CREATININE 0.84 05/15/2021   CALCIUM 9.7 05/15/2021   EGFR 80 05/15/2021   GFRNONAA 57 (L) 04/28/2021    ECG: Date: 04/28/2021 Time ECG obtained: 1538 PM Rate: 66 bpm Rhythm: normal sinus with LVH Axis (leads I and aVF): Normal Intervals: PR 146 ms. QRS 80 ms. QTc 467 ms. ST segment and T wave changes: No evidence of acute ST segment elevation or depression Comparison: Similar to previous tracing obtained on 07/26/2020   IMAGING / PROCEDURES: LONG TERM CARDIAC EVENT MONITOR STUDY performed on 03/30/2020 Predominant underlying sinus rhythm with possible atrial and ventricular pacing present Average heart rate 85 bpm; range 53-184 bpm 2 runs of SVT  noted with the fastest lasting 4 beats at a maximal heart rate of 184 bpm and the longest lasting 4 beats an average rate of 113 bpm Rare PACs and PVCs Most triggered events did not correlate with arrhythmia other than sinus tachycardia  LEFT HEART CATHETERIZATION performed on 03/28/2020 Normal coronary arteries Left ventricular angiography was not performed.  EF was mildly reduced by echo. Mildly elevated left ventricular end-diastolic pressure of 20 mmHg False positive nuclear stress test.  Recommended continue medical therapy for nonischemic cardiomyopathy.  TRANSTHORACIC ECHOCARDIOGRAM performed on 03/21/2020 Left ventricular ejection fraction, by estimation, is 40 to 45%. The left ventricle has mildly decreased function. Left ventricular endocardial border not optimally defined to evaluate regional wall motion. Left ventricular diastolic parameters are consistent with Grade I diastolic dysfunction (impaired relaxation). Right ventricular systolic  function is normal. The right ventricular size is normal.  The mitral valve is normal in structure. No evidence of mitral valve regurgitation. No evidence of mitral stenosis.  The aortic valve is normal in structure. Aortic valve regurgitation is not visualized. No aortic stenosis is present.  The inferior vena cava is normal in size with greater than 50% respiratory variability, suggesting right atrial pressure of 3 mmHg.  MYOCARDIAL PERFUSION IMAGING STUDY (LEXISCAN) performed on 03/21/2020 Severely to moderately reduced left ventricular systolic function with an EF of 30-44% Large defect of moderate severity present in the mid anterior, apical anterior, and apex location.  Findings consistent with ischemia in the LAD distribution. No evidence of stress-induced arrhythmia observed Intermediate risk study.  CT CORONARY MORPH W/CTA COR W/SCORE W/CA W/CM &/OR WO/CM performed on 05/25/2019 No evidence of CAD; CAD RADS = 0 Coronary calcium score  of 0 Normal coronary origin with RIGHT dominance  Impression and Plan:  Lisa Fuentes has been referred for pre-anesthesia review and clearance prior to her undergoing the planned anesthetic and procedural courses. Available labs, pertinent testing, and imaging results were personally reviewed by me. This patient has been appropriately cleared by cardiology with an overall ACCEPTABLE risk of significant perioperative cardiovascular complications. Completed perioperative prescription for cardiac device management documentation completed by primary cardiology team and placed on patient's chart for review by the surgical/anesthetic team on the day of her procedure.   Based on clinical review performed today (07/18/21), barring any significant acute changes in the patient's overall condition, it is anticipated that she will be able to proceed with the planned surgical intervention. Any acute changes in clinical condition may necessitate her procedure being postponed and/or cancelled. Patient will meet with anesthesia team (MD and/or CRNA) on the day of her procedure for preoperative evaluation/assessment. Questions regarding anesthetic course will be fielded at that time.   Pre-surgical instructions were reviewed with the patient during her PAT appointment and questions were fielded by PAT clinical staff. Patient was advised that if any questions or concerns arise prior to her procedure then she should return a call to PAT and/or her surgeon's office to discuss.  Honor Loh, MSN, APRN, FNP-C, CEN [facility]  Peri-operative Services Nurse Practitioner Phone: [phone] Fax: [phone] 07/18/21 8:44 AM  NOTE: This note has been prepared using Dragon dictation software. Despite my best ability to proofread, there is always the potential that unintentional transcriptional errors may still occur from this process.

## 2021-07-18 ENCOUNTER — Encounter: Payer: Self-pay | Admitting: Otolaryngology

## 2021-07-18 MED ORDER — CEFAZOLIN SODIUM-DEXTROSE 2-4 GM/100ML-% IV SOLN
2.0000 g | Freq: Once | INTRAVENOUS | Status: AC
  Administered 2021-07-19: 2 g via INTRAVENOUS

## 2021-07-18 MED ORDER — OXYMETAZOLINE HCL 0.05 % NA SOLN: 2.0000 | Freq: Once | NASAL | Status: AC

## 2021-07-18 MED ORDER — ORAL CARE MOUTH RINSE: 15.0000 mL | Freq: Once | OROMUCOSAL | Status: AC

## 2021-07-18 MED ORDER — CHLORHEXIDINE GLUCONATE 0.12 % MT SOLN: 15.0000 mL | Freq: Once | OROMUCOSAL | Status: AC

## 2021-07-18 MED ORDER — LACTATED RINGERS IV SOLN: INTRAVENOUS | Status: DC

## 2021-07-19 ENCOUNTER — Encounter: Payer: Self-pay | Admitting: Otolaryngology

## 2021-07-19 ENCOUNTER — Ambulatory Visit: Payer: Medicare HMO | Admitting: Urgent Care

## 2021-07-19 ENCOUNTER — Encounter
Admission: RE | Disposition: A | Discharge status: Home / Self Care | Payer: Self-pay | Source: Home / Self Care | Attending: Otolaryngology

## 2021-07-19 ENCOUNTER — Other Ambulatory Visit: Payer: Self-pay

## 2021-07-19 ENCOUNTER — Ambulatory Visit
Admission: RE | Admit: 2021-07-19 | Discharge: 2021-07-19 | Disposition: A | Discharge status: Home / Self Care | Payer: Medicare HMO | Attending: Otolaryngology | Admitting: Otolaryngology

## 2021-07-19 DIAGNOSIS — Z951 Presence of aortocoronary bypass graft: Secondary | ICD-10-CM | POA: Diagnosis not present

## 2021-07-19 DIAGNOSIS — F419 Anxiety disorder, unspecified: Secondary | ICD-10-CM | POA: Diagnosis not present

## 2021-07-19 DIAGNOSIS — E039 Hypothyroidism, unspecified: Secondary | ICD-10-CM | POA: Insufficient documentation

## 2021-07-19 DIAGNOSIS — K219 Gastro-esophageal reflux disease without esophagitis: Secondary | ICD-10-CM | POA: Insufficient documentation

## 2021-07-19 DIAGNOSIS — J320 Chronic maxillary sinusitis: Secondary | ICD-10-CM | POA: Diagnosis not present

## 2021-07-19 DIAGNOSIS — J343 Hypertrophy of nasal turbinates: Secondary | ICD-10-CM | POA: Insufficient documentation

## 2021-07-19 DIAGNOSIS — Z6837 Body mass index (BMI) 37.0-37.9, adult: Secondary | ICD-10-CM | POA: Diagnosis not present

## 2021-07-19 DIAGNOSIS — I10 Essential (primary) hypertension: Secondary | ICD-10-CM | POA: Insufficient documentation

## 2021-07-19 DIAGNOSIS — J342 Deviated nasal septum: Secondary | ICD-10-CM | POA: Insufficient documentation

## 2021-07-19 DIAGNOSIS — F32A Depression, unspecified: Secondary | ICD-10-CM | POA: Insufficient documentation

## 2021-07-19 DIAGNOSIS — J322 Chronic ethmoidal sinusitis: Secondary | ICD-10-CM | POA: Diagnosis not present

## 2021-07-19 DIAGNOSIS — J329 Chronic sinusitis, unspecified: Secondary | ICD-10-CM | POA: Diagnosis not present

## 2021-07-19 DIAGNOSIS — J321 Chronic frontal sinusitis: Secondary | ICD-10-CM | POA: Insufficient documentation

## 2021-07-19 HISTORY — DX: Unspecified systolic (congestive) heart failure: I50.20

## 2021-07-19 HISTORY — PX: ETHMOIDECTOMY: SHX5197

## 2021-07-19 HISTORY — DX: Other forms of dyspnea: R06.09

## 2021-07-19 HISTORY — PX: MAXILLARY ANTROSTOMY: SHX2003

## 2021-07-19 HISTORY — PX: NASAL TURBINATE REDUCTION: SHX2072

## 2021-07-19 HISTORY — DX: Panic disorder (episodic paroxysmal anxiety): F41.0

## 2021-07-19 HISTORY — PX: SEPTOPLASTY: SHX2393

## 2021-07-19 HISTORY — DX: Obstructive sleep apnea (adult) (pediatric): G47.33

## 2021-07-19 HISTORY — DX: Presence of automatic (implantable) cardiac defibrillator: Z95.810

## 2021-07-19 HISTORY — PX: IMAGE GUIDED SINUS SURGERY: SHX6570

## 2021-07-19 SURGERY — SINUS SURGERY, WITH IMAGING GUIDANCE
Anesthesia: General | Site: Nose | Laterality: Right

## 2021-07-19 MED ORDER — OXYCODONE HCL 5 MG/5ML PO SOLN: 5.0000 mg | Freq: Once | ORAL | Status: AC

## 2021-07-19 MED ORDER — REMIFENTANIL HCL 1 MG IV SOLR
INTRAVENOUS | Status: DC | PRN
  Administered 2021-07-19: .2 ug/kg/min via INTRAVENOUS

## 2021-07-19 MED ORDER — KETAMINE HCL 10 MG/ML IJ SOLN
INTRAMUSCULAR | Status: DC | PRN
  Administered 2021-07-19: 30 mg via INTRAVENOUS
  Administered 2021-07-19: 20 mg via INTRAVENOUS

## 2021-07-19 MED ORDER — OXYCODONE HCL 5 MG/5ML PO SOLN
ORAL | Status: AC
  Administered 2021-07-19: 5 mg via ORAL
  Filled 2021-07-19: qty 5

## 2021-07-19 MED ORDER — PROPOFOL 10 MG/ML IV BOLUS
INTRAVENOUS | Status: DC | PRN
  Administered 2021-07-19: 200 mg via INTRAVENOUS

## 2021-07-19 MED ORDER — ONDANSETRON HCL 4 MG/2ML IJ SOLN
INTRAMUSCULAR | Status: DC | PRN
  Administered 2021-07-19: 4 mg via INTRAVENOUS

## 2021-07-19 MED ORDER — PROPOFOL 1000 MG/100ML IV EMUL
INTRAVENOUS | Status: AC
  Filled 2021-07-19: qty 100

## 2021-07-19 MED ORDER — REMIFENTANIL HCL 1 MG IV SOLR
INTRAVENOUS | Status: AC
  Filled 2021-07-19: qty 1000

## 2021-07-19 MED ORDER — MIDAZOLAM HCL 2 MG/2ML IJ SOLN
INTRAMUSCULAR | Status: AC
  Filled 2021-07-19: qty 2

## 2021-07-19 MED ORDER — PREDNISONE 10 MG PO TABS: ORAL_TABLET | ORAL | 0 refills | Status: DC

## 2021-07-19 MED ORDER — DEXAMETHASONE SODIUM PHOSPHATE 10 MG/ML IJ SOLN
INTRAMUSCULAR | Status: DC | PRN
  Administered 2021-07-19: 10 mg via INTRAVENOUS

## 2021-07-19 MED ORDER — PROPOFOL 1000 MG/100ML IV EMUL
INTRAVENOUS | Status: AC
  Filled 2021-07-19: qty 200

## 2021-07-19 MED ORDER — ACETAMINOPHEN 10 MG/ML IV SOLN: 1000.0000 mg | Freq: Once | INTRAVENOUS | Status: AC

## 2021-07-19 MED ORDER — 0.9 % SODIUM CHLORIDE (POUR BTL) OPTIME
TOPICAL | Status: DC | PRN
  Administered 2021-07-19: 500 mL

## 2021-07-19 MED ORDER — PHENYLEPHRINE HCL-NACL 20-0.9 MG/250ML-% IV SOLN
INTRAVENOUS | Status: DC | PRN
  Administered 2021-07-19: 20 ug/min via INTRAVENOUS

## 2021-07-19 MED ORDER — OXYMETAZOLINE HCL 0.05 % NA SOLN
NASAL | Status: AC
  Filled 2021-07-19: qty 30

## 2021-07-19 MED ORDER — SUGAMMADEX SODIUM 200 MG/2ML IV SOLN
INTRAVENOUS | Status: DC | PRN
  Administered 2021-07-19: 200 mg via INTRAVENOUS

## 2021-07-19 MED ORDER — LIDOCAINE-EPINEPHRINE (PF) 1 %-1:200000 IJ SOLN
INTRAMUSCULAR | Status: DC | PRN
  Administered 2021-07-19: 6 mL
  Administered 2021-07-19: 2 mL

## 2021-07-19 MED ORDER — LIDOCAINE HCL (PF) 4 % IJ SOLN
INTRAMUSCULAR | Status: AC
  Filled 2021-07-19: qty 20

## 2021-07-19 MED ORDER — IPRATROPIUM-ALBUTEROL 0.5-2.5 (3) MG/3ML IN SOLN: 3.0000 mL | Freq: Once | RESPIRATORY_TRACT | Status: AC

## 2021-07-19 MED ORDER — GLYCOPYRROLATE 0.2 MG/ML IJ SOLN
INTRAMUSCULAR | Status: AC
  Filled 2021-07-19: qty 1

## 2021-07-19 MED ORDER — CEPHALEXIN 500 MG PO CAPS: 500.0000 mg | ORAL_CAPSULE | Freq: Two times a day (BID) | ORAL | 0 refills | Status: DC

## 2021-07-19 MED ORDER — PHENYLEPHRINE HCL-NACL 20-0.9 MG/250ML-% IV SOLN
INTRAVENOUS | Status: AC
  Filled 2021-07-19: qty 250

## 2021-07-19 MED ORDER — EPHEDRINE SULFATE (PRESSORS) 50 MG/ML IJ SOLN
INTRAMUSCULAR | Status: DC | PRN
  Administered 2021-07-19 (×2): 5 mg via INTRAVENOUS

## 2021-07-19 MED ORDER — IPRATROPIUM-ALBUTEROL 0.5-2.5 (3) MG/3ML IN SOLN
RESPIRATORY_TRACT | Status: AC
  Administered 2021-07-19: 3 mL via RESPIRATORY_TRACT
  Filled 2021-07-19: qty 3

## 2021-07-19 MED ORDER — KETAMINE HCL 50 MG/5ML IJ SOSY
PREFILLED_SYRINGE | INTRAMUSCULAR | Status: AC
  Filled 2021-07-19: qty 5

## 2021-07-19 MED ORDER — PROMETHAZINE HCL 25 MG/ML IJ SOLN: 6.2500 mg | INTRAMUSCULAR | Status: DC | PRN

## 2021-07-19 MED ORDER — ROCURONIUM BROMIDE 100 MG/10ML IV SOLN
INTRAVENOUS | Status: DC | PRN
  Administered 2021-07-19: 50 mg via INTRAVENOUS

## 2021-07-19 MED ORDER — CEFAZOLIN SODIUM-DEXTROSE 2-4 GM/100ML-% IV SOLN
INTRAVENOUS | Status: AC
  Filled 2021-07-19: qty 100

## 2021-07-19 MED ORDER — PHENYLEPHRINE HCL (PRESSORS) 10 MG/ML IV SOLN
INTRAVENOUS | Status: AC
  Filled 2021-07-19: qty 1

## 2021-07-19 MED ORDER — ACETAMINOPHEN 10 MG/ML IV SOLN
INTRAVENOUS | Status: AC
  Administered 2021-07-19: 1000 mg via INTRAVENOUS
  Filled 2021-07-19: qty 100

## 2021-07-19 MED ORDER — FENTANYL CITRATE (PF) 100 MCG/2ML IJ SOLN: 25.0000 ug | INTRAMUSCULAR | Status: DC | PRN

## 2021-07-19 MED ORDER — LIDOCAINE-EPINEPHRINE 1 %-1:100000 IJ SOLN
INTRAMUSCULAR | Status: AC
  Filled 2021-07-19: qty 1

## 2021-07-19 MED ORDER — CHLORHEXIDINE GLUCONATE 0.12 % MT SOLN
OROMUCOSAL | Status: AC
  Administered 2021-07-19: 15 mL via OROMUCOSAL
  Filled 2021-07-19: qty 15

## 2021-07-19 MED ORDER — FENTANYL CITRATE (PF) 100 MCG/2ML IJ SOLN
INTRAMUSCULAR | Status: DC | PRN
  Administered 2021-07-19 (×2): 50 ug via INTRAVENOUS

## 2021-07-19 MED ORDER — PHENYLEPHRINE 80 MCG/ML (10ML) SYRINGE FOR IV PUSH (FOR BLOOD PRESSURE SUPPORT)
PREFILLED_SYRINGE | INTRAVENOUS | Status: DC | PRN
  Administered 2021-07-19: 160 ug via INTRAVENOUS
  Administered 2021-07-19: 80 ug via INTRAVENOUS
  Administered 2021-07-19 (×2): 160 ug via INTRAVENOUS

## 2021-07-19 MED ORDER — GLYCOPYRROLATE 0.2 MG/ML IJ SOLN
INTRAMUSCULAR | Status: DC | PRN
  Administered 2021-07-19: .2 mg via INTRAVENOUS

## 2021-07-19 MED ORDER — OXYMETAZOLINE HCL 0.05 % NA SOLN
NASAL | Status: AC
  Administered 2021-07-19: 2 via NASAL
  Filled 2021-07-19: qty 30

## 2021-07-19 MED ORDER — LIDOCAINE HCL (CARDIAC) PF 100 MG/5ML IV SOSY
PREFILLED_SYRINGE | INTRAVENOUS | Status: DC | PRN
  Administered 2021-07-19: 100 mg via INTRAVENOUS

## 2021-07-19 MED ORDER — OXYMETAZOLINE HCL 0.05 % NA SOLN
NASAL | Status: DC | PRN
  Administered 2021-07-19: 1 via TOPICAL

## 2021-07-19 MED ORDER — PHENYLEPHRINE HCL (PRESSORS) 10 MG/ML IV SOLN
INTRAVENOUS | Status: DC | PRN
  Administered 2021-07-19: 21 mL via NASAL

## 2021-07-19 MED ORDER — PROPOFOL 500 MG/50ML IV EMUL
INTRAVENOUS | Status: DC | PRN
  Administered 2021-07-19: 150 ug/kg/min via INTRAVENOUS

## 2021-07-19 MED ORDER — MIDAZOLAM HCL 2 MG/2ML IJ SOLN
INTRAMUSCULAR | Status: DC | PRN
  Administered 2021-07-19: 2 mg via INTRAVENOUS

## 2021-07-19 MED ORDER — FENTANYL CITRATE (PF) 100 MCG/2ML IJ SOLN
INTRAMUSCULAR | Status: AC
  Filled 2021-07-19: qty 2

## 2021-07-19 MED ORDER — REMIFENTANIL BOLUS VIA INFUSION OPTIME: INTRAVENOUS | Status: DC | PRN

## 2021-07-19 SURGICAL SUPPLY — 41 items
BATTERY INSTRU NAVIGATION (MISCELLANEOUS) ×8 IMPLANT
BLADE SURG 15 STRL LF DISP TIS (BLADE) ×3 IMPLANT
BLADE SURG 15 STRL SS (BLADE) ×1
CNTNR SPEC 2.5X3XGRAD LEK (MISCELLANEOUS) ×6
COAG SUCT 10F 3.5MM HAND CTRL (MISCELLANEOUS) ×4 IMPLANT
CONT SPEC 4OZ STER OR WHT (MISCELLANEOUS) ×2
CONTAINER SPEC 2.5X3XGRAD LEK (MISCELLANEOUS) ×6 IMPLANT
DEFOGGER ANTIFOG KIT (MISCELLANEOUS) ×4 IMPLANT
DRESSING NASL FOAM PST OP SINU (MISCELLANEOUS) IMPLANT
DRSG NASAL FOAM POST OP SINU (MISCELLANEOUS)
ELECT REM PT RETURN 9FT ADLT (ELECTROSURGICAL) ×4
ELECTRODE REM PT RTRN 9FT ADLT (ELECTROSURGICAL) ×3 IMPLANT
GAUZE 4X4 16PLY ~~LOC~~+RFID DBL (SPONGE) ×4 IMPLANT
GLOVE PROTEXIS LATEX SZ 7.5 (GLOVE) ×8 IMPLANT
GLOVE SURG LATEX 7.5 PF (GLOVE) ×6 IMPLANT
GOWN STRL REUS W/ TWL LRG LVL3 (GOWN DISPOSABLE) ×6 IMPLANT
GOWN STRL REUS W/TWL LRG LVL3 (GOWN DISPOSABLE) ×2
IV NS 500ML (IV SOLUTION) ×1
IV NS 500ML BAXH (IV SOLUTION) IMPLANT
MANIFOLD NEPTUNE II (INSTRUMENTS) ×4 IMPLANT
NDL ANESTHESIA 27G X 3.5 (NEEDLE) ×3 IMPLANT
NEEDLE ANESTHESIA  27G X 3.5 (NEEDLE) ×1
NEEDLE ANESTHESIA 27G X 3.5 (NEEDLE) ×3 IMPLANT
NS IRRIG 500ML POUR BTL (IV SOLUTION) ×4 IMPLANT
PACK HEAD/NECK (MISCELLANEOUS) ×4 IMPLANT
PACKING NASAL EPIS 4X2.4 XEROG (MISCELLANEOUS) ×4 IMPLANT
PATTIES SURGICAL .5 X3 (DISPOSABLE) ×4 IMPLANT
SHAVER DIEGO BLD STD TYPE A (BLADE) ×4 IMPLANT
SPLINT NASAL REUTER .5MM BIVLV (MISCELLANEOUS) ×1 IMPLANT
SUT CHROMIC 3-0 (SUTURE) ×1
SUT CHROMIC 3-0 KS 27XMFL CR (SUTURE) ×3
SUT ETHILON 3-0 KS 30 BLK (SUTURE) ×4 IMPLANT
SUT PLAIN GUT 4-0 (SUTURE) ×4 IMPLANT
SUTURE CHRMC 3-0 KS 27XMFL CR (SUTURE) ×3 IMPLANT
SWAB CULTURE AMIES ANAERIB BLU (MISCELLANEOUS) ×4 IMPLANT
SYR 20ML LL LF (SYRINGE) ×4 IMPLANT
SYR 3ML LL SCALE MARK (SYRINGE) ×4 IMPLANT
TRACKER CRANIALMASK (MASK) ×4 IMPLANT
TUBING CONNECTING 10 (TUBING) ×1 IMPLANT
TUBING DECLOG MULTIDEBRIDER (TUBING) ×4 IMPLANT
WATER STERILE IRR 1000ML POUR (IV SOLUTION) IMPLANT

## 2021-07-19 NOTE — Discharge Instructions (Signed)
AMBULATORY SURGERY  ?DISCHARGE INSTRUCTIONS ? ? ?The drugs that you were given will stay in your system until tomorrow so for the next 24 hours you should not: ? ?Drive an automobile ?Make any legal decisions ?Drink any alcoholic beverage ? ? ?You may resume regular meals tomorrow.  Today it is better to start with liquids and gradually work up to solid foods. ? ?You may eat anything you prefer, but it is better to start with liquids, then soup and crackers, and gradually work up to solid foods. ? ? ?Please notify your doctor immediately if you have any unusual bleeding, trouble breathing, redness and pain at the surgery site, drainage, fever, or pain not relieved by medication. ? ? ? ?Additional Instructions: ? ? ? ?Please contact your physician with any problems or Same Day Surgery at [phone], Monday through Friday 6 am to 4 pm, or [place] [facility] [place] Main number at [phone].  ?

## 2021-07-19 NOTE — H&P (Signed)
H&P has been reviewed and patient reevaluated, no changes necessary. To be downloaded later.  

## 2021-07-19 NOTE — Anesthesia Procedure Notes (Signed)
Procedure Name: Intubation Date/Time: 07/19/2021 10:01 AM  Performed by: Esaw Grandchild, CRNAPre-anesthesia Checklist: Patient identified, Emergency Drugs available, Suction available and Patient being monitored Patient Re-evaluated:Patient Re-evaluated prior to induction Oxygen Delivery Method: Circle system utilized Preoxygenation: Pre-oxygenation with 100% oxygen Induction Type: IV induction Ventilation: Mask ventilation without difficulty Laryngoscope Size: Mac and 4 Grade View: Grade I Tube type: Oral Rae Tube size: 7.5 mm Number of attempts: 1 Airway Equipment and Method: Oral airway and Bite block Placement Confirmation: ETT inserted through vocal cords under direct vision, positive ETCO2 and breath sounds checked- equal and bilateral Secured at: 22 cm Tube secured with: Tape Dental Injury: Teeth and Oropharynx as per pre-operative assessment

## 2021-07-19 NOTE — Anesthesia Preprocedure Evaluation (Signed)
Anesthesia Evaluation  Patient identified by MRN, date of birth, ID band Patient awake    Reviewed: Allergy & Precautions, NPO status , Patient's Chart, lab work & pertinent test results  History of Anesthesia Complications Negative for: history of anesthetic complications  Airway Mallampati: III  TM Distance: >3 FB Neck ROM: full    Dental  (+) Partial Lower, Partial Upper, Dental Advidsory Given   Pulmonary neg shortness of breath, sleep apnea and Continuous Positive Airway Pressure Ventilation , neg COPD, neg recent URI,    Pulmonary exam normal        Cardiovascular hypertension, (-) angina(-) Past MI and (-) Cardiac Stents + dysrhythmias Supra Ventricular Tachycardia + Cardiac Defibrillator (-) Valvular Problems/Murmurs     Neuro/Psych  Headaches, neg Seizures PSYCHIATRIC DISORDERS Anxiety Depression negative psych ROS   GI/Hepatic Neg liver ROS, GERD  Medicated,  Endo/Other  neg diabetesHypothyroidism Morbid obesity  Renal/GU negative Renal ROS  negative genitourinary   Musculoskeletal   Abdominal (+) + obese,   Peds  Hematology  (+) Blood dyscrasia, anemia ,   Anesthesia Other Findings Past Medical History: No date: Anemia No date: Anxiety     Comment:  panic attacks No date: Arthritis No date: Clotting disorder (B and E) No date: Depression No date: Difficult intubation 09/2013: Endometriosis of vagina     Comment:  Intra-operative findings of endometriosis implants on               cervical stump No date: GERD (gastroesophageal reflux disease) No date: Headache No date: Hypercholesteremia No date: Hypertension No date: Hypokalemia No date: Hypothyroidism No date: Implantable defibrillator     Comment:  [employer] DOI 2008, replacement 2015 No date: Menopausal symptoms No date: Nonischemic cardiomyopathy (HCC)     Comment:  Interval improvement EF 40-45% 1/16 No date: Shortness of breath dyspnea      Comment:  chronic doe No date: Sleep apnea     Comment:  Treated with CPAP No date: Tachycardia  Past Surgical History: 11/25/2017: ANTERIOR AND POSTERIOR REPAIR; N/A     Comment:  Procedure: ANTERIOR (CYSTOCELE) AND POSTERIOR REPAIR               (RECTOCELE);  Surgeon: Rubie Maid, MD;  Location: [facility]              ORS;  Service: Gynecology;  Laterality: N/A; 03/22/2014: BREAST CYST ASPIRATION; Left     Comment:  neg/ done by Dr Jamal Collin 2003: CARDIAC CATHETERIZATION     Comment:  [facility]: No significant coronary artery disease with               reduced ejection fraction. No date: CARDIAC DEFIBRILLATOR PLACEMENT No date: CARPAL TUNNEL RELEASE; Right 2015: COLONOSCOPY 10/04/2015: COLONOSCOPY WITH PROPOFOL; N/A     Comment:  Procedure: COLONOSCOPY WITH PROPOFOL;  Surgeon: Lucilla Lame, MD;  Location: [facility] ENDOSCOPY;  Service: Endoscopy;              Laterality: N/A; No date: CORONARY ANGIOPLASTY No date: CORONARY ARTERY BYPASS GRAFT 11/15/2014: CYSTOSCOPY     Comment:  Procedure: CYSTOSCOPY;  Surgeon: Rubie Maid, MD;                Location: [facility] ORS;  Service: Gynecology;; No date: DIAGNOSTIC LAPAROSCOPY No date: FOOT SURGERY No date: HAND SURGERY 03/02/2015: KNEE ARTHROSCOPY; Left     Comment:  Procedure: ARTHROSCOPY  LEFT KNEE, PARTIAL LATERAL  MENISECTOMY, SYNOVECTOMY, MEDIAL & LATERAL CHONDROPLASTY;              Surgeon: Thornton Park, MD;  Location: [facility] ORS;                Service: Orthopedics;  Laterality: Left;  Dr. Mack Guise               wanted her surgery switched from [facility]               because of the Defibrillator.  patient has a               Defibrillator  takes Asprin/knows to stop 5days before                has had some test done 11/15/2014: LAPAROSCOPIC SALPINGO OOPHERECTOMY; Left     Comment:  Procedure: LAPAROSCOPIC OOPHORECTOMY;  Surgeon: Rubie Maid, MD;  Location: [facility] ORS;  Service:  Gynecology;                Laterality: Left; 09/2013: LAPAROSCOPIC SALPINGOOPHERECTOMY; Right     Comment:  also with left salpingectomy 03/28/2020: LEFT HEART CATH AND CORONARY ANGIOGRAPHY; N/A     Comment:  Procedure: LEFT HEART CATH AND CORONARY ANGIOGRAPHY;                Surgeon: Wellington Hampshire, MD;  Location: [place]               CV LAB;  Service: Cardiovascular;  Laterality: N/A; No date: TONSILLECTOMY No date: TOTAL ABDOMINAL HYSTERECTOMY 11/15/2014: TRACHELECTOMY; N/A     Comment:  Procedure: TRACHELECTOMY;  Surgeon: Rubie Maid, MD;                Location: [facility] ORS;  Service: Gynecology;  Laterality:               N/A; No date: TUBAL LIGATION; Bilateral  BMI    Body Mass Index: 37.90 kg/m      Reproductive/Obstetrics negative OB ROS                             Anesthesia Physical  Anesthesia Plan  ASA: 3  Anesthesia Plan: General   Post-op Pain Management:    Induction: Intravenous  PONV Risk Score and Plan: Ondansetron, Dexamethasone, Midazolam and Treatment may vary due to age or medical condition  Airway Management Planned: Oral ETT  Additional Equipment:   Intra-op Plan:   Post-operative Plan: Extubation in OR  Informed Consent: I have reviewed the patients History and Physical, chart, labs and discussed the procedure including the risks, benefits and alternatives for the proposed anesthesia with the patient or authorized representative who has indicated his/her understanding and acceptance.     Dental Advisory Given  Plan Discussed with: Anesthesiologist, CRNA and Surgeon  Anesthesia Plan Comments: (Patient consented for risks of anesthesia including but not limited to:  - adverse reactions to medications - risk of airway placement if required - damage to eyes, teeth, lips or other oral mucosa - nerve damage due to positioning  - sore throat or hoarseness - Damage to heart, brain, nerves, lungs, other parts  of body or loss of life  Patient voiced understanding.)        Anesthesia Quick Evaluation

## 2021-07-19 NOTE — Transfer of Care (Signed)
Immediate Anesthesia Transfer of Care Note  Patient: Lisa Fuentes  Procedure(s) Performed: IMAGE GUIDED SINUS SURGERY (Nose) MAXILLARY ANTROSTOMY WITH REMOVAL TISSUE (Right: Nose) TOTAL ETHMOIDECTOMY WITH FRONTAL SINUS EXPLORATION (Right: Nose) SEPTOPLASTY (Nose) INFERIOR TURBINATE REDUCTION (Bilateral: Nose)  Patient Location: PACU  Anesthesia Type:General  Level of Consciousness: awake, alert  and oriented  Airway & Oxygen Therapy: Patient Spontanous Breathing and Patient connected to face mask oxygen  Post-op Assessment: Report given to RN and Post -op Vital signs reviewed and stable  Post vital signs: Reviewed and stable  Last Vitals:  Vitals Value Taken Time  BP 124/80 07/19/21 1238  Temp    Pulse 70 07/19/21 1243  Resp 14 07/19/21 1243  SpO2 98 % 07/19/21 1243  Vitals shown include unvalidated device data.  Last Pain:  Vitals:   07/19/21 0748  TempSrc: Oral  PainSc: 0-No pain         Complications: No notable events documented.

## 2021-07-19 NOTE — Op Note (Signed)
07/19/2021  12:30 PM  [id number]   Pre-Op Dx:  Deviated Nasal Septum, Hypertrophic Inferior Turbinates, chronic right maxillary sinusitis, chronic right ethmoid sinusitis, chronic right frontal sinusitis.  Post-op Dx: Same  Proc: Nasal Septoplasty, Bilateral Partial Reduction Inferior Turbinates, right endoscopic maxillary antrostomy with removal of contents, right endoscopic total ethmoidectomy with frontal sinusotomy, use of image guided system  Surg:  Lisa Fuentes Lisa Fuentes  Anes:  GOT  EBL: 150 mL  Comp: None  Findings: Septum deviated to the right side with the right middle turbinate pinched into the middle meatus blocking the sinus openings.  Thickened mucous membranes in the right maxillary, ethmoid, and frontal sinuses.    Procedure: With the patient in a comfortable supine position,  general orotracheal anesthesia was induced without difficulty.     The patient received preoperative Afrin spray for topical decongestion and vasoconstriction.  Intravenous prophylactic antibiotics were administered.  The image guided system was brought in and the CT scan was downloaded to the disc.  The template was applied to the face and this was registered to the system.  There was 0.7 mm of variance.  The suction instruments were then registered the system and there was poor alignment.  The mask was replaced with a fresh mask and then this was reregistered again.  This time there was 0.9 mm variance but the suction instruments then showed correct alignment with the CT scan.  The patient was placed in a semi-sitting position.  Nasal vibrissae were trimmed.   1% Xylocaine with 1:100,000 epinephrine, 6 cc's, was infiltrated into the anterior floor of the nose, into the nasal spine region, into the membranous columella, and finally into the submucoperichondrial plane of the septum on both sides.  Several minutes were allowed for this to take effect.  Cottoniod pledgetts soaked in Afrin and 4% Xylocaine were  placed into both nasal cavities and left while the patient was prepped and draped in the standard fashion.  The materials were removed from the nose and observed to be intact and correct in number.  The nose was inspected with a headlight and zero degree scope with the findings as described above.  A left Killian incision was sharply executed and carried down to the quadrangular cartilage. The mucoperichondrium was elelvated along the quadrangular plate back to the bony-cartilaginous junction. The mucoperiostium was then elevated along the ethmoid plate and the vomer. The boney-catilaginous junction was then split with a freer elevator and the mucoperiosteum was elevated on the opposite side. The mucoperiosteum was then elevated along the maxillary crest as needed to expose the crooked bone of the crest.  Boney spurs of the vomer and maxillary crest were removed with Donavan Foil forceps.  The cartilaginous plate was trimmed along its posterior and inferior borders of about 2 mm of cartilage to free it up inferiorly. Some of the deviated ethmoid plate was then fractured and removed with Takahashi forceps to free up the posterior border of the quadrangular plate and allow it to swing back to the midline. The mucosal flaps were placed back into their anatomic position to allow visualization of the airways. The septum now sat in the midline with an improved airway.  A 3-0 Chromic suture on a Keith needle in used to anchor the inferior septum at the nasal spine with a through and through suture. The mucosal flaps are then sutured together using a through and through whip stitch of 4-0 [place] with a mini-Keith needle. This was used to close the [place] incision  as well.   The inferior turbinates were then inspected. An incision was created along the inferior aspect of the left inferior turbinate with removal of some of the inferior soft tissue and bone. Electrocautery was used to control bleeding in the area.  The remaining turbinate was then outfractured to open up the airway further. There was no significant bleeding noted. The right turbinate was then trimmed and outfractured in a similar fashion.  The 0 degree and 30 degree scopes were used for visualizing the nasal passages again.  The right side showed the middle turbinate to be very lateralized.  This was injected along its anterior and posterior borders with a small amount of 1% Xylocaine with epi 1-100,000 using a total of 2 mL in the turbinate.  The turbinate was infractured to visualize middle meatus more.  The uncinate process was incised with a side biter and then was removed with through biting forceps.  This showed the opening to the maxillary antrum.  The uncinates was trimmed more inferiorly and posteriorly to widen the maxillary antrum.  The 30 and 70 degree scopes were used to visualize the maxillary sinus.  There was a cyst at its base that was broken and fluid suctioned out.  The entire maxillary sinus was now open and clear.  The 30 degree scope was used to visualize the ethmoid.  The ethmoid bulla was opened and trimmed to open up into the posterior middle ethmoid air cells.  These were opened with the forceps and microdebrider.  The fovea ethmoidalis was found and was tracked forward cleaning off the debris from the ethmoid sinuses, leaving the mucous membrane intact.  The frontal sinus duct was found and the patient had a small frontal sinus but was open widely into the anterior ethmoid air cells.  This was done using through biting forceps to leave the mucosa intact on the walls.  The image guided system was used to evaluate the depth of the dissection and make sure all the air cells were opened.  The right sphenoid was not addressed or opened.  There was no significant bleeding.  Xerogel was then placed into the right ethmoid sinus and at the opening to the right maxillary sinus.  This was wetted to create a gel.  The nasal airway was open  on this side.  The left middle turbinate was infractured slightly as well to create a little more room in the middle meatus also.  The airways were then visualized and showed open passageways on both sides that were significantly improved compared to before surgery. There was no signifcant bleeding. Nasal splints were applied to both sides of the septum using Xomed 0.55m regular sized splints that were trimmed, and then held in position with a 3-0 Nylon through and through suture.  The patient was turned back over to anesthesia, and awakened, extubated, and taken to the PACU in satisfactory condition.  She tolerated the procedure well.  There were no operative complications.  Dispo:   PACU to home  Plan: Ice, elevation, narcotic analgesia, steroid taper, and prophylactic antibiotics for the duration of indwelling nasal foreign bodies.  We will reevaluate the patient in the office in 6 days and remove the septal splints.  Return to work in 10 days, strenuous activities in two weeks.   PElon AlasJuengel 07/19/2021 12:30 PM

## 2021-07-20 ENCOUNTER — Encounter: Payer: Self-pay | Admitting: Otolaryngology

## 2021-07-20 LAB — SURGICAL PATHOLOGY

## 2021-07-25 DIAGNOSIS — J3489 Other specified disorders of nose and nasal sinuses: Secondary | ICD-10-CM | POA: Diagnosis not present

## 2021-07-26 ENCOUNTER — Ambulatory Visit
Admission: RE | Admit: 2021-07-26 | Discharge: 2021-07-26 | Disposition: A | Discharge status: Home / Self Care | Payer: Medicare HMO | Source: Ambulatory Visit | Attending: Family Medicine | Admitting: Family Medicine

## 2021-07-26 DIAGNOSIS — Z1231 Encounter for screening mammogram for malignant neoplasm of breast: Secondary | ICD-10-CM | POA: Insufficient documentation

## 2021-07-28 DIAGNOSIS — B351 Tinea unguium: Secondary | ICD-10-CM | POA: Diagnosis not present

## 2021-08-02 DIAGNOSIS — Z7982 Long term (current) use of aspirin: Secondary | ICD-10-CM | POA: Diagnosis not present

## 2021-08-02 DIAGNOSIS — M79601 Pain in right arm: Secondary | ICD-10-CM | POA: Diagnosis not present

## 2021-08-02 DIAGNOSIS — I110 Hypertensive heart disease with heart failure: Secondary | ICD-10-CM | POA: Diagnosis not present

## 2021-08-02 DIAGNOSIS — I2693 Single subsegmental pulmonary embolism without acute cor pulmonale: Secondary | ICD-10-CM | POA: Diagnosis not present

## 2021-08-02 DIAGNOSIS — I82611 Acute embolism and thrombosis of superficial veins of right upper extremity: Secondary | ICD-10-CM | POA: Diagnosis not present

## 2021-08-02 DIAGNOSIS — I428 Other cardiomyopathies: Secondary | ICD-10-CM | POA: Diagnosis not present

## 2021-08-02 DIAGNOSIS — I82621 Acute embolism and thrombosis of deep veins of right upper extremity: Secondary | ICD-10-CM | POA: Diagnosis not present

## 2021-08-02 DIAGNOSIS — I82601 Acute embolism and thrombosis of unspecified veins of right upper extremity: Secondary | ICD-10-CM | POA: Diagnosis not present

## 2021-08-02 DIAGNOSIS — I5022 Chronic systolic (congestive) heart failure: Secondary | ICD-10-CM | POA: Diagnosis not present

## 2021-08-02 DIAGNOSIS — E039 Hypothyroidism, unspecified: Secondary | ICD-10-CM | POA: Diagnosis not present

## 2021-08-02 DIAGNOSIS — G894 Chronic pain syndrome: Secondary | ICD-10-CM | POA: Diagnosis not present

## 2021-08-02 DIAGNOSIS — I2699 Other pulmonary embolism without acute cor pulmonale: Secondary | ICD-10-CM | POA: Diagnosis not present

## 2021-08-02 DIAGNOSIS — Z7901 Long term (current) use of anticoagulants: Secondary | ICD-10-CM | POA: Diagnosis not present

## 2021-08-03 ENCOUNTER — Telehealth: Payer: Self-pay | Admitting: Physician Assistant

## 2021-08-03 DIAGNOSIS — I82621 Acute embolism and thrombosis of deep veins of right upper extremity: Secondary | ICD-10-CM | POA: Diagnosis not present

## 2021-08-03 DIAGNOSIS — I2699 Other pulmonary embolism without acute cor pulmonale: Secondary | ICD-10-CM | POA: Diagnosis not present

## 2021-08-03 NOTE — Telephone Encounter (Signed)
New Message:     Patient called and said she found out yesterday that she have DVT and an Embolism. She said it came from her Sinus surgery that she had on 07-19-21.

## 2021-08-04 NOTE — Telephone Encounter (Signed)
Reviewed provider recommendations and she reports that she is seeing a hematologist who is managing this for her. She had no further questions at this time.

## 2021-08-04 NOTE — Telephone Encounter (Signed)
Noted. She should notify her PCP as well for ongoing management.

## 2021-08-06 DIAGNOSIS — I82621 Acute embolism and thrombosis of deep veins of right upper extremity: Secondary | ICD-10-CM | POA: Diagnosis not present

## 2021-08-08 DIAGNOSIS — J3489 Other specified disorders of nose and nasal sinuses: Secondary | ICD-10-CM | POA: Diagnosis not present

## 2021-08-10 DIAGNOSIS — M159 Polyosteoarthritis, unspecified: Secondary | ICD-10-CM | POA: Diagnosis not present

## 2021-08-22 ENCOUNTER — Telehealth: Payer: Self-pay | Admitting: Cardiovascular Disease

## 2021-08-22 ENCOUNTER — Ambulatory Visit (INDEPENDENT_AMBULATORY_CARE_PROVIDER_SITE_OTHER): Payer: Medicare HMO

## 2021-08-22 DIAGNOSIS — I428 Other cardiomyopathies: Secondary | ICD-10-CM | POA: Diagnosis not present

## 2021-08-22 DIAGNOSIS — J3489 Other specified disorders of nose and nasal sinuses: Secondary | ICD-10-CM | POA: Diagnosis not present

## 2021-08-22 LAB — CUP PACEART REMOTE DEVICE CHECK
Battery Remaining Longevity: 36 mo
Battery Voltage: 2.97 V
Brady Statistic RV Percent Paced: 0.01 %
Date Time Interrogation Session: 20230725001604
HighPow Impedance: 55 Ohm
HighPow Impedance: 69 Ohm
Implantable Lead Implant Date: 20150508
Implantable Lead Location: 753860
Implantable Lead Model: 185
Implantable Lead Serial Number: 194163
Implantable Pulse Generator Implant Date: 20150508
Lead Channel Impedance Value: 551 Ohm
Lead Channel Impedance Value: 589 Ohm
Lead Channel Pacing Threshold Amplitude: 0.75 V
Lead Channel Pacing Threshold Pulse Width: 0.4 ms
Lead Channel Sensing Intrinsic Amplitude: 14.5 mV
Lead Channel Sensing Intrinsic Amplitude: 14.5 mV
Lead Channel Setting Pacing Amplitude: 2 V
Lead Channel Setting Pacing Pulse Width: 0.4 ms
Lead Channel Setting Sensing Sensitivity: 0.3 mV

## 2021-08-22 NOTE — Telephone Encounter (Signed)
Spoke with patient regarding request for samples. Advised that we are only able to provide samples as we reserve those for new start patients. Encouraged her to do applications for assistance for both Eliquis and Jardiance. She was agreeable and requested I send her the necessary documents needed in order to process. Advised that when she brings in income we can have her sign at that time. She verbalized understanding with no further questions at this time.

## 2021-08-22 NOTE — Telephone Encounter (Signed)
Patient calling the office for samples of medication:   1.  What medication and dosage are you requesting samples for? JARDIANCE 10 MG TABS tablet  2.  Are you currently out of this medication? No  Patient said her co-pay went up to $145 and she can not afford that at this time

## 2021-08-24 ENCOUNTER — Other Ambulatory Visit: Payer: Self-pay | Admitting: *Deleted

## 2021-08-28 DIAGNOSIS — H1031 Unspecified acute conjunctivitis, right eye: Secondary | ICD-10-CM | POA: Diagnosis not present

## 2021-09-01 NOTE — Telephone Encounter (Signed)
Application has been completed and pending income & out of pocket expense report. Application has been filed under "Patient assistance" pending documentation from patient.

## 2021-09-05 DIAGNOSIS — Z0100 Encounter for examination of eyes and vision without abnormal findings: Secondary | ICD-10-CM | POA: Diagnosis not present

## 2021-09-05 DIAGNOSIS — H5200 Hypermetropia, unspecified eye: Secondary | ICD-10-CM | POA: Diagnosis not present

## 2021-09-18 DIAGNOSIS — E782 Mixed hyperlipidemia: Secondary | ICD-10-CM | POA: Diagnosis not present

## 2021-09-18 DIAGNOSIS — Z9989 Dependence on other enabling machines and devices: Secondary | ICD-10-CM | POA: Diagnosis not present

## 2021-09-18 DIAGNOSIS — I420 Dilated cardiomyopathy: Secondary | ICD-10-CM | POA: Diagnosis not present

## 2021-09-18 DIAGNOSIS — I10 Essential (primary) hypertension: Secondary | ICD-10-CM | POA: Diagnosis not present

## 2021-09-18 DIAGNOSIS — I519 Heart disease, unspecified: Secondary | ICD-10-CM | POA: Diagnosis not present

## 2021-09-18 DIAGNOSIS — I5020 Unspecified systolic (congestive) heart failure: Secondary | ICD-10-CM | POA: Diagnosis not present

## 2021-09-18 DIAGNOSIS — Z95810 Presence of automatic (implantable) cardiac defibrillator: Secondary | ICD-10-CM | POA: Diagnosis not present

## 2021-09-18 DIAGNOSIS — G4733 Obstructive sleep apnea (adult) (pediatric): Secondary | ICD-10-CM | POA: Diagnosis not present

## 2021-09-19 NOTE — Progress Notes (Signed)
Remote ICD transmission.   

## 2021-09-27 ENCOUNTER — Other Ambulatory Visit: Payer: Self-pay | Admitting: Cardiovascular Disease

## 2021-10-04 DIAGNOSIS — M47817 Spondylosis without myelopathy or radiculopathy, lumbosacral region: Secondary | ICD-10-CM | POA: Diagnosis not present

## 2021-10-04 DIAGNOSIS — Z79899 Other long term (current) drug therapy: Secondary | ICD-10-CM | POA: Diagnosis not present

## 2021-10-04 DIAGNOSIS — R202 Paresthesia of skin: Secondary | ICD-10-CM | POA: Diagnosis not present

## 2021-10-04 DIAGNOSIS — G894 Chronic pain syndrome: Secondary | ICD-10-CM | POA: Diagnosis not present

## 2021-10-04 DIAGNOSIS — M545 Low back pain, unspecified: Secondary | ICD-10-CM | POA: Diagnosis not present

## 2021-10-04 DIAGNOSIS — M533 Sacrococcygeal disorders, not elsewhere classified: Secondary | ICD-10-CM | POA: Diagnosis not present

## 2021-10-23 DIAGNOSIS — I82629 Acute embolism and thrombosis of deep veins of unspecified upper extremity: Secondary | ICD-10-CM | POA: Diagnosis not present

## 2021-10-23 DIAGNOSIS — R7309 Other abnormal glucose: Secondary | ICD-10-CM | POA: Diagnosis not present

## 2021-10-23 DIAGNOSIS — F331 Major depressive disorder, recurrent, moderate: Secondary | ICD-10-CM | POA: Diagnosis not present

## 2021-10-23 DIAGNOSIS — R42 Dizziness and giddiness: Secondary | ICD-10-CM | POA: Diagnosis not present

## 2021-10-23 DIAGNOSIS — Z23 Encounter for immunization: Secondary | ICD-10-CM | POA: Diagnosis not present

## 2021-10-23 DIAGNOSIS — E038 Other specified hypothyroidism: Secondary | ICD-10-CM | POA: Diagnosis not present

## 2021-10-23 DIAGNOSIS — G894 Chronic pain syndrome: Secondary | ICD-10-CM | POA: Diagnosis not present

## 2021-10-25 DIAGNOSIS — M533 Sacrococcygeal disorders, not elsewhere classified: Secondary | ICD-10-CM | POA: Diagnosis not present

## 2021-10-26 NOTE — Progress Notes (Deleted)
Cardiology Office Note    Date:  10/26/2021   ID:  Lisa Fuentes, DOB [birth year], MRN [id number]  PCP:  Lisa Pink, MD  Cardiologist:  Lisa Sacramento, MD  Electrophysiologist:  Lisa Axe, MD   Chief Complaint: ***  History of Present Illness:   Lisa Fuentes is a 61 y.o. female with history of ***  ***   Labs independently reviewed: ***  Past Medical History:  Diagnosis Date   AICD (automatic cardioverter/defibrillator) present 2008   a.) Medtronic device placed 2008. b.) replaced 06/21/2013 (Medtronic Evera XT single-chamber ICD; model number [id number]).   Anemia    Anxiety    Aortic dilatation ([place]) 03/24/2019   a.) TTE 03/24/2019: Ao root measured 37 mm. b.) TTE 03/21/2020: Ao root measured 37 mm.   Arthritis    Depression    Difficult intubation    DOE (dyspnea on exertion)    Endometriosis of vagina 09/2013   Intra-operative findings of endometriosis implants on cervical stump   Essential hypertension    GERD (gastroesophageal reflux disease)    Headache    HFrEF (heart failure with reduced ejection fraction) ([place])    a.) TTE 02/18/2014: EF 40-45%; tiv AR, mild MR/TR/PR; G1DD. b.) TTE 02/22/2017: EF 45-50%; mild LVH; triv PR; G1DD. c.) TTE 03/24/2019: EF 45-50%; glob HK, mild LVH; triv MR/TR/PR. d.) TTE 03/21/2020: EF 40-45%; glob HK.   History of 2019 novel coronavirus disease (COVID-19) 02/02/2020   History of 2019 novel coronavirus disease (COVID-19) 02/02/2020   Hypokalemia    Hypothyroidism    a.) s/p radioactive iodine Tx; on levothyroxine   Mixed hyperlipidemia    Nonischemic cardiomyopathy ([place])    a.) TTE 02/18/2014: EF 40-45%. b.) TTE 02/22/2017: EF 45-50%. c.) TTE 03/24/2019: EF 45-50%. d.) TTE 03/21/2020: EF 40-45%   OSA on CPAP    Panic attacks    PSVT (paroxysmal supraventricular tachycardia) ([place]) 03/30/2020   a.) Holter 03/30/2020 --> 2 runs; fastest lasting 4 beats (184 bpm); longest lasting 4 beats (113 bpm)   Tachycardia      Past Surgical History:  Procedure Laterality Date   ANTERIOR AND POSTERIOR REPAIR N/A 11/25/2017   Procedure: ANTERIOR (CYSTOCELE) AND POSTERIOR REPAIR (RECTOCELE);  Surgeon: Rubie Maid, MD;  Location: [facility];  Service: Gynecology;  Laterality: N/A;   BREAST CYST ASPIRATION Left 03/22/2014   neg/ done by Dr Jamal Collin   CARDIAC CATHETERIZATION  2003   [place]etal: No significant coronary artery disease with reduced ejection fraction.   CARDIAC DEFIBRILLATOR PLACEMENT  04/2006   replaced 05/2013   CARDIAC DEFIBRILLATOR PLACEMENT  06/21/2013   Procedure:  CARDIAC DEFIBRILLATOR PLACEMENT (Medtronic single-chamber ICD, model number Evalyn Casco, model number [id number]): Location: [facility]; Surgeon: Mylinda Latina, MD   CARPAL TUNNEL RELEASE Right    COLONOSCOPY  2015   COLONOSCOPY N/A 10/13/2020   Procedure: COLONOSCOPY;  Surgeon: Annamaria Helling, DO;  Location: [facility];  Service: Gastroenterology;  Laterality: N/A;   COLONOSCOPY WITH PROPOFOL N/A 10/04/2015   Procedure: COLONOSCOPY WITH PROPOFOL;  Surgeon: Lucilla Lame, MD;  Location: [facility];  Service: Endoscopy;  Laterality: N/A;   CORONARY ANGIOPLASTY     CYSTOSCOPY  11/15/2014   Procedure: CYSTOSCOPY;  Surgeon: Rubie Maid, MD;  Location: [facility];  Service: Gynecology;;   DIAGNOSTIC LAPAROSCOPY     ETHMOIDECTOMY Right 07/19/2021   Procedure: TOTAL ETHMOIDECTOMY WITH FRONTAL SINUS EXPLORATION;  Surgeon: Margaretha Sheffield, MD;  Location: [facility];  Service: ENT;  Laterality: Right;   FOOT SURGERY Bilateral  heer spur and bunion   IMAGE GUIDED SINUS SURGERY N/A 07/19/2021   Procedure: IMAGE GUIDED SINUS SURGERY;  Surgeon: Margaretha Sheffield, MD;  Location: [facility];  Service: ENT;  Laterality: N/A;   KNEE ARTHROSCOPY Left 03/02/2015   Procedure: ARTHROSCOPY  LEFT KNEE, PARTIAL LATERAL  MENISECTOMY, SYNOVECTOMY, MEDIAL & LATERAL CHONDROPLASTY;  Surgeon: Thornton Park, MD;  Location: [facility];  Service: Orthopedics;  Laterality:  Left;   LAPAROSCOPIC SALPINGO OOPHERECTOMY Left 11/15/2014   Procedure: LAPAROSCOPIC OOPHORECTOMY;  Surgeon: Rubie Maid, MD;  Location: [facility];  Service: Gynecology;  Laterality: Left;   LAPAROSCOPIC SALPINGOOPHERECTOMY Right 09/2013   also with left salpingectomy   LEFT HEART CATH AND CORONARY ANGIOGRAPHY N/A 03/28/2020   Procedure: LEFT HEART CATH AND CORONARY ANGIOGRAPHY;  Surgeon: Wellington Hampshire, MD;  Location: [facility];  Service: Cardiovascular;  Laterality: N/A;   MAXILLARY ANTROSTOMY Right 07/19/2021   Procedure: MAXILLARY ANTROSTOMY WITH REMOVAL TISSUE;  Surgeon: Margaretha Sheffield, MD;  Location: [facility];  Service: ENT;  Laterality: Right;   MINOR HEMORRHOIDECTOMY     NASAL TURBINATE REDUCTION Bilateral 07/19/2021   Procedure: INFERIOR TURBINATE REDUCTION;  Surgeon: Margaretha Sheffield, MD;  Location: [facility];  Service: ENT;  Laterality: Bilateral;   SEPTOPLASTY N/A 07/19/2021   Procedure: SEPTOPLASTY;  Surgeon: Margaretha Sheffield, MD;  Location: [facility];  Service: ENT;  Laterality: N/A;   TONSILLECTOMY     TOTAL ABDOMINAL HYSTERECTOMY     TRACHELECTOMY N/A 11/15/2014   Procedure: TRACHELECTOMY;  Surgeon: Rubie Maid, MD;  Location: [facility];  Service: Gynecology;  Laterality: N/A;   TRIGGER FINGER RELEASE Right    TUBAL LIGATION Bilateral     Current Medications: No outpatient medications have been marked as taking for the 10/27/21 encounter (Appointment) with Lisa Mu, PA-C.    Allergies:   Sulfa antibiotics and Sulfamethoxazole-trimethoprim   Social History   Socioeconomic History   Marital status: Married    Spouse name: Jimmy   Number of children: 2   Years of education: Not on file   Highest education level: Not on file  Occupational History   Not on file  Tobacco Use   Smoking status: Never   Smokeless tobacco: Never  Vaping Use   Vaping Use: Never used  Substance and Sexual Activity   Alcohol use: Yes    Alcohol/week: 0.0 standard drinks of alcohol     Comment: occassionally   Drug use: No   Sexual activity: Not Currently    Birth control/protection: Surgical  Other Topics Concern   Not on file  Social History Narrative   Not on file   Social Determinants of Health   Financial Resource Strain: Not on file  Food Insecurity: Not on file  Transportation Needs: Not on file  Physical Activity: Not on file  Stress: Not on file  Social Connections: Not on file     Family History:  The patient's family history includes Breast cancer (age of onset: 68) in her sister; Colon cancer in her mother; Hyperlipidemia in her mother; Hypertension in her mother; Ovarian cancer in her sister; Stroke in her mother; Throat cancer in her brother.  ROS:   ROS   EKGs/Labs/Other Studies Reviewed:    Studies reviewed were summarized above. The additional studies were reviewed today:  2D Echo 01/2017: - Left ventricle: The cavity size was mildly dilated. There was   mild concentric hypertrophy. Systolic function was mildly   reduced. The estimated ejection fraction was in the range of 45%  to 50%. Doppler parameters are consistent with abnormal left   ventricular relaxation (grade 1 diastolic dysfunction). - Left atrium: The atrium was mildly dilated. - Pulmonary arteries: Systolic pressure could not be accurately   estimated. __________   2D Echo 03/24/2019: 1. Left ventricular ejection fraction, by estimation, is 45 to 50%. The  left ventricle has mildly decreased function. The left ventricle  demonstrates global hypokinesis. The left ventricular internal cavity size  was mildly dilated. There is mild left  ventricular hypertrophy. Left ventricular diastolic parameters are  consistent with Grade I diastolic dysfunction (impaired relaxation).   2. Right ventricular systolic function is normal. The right ventricular  size is normal. Tricuspid regurgitation signal is inadequate for assessing  PA pressure.   3. The mitral valve is normal in  structure and function. Trivial mitral  valve regurgitation.   4. The aortic valve is tricuspid. Aortic valve regurgitation is not  visualized. No aortic stenosis is present.   5. Aortic dilatation noted. There is borderline dilatation of the aortic  root measuring 37 mm.   6. Mildly dilated pulmonary artery.   7. The inferior vena cava is normal in size with greater than 50%  respiratory variability, suggesting right atrial pressure of 3 mmHg. __________   Coronary CTA 04/2019: IMPRESSION: 1. No evidence of CAD, CADRADS = 0. 2. Coronary calcium score of 0. 3. Normal coronary origin with right dominance. __________   [facility] Adam MPI 03/21/2020: There was no ST segment deviation noted during stress. No T wave inversion was noted during stress. Defect 1: There is a large defect of moderate severity present in the mid anterior, apical anterior and apex location. Findings consistent with ischemia in the LAD distribution. This is an intermediate risk study. The left ventricular ejection fraction is moderately decreased (30-44%).   __________   2D echo 03/21/2020: 1. Left ventricular ejection fraction, by estimation, is 40 to 45%. The  left ventricle has mildly decreased function. Left ventricular endocardial  border not optimally defined to evaluate regional wall motion. Left  ventricular diastolic parameters are  consistent with Grade I diastolic dysfunction (impaired relaxation).   2. Right ventricular systolic function is normal. The right ventricular  size is normal.   3. The mitral valve is normal in structure. No evidence of mitral valve  regurgitation. No evidence of mitral stenosis.   4. The aortic valve is normal in structure. Aortic valve regurgitation is  not visualized. No aortic stenosis is present.   5. The inferior vena cava is normal in size with greater than 50%  respiratory variability, suggesting right atrial pressure of 3 mmHg. __________   LHC 03/2020: 1.  Normal  coronary arteries. 2.  Left ventricular angiography was not performed.  EF was mildly reduced by echo. 3.  Mildly elevated left ventricular end-diastolic pressure 20 mmHg.   Recommendations: False positive nuclear stress test. Recommend continuing medical therapy for nonischemic cardiomyopathy. __________   [facility]wyn Reach patch 03/2020: Patient had a min HR of 53 bpm, max HR of 184 bpm, and avg HR of 85 bpm. Predominant underlying rhythm was Sinus Rhythm. Possible Atrial and Ventricular Pacing was present. 2 Supraventricular Tachycardia runs occurred, the run with the fastest interval lasting 4 beats with a max rate of 184 bpm, the longest lasting 4 beats with an avg rate of 113 bpm. Rare PACs and rare PVCs. Most triggered events did not correlate with arrhythmia other than sinus tachycardia.   EKG:  EKG is ordered today.  The EKG ordered today  demonstrates ***  Recent Labs: 05/15/2021: BUN 17; Creatinine, Ser 0.84; Sodium 143 07/12/2021: Hemoglobin 14.0; Platelets 202; Potassium 3.6  Recent Lipid Panel No results found for: "CHOL", "TRIG", "HDL", "CHOLHDL", "VLDL", "LDLCALC", "LDLDIRECT"  PHYSICAL EXAM:    VS:  There were no vitals taken for this visit.  BMI: There is no height or weight on file to calculate BMI.  Physical Exam  Wt Readings from Last 3 Encounters:  07/19/21 252 lb (114.3 kg)  07/11/21 255 lb (115.7 kg)  06/19/21 256 lb 3.2 oz (116.2 kg)     ASSESSMENT & PLAN:   ***   {Are you ordering a CV Procedure (e.g. stress test, cath, DCCV, TEE, etc)?   Press F2        :[id number]}     Disposition: F/u with Dr. Fletcher Anon or an APP in ***.   Medication Adjustments/Labs and Tests Ordered: Current medicines are reviewed at length with the patient today.  Concerns regarding medicines are outlined above. Medication changes, Labs and Tests ordered today are summarized above and listed in the Patient Instructions accessible in Encounters.   Signed, Christell Faith, PA-C 10/26/2021 12:40 PM      [address], [place] [address] [phone]

## 2021-10-27 ENCOUNTER — Ambulatory Visit: Payer: Medicare HMO | Attending: Physician Assistant | Admitting: Physician Assistant

## 2021-10-27 ENCOUNTER — Encounter: Payer: Self-pay | Admitting: Physician Assistant

## 2021-11-07 DIAGNOSIS — I82621 Acute embolism and thrombosis of deep veins of right upper extremity: Secondary | ICD-10-CM | POA: Diagnosis not present

## 2021-11-20 ENCOUNTER — Encounter: Payer: Self-pay | Admitting: *Deleted

## 2021-11-28 DIAGNOSIS — I871 Compression of vein: Secondary | ICD-10-CM | POA: Diagnosis not present

## 2021-11-28 DIAGNOSIS — I82621 Acute embolism and thrombosis of deep veins of right upper extremity: Secondary | ICD-10-CM | POA: Diagnosis not present

## 2021-12-06 DIAGNOSIS — R202 Paresthesia of skin: Secondary | ICD-10-CM | POA: Diagnosis not present

## 2021-12-06 DIAGNOSIS — G894 Chronic pain syndrome: Secondary | ICD-10-CM | POA: Diagnosis not present

## 2021-12-06 DIAGNOSIS — Z79899 Other long term (current) drug therapy: Secondary | ICD-10-CM | POA: Diagnosis not present

## 2021-12-06 DIAGNOSIS — M533 Sacrococcygeal disorders, not elsewhere classified: Secondary | ICD-10-CM | POA: Diagnosis not present

## 2021-12-06 DIAGNOSIS — M545 Low back pain, unspecified: Secondary | ICD-10-CM | POA: Diagnosis not present

## 2021-12-06 DIAGNOSIS — Z01818 Encounter for other preprocedural examination: Secondary | ICD-10-CM | POA: Diagnosis not present

## 2021-12-06 DIAGNOSIS — M47817 Spondylosis without myelopathy or radiculopathy, lumbosacral region: Secondary | ICD-10-CM | POA: Diagnosis not present

## 2021-12-08 DIAGNOSIS — G5602 Carpal tunnel syndrome, left upper limb: Secondary | ICD-10-CM | POA: Diagnosis not present

## 2021-12-08 DIAGNOSIS — Z7901 Long term (current) use of anticoagulants: Secondary | ICD-10-CM | POA: Diagnosis not present

## 2021-12-12 DIAGNOSIS — I420 Dilated cardiomyopathy: Secondary | ICD-10-CM | POA: Diagnosis not present

## 2022-01-08 DIAGNOSIS — E782 Mixed hyperlipidemia: Secondary | ICD-10-CM | POA: Diagnosis not present

## 2022-01-08 DIAGNOSIS — M461 Sacroiliitis, not elsewhere classified: Secondary | ICD-10-CM | POA: Diagnosis not present

## 2022-01-08 DIAGNOSIS — Z0000 Encounter for general adult medical examination without abnormal findings: Secondary | ICD-10-CM | POA: Diagnosis not present

## 2022-01-08 DIAGNOSIS — F329 Major depressive disorder, single episode, unspecified: Secondary | ICD-10-CM | POA: Diagnosis not present

## 2022-01-08 DIAGNOSIS — I110 Hypertensive heart disease with heart failure: Secondary | ICD-10-CM | POA: Diagnosis not present

## 2022-01-08 DIAGNOSIS — E0590 Thyrotoxicosis, unspecified without thyrotoxic crisis or storm: Secondary | ICD-10-CM | POA: Diagnosis not present

## 2022-01-08 DIAGNOSIS — I5020 Unspecified systolic (congestive) heart failure: Secondary | ICD-10-CM | POA: Diagnosis not present

## 2022-01-08 DIAGNOSIS — Z1331 Encounter for screening for depression: Secondary | ICD-10-CM | POA: Diagnosis not present

## 2022-01-08 DIAGNOSIS — F3341 Major depressive disorder, recurrent, in partial remission: Secondary | ICD-10-CM | POA: Diagnosis not present

## 2022-01-15 ENCOUNTER — Other Ambulatory Visit: Payer: Self-pay | Admitting: Cardiovascular Disease

## 2022-01-15 DIAGNOSIS — I5022 Chronic systolic (congestive) heart failure: Secondary | ICD-10-CM

## 2022-01-15 NOTE — Telephone Encounter (Signed)
Please advise Looks like patient has cancelled all her appts with [place].   Looks like she has been seeing [facility]

## 2022-01-15 NOTE — Telephone Encounter (Signed)
Please advise Looks like patient has cancelled all her appts with [place].   Looks like she has been seeing [facility]

## 2022-01-16 DIAGNOSIS — I5020 Unspecified systolic (congestive) heart failure: Secondary | ICD-10-CM | POA: Diagnosis not present

## 2022-01-16 DIAGNOSIS — I10 Essential (primary) hypertension: Secondary | ICD-10-CM | POA: Diagnosis not present

## 2022-01-16 DIAGNOSIS — Z95810 Presence of automatic (implantable) cardiac defibrillator: Secondary | ICD-10-CM | POA: Diagnosis not present

## 2022-01-16 DIAGNOSIS — G4733 Obstructive sleep apnea (adult) (pediatric): Secondary | ICD-10-CM | POA: Diagnosis not present

## 2022-01-16 DIAGNOSIS — I428 Other cardiomyopathies: Secondary | ICD-10-CM | POA: Diagnosis not present

## 2022-01-16 DIAGNOSIS — E782 Mixed hyperlipidemia: Secondary | ICD-10-CM | POA: Diagnosis not present

## 2022-01-16 DIAGNOSIS — I420 Dilated cardiomyopathy: Secondary | ICD-10-CM | POA: Diagnosis not present

## 2022-01-17 DIAGNOSIS — M533 Sacrococcygeal disorders, not elsewhere classified: Secondary | ICD-10-CM | POA: Diagnosis not present

## 2022-02-07 DIAGNOSIS — M545 Low back pain, unspecified: Secondary | ICD-10-CM | POA: Diagnosis not present

## 2022-02-07 DIAGNOSIS — M79673 Pain in unspecified foot: Secondary | ICD-10-CM | POA: Diagnosis not present

## 2022-02-07 DIAGNOSIS — Z5181 Encounter for therapeutic drug level monitoring: Secondary | ICD-10-CM | POA: Diagnosis not present

## 2022-02-07 DIAGNOSIS — G894 Chronic pain syndrome: Secondary | ICD-10-CM | POA: Diagnosis not present

## 2022-02-07 DIAGNOSIS — M533 Sacrococcygeal disorders, not elsewhere classified: Secondary | ICD-10-CM | POA: Diagnosis not present

## 2022-02-07 DIAGNOSIS — M47817 Spondylosis without myelopathy or radiculopathy, lumbosacral region: Secondary | ICD-10-CM | POA: Diagnosis not present

## 2022-02-07 DIAGNOSIS — Z79899 Other long term (current) drug therapy: Secondary | ICD-10-CM | POA: Diagnosis not present

## 2022-02-07 DIAGNOSIS — R202 Paresthesia of skin: Secondary | ICD-10-CM | POA: Diagnosis not present

## 2022-02-14 DIAGNOSIS — M79671 Pain in right foot: Secondary | ICD-10-CM | POA: Diagnosis not present

## 2022-02-14 DIAGNOSIS — M25571 Pain in right ankle and joints of right foot: Secondary | ICD-10-CM | POA: Diagnosis not present

## 2022-02-14 DIAGNOSIS — G894 Chronic pain syndrome: Secondary | ICD-10-CM | POA: Diagnosis not present

## 2022-02-28 DIAGNOSIS — M76822 Posterior tibial tendinitis, left leg: Secondary | ICD-10-CM | POA: Diagnosis not present

## 2022-02-28 DIAGNOSIS — I5022 Chronic systolic (congestive) heart failure: Secondary | ICD-10-CM | POA: Diagnosis not present

## 2022-02-28 DIAGNOSIS — R5383 Other fatigue: Secondary | ICD-10-CM | POA: Diagnosis not present

## 2022-02-28 DIAGNOSIS — R0602 Shortness of breath: Secondary | ICD-10-CM | POA: Diagnosis not present

## 2022-02-28 DIAGNOSIS — R5381 Other malaise: Secondary | ICD-10-CM | POA: Diagnosis not present

## 2022-02-28 DIAGNOSIS — E785 Hyperlipidemia, unspecified: Secondary | ICD-10-CM | POA: Diagnosis not present

## 2022-02-28 DIAGNOSIS — E039 Hypothyroidism, unspecified: Secondary | ICD-10-CM | POA: Diagnosis not present

## 2022-02-28 DIAGNOSIS — R262 Difficulty in walking, not elsewhere classified: Secondary | ICD-10-CM | POA: Diagnosis not present

## 2022-02-28 DIAGNOSIS — R7303 Prediabetes: Secondary | ICD-10-CM | POA: Diagnosis not present

## 2022-02-28 DIAGNOSIS — M25572 Pain in left ankle and joints of left foot: Secondary | ICD-10-CM | POA: Diagnosis not present

## 2022-02-28 DIAGNOSIS — R519 Headache, unspecified: Secondary | ICD-10-CM | POA: Diagnosis not present

## 2022-02-28 DIAGNOSIS — Z23 Encounter for immunization: Secondary | ICD-10-CM | POA: Diagnosis not present

## 2022-02-28 DIAGNOSIS — M25571 Pain in right ankle and joints of right foot: Secondary | ICD-10-CM | POA: Diagnosis not present

## 2022-03-01 DIAGNOSIS — M159 Polyosteoarthritis, unspecified: Secondary | ICD-10-CM | POA: Diagnosis not present

## 2022-03-08 DIAGNOSIS — S92515A Nondisplaced fracture of proximal phalanx of left lesser toe(s), initial encounter for closed fracture: Secondary | ICD-10-CM | POA: Diagnosis not present

## 2022-03-21 DIAGNOSIS — S92515A Nondisplaced fracture of proximal phalanx of left lesser toe(s), initial encounter for closed fracture: Secondary | ICD-10-CM | POA: Diagnosis not present

## 2022-03-22 DIAGNOSIS — M545 Low back pain, unspecified: Secondary | ICD-10-CM | POA: Diagnosis not present

## 2022-03-22 DIAGNOSIS — R202 Paresthesia of skin: Secondary | ICD-10-CM | POA: Diagnosis not present

## 2022-03-22 DIAGNOSIS — M79673 Pain in unspecified foot: Secondary | ICD-10-CM | POA: Diagnosis not present

## 2022-03-22 DIAGNOSIS — M533 Sacrococcygeal disorders, not elsewhere classified: Secondary | ICD-10-CM | POA: Diagnosis not present

## 2022-03-22 DIAGNOSIS — M47817 Spondylosis without myelopathy or radiculopathy, lumbosacral region: Secondary | ICD-10-CM | POA: Diagnosis not present

## 2022-03-22 DIAGNOSIS — G894 Chronic pain syndrome: Secondary | ICD-10-CM | POA: Diagnosis not present

## 2022-03-22 DIAGNOSIS — Z79899 Other long term (current) drug therapy: Secondary | ICD-10-CM | POA: Diagnosis not present

## 2022-04-18 ENCOUNTER — Ambulatory Visit
Admission: EM | Admit: 2022-04-18 | Discharge: 2022-04-18 | Disposition: A | Discharge status: Home / Self Care | Payer: Medicare HMO | Attending: Urgent Care | Admitting: Urgent Care

## 2022-04-18 DIAGNOSIS — S92515A Nondisplaced fracture of proximal phalanx of left lesser toe(s), initial encounter for closed fracture: Secondary | ICD-10-CM | POA: Diagnosis not present

## 2022-04-18 DIAGNOSIS — R21 Rash and other nonspecific skin eruption: Secondary | ICD-10-CM

## 2022-04-18 MED ORDER — FLUOCINONIDE EMULSIFIED BASE 0.05 % EX CREA: 1.0000 | TOPICAL_CREAM | Freq: Two times a day (BID) | CUTANEOUS | 0 refills | Status: DC

## 2022-04-18 MED ORDER — PERMETHRIN 5 % EX CREA: TOPICAL_CREAM | CUTANEOUS | 0 refills | Status: DC

## 2022-04-18 NOTE — Discharge Instructions (Signed)
Follow up here or with your primary care provider if your symptoms are worsening or not improving with treatment.     

## 2022-04-18 NOTE — ED Triage Notes (Signed)
Patient presents to [facility] for chest and abdominal rash since yesterday. States it is very painful, has applied cortisone cream. Denies fever or changing meds, products, or diet.

## 2022-04-18 NOTE — ED Provider Notes (Signed)
Roderic Palau    CSN: [id number] Arrival date & time: 04/18/22  1621      History   Chief Complaint Chief Complaint  Patient presents with   Rash    HPI MONTRICE BETTINI is a 62 y.o. female.    Rash   Presents to urgent care with rash on her chest and abdomen started yesterday.  She endorses pain associated with the rash.  Denies fever.  Denies new medications.  Denies use of any new products or change in diet.  She states she recently went to the beach but did not lay on the sand, did not go in the water.  She states she went shopping but did not try anything on.  She endorses using hotel soap for 1 day but on her entire body.  Patient also endorses helping out at a family members daycare frequently.  Past Medical History:  Diagnosis Date   AICD (automatic cardioverter/defibrillator) present 2008   a.) Medtronic device placed 2008. b.) replaced 06/21/2013 (Medtronic Evera XT single-chamber ICD; model number [id number]).   Anemia    Anxiety    Aortic dilatation ([place]) 03/24/2019   a.) TTE 03/24/2019: Ao root measured 37 mm. b.) TTE 03/21/2020: Ao root measured 37 mm.   Arthritis    Depression    Difficult intubation    DOE (dyspnea on exertion)    Endometriosis of vagina 09/2013   Intra-operative findings of endometriosis implants on cervical stump   Essential hypertension    GERD (gastroesophageal reflux disease)    Headache    HFrEF (heart failure with reduced ejection fraction) ([place])    a.) TTE 02/18/2014: EF 40-45%; tiv AR, mild MR/TR/PR; G1DD. b.) TTE 02/22/2017: EF 45-50%; mild LVH; triv PR; G1DD. c.) TTE 03/24/2019: EF 45-50%; glob HK, mild LVH; triv MR/TR/PR. d.) TTE 03/21/2020: EF 40-45%; glob HK.   History of 2019 novel coronavirus disease (COVID-19) 02/02/2020   History of 2019 novel coronavirus disease (COVID-19) 02/02/2020   Hypokalemia    Hypothyroidism    a.) s/p radioactive iodine Tx; on levothyroxine   Mixed hyperlipidemia    Nonischemic  cardiomyopathy ([place])    a.) TTE 02/18/2014: EF 40-45%. b.) TTE 02/22/2017: EF 45-50%. c.) TTE 03/24/2019: EF 45-50%. d.) TTE 03/21/2020: EF 40-45%   OSA on CPAP    Panic attacks    PSVT (paroxysmal supraventricular tachycardia) 03/30/2020   a.) Holter 03/30/2020 --> 2 runs; fastest lasting 4 beats (184 bpm); longest lasting 4 beats (113 bpm)   Tachycardia     Patient Active Problem List   Diagnosis Date Noted   Abnormal cardiovascular stress test    Anxiety 05/27/2019   Chronic pain syndrome 05/27/2019   Depression 05/27/2019   HFrEF (heart failure with reduced ejection fraction) ([place]) 05/27/2019   Morbid obesity ([place]) 03/03/2019   Tibialis posterior tendinitis 08/22/2017   Chest pain of uncertain etiology [id number]   Mixed hyperlipidemia 09/10/2016   Low back pain 03/22/2016   Family history of malignant neoplasm of gastrointestinal tract    Dizziness 03/27/2015   Postoperative state 11/15/2014   LV dysfunction 10/29/2014   Knee pain 10/22/2014   Endometriosis 09/18/2014   ICD (implantable cardioverter-defibrillator) in place 99991111   Chronic systolic heart failure [facility])    Essential hypertension    Hypercholesteremia    Sleep apnea 05/12/2013   Hyperthyroidism 05/08/2013   Dilated cardiomyopathy ([place]) 05/08/2013    Past Surgical History:  Procedure Laterality Date   ANTERIOR AND POSTERIOR REPAIR N/A 11/25/2017  Procedure: ANTERIOR (CYSTOCELE) AND POSTERIOR REPAIR (RECTOCELE);  Surgeon: Rubie Maid, MD;  Location: [facility] ORS;  Service: Gynecology;  Laterality: N/A;   BREAST CYST ASPIRATION Left 03/22/2014   neg/ done by Dr Jamal Collin   CARDIAC CATHETERIZATION  2003   [place]: No significant coronary artery disease with reduced ejection fraction.   CARDIAC DEFIBRILLATOR PLACEMENT  04/2006   replaced 05/2013   CARDIAC DEFIBRILLATOR PLACEMENT  06/21/2013   Procedure:  CARDIAC DEFIBRILLATOR PLACEMENT (Medtronic single-chamber ICD, model number Evalyn Casco, model number  [id number]): [facility]; Surgeon: Mylinda Latina, MD   CARPAL TUNNEL RELEASE Right    COLONOSCOPY  2015   COLONOSCOPY N/A 10/13/2020   Procedure: COLONOSCOPY;  Surgeon: Annamaria Helling, DO;  Location: [facility] END[facility]COPY;  Service: Gastroenterology;  Laterality: N/A;   COLONOSCOPY WITH PROPOFOL N/A 10/04/2015   Procedure: COLONOSCOPY WITH PROPOFOL;  Surgeon: Lucilla Lame, MD;  Location: [facility] ENDOSCOPY;  Service: Endoscopy;  Laterality: N/A;   CORONARY ANGIOPLASTY     CYSTOSCOPY  11/15/2014   Procedure: CYSTOSCOPY;  Surgeon: Rubie Maid, MD;  Location: [facility];  Service: Gynecology;;   DIAGNOSTIC LAPAROSCOPY     ETHMOIDECTOMY Right 07/19/2021   Procedure: TOTAL ETHMOIDECTOMY WITH FRONTAL SINUS EXPLORATION;  Surgeon: Margaretha Sheffield, MD;  Location: [facility];  Service: ENT;  Laterality: Right;   FOOT SURGERY Bilateral    heer spur and bunion   IMAGE GUIDED SINUS SURGERY N/A 07/19/2021   Procedure: IMAGE GUIDED SINUS SURGERY;  Surgeon: Margaretha Sheffield, MD;  Location: [facility];  Service: ENT;  Laterality: N/A;   KNEE ARTHROSCOPY Left 03/02/2015   Procedure: ARTHROSCOPY  LEFT KNEE, PARTIAL LATERAL  MENISECTOMY, SYNOVECTOMY, MEDIAL & LATERAL CHONDROPLASTY;  Surgeon: Thornton Park, MD;  Location: [facility];  Service: Orthopedics;  Laterality: Left;   LAPAROSCOPIC SALPINGO OOPHERECTOMY Left 11/15/2014   Procedure: LAPAROSCOPIC OOPHORECTOMY;  Surgeon: Rubie Maid, MD;  Location: [facility];  Service: Gynecology;  Laterality: Left;   LAPAROSCOPIC SALPINGOOPHERECTOMY Right 09/2013   also with left salpingectomy   LEFT HEART CATH AND CORONARY ANGIOGRAPHY N/A 03/28/2020   Procedure: LEFT HEART CATH AND CORONARY ANGIOGRAPHY;  Surgeon: Wellington Hampshire, MD;  Location: [facility];  Service: Cardiovascular;  Laterality: N/A;   MAXILLARY ANTROSTOMY Right 07/19/2021   Procedure: MAXILLARY ANTROSTOMY WITH REMOVAL TISSUE;  Surgeon: Margaretha Sheffield, MD;  Location: [facility];  Service: ENT;  Laterality:  Right;   MINOR HEMORRHOIDECTOMY     NASAL TURBINATE REDUCTION Bilateral 07/19/2021   Procedure: INFERIOR TURBINATE REDUCTION;  Surgeon: Margaretha Sheffield, MD;  Location: [facility];  Service: ENT;  Laterality: Bilateral;   SEPTOPLASTY N/A 07/19/2021   Procedure: SEPTOPLASTY;  Surgeon: Margaretha Sheffield, MD;  Location: [facility];  Service: ENT;  Laterality: N/A;   TONSILLECTOMY     TOTAL ABDOMINAL HYSTERECTOMY     TRACHELECTOMY N/A 11/15/2014   Procedure: TRACHELECTOMY;  Surgeon: Rubie Maid, MD;  Location: [facility];  Service: Gynecology;  Laterality: N/A;   TRIGGER FINGER RELEASE Right    TUBAL LIGATION Bilateral     OB History     Gravida  3   Para  3   Term  3   Preterm      AB      Living  2      SAB      IAB      Ectopic      Multiple      Live Births  3        Obstetric Comments  1st Menstrual  Cycle:  12 1st Pregnancy:  21           Home Medications    Prior to Admission medications   Medication Sig Start Date End Date Taking? Authorizing Provider  Acetaminophen (TYLENOL ARTHRITIS PAIN PO) Take by mouth.    [provider]  celecoxib (CELEBREX) 100 MG capsule Take 100 mg by mouth 2 (two) times daily. 04/06/21   [provider]  cephALEXin (KEFLEX) 500 MG capsule Take 1 capsule (500 mg total) by mouth 2 (two) times daily. 07/19/21   Margaretha Sheffield, MD  cyclobenzaprine (FLEXERIL) 10 MG tablet Take 10 mg by mouth 3 (three) times daily as needed for muscle spasms.    [provider]  esomeprazole (NEXIUM) 40 MG capsule Take 40 mg by mouth every morning.     [provider]  fluticasone (FLONASE) 50 MCG/ACT nasal spray Place 1 spray into both nostrils daily.  09/25/19   [provider]  furosemide (LASIX) 20 MG tablet Take 1 tablet (20 mg total) by mouth daily. *PATIENT OCCASIONALLY TAKES AN EXTRA TABLET* Patient taking differently: Take 20 mg by mouth daily as needed for edema. *PATIENT OCCASIONALLY TAKES AN EXTRA TABLET*  10/27/20   Wellington Hampshire, MD  gabapentin (NEURONTIN) 100 MG capsule Take 100 mg by mouth 3 (three) times daily. 02/16/21   [provider]  JARDIANCE 10 MG TABS tablet TAKE 1 TABLET BY MOUTH ONCE DAILY BEFORE BREAKFAST 05/08/21   Wellington Hampshire, MD  levothyroxine (SYNTHROID) 125 MCG tablet Take 125 mcg by mouth every morning. 02/27/21   [provider]  losartan (COZAAR) 25 MG tablet Take 1/2 (one-half) tablet by mouth once daily 06/19/21   Wellington Hampshire, MD  metoprolol succinate (TOPROL-XL) 50 MG 24 hr tablet TAKE 1 TABLET BY MOUTH ONCE DAILY WITH  OR  IMMEDIATELY  FOLLOWING  A  MEAL 09/27/21   Wellington Hampshire, MD  nystatin (MYCOSTATIN) powder Apply 1 Bottle topically 4 (four) times daily as needed (irritaton). Reported on 08/18/2015    [provider]  nystatin cream (MYCOSTATIN) Apply topically 2 (two) times daily. 03/19/20   [provider]  oxyCODONE-acetaminophen (PERCOCET/ROXICET) 5-325 MG tablet Take by mouth every 4 (four) hours as needed for severe pain.    [provider]  predniSONE (DELTASONE) 10 MG tablet Start with 3 pills tomorrow. Taper over the next 6 days.  3,3,2,2,1,1. 07/19/21   Margaretha Sheffield, MD  sertraline (ZOLOFT) 100 MG tablet Take 100 mg by mouth daily. 03/04/20   [provider]  spironolactone (ALDACTONE) 25 MG tablet Take 1/2 (one-half) tablet by mouth once daily 09/27/20   Wellington Hampshire, MD  terbinafine (LAMISIL) 250 MG tablet Take 250 mg by mouth daily. 04/13/21   [provider]    Family History Family History  Problem Relation Age of Onset   Hypertension Mother    Hyperlipidemia Mother    Stroke Mother    Colon cancer Mother    Breast cancer Sister 43   Ovarian cancer Sister    Throat cancer Brother     Social History Social History   Tobacco Use   Smoking status: Never   Smokeless tobacco: Never  Vaping Use   Vaping Use: Never used  Substance Use Topics   Alcohol use: Yes     Alcohol/week: 0.0 standard drinks of alcohol    Comment: occassionally   Drug use: No     Allergies   Sulfa antibiotics and Sulfamethoxazole-trimethoprim   Review  of Systems Review of Systems  Skin:  Positive for rash.     Physical Exam Triage Vital Signs ED Triage Vitals  Enc Vitals Group     BP 04/18/22 1722 (!) 141/95     Pulse Rate 04/18/22 1722 82     Resp 04/18/22 1722 16     Temp 04/18/22 1722 97.9 F (36.6 C)     Temp Source 04/18/22 1722 Temporal     SpO2 04/18/22 1722 98 %     Weight --      Height --      Head Circumference --      Peak Flow --      Pain Score 04/18/22 1720 10     Pain Loc --      Pain Edu? --      Excl. in [place]? --    No data found.  Updated Vital Signs BP (!) 141/95 (BP Location: Left Arm)   Pulse 82   Temp 97.9 F (36.6 C) (Temporal)   Resp 16   SpO2 98%   Visual Acuity Right Eye Distance:   Left Eye Distance:   Bilateral Distance:    Right Eye Near:   Left Eye Near:    Bilateral Near:     Physical Exam Vitals reviewed.  Constitutional:      Appearance: Normal appearance.  Skin:    General: Skin is warm and dry.     Findings: Rash present.       Neurological:     General: No focal deficit present.     Mental Status: She is alert and oriented to person, place, and time.  Psychiatric:        Mood and Affect: Mood normal.        Behavior: Behavior normal.      UC Treatments / Results  Labs (all labs ordered are listed, but only abnormal results are displayed) Labs Reviewed - No data to display  EKG   Radiology No results found.  Procedures Procedures (including critical care time)  Medications Ordered in UC Medications - No data to display  Initial Impression / Assessment and Plan / UC Course  I have reviewed the triage vital signs and the nursing notes.  Pertinent labs & imaging results that were available during my care of the patient were reviewed by me and considered in my medical decision  making (see chart for details).   Patient presents with multiple erythematous patches on her anterior abdomen that are very tender to palpation.  Some of these have a classical linear pattern reminiscent of a parasitic infestation such as scabies.  Additional patches are spread on her upper chest flanks bilaterally and upper thighs.  These are also linear in morphology, but not tender to palpation.  She denies itching.  Unclear etiology for her symptoms but suggestive of parasitic infestation such as scabies.  Will treat her with permethrin.  Will also prescribe a stronger topical corticosteroid to be applied to each of the lesions.  Final Clinical Impressions(s) / UC Diagnoses   Final diagnoses:  None   Discharge Instructions   None    ED Prescriptions   None    PDMP not reviewed this encounter.   Rose Phi, [place] 04/18/22 1744

## 2022-04-19 DIAGNOSIS — L538 Other specified erythematous conditions: Secondary | ICD-10-CM | POA: Diagnosis not present

## 2022-04-23 DIAGNOSIS — M170 Bilateral primary osteoarthritis of knee: Secondary | ICD-10-CM | POA: Diagnosis not present

## 2022-05-08 DIAGNOSIS — Z7982 Long term (current) use of aspirin: Secondary | ICD-10-CM | POA: Diagnosis not present

## 2022-05-08 DIAGNOSIS — R0789 Other chest pain: Secondary | ICD-10-CM | POA: Diagnosis not present

## 2022-05-08 DIAGNOSIS — Z95810 Presence of automatic (implantable) cardiac defibrillator: Secondary | ICD-10-CM | POA: Diagnosis not present

## 2022-05-08 DIAGNOSIS — M549 Dorsalgia, unspecified: Secondary | ICD-10-CM | POA: Diagnosis not present

## 2022-05-08 DIAGNOSIS — I428 Other cardiomyopathies: Secondary | ICD-10-CM | POA: Diagnosis not present

## 2022-05-08 DIAGNOSIS — I110 Hypertensive heart disease with heart failure: Secondary | ICD-10-CM | POA: Diagnosis not present

## 2022-05-08 DIAGNOSIS — G894 Chronic pain syndrome: Secondary | ICD-10-CM | POA: Diagnosis not present

## 2022-05-08 DIAGNOSIS — G4733 Obstructive sleep apnea (adult) (pediatric): Secondary | ICD-10-CM | POA: Diagnosis not present

## 2022-05-08 DIAGNOSIS — I5022 Chronic systolic (congestive) heart failure: Secondary | ICD-10-CM | POA: Diagnosis not present

## 2022-05-08 DIAGNOSIS — R079 Chest pain, unspecified: Secondary | ICD-10-CM | POA: Diagnosis not present

## 2022-05-09 DIAGNOSIS — R0789 Other chest pain: Secondary | ICD-10-CM | POA: Diagnosis not present

## 2022-05-16 DIAGNOSIS — Z8659 Personal history of other mental and behavioral disorders: Secondary | ICD-10-CM | POA: Diagnosis not present

## 2022-05-16 DIAGNOSIS — I420 Dilated cardiomyopathy: Secondary | ICD-10-CM | POA: Diagnosis not present

## 2022-05-16 DIAGNOSIS — I429 Cardiomyopathy, unspecified: Secondary | ICD-10-CM | POA: Diagnosis not present

## 2022-05-16 DIAGNOSIS — Z95810 Presence of automatic (implantable) cardiac defibrillator: Secondary | ICD-10-CM | POA: Diagnosis not present

## 2022-05-16 DIAGNOSIS — M533 Sacrococcygeal disorders, not elsewhere classified: Secondary | ICD-10-CM | POA: Diagnosis not present

## 2022-05-16 DIAGNOSIS — I5022 Chronic systolic (congestive) heart failure: Secondary | ICD-10-CM | POA: Diagnosis not present

## 2022-06-05 DIAGNOSIS — I519 Heart disease, unspecified: Secondary | ICD-10-CM | POA: Diagnosis not present

## 2022-06-05 DIAGNOSIS — I5032 Chronic diastolic (congestive) heart failure: Secondary | ICD-10-CM | POA: Diagnosis not present

## 2022-06-05 DIAGNOSIS — I5022 Chronic systolic (congestive) heart failure: Secondary | ICD-10-CM | POA: Diagnosis not present

## 2022-06-05 DIAGNOSIS — I420 Dilated cardiomyopathy: Secondary | ICD-10-CM | POA: Diagnosis not present

## 2022-06-19 DIAGNOSIS — I4729 Other ventricular tachycardia: Secondary | ICD-10-CM | POA: Diagnosis not present

## 2022-06-19 DIAGNOSIS — I5020 Unspecified systolic (congestive) heart failure: Secondary | ICD-10-CM | POA: Diagnosis not present

## 2022-06-19 DIAGNOSIS — Z95810 Presence of automatic (implantable) cardiac defibrillator: Secondary | ICD-10-CM | POA: Diagnosis not present

## 2022-06-19 DIAGNOSIS — R002 Palpitations: Secondary | ICD-10-CM | POA: Diagnosis not present

## 2022-06-19 DIAGNOSIS — I519 Heart disease, unspecified: Secondary | ICD-10-CM | POA: Diagnosis not present

## 2022-06-19 DIAGNOSIS — Z79899 Other long term (current) drug therapy: Secondary | ICD-10-CM | POA: Diagnosis not present

## 2022-06-19 DIAGNOSIS — I428 Other cardiomyopathies: Secondary | ICD-10-CM | POA: Diagnosis not present

## 2022-06-27 ENCOUNTER — Other Ambulatory Visit: Payer: Self-pay

## 2022-06-27 DIAGNOSIS — Z1231 Encounter for screening mammogram for malignant neoplasm of breast: Secondary | ICD-10-CM

## 2022-07-18 DIAGNOSIS — M533 Sacrococcygeal disorders, not elsewhere classified: Secondary | ICD-10-CM | POA: Diagnosis not present

## 2022-07-18 DIAGNOSIS — M545 Low back pain, unspecified: Secondary | ICD-10-CM | POA: Diagnosis not present

## 2022-07-18 DIAGNOSIS — Z79899 Other long term (current) drug therapy: Secondary | ICD-10-CM | POA: Diagnosis not present

## 2022-07-18 DIAGNOSIS — R202 Paresthesia of skin: Secondary | ICD-10-CM | POA: Diagnosis not present

## 2022-07-18 DIAGNOSIS — M47817 Spondylosis without myelopathy or radiculopathy, lumbosacral region: Secondary | ICD-10-CM | POA: Diagnosis not present

## 2022-07-18 DIAGNOSIS — M79673 Pain in unspecified foot: Secondary | ICD-10-CM | POA: Diagnosis not present

## 2022-07-18 DIAGNOSIS — G894 Chronic pain syndrome: Secondary | ICD-10-CM | POA: Diagnosis not present

## 2022-08-17 DIAGNOSIS — M533 Sacrococcygeal disorders, not elsewhere classified: Secondary | ICD-10-CM | POA: Diagnosis not present

## 2022-08-20 DIAGNOSIS — E039 Hypothyroidism, unspecified: Secondary | ICD-10-CM | POA: Diagnosis not present

## 2022-09-11 DIAGNOSIS — I420 Dilated cardiomyopathy: Secondary | ICD-10-CM | POA: Diagnosis not present

## 2022-09-13 DIAGNOSIS — J309 Allergic rhinitis, unspecified: Secondary | ICD-10-CM | POA: Diagnosis not present

## 2022-09-13 DIAGNOSIS — I10 Essential (primary) hypertension: Secondary | ICD-10-CM | POA: Diagnosis not present

## 2022-09-13 DIAGNOSIS — M7732 Calcaneal spur, left foot: Secondary | ICD-10-CM | POA: Diagnosis not present

## 2022-09-13 DIAGNOSIS — T148XXA Other injury of unspecified body region, initial encounter: Secondary | ICD-10-CM | POA: Diagnosis not present

## 2022-09-13 DIAGNOSIS — R600 Localized edema: Secondary | ICD-10-CM | POA: Diagnosis not present

## 2022-09-13 DIAGNOSIS — M1611 Unilateral primary osteoarthritis, right hip: Secondary | ICD-10-CM | POA: Diagnosis not present

## 2022-09-13 DIAGNOSIS — E0590 Thyrotoxicosis, unspecified without thyrotoxic crisis or storm: Secondary | ICD-10-CM | POA: Diagnosis not present

## 2022-09-13 DIAGNOSIS — F329 Major depressive disorder, single episode, unspecified: Secondary | ICD-10-CM | POA: Diagnosis not present

## 2022-09-13 DIAGNOSIS — M25552 Pain in left hip: Secondary | ICD-10-CM | POA: Diagnosis not present

## 2022-09-13 DIAGNOSIS — M25572 Pain in left ankle and joints of left foot: Secondary | ICD-10-CM | POA: Diagnosis not present

## 2022-09-18 DIAGNOSIS — I10 Essential (primary) hypertension: Secondary | ICD-10-CM | POA: Diagnosis not present

## 2022-09-18 DIAGNOSIS — I519 Heart disease, unspecified: Secondary | ICD-10-CM | POA: Diagnosis not present

## 2022-09-18 DIAGNOSIS — I428 Other cardiomyopathies: Secondary | ICD-10-CM | POA: Diagnosis not present

## 2022-09-18 DIAGNOSIS — I5020 Unspecified systolic (congestive) heart failure: Secondary | ICD-10-CM | POA: Diagnosis not present

## 2022-09-18 DIAGNOSIS — I872 Venous insufficiency (chronic) (peripheral): Secondary | ICD-10-CM | POA: Diagnosis not present

## 2022-09-18 DIAGNOSIS — Z95810 Presence of automatic (implantable) cardiac defibrillator: Secondary | ICD-10-CM | POA: Diagnosis not present

## 2022-09-18 DIAGNOSIS — E782 Mixed hyperlipidemia: Secondary | ICD-10-CM | POA: Diagnosis not present

## 2022-09-26 DIAGNOSIS — M159 Polyosteoarthritis, unspecified: Secondary | ICD-10-CM | POA: Diagnosis not present

## 2022-09-27 DIAGNOSIS — M25572 Pain in left ankle and joints of left foot: Secondary | ICD-10-CM | POA: Diagnosis not present

## 2022-10-02 ENCOUNTER — Inpatient Hospital Stay
Admission: EM | Admit: 2022-10-02 | Discharge: 2022-10-04 | DRG: 164 | Disposition: A | Discharge status: Home / Self Care | Payer: Medicare HMO | Attending: Internal Medicine | Admitting: Internal Medicine

## 2022-10-02 ENCOUNTER — Emergency Department: Payer: Medicare HMO

## 2022-10-02 ENCOUNTER — Other Ambulatory Visit: Payer: Self-pay

## 2022-10-02 ENCOUNTER — Encounter: Payer: Self-pay | Admitting: Emergency Medicine

## 2022-10-02 DIAGNOSIS — G4733 Obstructive sleep apnea (adult) (pediatric): Secondary | ICD-10-CM | POA: Diagnosis present

## 2022-10-02 DIAGNOSIS — Z8616 Personal history of COVID-19: Secondary | ICD-10-CM

## 2022-10-02 DIAGNOSIS — D6959 Other secondary thrombocytopenia: Secondary | ICD-10-CM | POA: Diagnosis present

## 2022-10-02 DIAGNOSIS — F419 Anxiety disorder, unspecified: Secondary | ICD-10-CM | POA: Diagnosis present

## 2022-10-02 DIAGNOSIS — I214 Non-ST elevation (NSTEMI) myocardial infarction: Secondary | ICD-10-CM | POA: Diagnosis not present

## 2022-10-02 DIAGNOSIS — Z6839 Body mass index (BMI) 39.0-39.9, adult: Secondary | ICD-10-CM

## 2022-10-02 DIAGNOSIS — E039 Hypothyroidism, unspecified: Secondary | ICD-10-CM | POA: Diagnosis not present

## 2022-10-02 DIAGNOSIS — E876 Hypokalemia: Secondary | ICD-10-CM | POA: Diagnosis not present

## 2022-10-02 DIAGNOSIS — I200 Unstable angina: Secondary | ICD-10-CM | POA: Diagnosis not present

## 2022-10-02 DIAGNOSIS — F32A Depression, unspecified: Secondary | ICD-10-CM | POA: Diagnosis not present

## 2022-10-02 DIAGNOSIS — Z79890 Hormone replacement therapy (postmenopausal): Secondary | ICD-10-CM

## 2022-10-02 DIAGNOSIS — R600 Localized edema: Secondary | ICD-10-CM | POA: Diagnosis not present

## 2022-10-02 DIAGNOSIS — Z9889 Other specified postprocedural states: Secondary | ICD-10-CM

## 2022-10-02 DIAGNOSIS — Z8249 Family history of ischemic heart disease and other diseases of the circulatory system: Secondary | ICD-10-CM

## 2022-10-02 DIAGNOSIS — Z83438 Family history of other disorder of lipoprotein metabolism and other lipidemia: Secondary | ICD-10-CM

## 2022-10-02 DIAGNOSIS — E119 Type 2 diabetes mellitus without complications: Secondary | ICD-10-CM | POA: Diagnosis not present

## 2022-10-02 DIAGNOSIS — I2609 Other pulmonary embolism with acute cor pulmonale: Secondary | ICD-10-CM | POA: Diagnosis not present

## 2022-10-02 DIAGNOSIS — R079 Chest pain, unspecified: Principal | ICD-10-CM

## 2022-10-02 DIAGNOSIS — Z882 Allergy status to sulfonamides status: Secondary | ICD-10-CM

## 2022-10-02 DIAGNOSIS — E7800 Pure hypercholesterolemia, unspecified: Secondary | ICD-10-CM | POA: Diagnosis present

## 2022-10-02 DIAGNOSIS — Z8041 Family history of malignant neoplasm of ovary: Secondary | ICD-10-CM

## 2022-10-02 DIAGNOSIS — G894 Chronic pain syndrome: Secondary | ICD-10-CM | POA: Diagnosis present

## 2022-10-02 DIAGNOSIS — R0902 Hypoxemia: Secondary | ICD-10-CM | POA: Diagnosis present

## 2022-10-02 DIAGNOSIS — Z7901 Long term (current) use of anticoagulants: Secondary | ICD-10-CM

## 2022-10-02 DIAGNOSIS — Z7984 Long term (current) use of oral hypoglycemic drugs: Secondary | ICD-10-CM | POA: Diagnosis not present

## 2022-10-02 DIAGNOSIS — E782 Mixed hyperlipidemia: Secondary | ICD-10-CM | POA: Diagnosis present

## 2022-10-02 DIAGNOSIS — I10 Essential (primary) hypertension: Secondary | ICD-10-CM | POA: Diagnosis present

## 2022-10-02 DIAGNOSIS — Z90710 Acquired absence of both cervix and uterus: Secondary | ICD-10-CM

## 2022-10-02 DIAGNOSIS — K219 Gastro-esophageal reflux disease without esophagitis: Secondary | ICD-10-CM | POA: Diagnosis present

## 2022-10-02 DIAGNOSIS — R7989 Other specified abnormal findings of blood chemistry: Secondary | ICD-10-CM | POA: Insufficient documentation

## 2022-10-02 DIAGNOSIS — Z79899 Other long term (current) drug therapy: Secondary | ICD-10-CM

## 2022-10-02 DIAGNOSIS — I2699 Other pulmonary embolism without acute cor pulmonale: Secondary | ICD-10-CM | POA: Diagnosis not present

## 2022-10-02 DIAGNOSIS — I420 Dilated cardiomyopathy: Secondary | ICD-10-CM | POA: Diagnosis present

## 2022-10-02 DIAGNOSIS — I82812 Embolism and thrombosis of superficial veins of left lower extremities: Secondary | ICD-10-CM | POA: Diagnosis not present

## 2022-10-02 DIAGNOSIS — R000 Tachycardia, unspecified: Secondary | ICD-10-CM | POA: Diagnosis not present

## 2022-10-02 DIAGNOSIS — I5023 Acute on chronic systolic (congestive) heart failure: Secondary | ICD-10-CM | POA: Diagnosis not present

## 2022-10-02 DIAGNOSIS — Z8679 Personal history of other diseases of the circulatory system: Secondary | ICD-10-CM

## 2022-10-02 DIAGNOSIS — Z9851 Tubal ligation status: Secondary | ICD-10-CM

## 2022-10-02 DIAGNOSIS — Z791 Long term (current) use of non-steroidal anti-inflammatories (NSAID): Secondary | ICD-10-CM

## 2022-10-02 DIAGNOSIS — Z800 Family history of malignant neoplasm of digestive organs: Secondary | ICD-10-CM

## 2022-10-02 DIAGNOSIS — R0789 Other chest pain: Secondary | ICD-10-CM | POA: Diagnosis not present

## 2022-10-02 DIAGNOSIS — I5042 Chronic combined systolic (congestive) and diastolic (congestive) heart failure: Secondary | ICD-10-CM | POA: Diagnosis not present

## 2022-10-02 DIAGNOSIS — Z9089 Acquired absence of other organs: Secondary | ICD-10-CM

## 2022-10-02 DIAGNOSIS — M1990 Unspecified osteoarthritis, unspecified site: Secondary | ICD-10-CM | POA: Diagnosis present

## 2022-10-02 DIAGNOSIS — Z881 Allergy status to other antibiotic agents status: Secondary | ICD-10-CM

## 2022-10-02 DIAGNOSIS — E871 Hypo-osmolality and hyponatremia: Secondary | ICD-10-CM | POA: Diagnosis not present

## 2022-10-02 DIAGNOSIS — Z803 Family history of malignant neoplasm of breast: Secondary | ICD-10-CM

## 2022-10-02 DIAGNOSIS — Z823 Family history of stroke: Secondary | ICD-10-CM

## 2022-10-02 DIAGNOSIS — Z9861 Coronary angioplasty status: Secondary | ICD-10-CM

## 2022-10-02 DIAGNOSIS — Z90722 Acquired absence of ovaries, bilateral: Secondary | ICD-10-CM

## 2022-10-02 DIAGNOSIS — Z95810 Presence of automatic (implantable) cardiac defibrillator: Secondary | ICD-10-CM

## 2022-10-02 DIAGNOSIS — Z808 Family history of malignant neoplasm of other organs or systems: Secondary | ICD-10-CM

## 2022-10-02 DIAGNOSIS — I110 Hypertensive heart disease with heart failure: Secondary | ICD-10-CM | POA: Diagnosis present

## 2022-10-02 DIAGNOSIS — G4730 Sleep apnea, unspecified: Secondary | ICD-10-CM | POA: Diagnosis present

## 2022-10-02 LAB — BASIC METABOLIC PANEL
Anion gap: 9 (ref 5–15)
BUN: 11 mg/dL (ref 6–20)
CO2: 28 mmol/L (ref 22–32)
Calcium: 9.6 mg/dL (ref 8.9–10.3)
Chloride: 105 mmol/L (ref 98–111)
Creatinine, Ser: 0.99 mg/dL (ref 0.44–1.00)
GFR, Estimated: >60 mL/min (ref >60)
Glucose, Bld: 110 mg/dL — ABNORMAL HIGH (ref 70–99)
Potassium: 3.6 mmol/L (ref 3.5–5.1)
Sodium: 142 mmol/L (ref 135–145)

## 2022-10-02 LAB — CBC
HCT: 45.8 % (ref 36.0–46.0)
Hemoglobin: 14.9 g/dL (ref 12.0–15.0)
MCH: 30.5 pg (ref 26.0–34.0)
MCHC: 32.5 g/dL (ref 30.0–36.0)
MCV: 93.7 fL (ref 80.0–100.0)
Platelets: 139 10*3/uL — ABNORMAL LOW (ref 150–400)
RBC: 4.89 MIL/uL (ref 3.87–5.11)
RDW: 13.8 % (ref 11.5–15.5)
WBC: 5.6 10*3/uL (ref 4.0–10.5)
nRBC: 0 % (ref 0.0–0.2)

## 2022-10-02 LAB — TROPONIN I (HIGH SENSITIVITY)
Troponin I (High Sensitivity): 52 ng/L — ABNORMAL HIGH (ref <18)
Troponin I (High Sensitivity): 126 ng/L (ref <18)

## 2022-10-02 MED ORDER — HEPARIN SODIUM (PORCINE) 5000 UNIT/ML IJ SOLN: 5000.0000 [IU] | Freq: Three times a day (TID) | INTRAMUSCULAR | Status: DC

## 2022-10-02 MED ORDER — PANTOPRAZOLE SODIUM 40 MG PO TBEC
80.0000 mg | DELAYED_RELEASE_TABLET | Freq: Every day | ORAL | Status: DC
  Administered 2022-10-03 – 2022-10-04 (×2): 80 mg via ORAL
  Filled 2022-10-02 (×2): qty 2

## 2022-10-02 MED ORDER — METOPROLOL TARTRATE 5 MG/5ML IV SOLN: 5.0000 mg | INTRAVENOUS | Status: AC | PRN

## 2022-10-02 MED ORDER — MORPHINE SULFATE (PF) 2 MG/ML IV SOLN
2.0000 mg | INTRAVENOUS | Status: AC | PRN
  Administered 2022-10-03: 2 mg via INTRAVENOUS
  Filled 2022-10-02: qty 1

## 2022-10-02 MED ORDER — NITROGLYCERIN 2 % TD OINT: 1.0000 [in_us] | TOPICAL_OINTMENT | Freq: Four times a day (QID) | TRANSDERMAL | Status: DC | PRN

## 2022-10-02 MED ORDER — ONDANSETRON HCL 4 MG/2ML IJ SOLN: 4.0000 mg | Freq: Four times a day (QID) | INTRAMUSCULAR | Status: DC | PRN

## 2022-10-02 MED ORDER — ACETAMINOPHEN 325 MG PO TABS: 650.0000 mg | ORAL_TABLET | Freq: Four times a day (QID) | ORAL | Status: DC | PRN

## 2022-10-02 MED ORDER — ASPIRIN 81 MG PO CHEW
324.0000 mg | CHEWABLE_TABLET | Freq: Once | ORAL | Status: AC
  Administered 2022-10-02: 324 mg via ORAL
  Filled 2022-10-02: qty 4

## 2022-10-02 MED ORDER — HEPARIN BOLUS VIA INFUSION
4000.0000 [IU] | Freq: Once | INTRAVENOUS | Status: AC
  Administered 2022-10-02: 4000 [IU] via INTRAVENOUS
  Filled 2022-10-02: qty 4000

## 2022-10-02 MED ORDER — ONDANSETRON HCL 4 MG PO TABS: 4.0000 mg | ORAL_TABLET | Freq: Four times a day (QID) | ORAL | Status: DC | PRN

## 2022-10-02 MED ORDER — ACETAMINOPHEN 650 MG RE SUPP: 650.0000 mg | Freq: Four times a day (QID) | RECTAL | Status: DC | PRN

## 2022-10-02 MED ORDER — LEVOTHYROXINE SODIUM 25 MCG PO TABS
125.0000 ug | ORAL_TABLET | Freq: Every day | ORAL | Status: DC
  Administered 2022-10-03 – 2022-10-04 (×2): 125 ug via ORAL
  Filled 2022-10-02 (×2): qty 1

## 2022-10-02 MED ORDER — OXYCODONE-ACETAMINOPHEN 5-325 MG PO TABS
1.0000 | ORAL_TABLET | ORAL | Status: DC | PRN
  Administered 2022-10-04: 1 via ORAL
  Filled 2022-10-02: qty 1

## 2022-10-02 MED ORDER — METOPROLOL SUCCINATE ER 50 MG PO TB24
50.0000 mg | ORAL_TABLET | Freq: Every day | ORAL | Status: DC
  Administered 2022-10-03 – 2022-10-04 (×2): 50 mg via ORAL
  Filled 2022-10-02 (×2): qty 1

## 2022-10-02 MED ORDER — CYCLOBENZAPRINE HCL 10 MG PO TABS: 10.0000 mg | ORAL_TABLET | Freq: Three times a day (TID) | ORAL | Status: DC | PRN

## 2022-10-02 MED ORDER — SPIRONOLACTONE 12.5 MG HALF TABLET
12.5000 mg | ORAL_TABLET | Freq: Every day | ORAL | Status: DC
  Administered 2022-10-03 – 2022-10-04 (×2): 12.5 mg via ORAL
  Filled 2022-10-02 (×2): qty 1

## 2022-10-02 MED ORDER — GABAPENTIN 100 MG PO CAPS
100.0000 mg | ORAL_CAPSULE | Freq: Every day | ORAL | Status: DC
  Administered 2022-10-03: 100 mg via ORAL
  Filled 2022-10-02: qty 1

## 2022-10-02 MED ORDER — SENNOSIDES-DOCUSATE SODIUM 8.6-50 MG PO TABS: 1.0000 | ORAL_TABLET | Freq: Every evening | ORAL | Status: DC | PRN

## 2022-10-02 MED ORDER — NITROGLYCERIN 0.4 MG SL SUBL: 0.4000 mg | SUBLINGUAL_TABLET | SUBLINGUAL | Status: DC | PRN

## 2022-10-02 MED ORDER — HEPARIN (PORCINE) 25000 UT/250ML-% IV SOLN
1200.0000 [IU]/h | INTRAVENOUS | Status: DC
  Administered 2022-10-02 – 2022-10-03 (×2): 1200 [IU]/h via INTRAVENOUS
  Filled 2022-10-02 (×2): qty 250

## 2022-10-02 MED ORDER — LORAZEPAM 2 MG/ML IJ SOLN: 0.5000 mg | Freq: Four times a day (QID) | INTRAMUSCULAR | Status: AC | PRN

## 2022-10-02 NOTE — Assessment & Plan Note (Signed)
Ativan 0.5 mg IV every 6 hours.  For anxiety, 1 day ordered

## 2022-10-02 NOTE — Progress Notes (Signed)
ANTICOAGULATION CONSULT NOTE  Pharmacy Consult for heparin infusion Indication: ACS/STEMI  Allergies  Allergen Reactions   Sulfa Antibiotics Hives   Sulfamethoxazole-Trimethoprim Hives    Patient Measurements: Height: 5\' 7"  (170.2 cm) Weight: 114.3 kg (252 lb) IBW/kg (Calculated) : 61.6 Heparin Dosing Weight: 88.2 kg  Vital Signs: Temp: 97.6 F (36.4 C) (09/03 1746) Temp Source: Oral (09/03 1746) BP: 138/104 (09/03 2230) Pulse Rate: 86 (09/03 2230)  Labs: Recent Labs    10/02/22 1748 10/02/22 2146  HGB 14.9  --   HCT 45.8  --   PLT 139*  --   CREATININE 0.99  --   TROPONINIHS 52* 126*    Estimated Creatinine Clearance: 78.9 mL/min (by C-G formula based on SCr of 0.99 mg/dL).   Medical History: Past Medical History:  Diagnosis Date   AICD (automatic cardioverter/defibrillator) present 2008   a.) Medtronic device placed 2008. b.) replaced 06/21/2013 (Medtronic Evera XT single-chamber ICD; model number [id number]).   Anemia    Anxiety    Aortic dilatation ([facility]) 03/24/2019   a.) TTE 03/24/2019: Ao root measured 37 mm. b.) TTE 03/21/2020: Ao root measured 37 mm.   Arthritis    Depression    Difficult intubation    DOE (dyspnea on exertion)    Endometriosis of vagina 09/2013   Intra-operative findings of endometriosis implants on cervical stump   Essential hypertension    GERD (gastroesophageal reflux disease)    Headache    HFrEF (heart failure with reduced ejection fraction) ([facility])    a.) TTE 02/18/2014: EF 40-45%; tiv AR, mild MR/TR/PR; G1DD. b.) TTE 02/22/2017: EF 45-50%; mild LVH; triv PR; G1DD. c.) TTE 03/24/2019: EF 45-50%; glob HK, mild LVH; triv MR/TR/PR. d.) TTE 03/21/2020: EF 40-45%; glob HK.   History of 2019 novel coronavirus disease (COVID-19) 02/02/2020   History of 2019 novel coronavirus disease (COVID-19) 02/02/2020   Hypokalemia    Hypothyroidism    a.) s/p radioactive iodine Tx; on levothyroxine   Mixed hyperlipidemia    Nonischemic  cardiomyopathy ([facility])    a.) TTE 02/18/2014: EF 40-45%. b.) TTE 02/22/2017: EF 45-50%. c.) TTE 03/24/2019: EF 45-50%. d.) TTE 03/21/2020: EF 40-45%   OSA on CPAP    Panic attacks    PSVT (paroxysmal supraventricular tachycardia) 03/30/2020   a.) Holter 03/30/2020 --> 2 runs; fastest lasting 4 beats (184 bpm); longest lasting 4 beats (113 bpm)   Tachycardia     Assessment: Pt is a 62 yo female with hx of multiple DVTs of the right upper extremity. CTA chest showed left lower lobe segmental and subsegmental pulmonary emboli without evidence of right heart strain. The patient was started on Eliquis. She completed several months of treatment of Eliquis, and it was recently discontinued per hematology as reported on 09/18/22. Pt presenting to ED c/o CP and SOB, found with elevated Troponin I level, trending up.  Goal of Therapy:  Heparin level 0.3-0.7 units/ml Monitor platelets by anticoagulation protocol: Yes   Plan: Ordered BL labs including HL, due to recent hx of Eliquis Bolus 4000 units x 1 Start heparin infusion at 1200 units/hr Will check HL in 6 hr after start of infusion CBC daily while on heparin  Otelia Sergeant, PharmD, [facility] 10/02/2022 11:04 PM

## 2022-10-02 NOTE — Assessment & Plan Note (Addendum)
-   Metoprolol succinate 50 mg daily resumed

## 2022-10-02 NOTE — ED Triage Notes (Signed)
Patient to ED via POV for chest pain and SOB. Pain radiates into the upper part of back. Started today- hx of CHF. Pt having dyspnea with exertion.

## 2022-10-02 NOTE — ED Provider Notes (Addendum)
[facility] Provider Note    Event Date/Time   First MD Initiated Contact with Patient 10/02/22 2114     (approximate)   History   Chief Complaint: Chest Pain   HPI  Lisa Fuentes is a 62 y.o. female with a history of hypertension hyperlipidemia heart failure and obesity who comes ED complaining of chest pain and shortness of breath, worse with exertion, better with rest.  Onset while at rest earlier today, persistent all day, feels like pressure.  Radiates to the back.  Not sharp or tearing in onset.  No acute lower extremity edema or calf pain.     Physical Exam   Triage Vital Signs: ED Triage Vitals  Encounter Vitals Group     BP 10/02/22 1746 (!) 134/97     Systolic BP Percentile --      Diastolic BP Percentile --      Pulse Rate 10/02/22 1746 (!) 101     Resp 10/02/22 1746 18     Temp 10/02/22 1746 97.6 F (36.4 C)     Temp Source 10/02/22 1746 Oral     SpO2 10/02/22 1746 94 %     Weight 10/02/22 1745 252 lb (114.3 kg)     Height 10/02/22 1745 5\' 7"  (1.702 m)     Head Circumference --      Peak Flow --      Pain Score 10/02/22 1744 2     Pain Loc --      Pain Education --      Exclude from Growth Chart --     Most recent vital signs: Vitals:   10/02/22 1746  BP: (!) 134/97  Pulse: (!) 101  Resp: 18  Temp: 97.6 F (36.4 C)  SpO2: 94%    General: Awake, no distress.  CV:  Good peripheral perfusion.  Regular rate and rhythm, normal distal pulses Resp:  Normal effort.  Clear to auscultation bilaterally Abd:  No distention.  Soft nontender Other:  No lower extremity edema, no calf tenderness   ED Results / Procedures / Treatments   Labs (all labs ordered are listed, but only abnormal results are displayed) Labs Reviewed  BASIC METABOLIC PANEL - Abnormal; Notable for the following components:      Result Value   Glucose, Bld 110 (*)    All other components within normal limits  CBC - Abnormal; Notable for the following  components:   Platelets 139 (*)    All other components within normal limits  TROPONIN I (HIGH SENSITIVITY) - Abnormal; Notable for the following components:   Troponin I (High Sensitivity) 52 (*)    All other components within normal limits  TROPONIN I (HIGH SENSITIVITY)     EKG Interpreted by me Sinus tachycardia rate 105.  Normal axis, normal intervals.  Poor R wave progression, normal ST segments and T waves.   RADIOLOGY Chest x-ray interpreted by me, appears normal.  Radiology report reviewed   PROCEDURES:  Procedures   MEDICATIONS ORDERED IN ED: Medications  nitroGLYCERIN (NITROSTAT) SL tablet 0.4 mg (has no administration in time range)  aspirin chewable tablet 324 mg (has no administration in time range)     IMPRESSION / MDM / ASSESSMENT AND PLAN / ED COURSE  I reviewed the triage vital signs and the nursing notes.  DDx: Pneumonia, pleural effusion, pulmonary edema, NSTEMI, anemia, electrolyte abnormality  Patient's presentation is most consistent with acute presentation with potential threat to life or bodily function.  Patient presents with exertional chest pain and shortness of breath starting today.  Currently having mild ongoing chest pressure.  Initial EKG and labs are all reassuring except for high-sensitivity troponin of 52 compared to prior baseline of normal.  Reviewing outside records, [facility] lab had high-sensitivity troponin level of 13 on May 08, 2022.  Will need to hospitalize for further cardiac workup.  Will give nitroglycerin and aspirin for now.  Outpatient cardiologist is Dr. Rudi Heap    ----------------------------------------- 9:40 PM on 10/02/2022 ----------------------------------------- Case discussed with hospitalist     FINAL CLINICAL IMPRESSION(S) / ED DIAGNOSES   Final diagnoses:  Chest pain with moderate risk for cardiac etiology     Rx / DC Orders   ED Discharge Orders     None        Note:  This  document was prepared using Dragon voice recognition software and may include unintentional dictation errors.   Sharman Cheek, MD 10/02/22 2131    Sharman Cheek, MD 10/02/22 2140

## 2022-10-02 NOTE — Assessment & Plan Note (Signed)
PDMP reviewed Patient takes oxycodone-acetaminophen 5/325 mg resume for 9/4; gabapentin 100 mg 3 times daily Home gabapentin resumed on admission Patient currently has morphine 2 mg IV every 4 hours as needed for severe pain, 12 hours coverage ordered

## 2022-10-02 NOTE — Assessment & Plan Note (Signed)
Complete ordered

## 2022-10-02 NOTE — Assessment & Plan Note (Addendum)
Reviewed cardiac echo on 03/21/2020: Estimated ejection fraction is 40 to 45%, grade 1 diastolic dysfunction Home spironolactone 12.5 mg daily, metoprolol succinate 50 mg daily resumed for 9/4

## 2022-10-02 NOTE — Hospital Course (Addendum)
Ms. Lisa Fuentes is a 62 year old female with history of hypertension, depression, GERD, hypothyroid, non-insulin-dependent diabetes mellitus, who presents emergency department for chief concerns of chest pain and shortness of breath. High sensitive troponin is 52.  CT angiogram showed a moderate amount of PE with right heart strain.  Heparin is continued.  Vascular surgery consulted.

## 2022-10-02 NOTE — ED Notes (Signed)
Pt sitting in lobby with no distress noted; pt expresses concern over bruising to lab draw site at left antecubital and st she "doesn't want to develop a blood clot"; small purplish bruise noted with no swelling; explained to pt that bruising is a common side effect of blood draw due to insertion of the needle and should be harmless; ice pack given to pt for comfort

## 2022-10-02 NOTE — Assessment & Plan Note (Signed)
High sensitive troponin on admission was 52 and on repeat was 126 Heparin per pharmacy ordered Complete echo ordered Admit to telemetry cardiac, inpatient

## 2022-10-02 NOTE — ED Notes (Signed)
Pt concerned about her R arm for BPs, and IVs as she has had a blood clot on her R arm.  Attempting to do all care on L side per pt but she is cooperative and understanding if her R arm needs to be used.

## 2022-10-02 NOTE — Assessment & Plan Note (Signed)
-  CPAP nightly ordered 

## 2022-10-02 NOTE — Assessment & Plan Note (Signed)
Does not appear to be in acute exacerbation however given patient presenting for chest pressure with elevated troponin, we will proceed with strict I's and O's and a complete echo

## 2022-10-02 NOTE — Assessment & Plan Note (Signed)
-   This complicates overall care and prognosis.  

## 2022-10-02 NOTE — Assessment & Plan Note (Signed)
Cardiology has been consulted via staff message to [facility] clinic group Complete echo ordered Nitroglycerin ointment 1 inch every 6 hours as needed for chest pain ordered; morphine 2 mg IV every 4 hours as needed for severe pain, 12 hours of coverage ordered Admit to telemetry cardiac, inpatient

## 2022-10-02 NOTE — H&P (Signed)
History and Physical   Lisa Fuentes BTD:[id number] DOB: 20-Apr-1960 DOA: 10/02/2022  PCP: Jerl Mina, MD  Patient coming from: Home via POV  I have personally briefly reviewed patient's old medical records in [facility] EMR.  Chief Concern: Chest pain, shortness of breath  HPI: Ms. Lisa Fuentes is a 62 year old female with history of hypertension, depression, GERD, hypothyroid, non-insulin-dependent diabetes mellitus, who presents emergency department for chief concerns of chest pain and shortness of breath.  Vitals in the ED showed temperature of 97.6, respiration rate 18, heart rate of 101, blood pressure 134/97, SpO2 94% on room air.  Serum Na 142, potassium 3.6, chloride 105, bicarb 28, BUN of 11, serum creatinine of 0.99, EGFR greater than 60, nonfasting blood glucose 110, WBC 5.6, hemoglobin 14.9, platelets of 139.  High sensitive troponin is 52.  ED treatment: Aspirin 324 mg p.o. one-time dose. --------------------------------- At bedside, patient was able to tell me her name, age, location, current calendar year.  At bedside, patient appears mildly anxious.  She reports that she was sitting and watching children playing and when she was about to get up she noticed chest pressure and shortness of breath.  She noticed that as she walked throughout the house, her chest pressure and shortness of breath worsens and minimally improves with rest.  She reports some discomfort radiation to her neck and right shoulder.  She reports that currently she is not having the same chest pressure she had with exertion.  She reports it still feels tight there.  She denies trauma to her person.  She denies fever, chills, abdominal pain, dysuria, hematuria, diarrhea, seeing any blood in her stool or her urine.  Social history: She lives at home with her husband.  She denies tobacco, EtOH, recreational drug use.  ROS: Constitutional: no weight change, no fever ENT/Mouth: no sore throat, no  rhinorrhea Eyes: no eye pain, no vision changes Cardiovascular: + chest pain, + dyspnea,  no edema, no palpitations Respiratory: no cough, no sputum, no wheezing Gastrointestinal: no nausea, no vomiting, no diarrhea, no constipation Genitourinary: no urinary incontinence, no dysuria, no hematuria Musculoskeletal: no arthralgias, no myalgias Skin: no skin lesions, no pruritus, Neuro: + weakness, no loss of consciousness, no syncope Psych: no anxiety, no depression, no decrease appetite Heme/Lymph: no bruising, no bleeding  ED Course: Discussed with emergency medicine provider, patient requiring hospitalization for chief concerns of shortness of breath and chest pain.  Assessment/Plan  Principal Problem:   Chest pain Active Problems:   Essential hypertension   Hypercholesteremia   ICD (implantable cardioverter-defibrillator) in place   Sleep apnea   Dilated cardiomyopathy (HCC)   Mixed hyperlipidemia   Anxiety   Chronic pain syndrome   Depression   HFrEF (heart failure with reduced ejection fraction) (HCC)   Morbid obesity (HCC)   Combined congestive systolic and diastolic heart failure (HCC)   NSTEMI (non-ST elevated myocardial infarction) (HCC)   Assessment and Plan:  * Chest pain Cardiology has been consulted via staff message to [facility] clinic group Complete echo ordered Nitroglycerin ointment 1 inch every 6 hours as needed for chest pain ordered; morphine 2 mg IV every 4 hours as needed for severe pain, 12 hours of coverage ordered Admit to telemetry cardiac, inpatient  NSTEMI (non-ST elevated myocardial infarction) (HCC) High sensitive troponin on admission was 52 and on repeat was 126 Heparin per pharmacy ordered Complete echo ordered Admit to telemetry cardiac, inpatient  Combined congestive systolic and diastolic heart failure (HCC) Reviewed cardiac echo on  03/21/2020: Estimated ejection fraction is 40 to 45%, grade 1 diastolic dysfunction Home spironolactone  12.5 mg daily, metoprolol succinate 50 mg daily resumed for 9/4  Morbid obesity (HCC) This complicates overall care and prognosis.   HFrEF (heart failure with reduced ejection fraction) (HCC) Does not appear to be in acute exacerbation however given patient presenting for chest pressure with elevated troponin, we will proceed with strict I's and O's and a complete echo  Chronic pain syndrome PDMP reviewed Patient takes oxycodone-acetaminophen 5/325 mg resume for 9/4; gabapentin 100 mg 3 times daily Home gabapentin resumed on admission Patient currently has morphine 2 mg IV every 4 hours as needed for severe pain, 12 hours coverage ordered  Anxiety Ativan 0.5 mg IV every 6 hours.  For anxiety, 1 day ordered  Dilated cardiomyopathy (HCC) Complete ordered  Sleep apnea CPAP nightly ordered  Essential hypertension Metoprolol succinate 50 mg daily resumed  Chart reviewed.   DVT prophylaxis: Heparin per pharmacy Code Status: Full code Diet: Heart healthy; n.p.o. after midnight Family Communication: A phone call was offered, patient declined Disposition Plan: Pending clinical course Consults called: Cardiology Admission status: Telemetry cardiac, inpatient  Past Medical History:  Diagnosis Date   AICD (automatic cardioverter/defibrillator) present 2008   a.) Medtronic device placed 2008. b.) replaced 06/21/2013 (Medtronic Evera XT single-chamber ICD; model number [id number]).   Anemia    Anxiety    Aortic dilatation (HCC) 03/24/2019   a.) TTE 03/24/2019: Ao root measured 37 mm. b.) TTE 03/21/2020: Ao root measured 37 mm.   Arthritis    Depression    Difficult intubation    DOE (dyspnea on exertion)    Endometriosis of vagina 09/2013   Intra-operative findings of endometriosis implants on cervical stump   Essential hypertension    GERD (gastroesophageal reflux disease)    Headache    HFrEF (heart failure with reduced ejection fraction) (HCC)    a.) TTE 02/18/2014: EF  40-45%; tiv AR, mild MR/TR/PR; G1DD. b.) TTE 02/22/2017: EF 45-50%; mild LVH; triv PR; G1DD. c.) TTE 03/24/2019: EF 45-50%; glob HK, mild LVH; triv MR/TR/PR. d.) TTE 03/21/2020: EF 40-45%; glob HK.   History of 2019 novel coronavirus disease (COVID-19) 02/02/2020   History of 2019 novel coronavirus disease (COVID-19) 02/02/2020   Hypokalemia    Hypothyroidism    a.) s/p radioactive iodine Tx; on levothyroxine   Mixed hyperlipidemia    Nonischemic cardiomyopathy (HCC)    a.) TTE 02/18/2014: EF 40-45%. b.) TTE 02/22/2017: EF 45-50%. c.) TTE 03/24/2019: EF 45-50%. d.) TTE 03/21/2020: EF 40-45%   OSA on CPAP    Panic attacks    PSVT (paroxysmal supraventricular tachycardia) 03/30/2020   a.) Holter 03/30/2020 --> 2 runs; fastest lasting 4 beats (184 bpm); longest lasting 4 beats (113 bpm)   Tachycardia    Past Surgical History:  Procedure Laterality Date   ANTERIOR AND POSTERIOR REPAIR N/A 11/25/2017   Procedure: ANTERIOR (CYSTOCELE) AND POSTERIOR REPAIR (RECTOCELE);  Surgeon: Hildred Laser, MD;  Location: [facility] ORS;  Service: Gynecology;  Laterality: N/A;   BREAST CYST ASPIRATION Left 03/22/2014   neg/ done by Dr Evette Cristal   CARDIAC CATHETERIZATION  2003   [facility]: No significant coronary artery disease with reduced ejection fraction.   CARDIAC DEFIBRILLATOR PLACEMENT  04/2006   replaced 05/2013   CARDIAC DEFIBRILLATOR PLACEMENT  06/21/2013   Procedure:  CARDIAC DEFIBRILLATOR PLACEMENT (Medtronic single-chamber ICD, model number Evera XT, model number [id number]): Location: [facility]; Surgeon: Gerre Pebbles, MD   CARPAL TUNNEL RELEASE Right  COLONOSCOPY  2015   COLONOSCOPY N/A 10/13/2020   Procedure: COLONOSCOPY;  Surgeon: Jaynie Collins, DO;  Location: [facility] ENDOSCOPY;  Service: Gastroenterology;  Laterality: N/A;   COLONOSCOPY WITH PROPOFOL N/A 10/04/2015   Procedure: COLONOSCOPY WITH PROPOFOL;  Surgeon: Midge Minium, MD;  Location: [facility] ENDOSCOPY;  Service: Endoscopy;  Laterality: N/A;    CORONARY ANGIOPLASTY     CYSTOSCOPY  11/15/2014   Procedure: CYSTOSCOPY;  Surgeon: Hildred Laser, MD;  Location: [facility] ORS;  Service: Gynecology;;   DIAGNOSTIC LAPAROSCOPY     ETHMOIDECTOMY Right 07/19/2021   Procedure: TOTAL ETHMOIDECTOMY WITH FRONTAL SINUS EXPLORATION;  Surgeon: Vernie Murders, MD;  Location: [facility] ORS;  Service: ENT;  Laterality: Right;   FOOT SURGERY Bilateral    heer spur and bunion   IMAGE GUIDED SINUS SURGERY N/A 07/19/2021   Procedure: IMAGE GUIDED SINUS SURGERY;  Surgeon: Vernie Murders, MD;  Location: [facility] ORS;  Service: ENT;  Laterality: N/A;   KNEE ARTHROSCOPY Left 03/02/2015   Procedure: ARTHROSCOPY  LEFT KNEE, PARTIAL LATERAL  MENISECTOMY, SYNOVECTOMY, MEDIAL & LATERAL CHONDROPLASTY;  Surgeon: Juanell Fairly, MD;  Location: [facility] ORS;  Service: Orthopedics;  Laterality: Left;   LAPAROSCOPIC SALPINGO OOPHERECTOMY Left 11/15/2014   Procedure: LAPAROSCOPIC OOPHORECTOMY;  Surgeon: Hildred Laser, MD;  Location: [facility] ORS;  Service: Gynecology;  Laterality: Left;   LAPAROSCOPIC SALPINGOOPHERECTOMY Right 09/2013   also with left salpingectomy   LEFT HEART CATH AND CORONARY ANGIOGRAPHY N/A 03/28/2020   Procedure: LEFT HEART CATH AND CORONARY ANGIOGRAPHY;  Surgeon: Iran Ouch, MD;  Location: [facility] INVASIVE CV LAB;  Service: Cardiovascular;  Laterality: N/A;   MAXILLARY ANTROSTOMY Right 07/19/2021   Procedure: MAXILLARY ANTROSTOMY WITH REMOVAL TISSUE;  Surgeon: Vernie Murders, MD;  Location: [facility] ORS;  Service: ENT;  Laterality: Right;   MINOR HEMORRHOIDECTOMY     NASAL TURBINATE REDUCTION Bilateral 07/19/2021   Procedure: INFERIOR TURBINATE REDUCTION;  Surgeon: Vernie Murders, MD;  Location: [facility] ORS;  Service: ENT;  Laterality: Bilateral;   SEPTOPLASTY N/A 07/19/2021   Procedure: SEPTOPLASTY;  Surgeon: Vernie Murders, MD;  Location: [facility] ORS;  Service: ENT;  Laterality: N/A;   TONSILLECTOMY     TOTAL ABDOMINAL HYSTERECTOMY     TRACHELECTOMY N/A 11/15/2014   Procedure:  TRACHELECTOMY;  Surgeon: Hildred Laser, MD;  Location: [facility] ORS;  Service: Gynecology;  Laterality: N/A;   TRIGGER FINGER RELEASE Right    TUBAL LIGATION Bilateral    Social History:  reports that she has never smoked. She has never used smokeless tobacco. She reports current alcohol use. She reports that she does not use drugs.  Allergies  Allergen Reactions   Sulfa Antibiotics Hives   Sulfamethoxazole-Trimethoprim Hives   Family History  Problem Relation Age of Onset   Hypertension Mother    Hyperlipidemia Mother    Stroke Mother    Colon cancer Mother    Breast cancer Sister 51   Ovarian cancer Sister    Throat cancer Brother    Family history: Family history reviewed and not pertinent  Prior to Admission medications   Medication Sig Start Date End Date Taking? Authorizing Provider  Acetaminophen (TYLENOL ARTHRITIS PAIN PO) Take by mouth.    [provider]  celecoxib (CELEBREX) 100 MG capsule Take 100 mg by mouth 2 (two) times daily. 04/06/21   [provider]  cephALEXin (KEFLEX) 500 MG capsule Take 1 capsule (500 mg total) by mouth 2 (two) times daily. 07/19/21   Vernie Murders, MD  cyclobenzaprine (FLEXERIL) 10 MG tablet Take 10 mg by  mouth 3 (three) times daily as needed for muscle spasms.    [provider]  esomeprazole (NEXIUM) 40 MG capsule Take 40 mg by mouth every morning.     [provider]  fluocinonide-emollient (LIDEX-E) 0.05 % cream Apply 1 Application topically 2 (two) times daily. 04/18/22   Immordino, Jeannett Senior, FNP  fluticasone (FLONASE) 50 MCG/ACT nasal spray Place 1 spray into both nostrils daily.  09/25/19   [provider]  furosemide (LASIX) 20 MG tablet Take 1 tablet (20 mg total) by mouth daily. *PATIENT OCCASIONALLY TAKES AN EXTRA TABLET* Patient taking differently: Take 20 mg by mouth daily as needed for edema. *PATIENT OCCASIONALLY TAKES AN EXTRA TABLET* 10/27/20   Iran Ouch, MD  gabapentin (NEURONTIN) 100  MG capsule Take 100 mg by mouth 3 (three) times daily. 02/16/21   [provider]  JARDIANCE 10 MG TABS tablet TAKE 1 TABLET BY MOUTH ONCE DAILY BEFORE BREAKFAST 05/08/21   Iran Ouch, MD  levothyroxine (SYNTHROID) 125 MCG tablet Take 125 mcg by mouth every morning. 02/27/21   [provider]  losartan (COZAAR) 25 MG tablet Take 1/2 (one-half) tablet by mouth once daily 06/19/21   Iran Ouch, MD  metoprolol succinate (TOPROL-XL) 50 MG 24 hr tablet TAKE 1 TABLET BY MOUTH ONCE DAILY WITH  OR  IMMEDIATELY  FOLLOWING  A  MEAL 09/27/21   Iran Ouch, MD  nystatin (MYCOSTATIN) powder Apply 1 Bottle topically 4 (four) times daily as needed (irritaton). Reported on 08/18/2015    [provider]  nystatin cream (MYCOSTATIN) Apply topically 2 (two) times daily. 03/19/20   [provider]  oxyCODONE-acetaminophen (PERCOCET/ROXICET) 5-325 MG tablet Take by mouth every 4 (four) hours as needed for severe pain.    [provider]  permethrin (ELIMITE) 5 % cream Apply to your body from chin to ankles once at bedtime. Wash off in the morning. 04/18/22   Immordino, Jeannett Senior, FNP  predniSONE (DELTASONE) 10 MG tablet Start with 3 pills tomorrow. Taper over the next 6 days.  3,3,2,2,1,1. 07/19/21   Vernie Murders, MD  sertraline (ZOLOFT) 100 MG tablet Take 100 mg by mouth daily. 03/04/20   [provider]  spironolactone (ALDACTONE) 25 MG tablet Take 1/2 (one-half) tablet by mouth once daily 09/27/20   Iran Ouch, MD  terbinafine (LAMISIL) 250 MG tablet Take 250 mg by mouth daily. 04/13/21   [provider]   Physical Exam: Vitals:   10/02/22 1746 10/02/22 2145 10/02/22 2200 10/02/22 2230  BP: (!) 134/97 (!) 136/99 (!) 132/95 (!) 138/104  Pulse: (!) 101 87 88 86  Resp: 18 (!) 22 19 (!) 29  Temp: 97.6 F (36.4 C)     TempSrc: Oral     SpO2: 94% 97% 95% 98%  Weight:      Height:       Constitutional: appears age appropriate.  Mildly  anxious. Eyes: PERRL, lids and conjunctivae normal ENMT: Mucous membranes are moist. Posterior pharynx clear of any exudate or lesions. Age-appropriate dentition. Hearing appropriate Neck: normal, supple, no masses, no thyromegaly Respiratory: clear to auscultation bilaterally, no wheezing, no crackles. Normal respiratory effort. No accessory muscle use.  Cardiovascular: Regular rate and rhythm, no murmurs / rubs / gallops. No extremity edema. 2+ pedal pulses. No carotid bruits.  Abdomen: Morbidly obese abdomen, no tenderness, no masses palpated, no hepatosplenomegaly. Bowel sounds positive.  Musculoskeletal: no clubbing / cyanosis. No joint deformity upper and lower extremities. Good ROM, no contractures, no atrophy.  Normal muscle tone.  Skin: no rashes, lesions, ulcers. No induration Neurologic: Sensation intact. Strength 5/5 in all 4.  Psychiatric: Normal judgment and insight. Alert and oriented x 3.  Anxious mood.   EKG: independently reviewed, showing sinus tachycardia with rate of 105, QTc 446  Chest x-ray on Admission: I personally reviewed and I agree with radiologist reading as below.  DG Chest 2 View  Result Date: 10/02/2022 CLINICAL DATA:  cp EXAM: CHEST - 2 VIEW COMPARISON:  Chest x-ray 11/07/2020 FINDINGS: Left chest wall single lead cardiac defibrillator. The heart and mediastinal contours are unchanged. No focal consolidation. No pulmonary edema. No pleural effusion. No pneumothorax. No acute osseous abnormality. IMPRESSION: No active cardiopulmonary disease. Electronically Signed   By: Tish Frederickson M.D.   On: 10/02/2022 19:52    Labs on Admission: I have personally reviewed following labs  CBC: Recent Labs  Lab 10/02/22 1748  WBC 5.6  HGB 14.9  HCT 45.8  MCV 93.7  PLT 139*   Basic Metabolic Panel: Recent Labs  Lab 10/02/22 1748  NA 142  K 3.6  CL 105  CO2 28  GLUCOSE 110*  BUN 11  CREATININE 0.99  CALCIUM 9.6   GFR: Estimated Creatinine Clearance: 78.9  mL/min (by C-G formula based on SCr of 0.99 mg/dL).  Urine analysis:    Component Value Date/Time   COLORURINE YELLOW (A) 03/01/2015 1212   APPEARANCEUR CLEAR (A) 03/01/2015 1212   APPEARANCEUR Clear 06/01/2013 1003   LABSPEC 1.014 03/01/2015 1212   LABSPEC 1.010 06/01/2013 1003   PHURINE 6.0 03/01/2015 1212   GLUCOSEU NEGATIVE 03/01/2015 1212   GLUCOSEU Negative 06/01/2013 1003   HGBUR 1+ (A) 03/01/2015 1212   BILIRUBINUR Negative 03/22/2021 1620   BILIRUBINUR Negative 06/01/2013 1003   KETONESUR NEGATIVE 03/01/2015 1212   PROTEINUR Negative 03/22/2021 1620   PROTEINUR NEGATIVE 03/01/2015 1212   UROBILINOGEN 0.2 03/22/2021 1620   NITRITE Negative 03/22/2021 1620   NITRITE NEGATIVE 03/01/2015 1212   LEUKOCYTESUR Negative 03/22/2021 1620   LEUKOCYTESUR Negative 06/01/2013 1003   CRITICAL CARE Performed by: Dr. Sedalia Muta  Total critical care time: 32 minutes  Critical care time was exclusive of separately billable procedures and treating other patients.  Critical care was necessary to treat or prevent imminent or life-threatening deterioration.  Critical care was time spent personally by me on the following activities: development of treatment plan with patient and/or surrogate as well as nursing, discussions with consultants, evaluation of patient's response to treatment, examination of patient, obtaining history from patient or surrogate, ordering and performing treatments and interventions, ordering and review of laboratory studies, ordering and review of radiographic studies, pulse oximetry and re-evaluation of patient's condition.  This document was prepared using Dragon Voice Recognition software and may include unintentional dictation errors.  Dr. Sedalia Muta [facility]  If 7PM-7AM, please contact overnight-coverage provider If 7AM-7PM, please contact day attending provider [email or web address]  10/02/2022, 10:59 PM

## 2022-10-03 ENCOUNTER — Inpatient Hospital Stay: Payer: Medicare HMO

## 2022-10-03 ENCOUNTER — Inpatient Hospital Stay
Admit: 2022-10-03 | Discharge: 2022-10-03 | Disposition: A | Discharge status: Home / Self Care | Payer: Medicare HMO | Attending: Internal Medicine | Admitting: Internal Medicine

## 2022-10-03 ENCOUNTER — Encounter
Admission: EM | Disposition: A | Discharge status: Home / Self Care | Payer: Self-pay | Source: Home / Self Care | Attending: Internal Medicine

## 2022-10-03 ENCOUNTER — Encounter: Payer: Self-pay | Admitting: Internal Medicine

## 2022-10-03 DIAGNOSIS — I2699 Other pulmonary embolism without acute cor pulmonale: Secondary | ICD-10-CM | POA: Insufficient documentation

## 2022-10-03 DIAGNOSIS — G4733 Obstructive sleep apnea (adult) (pediatric): Secondary | ICD-10-CM | POA: Diagnosis not present

## 2022-10-03 DIAGNOSIS — I2609 Other pulmonary embolism with acute cor pulmonale: Secondary | ICD-10-CM | POA: Diagnosis not present

## 2022-10-03 DIAGNOSIS — I5042 Chronic combined systolic (congestive) and diastolic (congestive) heart failure: Secondary | ICD-10-CM

## 2022-10-03 DIAGNOSIS — I200 Unstable angina: Secondary | ICD-10-CM | POA: Diagnosis not present

## 2022-10-03 DIAGNOSIS — I420 Dilated cardiomyopathy: Secondary | ICD-10-CM | POA: Diagnosis not present

## 2022-10-03 DIAGNOSIS — R7989 Other specified abnormal findings of blood chemistry: Secondary | ICD-10-CM | POA: Insufficient documentation

## 2022-10-03 DIAGNOSIS — G894 Chronic pain syndrome: Secondary | ICD-10-CM | POA: Diagnosis not present

## 2022-10-03 DIAGNOSIS — I82812 Embolism and thrombosis of superficial veins of left lower extremities: Secondary | ICD-10-CM | POA: Diagnosis not present

## 2022-10-03 DIAGNOSIS — I5023 Acute on chronic systolic (congestive) heart failure: Secondary | ICD-10-CM | POA: Diagnosis not present

## 2022-10-03 DIAGNOSIS — R600 Localized edema: Secondary | ICD-10-CM | POA: Diagnosis not present

## 2022-10-03 DIAGNOSIS — R079 Chest pain, unspecified: Secondary | ICD-10-CM

## 2022-10-03 HISTORY — PX: PULMONARY THROMBECTOMY: CATH118295

## 2022-10-03 LAB — ECHOCARDIOGRAM COMPLETE
AR max vel: 3.28 cm2
AV Area VTI: 2.82 cm2
AV Area mean vel: 2.81 cm2
AV Mean grad: 1 mmHg
AV Peak grad: 2.5 mmHg
Ao pk vel: 0.8 m/s
Area-P 1/2: 6.12 cm2
Calc EF: 35 %
Height: 67 in
MV VTI: 3.11 cm2
S' Lateral: 3.9 cm
Single Plane A2C EF: 27.5 %
Single Plane A4C EF: 39.3 %
Weight: 4032 [oz_av]

## 2022-10-03 LAB — CBC
HCT: 42.7 % (ref 36.0–46.0)
Hemoglobin: 14.2 g/dL (ref 12.0–15.0)
MCH: 30.9 pg (ref 26.0–34.0)
MCHC: 33.3 g/dL (ref 30.0–36.0)
MCV: 92.8 fL (ref 80.0–100.0)
Platelets: 127 10*3/uL — ABNORMAL LOW (ref 150–400)
RBC: 4.6 MIL/uL (ref 3.87–5.11)
RDW: 13.9 % (ref 11.5–15.5)
WBC: 5.9 10*3/uL (ref 4.0–10.5)
nRBC: 0 % (ref 0.0–0.2)

## 2022-10-03 LAB — BASIC METABOLIC PANEL
Anion gap: 7 (ref 5–15)
BUN: 13 mg/dL (ref 6–20)
CO2: 25 mmol/L (ref 22–32)
Calcium: 8.7 mg/dL — ABNORMAL LOW (ref 8.9–10.3)
Chloride: 107 mmol/L (ref 98–111)
Creatinine, Ser: 0.75 mg/dL (ref 0.44–1.00)
GFR, Estimated: >60 mL/min (ref >60)
Glucose, Bld: 106 mg/dL — ABNORMAL HIGH (ref 70–99)
Potassium: 3.4 mmol/L — ABNORMAL LOW (ref 3.5–5.1)
Sodium: 139 mmol/L (ref 135–145)

## 2022-10-03 LAB — PROTIME-INR
INR: 1.1 (ref 0.8–1.2)
Prothrombin Time: 14.9 s (ref 11.4–15.2)

## 2022-10-03 LAB — D-DIMER, QUANTITATIVE: D-Dimer, Quant: 16.05 ug{FEU}/mL — ABNORMAL HIGH (ref 0.00–0.50)

## 2022-10-03 LAB — APTT
aPTT: 150 s — ABNORMAL HIGH (ref 24–36)
aPTT: 151 s — ABNORMAL HIGH (ref 24–36)

## 2022-10-03 LAB — HEPARIN LEVEL (UNFRACTIONATED)
Heparin Unfractionated: 0.52 [IU]/mL (ref 0.30–0.70)
Heparin Unfractionated: 0.66 [IU]/mL (ref 0.30–0.70)
Heparin Unfractionated: 0.71 [IU]/mL — ABNORMAL HIGH (ref 0.30–0.70)

## 2022-10-03 SURGERY — PULMONARY THROMBECTOMY
Anesthesia: Moderate Sedation | Laterality: Bilateral

## 2022-10-03 MED ORDER — ALTEPLASE 1 MG/ML SYRINGE FOR VASCULAR PROCEDURE
INTRAMUSCULAR | Status: DC | PRN
  Administered 2022-10-03 (×2): 4 mg via INTRA_ARTERIAL

## 2022-10-03 MED ORDER — POTASSIUM CHLORIDE CRYS ER 20 MEQ PO TBCR
40.0000 meq | EXTENDED_RELEASE_TABLET | Freq: Once | ORAL | Status: AC
  Administered 2022-10-03: 40 meq via ORAL
  Filled 2022-10-03: qty 2

## 2022-10-03 MED ORDER — METHYLPREDNISOLONE SODIUM SUCC 125 MG IJ SOLR: 125.0000 mg | Freq: Once | INTRAMUSCULAR | Status: DC | PRN

## 2022-10-03 MED ORDER — SODIUM CHLORIDE 0.9 % IV SOLN: INTRAVENOUS | Status: DC

## 2022-10-03 MED ORDER — REGADENOSON 0.4 MG/5ML IV SOLN
0.4000 mg | Freq: Once | INTRAVENOUS | Status: AC
  Administered 2022-10-03: 0.4 mg via INTRAVENOUS
  Filled 2022-10-03: qty 5

## 2022-10-03 MED ORDER — ALTEPLASE 2 MG IJ SOLR
INTRAMUSCULAR | Status: AC
  Filled 2022-10-03: qty 8

## 2022-10-03 MED ORDER — FAMOTIDINE 20 MG PO TABS: 40.0000 mg | ORAL_TABLET | Freq: Once | ORAL | Status: DC | PRN

## 2022-10-03 MED ORDER — IOHEXOL 350 MG/ML SOLN
75.0000 mL | Freq: Once | INTRAVENOUS | Status: AC | PRN
  Administered 2022-10-03: 75 mL via INTRAVENOUS

## 2022-10-03 MED ORDER — PERFLUTREN LIPID MICROSPHERE
1.0000 mL | INTRAVENOUS | Status: AC | PRN
  Administered 2022-10-03: 6 mL via INTRAVENOUS

## 2022-10-03 MED ORDER — ONDANSETRON HCL 4 MG/2ML IJ SOLN: 4.0000 mg | Freq: Four times a day (QID) | INTRAMUSCULAR | Status: DC | PRN

## 2022-10-03 MED ORDER — TECHNETIUM TC 99M TETROFOSMIN IV KIT
30.0000 | PACK | Freq: Once | INTRAVENOUS | Status: AC | PRN
  Administered 2022-10-03: 31.64 via INTRAVENOUS

## 2022-10-03 MED ORDER — MIDAZOLAM HCL 2 MG/2ML IJ SOLN
INTRAMUSCULAR | Status: DC | PRN
  Administered 2022-10-03: 2 mg via INTRAVENOUS
  Administered 2022-10-03: 1 mg via INTRAVENOUS

## 2022-10-03 MED ORDER — IODIXANOL 320 MG/ML IV SOLN
INTRAVENOUS | Status: DC | PRN
  Administered 2022-10-03: 50 mL via INTRAVENOUS

## 2022-10-03 MED ORDER — TECHNETIUM TC 99M TETROFOSMIN IV KIT
10.0000 | PACK | Freq: Once | INTRAVENOUS | Status: AC | PRN
  Administered 2022-10-03: 10.34 via INTRAVENOUS

## 2022-10-03 MED ORDER — FENTANYL CITRATE (PF) 100 MCG/2ML IJ SOLN
INTRAMUSCULAR | Status: DC | PRN
  Administered 2022-10-03: 12.5 ug via INTRAVENOUS
  Administered 2022-10-03: 25 ug via INTRAVENOUS
  Administered 2022-10-03: 50 ug via INTRAVENOUS

## 2022-10-03 MED ORDER — LIDOCAINE-EPINEPHRINE (PF) 1 %-1:200000 IJ SOLN
INTRAMUSCULAR | Status: DC | PRN
  Administered 2022-10-03: 10 mL via INTRADERMAL

## 2022-10-03 MED ORDER — HYDROMORPHONE HCL 1 MG/ML IJ SOLN: 1.0000 mg | Freq: Once | INTRAMUSCULAR | Status: DC | PRN

## 2022-10-03 MED ORDER — FENTANYL CITRATE PF 50 MCG/ML IJ SOSY: 12.5000 ug | PREFILLED_SYRINGE | Freq: Once | INTRAMUSCULAR | Status: DC | PRN

## 2022-10-03 MED ORDER — EMPAGLIFLOZIN 10 MG PO TABS
10.0000 mg | ORAL_TABLET | Freq: Every day | ORAL | Status: DC
  Filled 2022-10-03 (×2): qty 1

## 2022-10-03 MED ORDER — FENTANYL CITRATE (PF) 100 MCG/2ML IJ SOLN
INTRAMUSCULAR | Status: AC
  Filled 2022-10-03: qty 2

## 2022-10-03 MED ORDER — HEPARIN (PORCINE) IN NACL 1000-0.9 UT/500ML-% IV SOLN
INTRAVENOUS | Status: DC | PRN
  Administered 2022-10-03: 1000 mL

## 2022-10-03 MED ORDER — MIDAZOLAM HCL 2 MG/ML PO SYRP
8.0000 mg | ORAL_SOLUTION | Freq: Once | ORAL | Status: DC | PRN
  Filled 2022-10-03: qty 5

## 2022-10-03 MED ORDER — HEPARIN SODIUM (PORCINE) 1000 UNIT/ML IJ SOLN
INTRAMUSCULAR | Status: AC
  Filled 2022-10-03: qty 10

## 2022-10-03 MED ORDER — CEFAZOLIN SODIUM-DEXTROSE 2-4 GM/100ML-% IV SOLN
INTRAVENOUS | Status: AC
  Filled 2022-10-03: qty 100

## 2022-10-03 MED ORDER — DIPHENHYDRAMINE HCL 50 MG/ML IJ SOLN: 50.0000 mg | Freq: Once | INTRAMUSCULAR | Status: DC | PRN

## 2022-10-03 MED ORDER — CEFAZOLIN SODIUM-DEXTROSE 2-4 GM/100ML-% IV SOLN
2.0000 g | INTRAVENOUS | Status: AC
  Administered 2022-10-03: 2 g via INTRAVENOUS

## 2022-10-03 MED ORDER — HYDROCORTISONE 1 % EX CREA
1.0000 | TOPICAL_CREAM | Freq: Three times a day (TID) | CUTANEOUS | Status: DC | PRN
  Filled 2022-10-03: qty 28

## 2022-10-03 MED ORDER — MIDAZOLAM HCL 2 MG/2ML IJ SOLN
INTRAMUSCULAR | Status: AC
  Filled 2022-10-03: qty 4

## 2022-10-03 SURGICAL SUPPLY — 19 items
CANISTER PENUMBRA ENGINE (MISCELLANEOUS) IMPLANT
CATH ACCU-VU SIZ PIG 5F 100CM (CATHETERS) IMPLANT
CATH BEACON 5 .038 100 VERT TP (CATHETERS) IMPLANT
CATH INDIGO 12XTORQ 100 (CATHETERS) IMPLANT
CATH INDIGO SEP 12 (CATHETERS) IMPLANT
CATH SELECT BERN TIP 5F 130 (CATHETERS) IMPLANT
CLOSURE PERCLOSE PROSTYLE (VASCULAR PRODUCTS) IMPLANT
COVER PROBE ULTRASOUND 5X96 (MISCELLANEOUS) IMPLANT
GLIDEWIRE ADV .035X180CM (WIRE) IMPLANT
PACK ANGIOGRAPHY (CUSTOM PROCEDURE TRAY) ×1 IMPLANT
SHEATH BRITE TIP 6FRX11 (SHEATH) IMPLANT
SHEATH BRITE TIP 8FRX11 (SHEATH) IMPLANT
SHEATH CHCK-FLO 14FR 13 (SHEATH) IMPLANT
SHEATH FAST CATH 12F 12CM (SHEATH) IMPLANT
SUT MNCRL AB 4-0 PS2 18 (SUTURE) IMPLANT
SYR MEDRAD MARK 7 150ML (SYRINGE) IMPLANT
TUBING CONTRAST HIGH PRESS 72 (TUBING) IMPLANT
WIRE GUIDERIGHT .035X150 (WIRE) IMPLANT
WIRE SUPRACORE 300CM (WIRE) IMPLANT

## 2022-10-03 NOTE — Progress Notes (Signed)
*  PRELIMINARY RESULTS* Echocardiogram 2D Echocardiogram has been performed.  Lisa Fuentes 10/03/2022, 8:44 AM

## 2022-10-03 NOTE — Interval H&P Note (Signed)
History and Physical Interval Note:  10/03/2022 3:38 PM  Lisa Fuentes  has presented today for surgery, with the diagnosis of Pulmonary Embolism.  The various methods of treatment have been discussed with the patient and family. After consideration of risks, benefits and other options for treatment, the patient has consented to  Procedure(s): PULMONARY THROMBECTOMY (Bilateral) as a surgical intervention.  The patient's history has been reviewed, patient examined, no change in status, stable for surgery.  I have reviewed the patient's chart and labs.  Questions were answered to the patient's satisfaction.     Festus Barren

## 2022-10-03 NOTE — Consult Note (Signed)
[facility] CLINIC CARDIOLOGY CONSULT NOTE       Patient ID: Lisa Fuentes MRN: [id number] DOB/AGE: December 09, 1960 62 y.o.  Admit date: 10/02/2022 Referring Physician Dr. Londell Moh Primary Physician Dr. Jerl Mina Primary Cardiologist Dr. Darrold Junker Reason for Consultation chest pain  HPI: Lisa Fuentes is a 62 y.o. female  with a past medical history of nonischemic dilated cardiomyopathy, HFrEF with LVEF 40 to 45%, history of ventricular tachycardia, status post ICD, hypertension, hyperlipidemia, obesity, OSA who presented to the ED on 10/02/2022 for chest pain. Cardiology was consulted for further evaluation.   Patient reports she was in her usual state of health until yesterday when she was sitting at home and had onset of chest pain rated 8/10, described as pressure with radiation to her R neck. States that the pressure would get worse with exertion and improve with rest. Also reports onset of SOB about 30 minutes after the chest pain began. The pain was persistent throughout the day and she decided to come to the ED for evaluation. Workup in the ED notable for creatinine 0.99, potassium 3.6, hemoglobin 14.9.  Troponin trended 52 > 126.  EKG nonacute. Patient given IV morphine for pain.  At the time of my evaluation this morning patient is resting comfortably in ED stretcher.  States that her pain is much improved at this time after receiving morphine.  She denies any similar episodes recently, states that overall she had been doing well prior to her presentation yesterday.  Has been tolerating GDMT for her nonischemic cardiomyopathy and heart failure without any issues.  Denies any worsening shortness of breath or leg swelling over the last few weeks.  Review of systems complete and found to be negative unless listed above    Past Medical History:  Diagnosis Date   AICD (automatic cardioverter/defibrillator) present 2008   a.) Medtronic device placed 2008. b.) replaced 06/21/2013 (Medtronic  Evera XT single-chamber ICD; model number [id number]).   Anemia    Anxiety    Aortic dilatation (HCC) 03/24/2019   a.) TTE 03/24/2019: Ao root measured 37 mm. b.) TTE 03/21/2020: Ao root measured 37 mm.   Arthritis    Depression    Difficult intubation    DOE (dyspnea on exertion)    Endometriosis of vagina 09/2013   Intra-operative findings of endometriosis implants on cervical stump   Essential hypertension    GERD (gastroesophageal reflux disease)    Headache    HFrEF (heart failure with reduced ejection fraction) (HCC)    a.) TTE 02/18/2014: EF 40-45%; tiv AR, mild MR/TR/PR; G1DD. b.) TTE 02/22/2017: EF 45-50%; mild LVH; triv PR; G1DD. c.) TTE 03/24/2019: EF 45-50%; glob HK, mild LVH; triv MR/TR/PR. d.) TTE 03/21/2020: EF 40-45%; glob HK.   History of 2019 novel coronavirus disease (COVID-19) 02/02/2020   History of 2019 novel coronavirus disease (COVID-19) 02/02/2020   Hypokalemia    Hypothyroidism    a.) s/p radioactive iodine Tx; on levothyroxine   Mixed hyperlipidemia    Nonischemic cardiomyopathy (HCC)    a.) TTE 02/18/2014: EF 40-45%. b.) TTE 02/22/2017: EF 45-50%. c.) TTE 03/24/2019: EF 45-50%. d.) TTE 03/21/2020: EF 40-45%   OSA on CPAP    Panic attacks    PSVT (paroxysmal supraventricular tachycardia) 03/30/2020   a.) Holter 03/30/2020 --> 2 runs; fastest lasting 4 beats (184 bpm); longest lasting 4 beats (113 bpm)   Tachycardia     Past Surgical History:  Procedure Laterality Date   ANTERIOR AND POSTERIOR REPAIR N/A 11/25/2017  Procedure: ANTERIOR (CYSTOCELE) AND POSTERIOR REPAIR (RECTOCELE);  Surgeon: Hildred Laser, MD;  Location: [facility] ORS;  Service: Gynecology;  Laterality: N/A;   BREAST CYST ASPIRATION Left 03/22/2014   neg/ done by Dr Evette Cristal   CARDIAC CATHETERIZATION  2003   [facility]: No significant coronary artery disease with reduced ejection fraction.   CARDIAC DEFIBRILLATOR PLACEMENT  04/2006   replaced 05/2013   CARDIAC DEFIBRILLATOR PLACEMENT  06/21/2013    Procedure:  CARDIAC DEFIBRILLATOR PLACEMENT (Medtronic single-chamber ICD, model number Lianne Moris, model number [id number]): Location: [facility]; Surgeon: Gerre Pebbles, MD   CARPAL TUNNEL RELEASE Right    COLONOSCOPY  2015   COLONOSCOPY N/A 10/13/2020   Procedure: COLONOSCOPY;  Surgeon: Jaynie Collins, DO;  Location: [facility] ENDOSCOPY;  Service: Gastroenterology;  Laterality: N/A;   COLONOSCOPY WITH PROPOFOL N/A 10/04/2015   Procedure: COLONOSCOPY WITH PROPOFOL;  Surgeon: Midge Minium, MD;  Location: [facility] ENDOSCOPY;  Service: Endoscopy;  Laterality: N/A;   CORONARY ANGIOPLASTY     CYSTOSCOPY  11/15/2014   Procedure: CYSTOSCOPY;  Surgeon: Hildred Laser, MD;  Location: [facility] ORS;  Service: Gynecology;;   DIAGNOSTIC LAPAROSCOPY     ETHMOIDECTOMY Right 07/19/2021   Procedure: TOTAL ETHMOIDECTOMY WITH FRONTAL SINUS EXPLORATION;  Surgeon: Vernie Murders, MD;  Location: [facility] ORS;  Service: ENT;  Laterality: Right;   FOOT SURGERY Bilateral    heer spur and bunion   IMAGE GUIDED SINUS SURGERY N/A 07/19/2021   Procedure: IMAGE GUIDED SINUS SURGERY;  Surgeon: Vernie Murders, MD;  Location: [facility] ORS;  Service: ENT;  Laterality: N/A;   KNEE ARTHROSCOPY Left 03/02/2015   Procedure: ARTHROSCOPY  LEFT KNEE, PARTIAL LATERAL  MENISECTOMY, SYNOVECTOMY, MEDIAL & LATERAL CHONDROPLASTY;  Surgeon: Juanell Fairly, MD;  Location: [facility] ORS;  Service: Orthopedics;  Laterality: Left;   LAPAROSCOPIC SALPINGO OOPHERECTOMY Left 11/15/2014   Procedure: LAPAROSCOPIC OOPHORECTOMY;  Surgeon: Hildred Laser, MD;  Location: [facility] ORS;  Service: Gynecology;  Laterality: Left;   LAPAROSCOPIC SALPINGOOPHERECTOMY Right 09/2013   also with left salpingectomy   LEFT HEART CATH AND CORONARY ANGIOGRAPHY N/A 03/28/2020   Procedure: LEFT HEART CATH AND CORONARY ANGIOGRAPHY;  Surgeon: Iran Ouch, MD;  Location: [facility] INVASIVE CV LAB;  Service: Cardiovascular;  Laterality: N/A;   MAXILLARY ANTROSTOMY Right 07/19/2021   Procedure: MAXILLARY  ANTROSTOMY WITH REMOVAL TISSUE;  Surgeon: Vernie Murders, MD;  Location: [facility] ORS;  Service: ENT;  Laterality: Right;   MINOR HEMORRHOIDECTOMY     NASAL TURBINATE REDUCTION Bilateral 07/19/2021   Procedure: INFERIOR TURBINATE REDUCTION;  Surgeon: Vernie Murders, MD;  Location: [facility] ORS;  Service: ENT;  Laterality: Bilateral;   SEPTOPLASTY N/A 07/19/2021   Procedure: SEPTOPLASTY;  Surgeon: Vernie Murders, MD;  Location: [facility] ORS;  Service: ENT;  Laterality: N/A;   TONSILLECTOMY     TOTAL ABDOMINAL HYSTERECTOMY     TRACHELECTOMY N/A 11/15/2014   Procedure: TRACHELECTOMY;  Surgeon: Hildred Laser, MD;  Location: [facility] ORS;  Service: Gynecology;  Laterality: N/A;   TRIGGER FINGER RELEASE Right    TUBAL LIGATION Bilateral     (Not in a hospital admission)  Social History   Socioeconomic History   Marital status: Married    Spouse name: Jimmy   Number of children: 2   Years of education: Not on file   Highest education level: Not on file  Occupational History   Not on file  Tobacco Use   Smoking status: Never   Smokeless tobacco: Never  Vaping Use   Vaping status: Never Used  Substance and Sexual Activity  Alcohol use: Yes    Alcohol/week: 0.0 standard drinks of alcohol    Comment: occassionally   Drug use: No   Sexual activity: Not Currently    Birth control/protection: Surgical  Other Topics Concern   Not on file  Social History Narrative   Not on file   Social Determinants of Health   Financial Resource Strain: Low Risk  (01/08/2022)   Received from [facility], [facility]   Overall Financial Resource Strain (CARDIA)    Difficulty of Paying Living Expenses: Not hard at all  Food Insecurity: No Food Insecurity (01/08/2022)   Received from [facility], [facility]   Hunger Vital Sign    Worried About Running Out of Food in the Last Year: Never true    Ran Out of Food in the Last Year: Never true   Transportation Needs: No Transportation Needs (01/08/2022)   Received from [facility], [facility]   [facility] - Transportation    In the past 12 months, has lack of transportation kept you from medical appointments or from getting medications?: No    Lack of Transportation (Non-Medical): No  Physical Activity: Not on file  Stress: Not on file  Social Connections: Not on file  Intimate Partner Violence: Not on file    Family History  Problem Relation Age of Onset   Hypertension Mother    Hyperlipidemia Mother    Stroke Mother    Colon cancer Mother    Breast cancer Sister 22   Ovarian cancer Sister    Throat cancer Brother      Vitals:   10/03/22 0500 10/03/22 0530 10/03/22 0548 10/03/22 0600  BP: 107/84 113/86  113/80  Pulse: 89 93  (!) 113  Resp: (!) 27 (!) 30 20 (!) 24  Temp:      TempSrc:      SpO2: 91% (!) 89% 93% 96%  Weight:      Height:        PHYSICAL EXAM General: Well appearing female, well nourished, in no acute distress laying flat in ED stretcher. HEENT: Normocephalic and atraumatic. Neck: No JVD.  Lungs: Normal respiratory effort on 2L. Clear bilaterally to auscultation. No wheezes, crackles, rhonchi.  Heart: HRRR. Normal S1 and S2 without gallops or murmurs.  Abdomen: Non-distended appearing.  Msk: Normal strength and tone for age. Extremities: Warm and well perfused. No clubbing, cyanosis. Trace edema.  Neuro: Alert and oriented X 3. Psych: Answers questions appropriately.   Labs: Basic Metabolic Panel: Recent Labs    10/02/22 1748 10/03/22 0629  NA 142 139  K 3.6 3.4*  CL 105 107  CO2 28 25  GLUCOSE 110* 106*  BUN 11 13  CREATININE 0.99 0.75  CALCIUM 9.6 8.7*   Liver Function Tests: No results for input(s): "AST", "ALT", "ALKPHOS", "BILITOT", "PROT", "ALBUMIN" in the last 72 hours. No results for input(s): "LIPASE", "AMYLASE" in the last 72 hours. CBC: Recent Labs    10/02/22 1748 10/03/22 0629   WBC 5.6 5.9  HGB 14.9 14.2  HCT 45.8 42.7  MCV 93.7 92.8  PLT 139* 127*   Cardiac Enzymes: Recent Labs    10/02/22 1748 10/02/22 2146  TROPONINIHS 52* 126*   BNP: No results for input(s): "BNP" in the last 72 hours. D-Dimer: No results for input(s): "DDIMER" in the last 72 hours. Hemoglobin A1C: No results for input(s): "HGBA1C" in the last 72 hours. Fasting Lipid Panel: No results  for input(s): "CHOL", "HDL", "LDLCALC", "TRIG", "CHOLHDL", "LDLDIRECT" in the last 72 hours. Thyroid Function Tests: No results for input(s): "TSH", "T4TOTAL", "T3FREE", "THYROIDAB" in the last 72 hours.  Invalid input(s): "FREET3" Anemia Panel: No results for input(s): "VITAMINB12", "FOLATE", "FERRITIN", "TIBC", "IRON", "RETICCTPCT" in the last 72 hours.   Radiology: DG Chest 2 View  Result Date: 10/02/2022 CLINICAL DATA:  cp EXAM: CHEST - 2 VIEW COMPARISON:  Chest x-ray 11/07/2020 FINDINGS: Left chest wall single lead cardiac defibrillator. The heart and mediastinal contours are unchanged. No focal consolidation. No pulmonary edema. No pleural effusion. No pneumothorax. No acute osseous abnormality. IMPRESSION: No active cardiopulmonary disease. Electronically Signed   By: Tish Frederickson M.D.   On: 10/02/2022 19:52    ECHO pending  TELEMETRY reviewed by me [facility]) 10/03/2022: NSR rate 80-90s, PVCs  EKG reviewed by me: Sinus tachycardia rate 105 bpm, nonspecific ST T wave changes, nonacute  Data reviewed by me [facility]) 10/03/2022: last 24h vitals tele labs imaging I/O ED provider note, admission H&P  Principal Problem:   Chest pain Active Problems:   Essential hypertension   Hypercholesteremia   ICD (implantable cardioverter-defibrillator) in place   Sleep apnea   Dilated cardiomyopathy (HCC)   Mixed hyperlipidemia   Anxiety   Chronic pain syndrome   Depression   HFrEF (heart failure with reduced ejection fraction) (HCC)   Morbid obesity (HCC)   Combined congestive systolic and diastolic  heart failure (HCC)   NSTEMI (non-ST elevated myocardial infarction) (HCC)    ASSESSMENT AND PLAN:  Lisa Fuentes is a 62 y.o. female  with a past medical history of nonischemic dilated cardiomyopathy, HFrEF with LVEF 40 to 45%, history of ventricular tachycardia, status post ICD, hypertension, hyperlipidemia, obesity, OSA who presented to the ED on 10/02/2022 for chest pain. Cardiology was consulted for further evaluation.   # Chest pain Patient presenting with chest pain onset yesterday which was persistent throughout the day.  Had associated shortness of breath, symptoms were worse with exertion.  Troponins trended 52 > 26, EKG nonacute.  Pain improved with morphine, patient not given nitroglycerin. -Nuclear stress test ordered  # Non-ischemic dilated cardiomyopathy s/p Medtronic ICD 2008 # Chronic HFrEF Patient with chronically reduced heart function and nonischemic dilated cardiomyopathy status post ICD placement in 2008.  Endorses strict compliance with home GDMT regimen and no recent issues with worsening shortness of breath or lower extremity edema.  Has chronic, stable leg swelling for which she is attempting to establish with vein/vascular clinic. -Echo pending -Home metoprolol, spironolactone, jardiance reordered. Entresto currently held given borderline BP but will plan to restart as BP allows.  # Obstructive sleep apnea Patient with known sleep apnea, desaturations noted to the 80s overnight that she was placed on 2 L -Wean O2 as tolerated  This patient's plan of care was discussed and created with Dr. Juliann Pares and he is in agreement.  Signed: Gale Journey, PA-C  10/03/2022, 9:52 AM [facility] Cardiology

## 2022-10-03 NOTE — Progress Notes (Signed)
ANTICOAGULATION CONSULT NOTE  Pharmacy Consult for heparin infusion Indication: ACS/STEMI  Allergies  Allergen Reactions   Sulfa Antibiotics Hives   Sulfamethoxazole-Trimethoprim Hives    Patient Measurements: Height: 5\' 7"  (170.2 cm) Weight: 114.3 kg (252 lb) IBW/kg (Calculated) : 61.6 Heparin Dosing Weight: 88.2 kg  Vital Signs: Temp: 97.9 F (36.6 C) (09/04 0217) Temp Source: Oral (09/04 0217) BP: 113/80 (09/04 0600) Pulse Rate: 113 (09/04 0600)  Labs: Recent Labs    10/02/22 1748 10/02/22 2146 10/03/22 0019 10/03/22 0629 10/03/22 0630  HGB 14.9  --   --  14.2  --   HCT 45.8  --   --  42.7  --   PLT 139*  --   --  127*  --   APTT  --   --  151*  --  150*  LABPROT  --   --  14.9  --   --   INR  --   --  1.1  --   --   HEPARINUNFRC  --   --  0.71*  --  0.66  CREATININE 0.99  --   --  0.75  --   TROPONINIHS 52* 126*  --   --   --     Estimated Creatinine Clearance: 97.6 mL/min (by C-G formula based on SCr of 0.75 mg/dL).   Medical History: Past Medical History:  Diagnosis Date   AICD (automatic cardioverter/defibrillator) present 2008   a.) Medtronic device placed 2008. b.) replaced 06/21/2013 (Medtronic Evera XT single-chamber ICD; model number [id number]).   Anemia    Anxiety    Aortic dilatation (HCC) 03/24/2019   a.) TTE 03/24/2019: Ao root measured 37 mm. b.) TTE 03/21/2020: Ao root measured 37 mm.   Arthritis    Depression    Difficult intubation    DOE (dyspnea on exertion)    Endometriosis of vagina 09/2013   Intra-operative findings of endometriosis implants on cervical stump   Essential hypertension    GERD (gastroesophageal reflux disease)    Headache    HFrEF (heart failure with reduced ejection fraction) (HCC)    a.) TTE 02/18/2014: EF 40-45%; tiv AR, mild MR/TR/PR; G1DD. b.) TTE 02/22/2017: EF 45-50%; mild LVH; triv PR; G1DD. c.) TTE 03/24/2019: EF 45-50%; glob HK, mild LVH; triv MR/TR/PR. d.) TTE 03/21/2020: EF 40-45%; glob HK.   History  of 2019 novel coronavirus disease (COVID-19) 02/02/2020   History of 2019 novel coronavirus disease (COVID-19) 02/02/2020   Hypokalemia    Hypothyroidism    a.) s/p radioactive iodine Tx; on levothyroxine   Mixed hyperlipidemia    Nonischemic cardiomyopathy (HCC)    a.) TTE 02/18/2014: EF 40-45%. b.) TTE 02/22/2017: EF 45-50%. c.) TTE 03/24/2019: EF 45-50%. d.) TTE 03/21/2020: EF 40-45%   OSA on CPAP    Panic attacks    PSVT (paroxysmal supraventricular tachycardia) 03/30/2020   a.) Holter 03/30/2020 --> 2 runs; fastest lasting 4 beats (184 bpm); longest lasting 4 beats (113 bpm)   Tachycardia     Assessment: Pt is a 62 yo female with hx of multiple DVTs of the right upper extremity. CTA chest showed left lower lobe segmental and subsegmental pulmonary emboli without evidence of right heart strain. The patient was started on Eliquis. She completed several months of treatment of Eliquis, and it was recently discontinued per hematology as reported on 09/18/22. Pt presenting to ED c/o CP and SOB, found with elevated Troponin I level, trending up.  Baseline labs: Hgb 14.9  Plt 139 INR 1.1  aPTT 151  HL 0.71   (drawn @ 0019- after heparin drip was already hung @ 2327)   0904 0630  HL 0.66  therapeutic x 1   Goal of Therapy:  Heparin level 0.3-0.7 units/ml Monitor platelets by anticoagulation protocol: Yes   Plan: 0904 0630 HL=0.66  therapeutic x 1 Will continue heparin infusion at 1200 units/hr Will check confirmatory HL in 6 hr  CBC daily while on heparin  Bari Mantis PharmD Clinical Pharmacist 10/03/2022'

## 2022-10-03 NOTE — H&P (View-Only) (Signed)
Hospital Consult    Reason for Consult:  Pulmonary Embolism Requesting Physician:  Dr Marrion Coy MD MRN #:  [id number]  History of Present Illness: This is a 62 y.o. female with history of hypertension, depression, GERD, hypothyroid, non-insulin-dependent diabetes mellitus, who presents emergency department for chief concerns of chest pain and shortness of breath. She reports some discomfort radiation to her neck and right shoulder. She reports that currently she is not having the same chest pressure she had with exertion. She reports it still feels tight there.   Upon workup patient had CTA of the chest this afternoon.  Impression was she has moderate volume acute pulmonary embolism with evidence of right heart strain.  No lung infarction.  Vascular surgery was consulted to evaluate for pulmonary thrombectomy.  Past Medical History:  Diagnosis Date   AICD (automatic cardioverter/defibrillator) present 2008   a.) Medtronic device placed 2008. b.) replaced 06/21/2013 (Medtronic Evera XT single-chamber ICD; model number [id number]).   Anemia    Anxiety    Aortic dilatation (HCC) 03/24/2019   a.) TTE 03/24/2019: Ao root measured 37 mm. b.) TTE 03/21/2020: Ao root measured 37 mm.   Arthritis    Depression    Difficult intubation    DOE (dyspnea on exertion)    Endometriosis of vagina 09/2013   Intra-operative findings of endometriosis implants on cervical stump   Essential hypertension    GERD (gastroesophageal reflux disease)    Headache    HFrEF (heart failure with reduced ejection fraction) (HCC)    a.) TTE 02/18/2014: EF 40-45%; tiv AR, mild MR/TR/PR; G1DD. b.) TTE 02/22/2017: EF 45-50%; mild LVH; triv PR; G1DD. c.) TTE 03/24/2019: EF 45-50%; glob HK, mild LVH; triv MR/TR/PR. d.) TTE 03/21/2020: EF 40-45%; glob HK.   History of 2019 novel coronavirus disease (COVID-19) 02/02/2020   History of 2019 novel coronavirus disease (COVID-19) 02/02/2020   Hypokalemia    Hypothyroidism     a.) s/p radioactive iodine Tx; on levothyroxine   Mixed hyperlipidemia    Nonischemic cardiomyopathy (HCC)    a.) TTE 02/18/2014: EF 40-45%. b.) TTE 02/22/2017: EF 45-50%. c.) TTE 03/24/2019: EF 45-50%. d.) TTE 03/21/2020: EF 40-45%   OSA on CPAP    Panic attacks    PSVT (paroxysmal supraventricular tachycardia) 03/30/2020   a.) Holter 03/30/2020 --> 2 runs; fastest lasting 4 beats (184 bpm); longest lasting 4 beats (113 bpm)   Tachycardia     Past Surgical History:  Procedure Laterality Date   ANTERIOR AND POSTERIOR REPAIR N/A 11/25/2017   Procedure: ANTERIOR (CYSTOCELE) AND POSTERIOR REPAIR (RECTOCELE);  Surgeon: Hildred Laser, MD;  Location: [facility] ORS;  Service: Gynecology;  Laterality: N/A;   BREAST CYST ASPIRATION Left 03/22/2014   neg/ done by Dr Evette Cristal   CARDIAC CATHETERIZATION  2003   [facility]: No significant coronary artery disease with reduced ejection fraction.   CARDIAC DEFIBRILLATOR PLACEMENT  04/2006   replaced 05/2013   CARDIAC DEFIBRILLATOR PLACEMENT  06/21/2013   Procedure:  CARDIAC DEFIBRILLATOR PLACEMENT (Medtronic single-chamber ICD, model number Lianne Moris, model number [id number]): Location: [facility]; Surgeon: Gerre Pebbles, MD   CARPAL TUNNEL RELEASE Right    COLONOSCOPY  2015   COLONOSCOPY N/A 10/13/2020   Procedure: COLONOSCOPY;  Surgeon: Jaynie Collins, DO;  Location: [facility] ENDOSCOPY;  Service: Gastroenterology;  Laterality: N/A;   COLONOSCOPY WITH PROPOFOL N/A 10/04/2015   Procedure: COLONOSCOPY WITH PROPOFOL;  Surgeon: Midge Minium, MD;  Location: [facility] ENDOSCOPY;  Service: Endoscopy;  Laterality: N/A;   CORONARY ANGIOPLASTY  CYSTOSCOPY  11/15/2014   Procedure: CYSTOSCOPY;  Surgeon: Hildred Laser, MD;  Location: [facility] ORS;  Service: Gynecology;;   DIAGNOSTIC LAPAROSCOPY     ETHMOIDECTOMY Right 07/19/2021   Procedure: TOTAL ETHMOIDECTOMY WITH FRONTAL SINUS EXPLORATION;  Surgeon: Vernie Murders, MD;  Location: [facility] OR[facility];  Service: ENT;  Laterality: Right;    FOOT SURGERY Bilateral    heer spur and bunion   IMAGE GUIDED SINUS SURGERY N/A 07/19/2021   Procedure: IMAGE GUIDED SINUS SURGERY;  Surgeon: Vernie Murders, MD;  Location: [facility];  Service: ENT;  Laterality: N/A;   KNEE ARTHROSCOPY Left 03/02/2015   Procedure: ARTHROSCOPY  LEFT KNEE, PARTIAL LATERAL  MENISECTOMY, SYNOVECTOMY, MEDIAL & LATERAL CHONDROPLASTY;  Surgeon: Juanell Fairly, MD;  Location: [facility];  Service: Orthopedics;  Laterality: Left;   LAPAROSCOPIC SALPINGO OOPHERECTOMY Left 11/15/2014   Procedure: LAPAROSCOPIC OOPHORECTOMY;  Surgeon: Hildred Laser, MD;  Location: [facility];  Service: Gynecology;  Laterality: Left;   LAPAROSCOPIC SALPINGOOPHERECTOMY Right 09/2013   also with left salpingectomy   LEFT HEART CATH AND CORONARY ANGIOGRAPHY N/A 03/28/2020   Procedure: LEFT HEART CATH AND CORONARY ANGIOGRAPHY;  Surgeon: Iran Ouch, MD;  Location: [facility] INVASIVE CV LAB;  Service: Cardiovascular;  Laterality: N/A;   MAXILLARY ANTROSTOMY Right 07/19/2021   Procedure: MAXILLARY ANTROSTOMY WITH REMOVAL TISSUE;  Surgeon: Vernie Murders, MD;  Location: [facility] OR[facility];  Service: ENT;  Laterality: Right;   MINOR HEMORRHOIDECTOMY     NASAL TURBINATE REDUCTION Bilateral 07/19/2021   Procedure: INFERIOR TURBINATE REDUCTION;  Surgeon: Vernie Murders, MD;  Location: [facility];  Service: ENT;  Laterality: Bilateral;   SEPTOPLASTY N/A 07/19/2021   Procedure: SEPTOPLASTY;  Surgeon: Vernie Murders, MD;  Location: [facility] OR[facility];  Service: ENT;  Laterality: N/A;   TONSILLECTOMY     TOTAL ABDOMINAL HYSTERECTOMY     TRACHELECTOMY N/A 11/15/2014   Procedure: TRACHELECTOMY;  Surgeon: Hildred Laser, MD;  Location: [facility] ORS;  Service: Gynecology;  Laterality: N/A;   TRIGGER FINGER RELEASE Right    TUBAL LIGATION Bilateral     Allergies  Allergen Reactions   Sulfa Antibiotics Hives   Sulfamethoxazole-Trimethoprim Hives    Prior to Admission medications   Medication Sig Start Date End Date Taking? Authorizing  Provider  ENTRESTO 24-26 MG Take 1 tablet by mouth 2 (two) times daily. 06/19/22  Yes [provider]  meloxicam (MOBIC) 15 MG tablet Take 15 mg by mouth daily with breakfast. 02/28/22  Yes [provider]  Acetaminophen (TYLENOL ARTHRITIS PAIN PO) Take by mouth.    [provider]  esomeprazole (NEXIUM) 40 MG capsule Take 40 mg by mouth every morning.     [provider]  fluocinonide-emollient (LIDEX-E) 0.05 % cream Apply 1 Application topically 2 (two) times daily. 04/18/22   Immordino, Jeannett Senior, FNP  gabapentin (NEURONTIN) 100 MG capsule Take 100 mg by mouth 3 (three) times daily. 02/16/21   [provider]  JARDIANCE 10 MG TABS tablet TAKE 1 TABLET BY MOUTH ONCE DAILY BEFORE BREAKFAST 05/08/21   Iran Ouch, MD  levothyroxine (SYNTHROID) 125 MCG tablet Take 125 mcg by mouth every morning. 02/27/21   [provider]  metoprolol succinate (TOPROL-XL) 50 MG 24 hr tablet TAKE 1 TABLET BY MOUTH ONCE DAILY WITH  OR  IMMEDIATELY  FOLLOWING  A  MEAL 09/27/21   Iran Ouch, MD  oxyCODONE-acetaminophen (PERCOCET/ROXICET) 5-325 MG tablet Take by mouth every 4 (four) hours as needed for severe pain.    [provider]  sertraline (ZOLOFT) 100 MG  tablet Take 200 mg by mouth daily. 03/04/20   [provider]  spironolactone (ALDACTONE) 25 MG tablet Take 1/2 (one-half) tablet by mouth once daily 09/27/20   Iran Ouch, MD    Social History   Socioeconomic History   Marital status: Married    Spouse name: Jimmy   Number of children: 2   Years of education: Not on file   Highest education level: Not on file  Occupational History   Not on file  Tobacco Use   Smoking status: Never   Smokeless tobacco: Never  Vaping Use   Vaping status: Never Used  Substance and Sexual Activity   Alcohol use: Yes    Alcohol/week: 0.0 standard drinks of alcohol    Comment: occassionally   Drug use: No   Sexual activity: Not Currently     Birth control/protection: Surgical  Other Topics Concern   Not on file  Social History Narrative   Not on file   Social Determinants of Health   Financial Resource Strain: Low Risk  (01/08/2022)   [facility] from [facility], [facility]   Overall Financial Resource Strain (CARDIA)    Difficulty of Paying Living Expenses: Not hard at all  Food Insecurity: No Food Insecurity (10/03/2022)   Hunger Vital Sign    Worried About Running Out of Food in the Last Year: Never true    Ran Out of Food in the Last Year: Never true  Transportation Needs: No Transportation Needs (10/03/2022)   PRAPARE - Administrator, Civil Service (Medical): No    Lack of Transportation (Non-Medical): No  Physical Activity: Not on file  Stress: Not on file  Social Connections: Not on file  Intimate Partner Violence: Not At Risk (10/03/2022)   Humiliation, Afraid, Rape, and Kick questionnaire    Fear of Current or Ex-Partner: No    Emotionally Abused: No    Physically Abused: No    Sexually Abused: No     Family History  Problem Relation Age of Onset   Hypertension Mother    Hyperlipidemia Mother    Stroke Mother    Colon cancer Mother    Breast cancer Sister 5   Ovarian cancer Sister    Throat cancer Brother     ROS: Otherwise negative unless mentioned in HPI  Physical Examination  Vitals:   10/03/22 1016 10/03/22 1017  BP:  114/84  Pulse: 86 88  Resp: (!) 24 (!) 25  Temp:    SpO2: 97% 92%   Body mass index is 39.47 kg/m.  General:  WDWN in NAD Gait: Not observed HENT: WNL, normocephalic Pulmonary: normal non-labored breathing, without Rales, rhonchi,  wheezing Cardiac: regular, without  Murmurs, rubs or gallops; without carotid bruits Abdomen: positive bowel sounds,  soft, NT/ND, no masses Skin: without rashes Vascular Exam/Pulses: Palpable pulses throughout. Extremities: without ischemic changes, without Gangrene , without cellulitis;  without open wounds;  Musculoskeletal: no muscle wasting or atrophy  Neurologic: A&O X 3;  No focal weakness or paresthesias are detected; speech is fluent/normal Psychiatric:  The pt has Normal affect. Lymph:  Unremarkable  CBC    Component Value Date/Time   WBC 5.9 10/03/2022 0629   RBC 4.60 10/03/2022 0629   HGB 14.2 10/03/2022 0629   HGB 13.0 10/06/2013 1006   HCT 42.7 10/03/2022 0629   HCT 41.0 06/01/2013 1003   PLT 127 (L) 10/03/2022 0629   PLT 165 06/01/2013 1003   MCV 92.8 10/03/2022 0629  MCV 90 06/01/2013 1003   MCH 30.9 10/03/2022 0629   MCHC 33.3 10/03/2022 0629   RDW 13.9 10/03/2022 0629   RDW 13.0 06/01/2013 1003   LYMPHSABS 1.6 03/01/2015 1212   LYMPHSABS 2.0 06/01/2013 1003   MONOABS 0.3 03/01/2015 1212   MONOABS 0.4 06/01/2013 1003   EOSABS 0.1 03/01/2015 1212   EOSABS 0.0 06/01/2013 1003   BASOSABS 0.0 03/01/2015 1212   BASOSABS 0.0 06/01/2013 1003    BMET    Component Value Date/Time   NA 139 10/03/2022 0629   NA 143 05/15/2021 1513   NA 137 06/01/2013 1003   K 3.4 (L) 10/03/2022 0629   K 3.6 06/01/2013 1003   CL 107 10/03/2022 0629   CL 104 06/01/2013 1003   CO2 25 10/03/2022 0629   CO2 29 06/01/2013 1003   GLUCOSE 106 (H) 10/03/2022 0629   GLUCOSE 107 (H) 06/01/2013 1003   BUN 13 10/03/2022 0629   BUN 17 05/15/2021 1513   BUN 11 06/01/2013 1003   CREATININE 0.75 10/03/2022 0629   CREATININE 0.69 06/01/2013 1003   CALCIUM 8.7 (L) 10/03/2022 0629   CALCIUM 9.2 06/01/2013 1003   GFRNONAA >60 10/03/2022 0629   GFRNONAA >60 06/01/2013 1003   GFRAA >60 04/10/2019 0238   GFRAA >60 06/01/2013 1003    COAGS: Lab Results  Component Value Date   INR 1.1 10/03/2022   INR 0.99 03/01/2015   INR 1.1 06/01/2013     Non-Invasive Vascular Imaging:   EXAM:10/03/22 CT ANGIOGRAPHY CHEST WITH CONTRAST   TECHNIQUE: Multidetector CT imaging of the chest was performed using the standard protocol during bolus administration of  intravenous contrast. Multiplanar CT image reconstructions and MIPs were obtained to evaluate the vascular anatomy.   RADIATION DOSE REDUCTION: This exam was performed according to the departmental dose-optimization program which includes automated exposure control, adjustment of the mA and/or kV according to patient size and/or use of iterative reconstruction technique.   CONTRAST:  75mL OMNIPAQUE IOHEXOL 350 MG/ML SOLN   COMPARISON:  Coronary CT scan from 05/22/2019.   FINDINGS: Cardiovascular: There are multiple occlusive and nonocclusive filling defects in the lobar, segmental and subsegmental pulmonary artery branches to bilateral lower lobes, inferior lingula and right upper lobe, compatible with at least moderate volume acute pulmonary embolism. There is mild flattening of the interventricular septum, suggesting right heart strain. No lung infarction.   Normal cardiac size. No pericardial effusion. No aortic aneurysm.   Mediastinum/Nodes: Visualized thyroid gland appears grossly unremarkable. No solid / cystic mediastinal masses. The esophagus is nondistended precluding optimal assessment. No axillary, mediastinal or hilar lymphadenopathy by size criteria.   Lungs/Pleura: The central tracheo-bronchial tree is patent. There are dependent changes in bilateral lungs. There are patchy areas of linear, plate-like atelectasis and/or scarring throughout bilateral lungs. No mass or consolidation. No pleural effusion or pneumothorax. No suspicious lung nodules.   Upper Abdomen: Visualized upper abdominal viscera within normal limits.   Musculoskeletal: The visualized soft tissues of the chest wall are grossly unremarkable. No suspicious osseous lesions. There are mild multilevel degenerative changes in the visualized spine.   Review of the MIP images confirms the above findings.   IMPRESSION: *Moderate volume acute pulmonary embolism with evidence of right heart strain. No  lung infarction. *Multiple other nonacute observations, as described above.  Statin:  No. Beta Blocker:  Yes.   Aspirin:  No. ACEI:  No. ARB:  Yes.   CCB use:  No Other antiplatelets/anticoagulants:  No.    ASSESSMENT/PLAN: This  is a 62 y.o. female who presents to [facility] emergency department with chief concerns of chest pain and shortness of breath.  She also reports some discomfort with radiation to her neck and her right shoulder.  She endorses more shortness of breath with exertion.  PLAN: Vascular surgery plans on taking the patient to the vascular lab this afternoon for pulmonary thrombectomy.  I discussed in detail with the patient the procedure, benefits, risks, and complications.  She verbalizes her understanding and wishes to proceed as soon as possible.  I answered all the patient's questions this afternoon.  Patient endorses that she has been n.p.o. since 11 AM yesterday.  She will continue to be made n.p.o. for procedure this afternoon.  Patient was started on a heparin drip in the emergency room.   -I discussed the plan in detail with Dr. Festus Barren MD and he is in agreement with the plan.   Marcie Bal [facility] 10/03/2022 2:58 PM

## 2022-10-03 NOTE — Op Note (Signed)
[facility]  Percutaneous Study/Intervention Procedural Note   Date of Surgery: 10/03/2022,5:00 PM  Surgeon: Festus Barren  Pre-operative Diagnosis: Symptomatic bilateral pulmonary emboli  Post-operative diagnosis:  Same  Procedure(s) Performed:  1.  Contrast injection right heart  2.  Thrombolysis with 8 mg of tPA, 4 mg in each of the main pulmonary arteries  3.  Mechanical thrombectomy using the penumbra CAT 12 catheter to the left lower lobe pulmonary artery and the right lower lobe, middle lobe, and upper lobe pulmonary arteries  4.  Selective catheter placement right lower lobe, middle lobe, and upper lobe pulmonary arteries  5.  Selective catheter placement left lower lobe and upper lobe pulmonary arteries    Anesthesia: Conscious sedation was administered under my direct supervision by the interventional radiology RN. IV Versed plus fentanyl were utilized. Continuous ECG, pulse oximetry and blood pressure was monitored throughout the entire procedure.  Versed and fentanyl were administered intravenously.  Conscious sedation was administered for a total of 34 minutes using 3 mg of Versed and a 7.5 mcg of Fentanyl.  EBL: 450 cc  Sheath: 14 French right femoral vein  Contrast: 50 cc   Fluoroscopy Time: 7.5 minutes  Indications:  Patient presents with pulmonary emboli. The patient is symptomatic with hypoxemia and dyspnea on exertion.  There is evidence of right heart strain on the CT angiogram. The patient is otherwise a good candidate for intervention and even the long-term benefits pulmonary angiography with thrombolysis is offered. The risks and benefits are reviewed long-term benefits are discussed. All questions are answered patient agrees to proceed.  Procedure:  Walburga Pawson Troxleris a 62 y.o. female who was identified and appropriate procedural time out was performed.  The patient was then placed supine on the table and prepped and draped in the usual  sterile fashion.  Ultrasound was used to evaluate the right common femoral vein.  It was patent, as it was echolucent and compressible.  A digital ultrasound image was acquired for the permanent record.  A Seldinger needle was used to access the right common femoral vein under direct ultrasound guidance.  A 0.035 J wire was advanced without resistance and a 5Fr sheath was placed and then upsized to an 14 [place] sheath.    The wire and pigtail catheter were then negotiated into the right atrium and bolus injection of contrast was utilized to demonstrate the right ventricle and the pulmonary artery outflow. The advantage wire and catheter were then negotiated into the main pulmonary artery and then into the left lower lobe and then left upper lobe pulmonary artery where hand injection of contrast was utilized to demonstrate the pulmonary arteries and confirm the locations of the pulmonary emboli.  There was extensive thrombus burden throughout the left lower lobe pulmonary artery with only a small amount of thrombus in the left upper lobe pulmonary artery.  I then transition to the right side with the help of the advantage wire and the catheter.  I cannulated first the right lower lobe, then the right middle and upper lobe pulmonary arteries were selective imaging was performed in each vessel. This demonstrated fairly extensive thrombus burden predominantly in the right middle and lower lobe pulmonary arteries with a smaller amount of thrombus in the right upper lobe pulmonary artery.  TPA was reconstituted and delivered onto the table. A total of 8 milligrams of TPA was utilized.  4 mg was administered on the left side and 4 mg was administered on the right  side. This was then allowed to dwell.  The Penumbra Cat 12 catheter was then advanced up into the pulmonary vasculature. The left lung was addressed first. Catheter was negotiated into the left lower lobe pulmonary artery and mechanical thrombectomy was  performed. Passes were made with both the Penumbra catheter itself as well as introducing the separator. Follow-up imaging was then performed.  Significant improvement was seen.  I elected not to go after the left upper lobe pulmonary artery due to the severe angle and expected blood loss from the right side after having lost about 200 cc during thrombectomy in the left lower lobe pulmonary artery.  The Penumbra Cat 12 catheter was then negotiated to the opposite side. The right lung was then addressed. Catheter was negotiated into the right middle lobe and mechanical thrombectomy was performed.  The separator was used.  Follow-up imaging demonstrated a good result and therefore the catheter was renegotiated into the right lower lobe pulmonary artery and again mechanical thrombectomy was performed. Passes were made with both the Penumbra catheter itself as well as introducing the separator. Follow-up imaging was then performed.  Again, significant improvement was seen.  I was then able to transition into the right upper lobe pulmonary artery where mechanical thrombectomy was performed with the penumbra CAT 12 catheter and the separator.  Completion imaging showed marked improvement.  After review these images wires were reintroduced and the catheter is removed. Then, the sheath is then pulled, proglide is secured, and pressure is held. A Monocryl was placed at the skin exit site and a sterile dressing was placed.    Findings:   Right heart imaging:  Right atrium and right ventricle and the pulmonary outflow tract appears somewhat dilated  Right lung:  This demonstrated fairly extensive thrombus burden predominantly in the right middle and lower lobe pulmonary arteries with a smaller amount of thrombus in the right upper lobe pulmonary artery.  Left lung:  There was extensive thrombus burden throughout the left lower lobe pulmonary artery with only a small amount of thrombus in the left upper lobe  pulmonary artery.     Disposition: Patient was taken to the recovery room in stable condition having tolerated the procedure well.  Leticia Coletta 10/03/2022,5:00 PM

## 2022-10-03 NOTE — Consult Note (Signed)
Hospital Consult    Reason for Consult:  Pulmonary Embolism Requesting Physician:  Dr Marrion Coy MD MRN #:  [id number]  History of Present Illness: This is a 62 y.o. female with history of hypertension, depression, GERD, hypothyroid, non-insulin-dependent diabetes mellitus, who presents emergency department for chief concerns of chest pain and shortness of breath. She reports some discomfort radiation to her neck and right shoulder. She reports that currently she is not having the same chest pressure she had with exertion. She reports it still feels tight there.   Upon workup patient had CTA of the chest this afternoon.  Impression was she has moderate volume acute pulmonary embolism with evidence of right heart strain.  No lung infarction.  Vascular surgery was consulted to evaluate for pulmonary thrombectomy.  Past Medical History:  Diagnosis Date   AICD (automatic cardioverter/defibrillator) present 2008   a.) Medtronic device placed 2008. b.) replaced 06/21/2013 (Medtronic Evera XT single-chamber ICD; model number [id number]).   Anemia    Anxiety    Aortic dilatation (HCC) 03/24/2019   a.) TTE 03/24/2019: Ao root measured 37 mm. b.) TTE 03/21/2020: Ao root measured 37 mm.   Arthritis    Depression    Difficult intubation    DOE (dyspnea on exertion)    Endometriosis of vagina 09/2013   Intra-operative findings of endometriosis implants on cervical stump   Essential hypertension    GERD (gastroesophageal reflux disease)    Headache    HFrEF (heart failure with reduced ejection fraction) (HCC)    a.) TTE 02/18/2014: EF 40-45%; tiv AR, mild MR/TR/PR; G1DD. b.) TTE 02/22/2017: EF 45-50%; mild LVH; triv PR; G1DD. c.) TTE 03/24/2019: EF 45-50%; glob HK, mild LVH; triv MR/TR/PR. d.) TTE 03/21/2020: EF 40-45%; glob HK.   History of 2019 novel coronavirus disease (COVID-19) 02/02/2020   History of 2019 novel coronavirus disease (COVID-19) 02/02/2020   Hypokalemia    Hypothyroidism     a.) s/p radioactive iodine Tx; on levothyroxine   Mixed hyperlipidemia    Nonischemic cardiomyopathy (HCC)    a.) TTE 02/18/2014: EF 40-45%. b.) TTE 02/22/2017: EF 45-50%. c.) TTE 03/24/2019: EF 45-50%. d.) TTE 03/21/2020: EF 40-45%   OSA on CPAP    Panic attacks    PSVT (paroxysmal supraventricular tachycardia) 03/30/2020   a.) Holter 03/30/2020 --> 2 runs; fastest lasting 4 beats (184 bpm); longest lasting 4 beats (113 bpm)   Tachycardia     Past Surgical History:  Procedure Laterality Date   ANTERIOR AND POSTERIOR REPAIR N/A 11/25/2017   Procedure: ANTERIOR (CYSTOCELE) AND POSTERIOR REPAIR (RECTOCELE);  Surgeon: Hildred Laser, MD;  Location: [facility] ORS;  Service: Gynecology;  Laterality: N/A;   BREAST CYST ASPIRATION Left 03/22/2014   neg/ done by Dr Evette Cristal   CARDIAC CATHETERIZATION  2003   [facility]: No significant coronary artery disease with reduced ejection fraction.   CARDIAC DEFIBRILLATOR PLACEMENT  04/2006   replaced 05/2013   CARDIAC DEFIBRILLATOR PLACEMENT  06/21/2013   Procedure:  CARDIAC DEFIBRILLATOR PLACEMENT (Medtronic single-chamber ICD, model number Lianne Moris, model number [id number]): Location: [facility]; Surgeon: Gerre Pebbles, MD   CARPAL TUNNEL RELEASE Right    COLONOSCOPY  2015   COLONOSCOPY N/A 10/13/2020   Procedure: COLONOSCOPY;  Surgeon: Jaynie Collins, DO;  Location: [facility] ENDOSCOPY;  Service: Gastroenterology;  Laterality: N/A;   COLONOSCOPY WITH PROPOFOL N/A 10/04/2015   Procedure: COLONOSCOPY WITH PROPOFOL;  Surgeon: Midge Minium, MD;  Location: [facility] ENDOSCOPY;  Service: Endoscopy;  Laterality: N/A;   CORONARY ANGIOPLASTY  CYSTOSCOPY  11/15/2014   Procedure: CYSTOSCOPY;  Surgeon: Hildred Laser, MD;  Location: [facility] ORS;  Service: Gynecology;;   DIAGNOSTIC LAPAROSCOPY     ETHMOIDECTOMY Right 07/19/2021   Procedure: TOTAL ETHMOIDECTOMY WITH FRONTAL SINUS EXPLORATION;  Surgeon: Vernie Murders, MD;  Location: [facility] ORS;  Service: ENT;  Laterality: Right;    FOOT SURGERY Bilateral    heer spur and bunion   IMAGE GUIDED SINUS SURGERY N/A 07/19/2021   Procedure: IMAGE GUIDED SINUS SURGERY;  Surgeon: Vernie Murders, MD;  Location: [facility] ORS;  Service: ENT;  Laterality: N/A;   KNEE ARTHROSCOPY Left 03/02/2015   Procedure: ARTHROSCOPY  LEFT KNEE, PARTIAL LATERAL  MENISECTOMY, SYNOVECTOMY, MEDIAL & LATERAL CHONDROPLASTY;  Surgeon: Juanell Fairly, MD;  Location: [facility] OR[facility];  Service: Orthopedics;  Laterality: Left;   LAPAROSCOPIC SALPINGO OOPHERECTOMY Left 11/15/2014   Procedure: LAPAROSCOPIC OOPHORECTOMY;  Surgeon: Hildred Laser, MD;  Location: [facility] ORS;  Service: Gynecology;  Laterality: Left;   LAPAROSCOPIC SALPINGOOPHERECTOMY Right 09/2013   also with left salpingectomy   LEFT HEART CATH AND CORONARY ANGIOGRAPHY N/A 03/28/2020   Procedure: LEFT HEART CATH AND CORONARY ANGIOGRAPHY;  Surgeon: Iran Ouch, MD;  Location: [facility] INVASIVE CV LAB;  Service: Cardiovascular;  Laterality: N/A;   MAXILLARY ANTROSTOMY Right 07/19/2021   Procedure: MAXILLARY ANTROSTOMY WITH REMOVAL TISSUE;  Surgeon: Vernie Murders, MD;  Location: [facility] OR[facility];  Service: ENT;  Laterality: Right;   MINOR HEMORRHOIDECTOMY     NASAL TURBINATE REDUCTION Bilateral 07/19/2021   Procedure: INFERIOR TURBINATE REDUCTION;  Surgeon: Vernie Murders, MD;  Location: [facility];  Service: ENT;  Laterality: Bilateral;   SEPTOPLASTY N/A 07/19/2021   Procedure: SEPTOPLASTY;  Surgeon: Vernie Murders, MD;  Location: [facility] OR[facility];  Service: ENT;  Laterality: N/A;   TONSILLECTOMY     TOTAL ABDOMINAL HYSTERECTOMY     TRACHELECTOMY N/A 11/15/2014   Procedure: TRACHELECTOMY;  Surgeon: Hildred Laser, MD;  Location: [facility] ORS;  Service: Gynecology;  Laterality: N/A;   TRIGGER FINGER RELEASE Right    TUBAL LIGATION Bilateral     Allergies  Allergen Reactions   Sulfa Antibiotics Hives   Sulfamethoxazole-Trimethoprim Hives    Prior to Admission medications   Medication Sig Start Date End Date Taking? Authorizing  Provider  ENTRESTO 24-26 MG Take 1 tablet by mouth 2 (two) times daily. 06/19/22  Yes [provider]  meloxicam (MOBIC) 15 MG tablet Take 15 mg by mouth daily with breakfast. 02/28/22  Yes [provider]  Acetaminophen (TYLENOL ARTHRITIS PAIN PO) Take by mouth.    [provider]  esomeprazole (NEXIUM) 40 MG capsule Take 40 mg by mouth every morning.     [provider]  fluocinonide-emollient (LIDEX-E) 0.05 % cream Apply 1 Application topically 2 (two) times daily. 04/18/22   Immordino, Jeannett Senior, FNP  gabapentin (NEURONTIN) 100 MG capsule Take 100 mg by mouth 3 (three) times daily. 02/16/21   [provider]  JARDIANCE 10 MG TABS tablet TAKE 1 TABLET BY MOUTH ONCE DAILY BEFORE BREAKFAST 05/08/21   Iran Ouch, MD  levothyroxine (SYNTHROID) 125 MCG tablet Take 125 mcg by mouth every morning. 02/27/21   [provider]  metoprolol succinate (TOPROL-XL) 50 MG 24 hr tablet TAKE 1 TABLET BY MOUTH ONCE DAILY WITH  OR  IMMEDIATELY  FOLLOWING  A  MEAL 09/27/21   Iran Ouch, MD  oxyCODONE-acetaminophen (PERCOCET/ROXICET) 5-325 MG tablet Take by mouth every 4 (four) hours as needed for severe pain.    [provider]  sertraline (ZOLOFT) 100 MG  tablet Take 200 mg by mouth daily. 03/04/20   [provider]  spironolactone (ALDACTONE) 25 MG tablet Take 1/2 (one-half) tablet by mouth once daily 09/27/20   Iran Ouch, MD    Social History   Socioeconomic History   Marital status: Married    Spouse name: Jimmy   Number of children: 2   Years of education: Not on file   Highest education level: Not on file  Occupational History   Not on file  Tobacco Use   Smoking status: Never   Smokeless tobacco: Never  Vaping Use   Vaping status: Never Used  Substance and Sexual Activity   Alcohol use: Yes    Alcohol/week: 0.0 standard drinks of alcohol    Comment: occassionally   Drug use: No   Sexual activity: Not Currently     Birth control/protection: Surgical  Other Topics Concern   Not on file  Social History Narrative   Not on file   Social Determinants of Health   Financial Resource Strain: Low Risk  (01/08/2022)   [facility] from [facility], [facility]   Overall Financial Resource Strain (CARDIA)    Difficulty of Paying Living Expenses: Not hard at all  Food Insecurity: No Food Insecurity (10/03/2022)   Hunger Vital Sign    Worried About Running Out of Food in the Last Year: Never true    Ran Out of Food in the Last Year: Never true  Transportation Needs: No Transportation Needs (10/03/2022)   PRAPARE - Administrator, Civil Service (Medical): No    Lack of Transportation (Non-Medical): No  Physical Activity: Not on file  Stress: Not on file  Social Connections: Not on file  Intimate Partner Violence: Not At Risk (10/03/2022)   Humiliation, Afraid, Rape, and Kick questionnaire    Fear of Current or Ex-Partner: No    Emotionally Abused: No    Physically Abused: No    Sexually Abused: No     Family History  Problem Relation Age of Onset   Hypertension Mother    Hyperlipidemia Mother    Stroke Mother    Colon cancer Mother    Breast cancer Sister 5   Ovarian cancer Sister    Throat cancer Brother     ROS: Otherwise negative unless mentioned in HPI  Physical Examination  Vitals:   10/03/22 1016 10/03/22 1017  BP:  114/84  Pulse: 86 88  Resp: (!) 24 (!) 25  Temp:    SpO2: 97% 92%   Body mass index is 39.47 kg/m.  General:  WDWN in NAD Gait: Not observed HENT: WNL, normocephalic Pulmonary: normal non-labored breathing, without Rales, rhonchi,  wheezing Cardiac: regular, without  Murmurs, rubs or gallops; without carotid bruits Abdomen: positive bowel sounds,  soft, NT/ND, no masses Skin: without rashes Vascular Exam/Pulses: Palpable pulses throughout. Extremities: without ischemic changes, without Gangrene , without cellulitis;  without open wounds;  Musculoskeletal: no muscle wasting or atrophy  Neurologic: A&O X 3;  No focal weakness or paresthesias are detected; speech is fluent/normal Psychiatric:  The pt has Normal affect. Lymph:  Unremarkable  CBC    Component Value Date/Time   WBC 5.9 10/03/2022 0629   RBC 4.60 10/03/2022 0629   HGB 14.2 10/03/2022 0629   HGB 13.0 10/06/2013 1006   HCT 42.7 10/03/2022 0629   HCT 41.0 06/01/2013 1003   PLT 127 (L) 10/03/2022 0629   PLT 165 06/01/2013 1003   MCV 92.8 10/03/2022 0629  MCV 90 06/01/2013 1003   MCH 30.9 10/03/2022 0629   MCHC 33.3 10/03/2022 0629   RDW 13.9 10/03/2022 0629   RDW 13.0 06/01/2013 1003   LYMPHSABS 1.6 03/01/2015 1212   LYMPHSABS 2.0 06/01/2013 1003   MONOABS 0.3 03/01/2015 1212   MONOABS 0.4 06/01/2013 1003   EOSABS 0.1 03/01/2015 1212   EOSABS 0.0 06/01/2013 1003   BASOSABS 0.0 03/01/2015 1212   BASOSABS 0.0 06/01/2013 1003    BMET    Component Value Date/Time   NA 139 10/03/2022 0629   NA 143 05/15/2021 1513   NA 137 06/01/2013 1003   K 3.4 (L) 10/03/2022 0629   K 3.6 06/01/2013 1003   CL 107 10/03/2022 0629   CL 104 06/01/2013 1003   CO2 25 10/03/2022 0629   CO2 29 06/01/2013 1003   GLUCOSE 106 (H) 10/03/2022 0629   GLUCOSE 107 (H) 06/01/2013 1003   BUN 13 10/03/2022 0629   BUN 17 05/15/2021 1513   BUN 11 06/01/2013 1003   CREATININE 0.75 10/03/2022 0629   CREATININE 0.69 06/01/2013 1003   CALCIUM 8.7 (L) 10/03/2022 0629   CALCIUM 9.2 06/01/2013 1003   GFRNONAA >60 10/03/2022 0629   GFRNONAA >60 06/01/2013 1003   GFRAA >60 04/10/2019 0238   GFRAA >60 06/01/2013 1003    COAGS: Lab Results  Component Value Date   INR 1.1 10/03/2022   INR 0.99 03/01/2015   INR 1.1 06/01/2013     Non-Invasive Vascular Imaging:   EXAM:10/03/22 CT ANGIOGRAPHY CHEST WITH CONTRAST   TECHNIQUE: Multidetector CT imaging of the chest was performed using the standard protocol during bolus administration of  intravenous contrast. Multiplanar CT image reconstructions and MIPs were obtained to evaluate the vascular anatomy.   RADIATION DOSE REDUCTION: This exam was performed according to the departmental dose-optimization program which includes automated exposure control, adjustment of the mA and/or kV according to patient size and/or use of iterative reconstruction technique.   CONTRAST:  75mL OMNIPAQUE IOHEXOL 350 MG/ML SOLN   COMPARISON:  Coronary CT scan from 05/22/2019.   FINDINGS: Cardiovascular: There are multiple occlusive and nonocclusive filling defects in the lobar, segmental and subsegmental pulmonary artery branches to bilateral lower lobes, inferior lingula and right upper lobe, compatible with at least moderate volume acute pulmonary embolism. There is mild flattening of the interventricular septum, suggesting right heart strain. No lung infarction.   Normal cardiac size. No pericardial effusion. No aortic aneurysm.   Mediastinum/Nodes: Visualized thyroid gland appears grossly unremarkable. No solid / cystic mediastinal masses. The esophagus is nondistended precluding optimal assessment. No axillary, mediastinal or hilar lymphadenopathy by size criteria.   Lungs/Pleura: The central tracheo-bronchial tree is patent. There are dependent changes in bilateral lungs. There are patchy areas of linear, plate-like atelectasis and/or scarring throughout bilateral lungs. No mass or consolidation. No pleural effusion or pneumothorax. No suspicious lung nodules.   Upper Abdomen: Visualized upper abdominal viscera within normal limits.   Musculoskeletal: The visualized soft tissues of the chest wall are grossly unremarkable. No suspicious osseous lesions. There are mild multilevel degenerative changes in the visualized spine.   Review of the MIP images confirms the above findings.   IMPRESSION: *Moderate volume acute pulmonary embolism with evidence of right heart strain. No  lung infarction. *Multiple other nonacute observations, as described above.  Statin:  No. Beta Blocker:  Yes.   Aspirin:  No. ACEI:  No. ARB:  Yes.   CCB use:  No Other antiplatelets/anticoagulants:  No.    ASSESSMENT/PLAN: This  is a 62 y.o. female who presents to [facility] emergency department with chief concerns of chest pain and shortness of breath.  She also reports some discomfort with radiation to her neck and her right shoulder.  She endorses more shortness of breath with exertion.  PLAN: Vascular surgery plans on taking the patient to the vascular lab this afternoon for pulmonary thrombectomy.  I discussed in detail with the patient the procedure, benefits, risks, and complications.  She verbalizes her understanding and wishes to proceed as soon as possible.  I answered all the patient's questions this afternoon.  Patient endorses that she has been n.p.o. since 11 AM yesterday.  She will continue to be made n.p.o. for procedure this afternoon.  Patient was started on a heparin drip in the emergency room.   -I discussed the plan in detail with Dr. Festus Barren MD and he is in agreement with the plan.   Marcie Bal [facility] 10/03/2022 2:58 PM

## 2022-10-03 NOTE — Progress Notes (Signed)
  Progress Note   Patient: Lisa Fuentes:[id number] DOB: Jan 27, 1961 DOA: 10/02/2022     1 DOS: the patient was seen and examined on 10/03/2022   Brief hospital course: Ms. Lisa Fuentes is a 62 year old female with history of hypertension, depression, GERD, hypothyroid, non-insulin-dependent diabetes mellitus, who presents emergency department for chief concerns of chest pain and shortness of breath. High sensitive troponin is 52.  CT angiogram showed a moderate amount of PE with right heart strain.  Heparin is continued.  Vascular surgery consulted.    Principal Problem:   Chest pain Active Problems:   Essential hypertension   Hypercholesteremia   ICD (implantable cardioverter-defibrillator) in place   Sleep apnea   Dilated cardiomyopathy (HCC)   Mixed hyperlipidemia   Anxiety   Chronic pain syndrome   Depression   HFrEF (heart failure with reduced ejection fraction) (HCC)   Morbid obesity (HCC)   Combined congestive systolic and diastolic heart failure (HCC)   NSTEMI (non-ST elevated myocardial infarction) (HCC)   Assessment and Plan: Pulmonary emboli with right heart strain. Elevated troponin secondary to PE. Patient has been having leg edema, had chest pain on admission.  Elevated D-dimer.  CT angiogram showed PE.  Continue heparin for today. CT also showed evidence of right heart strain, vascular surgery consult obtained.  Patient will be considered for intervention.  Chronic combined congestive systolic and diastolic heart failure (HCC) Dilated cardiomyopathy. Reviewed cardiac echo on 03/21/2020: Estimated ejection fraction is 40 to 45%, grade 1 diastolic dysfunction Resume home dose medicines. Resume the prior chronic angiogram performed 2 years ago, no history of coronary disease.  Morbid obesity (HCC) This complicates overall care and prognosis.   Chronic pain syndrome Resume home treatment.  Anxiety Ativan 0.5 mg IV every 6 hours.  For anxiety, 1 day  ordered   Sleep apnea CPAP nightly ordered  Essential hypertension Metoprolol succinate 50 mg daily resumed       Subjective:  Patient still has some chest pain, also has some pain in the legs.  Physical Exam: Vitals:   10/03/22 0600 10/03/22 1015 10/03/22 1016 10/03/22 1017  BP: 113/80   114/84  Pulse: (!) 113  86 88  Resp: (!) 24  (!) 24 (!) 25  Temp:  98.4 F (36.9 C)    TempSrc:      SpO2: 96%  97% 92%  Weight:      Height:       General exam: Appears calm and comfortable, morbidly obese. Respiratory system: Clear to auscultation. Respiratory effort normal. Cardiovascular system: S1 & S2 heard, RRR. No JVD, murmurs, rubs, gallops or clicks. No pedal edema. Gastrointestinal system: Abdomen is nondistended, soft and nontender. No organomegaly or masses felt. Normal bowel sounds heard. Central nervous system: Alert and oriented. No focal neurological deficits. Extremities: Symmetric 5 x 5 power. Skin: No rashes, lesions or ulcers Psychiatry: Judgement and insight appear normal. Mood & affect appropriate.    Data Reviewed:  Reviewed lab results, CT scan results.  Family Communication: None  Disposition: Status is: Inpatient Remains inpatient appropriate because: Severity of disease, IV treatment.     Time spent: 50 minutes  Author: Marrion Coy, MD 10/03/2022 2:32 PM  For on call review [email or web address].

## 2022-10-03 NOTE — ED Notes (Signed)
Pt's oxygen kept dipping down to 89-90% on RA. Pt admits to having sleep apnea. Placed on 2L 

## 2022-10-04 ENCOUNTER — Other Ambulatory Visit (HOSPITAL_COMMUNITY): Payer: Self-pay

## 2022-10-04 ENCOUNTER — Other Ambulatory Visit: Payer: Self-pay

## 2022-10-04 ENCOUNTER — Encounter: Payer: Self-pay | Admitting: Vascular Surgery

## 2022-10-04 DIAGNOSIS — I2699 Other pulmonary embolism without acute cor pulmonale: Secondary | ICD-10-CM | POA: Diagnosis not present

## 2022-10-04 DIAGNOSIS — R7989 Other specified abnormal findings of blood chemistry: Secondary | ICD-10-CM | POA: Diagnosis not present

## 2022-10-04 DIAGNOSIS — I5023 Acute on chronic systolic (congestive) heart failure: Secondary | ICD-10-CM | POA: Diagnosis not present

## 2022-10-04 DIAGNOSIS — I5042 Chronic combined systolic (congestive) and diastolic (congestive) heart failure: Secondary | ICD-10-CM | POA: Diagnosis not present

## 2022-10-04 DIAGNOSIS — R079 Chest pain, unspecified: Secondary | ICD-10-CM | POA: Diagnosis not present

## 2022-10-04 DIAGNOSIS — R000 Tachycardia, unspecified: Secondary | ICD-10-CM

## 2022-10-04 DIAGNOSIS — I420 Dilated cardiomyopathy: Secondary | ICD-10-CM | POA: Diagnosis not present

## 2022-10-04 DIAGNOSIS — G4733 Obstructive sleep apnea (adult) (pediatric): Secondary | ICD-10-CM | POA: Diagnosis not present

## 2022-10-04 DIAGNOSIS — I2609 Other pulmonary embolism with acute cor pulmonale: Secondary | ICD-10-CM | POA: Diagnosis not present

## 2022-10-04 LAB — NM MYOCAR MULTI W/SPECT W/WALL MOTION / EF
Estimated workload: 1
Exercise duration (min): 1 min
Exercise duration (sec): 0 s
LV dias vol: 107 mL (ref 46–106)
LV sys vol: 60 mL
MPHR: 110 {beats}/min
Nuc Stress EF: 44 %
Peak HR: 86 {beats}/min
Percent HR: 68 %
Rest HR: 81 {beats}/min
Rest Nuclear Isotope Dose: 10.3 mCi
SDS: 3
SRS: 6
SSS: 12
ST Depression (mm): 0 mm
Stress Nuclear Isotope Dose: 31.6 mCi
TID: 1.1

## 2022-10-04 LAB — BASIC METABOLIC PANEL
Anion gap: 9 (ref 5–15)
BUN: 13 mg/dL (ref 6–20)
CO2: 22 mmol/L (ref 22–32)
Calcium: 8.8 mg/dL — ABNORMAL LOW (ref 8.9–10.3)
Chloride: 102 mmol/L (ref 98–111)
Creatinine, Ser: 0.85 mg/dL (ref 0.44–1.00)
GFR, Estimated: >60 mL/min (ref >60)
Glucose, Bld: 103 mg/dL — ABNORMAL HIGH (ref 70–99)
Potassium: 3.8 mmol/L (ref 3.5–5.1)
Sodium: 133 mmol/L — ABNORMAL LOW (ref 135–145)

## 2022-10-04 LAB — CBC
HCT: 39.6 % (ref 36.0–46.0)
Hemoglobin: 12.8 g/dL (ref 12.0–15.0)
MCH: 30.4 pg (ref 26.0–34.0)
MCHC: 32.3 g/dL (ref 30.0–36.0)
MCV: 94.1 fL (ref 80.0–100.0)
Platelets: 115 10*3/uL — ABNORMAL LOW (ref 150–400)
RBC: 4.21 MIL/uL (ref 3.87–5.11)
RDW: 14 % (ref 11.5–15.5)
WBC: 7.3 10*3/uL (ref 4.0–10.5)
nRBC: 0 % (ref 0.0–0.2)

## 2022-10-04 LAB — HEPARIN LEVEL (UNFRACTIONATED): Heparin Unfractionated: 0.69 [IU]/mL (ref 0.30–0.70)

## 2022-10-04 MED ORDER — APIXABAN 5 MG PO TABS: ORAL_TABLET | ORAL | 0 refills | Status: DC

## 2022-10-04 MED ORDER — SERTRALINE HCL 50 MG PO TABS
200.0000 mg | ORAL_TABLET | Freq: Every day | ORAL | Status: DC
  Administered 2022-10-04: 200 mg via ORAL
  Filled 2022-10-04: qty 4

## 2022-10-04 MED ORDER — APIXABAN 5 MG PO TABS: 5.0000 mg | ORAL_TABLET | Freq: Two times a day (BID) | ORAL | Status: DC

## 2022-10-04 MED ORDER — APIXABAN 5 MG PO TABS
10.0000 mg | ORAL_TABLET | Freq: Two times a day (BID) | ORAL | Status: DC
  Administered 2022-10-04: 10 mg via ORAL
  Filled 2022-10-04: qty 2

## 2022-10-04 MED ORDER — SACUBITRIL-VALSARTAN 24-26 MG PO TABS
1.0000 | ORAL_TABLET | Freq: Two times a day (BID) | ORAL | Status: DC
  Administered 2022-10-04: 1 via ORAL
  Filled 2022-10-04: qty 1

## 2022-10-04 MED ORDER — CYCLOBENZAPRINE HCL 10 MG PO TABS: 10.0000 mg | ORAL_TABLET | Freq: Three times a day (TID) | ORAL | Status: DC | PRN

## 2022-10-04 NOTE — Progress Notes (Signed)
ANTICOAGULATION CONSULT NOTE  Pharmacy Consult for heparin infusion Indication: ACS/STEMI  Allergies  Allergen Reactions   Sulfa Antibiotics Hives   Sulfamethoxazole-Trimethoprim Hives    Patient Measurements: Height: 5\' 7"  (170.2 cm) Weight: 114.3 kg (252 lb) IBW/kg (Calculated) : 61.6 Heparin Dosing Weight: 88.2 kg  Vital Signs: Temp: 98.7 F (37.1 C) (09/05 0102) Temp Source: Oral (09/05 0102) BP: 98/66 (09/05 0102) Pulse Rate: 88 (09/05 0102)  Labs: Recent Labs    10/02/22 1748 10/02/22 1748 10/02/22 2146 10/03/22 0019 10/03/22 0629 10/03/22 0630 10/03/22 2306 10/04/22 0422  HGB 14.9  --   --   --  14.2  --   --  12.8  HCT 45.8  --   --   --  42.7  --   --  39.6  PLT 139*  --   --   --  127*  --   --  115*  APTT  --   --   --  151*  --  150*  --   --   LABPROT  --   --   --  14.9  --   --   --   --   INR  --   --   --  1.1  --   --   --   --   HEPARINUNFRC  --    < >  --  0.71*  --  0.66 0.52 0.69  CREATININE 0.99  --   --   --  0.75  --   --   --   TROPONINIHS 52*  --  126*  --   --   --   --   --    < > = values in this interval not displayed.    Estimated Creatinine Clearance: 97.6 mL/min (by C-G formula based on SCr of 0.75 mg/dL).   Medical History: Past Medical History:  Diagnosis Date   AICD (automatic cardioverter/defibrillator) present 2008   a.) Medtronic device placed 2008. b.) replaced 06/21/2013 (Medtronic Evera XT single-chamber ICD; model number [id number]).   Anemia    Anxiety    Aortic dilatation (HCC) 03/24/2019   a.) TTE 03/24/2019: Ao root measured 37 mm. b.) TTE 03/21/2020: Ao root measured 37 mm.   Arthritis    Depression    Difficult intubation    DOE (dyspnea on exertion)    Endometriosis of vagina 09/2013   Intra-operative findings of endometriosis implants on cervical stump   Essential hypertension    GERD (gastroesophageal reflux disease)    Headache    HFrEF (heart failure with reduced ejection fraction) (HCC)    a.)  TTE 02/18/2014: EF 40-45%; tiv AR, mild MR/TR/PR; G1DD. b.) TTE 02/22/2017: EF 45-50%; mild LVH; triv PR; G1DD. c.) TTE 03/24/2019: EF 45-50%; glob HK, mild LVH; triv MR/TR/PR. d.) TTE 03/21/2020: EF 40-45%; glob HK.   History of 2019 novel coronavirus disease (COVID-19) 02/02/2020   History of 2019 novel coronavirus disease (COVID-19) 02/02/2020   Hypokalemia    Hypothyroidism    a.) s/p radioactive iodine Tx; on levothyroxine   Mixed hyperlipidemia    Nonischemic cardiomyopathy (HCC)    a.) TTE 02/18/2014: EF 40-45%. b.) TTE 02/22/2017: EF 45-50%. c.) TTE 03/24/2019: EF 45-50%. d.) TTE 03/21/2020: EF 40-45%   OSA on CPAP    Panic attacks    PSVT (paroxysmal supraventricular tachycardia) 03/30/2020   a.) Holter 03/30/2020 --> 2 runs; fastest lasting 4 beats (184 bpm); longest lasting 4 beats (113 bpm)   Tachycardia  Assessment: Pt is a 62 yo female with hx of multiple DVTs of the right upper extremity. CTA chest showed left lower lobe segmental and subsegmental pulmonary emboli without evidence of right heart strain. The patient was started on Eliquis. She completed several months of treatment of Eliquis, and it was recently discontinued per hematology as reported on 09/18/22. Pt presenting to ED c/o CP and SOB, found with elevated Troponin I level, trending up.  Baseline labs: Hgb 14.9  Plt 139 INR 1.1   aPTT 151  HL 0.71   (drawn @ 0019- after heparin drip was already hung @ 2327)   0904 0630  HL 0.66  therapeutic x 1 0904 2306 HL 0.52, therapeutic x 2 0905 0422 HL 0.69, therapeutic x 3   Goal of Therapy:  Heparin level 0.3-0.7 units/ml Monitor platelets by anticoagulation protocol: Yes   Plan: Will continue heparin infusion at 1200 units/hr Recheck HL daily w/ AM labs while therapeutic  CBC daily while on heparin  Otelia Sergeant, PharmD, [facility] 10/04/2022 5:30 AM

## 2022-10-04 NOTE — Discharge Summary (Addendum)
Physician Discharge Summary   Patient: Lisa Fuentes MRN: [id number] DOB: [birth year]  Admit date:     10/02/2022  Discharge date: 10/04/22  Discharge Physician: Marrion Coy   PCP: Jerl Mina, MD   Recommendations at discharge:   Follow-up with PCP in 1 week. Follow-up with vascular surgery in 1 month.  Discharge Diagnoses: Principal Problem:   Chest pain Active Problems:   Chronic combined systolic (congestive) and diastolic (congestive) heart failure (HCC)   Essential hypertension   Hypercholesteremia   ICD (implantable cardioverter-defibrillator) in place   Sleep apnea   Dilated cardiomyopathy (HCC)   Mixed hyperlipidemia   Anxiety   Chronic pain syndrome   Depression   Morbid obesity (HCC)   Pulmonary emboli (HCC)   Elevated troponin Thrombocytopenia secondary to PE. Mild hyponatremia. Resolved Problems:   * No resolved hospital problems. * Hypokalemia.  Hospital Course: Lisa Fuentes is a 62 year old female with history of hypertension, depression, GERD, hypothyroid, non-insulin-dependent diabetes mellitus, who presents emergency department for chief concerns of chest pain and shortness of breath. High sensitive troponin is 52.  CT angiogram showed a moderate amount of PE with right heart strain.  Heparin is continued.  Patient had pulmonary thrombectomy performed on 9/4.  Condition much improved today, medically stable to be discharged.  Patient will be treated with Eliquis.  Since the second time patient had DVT/PE, she need lifetime anticoagulation.  Assessment and Plan: Acute pulmonary emboli with right heart strain. Elevated troponin secondary to PE. Patient has been having leg edema, had chest pain on admission.  Elevated D-dimer.  CT angiogram showed PE with right heart strain, patient is status post pulmonary thrombectomy.  Condition improved, currently symptom-free, medically stable for discharge.   Chronic combined congestive systolic and diastolic  heart failure (HCC) Dilated cardiomyopathy. Reviewed cardiac echo on 03/21/2020: Estimated ejection fraction is 40 to 45%, grade 1 diastolic dysfunction Resume home dose medicines. Resume the prior chronic angiogram performed 2 years ago, no history of coronary disease.   Morbid obesity (HCC) This complicates overall care and prognosis.    Chronic pain syndrome Resume home treatment.   Anxiety Resume home treatment.       Consultants: Vascular surgery and cardiology. Procedures performed: Pulmonary thrombectomy. Disposition: Home Diet recommendation:  Discharge Diet Orders (From admission, onward)     Start     Ordered   10/04/22 0000  Diet - low sodium heart healthy        10/04/22 0958           Cardiac diet DISCHARGE MEDICATION: Allergies as of 10/04/2022       Reactions   Sulfa Antibiotics Hives   Sulfamethoxazole-trimethoprim Hives        Medication List     STOP taking these medications    meloxicam 15 MG tablet Commonly known as: MOBIC       TAKE these medications    apixaban 5 MG Tabs tablet Commonly known as: ELIQUIS Take 2 tablets (10 mg total) by mouth 2 (two) times daily for 7 days, THEN 1 tablet (5 mg total) 2 (two) times daily. Start taking on: October 04, 2022   cyclobenzaprine 10 MG tablet Commonly known as: FLEXERIL Take 1 tablet (10 mg total) by mouth 3 (three) times daily as needed for muscle spasms.   Entresto 24-26 MG Generic drug: sacubitril-valsartan Take 1 tablet by mouth 2 (two) times daily.   esomeprazole 40 MG capsule Commonly known as: NEXIUM Take 40 mg by mouth every morning.  fluocinonide-emollient 0.05 % cream Commonly known as: LIDEX-E Apply 1 Application topically 2 (two) times daily.   gabapentin 100 MG capsule Commonly known as: NEURONTIN Take 100 mg by mouth 3 (three) times daily.   Jardiance 10 MG Tabs tablet Generic drug: empagliflozin TAKE 1 TABLET BY MOUTH ONCE DAILY BEFORE BREAKFAST    levothyroxine 125 MCG tablet Commonly known as: SYNTHROID Take 125 mcg by mouth every morning.   metoprolol succinate 50 MG 24 hr tablet Commonly known as: TOPROL-XL TAKE 1 TABLET BY MOUTH ONCE DAILY WITH  OR  IMMEDIATELY  FOLLOWING  A  MEAL   oxyCODONE-acetaminophen 5-325 MG tablet Commonly known as: PERCOCET/ROXICET Take 1 tablet by mouth every 4 (four) hours as needed for severe pain.   sertraline 100 MG tablet Commonly known as: ZOLOFT Take 200 mg by mouth daily.   spironolactone 25 MG tablet Commonly known as: ALDACTONE Take 1/2 (one-half) tablet by mouth once daily   TYLENOL ARTHRITIS PAIN PO Take 1,300 mg by mouth every 6 (six) hours as needed (Pain).        Follow-up Information     Jerl Mina, MD Follow up in 1 week(s).   Specialty: Family Medicine Contact information: [address] [facility] [place] [address] [phone]         Annice Needy, MD Follow up in 1 month(s).   Specialties: Vascular Surgery, Radiology, Interventional Cardiology Contact information: [address] [place] [address] [phone]                Discharge Exam: Ceasar Mons Weights   10/02/22 1745  Weight: 114.3 kg   General exam: Appears calm and comfortable, obese Respiratory system: Clear to auscultation. Respiratory effort normal. Cardiovascular system: S1 & S2 heard, RRR. No JVD, murmurs, rubs, gallops or clicks. No pedal edema. Gastrointestinal system: Abdomen is nondistended, soft and nontender. No organomegaly or masses felt. Normal bowel sounds heard. Central nervous system: Alert and oriented. No focal neurological deficits. Extremities: Symmetric 5 x 5 power. Skin: No rashes, lesions or ulcers Psychiatry: Judgement and insight appear normal. Mood & affect appropriate.    Condition at discharge: good  The results of significant diagnostics from this hospitalization (including imaging, microbiology, ancillary and  laboratory) are listed below for reference.   Imaging Studies: PERIPHERAL VASCULAR CATHETERIZATION  Result Date: 10/03/2022 See surgical note for result.  CT Angio Chest Pulmonary Embolism (PE) W or WO Contrast  Addendum Date: 10/03/2022   ADDENDUM REPORT: 10/03/2022 14:12 ADDENDUM: Critical Value/emergent results were called by telephone at the time of interpretation on 10/03/2022 at 2:12 pm to provider [facility] , who verbally acknowledged these results. Electronically Signed   By: Jules Schick M.D.   On: 10/03/2022 14:12   Result Date: 10/03/2022 CLINICAL DATA:  Pulmonary embolism (PE) suspected, high prob. Chest pain. Radiating to right neck. EXAM: CT ANGIOGRAPHY CHEST WITH CONTRAST TECHNIQUE: Multidetector CT imaging of the chest was performed using the standard protocol during bolus administration of intravenous contrast. Multiplanar CT image reconstructions and MIPs were obtained to evaluate the vascular anatomy. RADIATION DOSE REDUCTION: This exam was performed according to the departmental dose-optimization program which includes automated exposure control, adjustment of the mA and/or kV according to patient size and/or use of iterative reconstruction technique. CONTRAST:  75mL OMNIPAQUE IOHEXOL 350 MG/ML SOLN COMPARISON:  Coronary CT scan from 05/22/2019. FINDINGS: Cardiovascular: There are multiple occlusive and nonocclusive filling defects in the lobar, segmental and subsegmental pulmonary artery branches to bilateral  lower lobes, inferior lingula and right upper lobe, compatible with at least moderate volume acute pulmonary embolism. There is mild flattening of the interventricular septum, suggesting right heart strain. No lung infarction. Normal cardiac size. No pericardial effusion. No aortic aneurysm. Mediastinum/Nodes: Visualized thyroid gland appears grossly unremarkable. No solid / cystic mediastinal masses. The esophagus is nondistended precluding optimal assessment. No axillary,  mediastinal or hilar lymphadenopathy by size criteria. Lungs/Pleura: The central tracheo-bronchial tree is patent. There are dependent changes in bilateral lungs. There are patchy areas of linear, plate-like atelectasis and/or scarring throughout bilateral lungs. No mass or consolidation. No pleural effusion or pneumothorax. No suspicious lung nodules. Upper Abdomen: Visualized upper abdominal viscera within normal limits. Musculoskeletal: The visualized soft tissues of the chest wall are grossly unremarkable. No suspicious osseous lesions. There are mild multilevel degenerative changes in the visualized spine. Review of the MIP images confirms the above findings. IMPRESSION: *Moderate volume acute pulmonary embolism with evidence of right heart strain. No lung infarction. *Multiple other nonacute observations, as described above. Electronically Signed: By: Jules Schick M.D. On: 10/03/2022 14:05   ECHOCARDIOGRAM COMPLETE  Result Date: 10/03/2022    ECHOCARDIOGRAM REPORT   Patient Name:   MYNIAH HADDAD Date of Exam: 10/03/2022 Medical Rec #:  [id number]        Height:       67.0 in Accession #:    [id number]       Weight:       252.0 lb Date of Birth:  [birth year]         BSA:          2.231 m Patient Age:    62 years         BP:           113/80 mmHg Patient Gender: F                HR:           88 bpm. Exam Location:  [facility] Procedure: 2D Echo, 3D Echo, Cardiac Doppler, Color Doppler, Strain Analysis and            Intracardiac Opacification Agent Indications:     Chest Pain, Elevated troponin  History:         Patient has prior history of Echocardiogram examinations, most                  recent 03/21/2020. CHF and Cardiomyopathy, Acute MI,                  Defibrillator, Signs/Symptoms:Chest Pain and                  Dizziness/Lightheadedness; Risk Factors:Hypertension, Sleep                  Apnea, Dyslipidemia and Non-Smoker.  Sonographer:     Mikki Harbor Referring Phys:  [id number] AMY N COX Diagnosing  Phys: Alwyn Pea MD  Sonographer Comments: Patient is obese. IMPRESSIONS  1. Left ventricular ejection fraction, by estimation, is 30 to 35%. The left ventricle has moderately decreased function. The left ventricle demonstrates global hypokinesis. The left ventricular internal cavity size was moderately to severely dilated. There is moderate concentric left ventricular hypertrophy. Left ventricular diastolic parameters are consistent with Grade I diastolic dysfunction (impaired relaxation).  2. Right ventricular systolic function is mildly reduced. The right ventricular size is mildly enlarged. Mildly increased right ventricular wall thickness. There is normal pulmonary artery systolic pressure.  3. Left  atrial size was mildly dilated.  4. Right atrial size was mildly dilated.  5. The mitral valve is abnormal. No evidence of mitral valve regurgitation.  6. The aortic valve is normal in structure. Aortic valve regurgitation is not visualized. FINDINGS  Left Ventricle: Left ventricular ejection fraction, by estimation, is 30 to 35%. The left ventricle has moderately decreased function. The left ventricle demonstrates global hypokinesis. Definity contrast agent was given IV to delineate the left ventricular endocardial borders. The left ventricular internal cavity size was moderately to severely dilated. There is moderate concentric left ventricular hypertrophy. Abnormal (paradoxical) septal motion, consistent with RV pacemaker. Left ventricular  diastolic parameters are consistent with Grade I diastolic dysfunction (impaired relaxation). Right Ventricle: The right ventricular size is mildly enlarged. Mildly increased right ventricular wall thickness. Right ventricular systolic function is mildly reduced. There is normal pulmonary artery systolic pressure. The tricuspid regurgitant velocity is 1.98 m/s, and with an assumed right atrial pressure of 8 mmHg, the estimated right ventricular systolic pressure is 23.7  mmHg. Left Atrium: Left atrial size was mildly dilated. Right Atrium: Right atrial size was mildly dilated. Pericardium: There is no evidence of pericardial effusion. Mitral Valve: The mitral valve is abnormal. No evidence of mitral valve regurgitation. MV peak gradient, 2.2 mmHg. The mean mitral valve gradient is 1.0 mmHg. Tricuspid Valve: The tricuspid valve is normal in structure. Tricuspid valve regurgitation is mild. Aortic Valve: The aortic valve is normal in structure. Aortic valve regurgitation is not visualized. Aortic valve mean gradient measures 1.0 mmHg. Aortic valve peak gradient measures 2.5 mmHg. Aortic valve area, by VTI measures 2.82 cm. Pulmonic Valve: The pulmonic valve was normal in structure. Pulmonic valve regurgitation is not visualized. Aorta: The ascending aorta was not well visualized. IAS/Shunts: No atrial level shunt detected by color flow Doppler. Additional Comments: A device lead is visualized.  LEFT VENTRICLE PLAX 2D LVIDd:         5.10 cm     Diastology LVIDs:         3.90 cm     LV e' medial:    6.09 cm/s LV PW:         1.50 cm     LV E/e' medial:  6.3 LV IVS:        1.40 cm     LV e' lateral:   7.29 cm/s LVOT diam:     2.20 cm     LV E/e' lateral: 5.2 LV SV:         43 LV SV Index:   19 LVOT Area:     3.80 cm  LV Volumes (MOD) LV vol d, MOD A2C: 59.2 ml LV vol d, MOD A4C: 89.6 ml LV vol s, MOD A2C: 42.9 ml LV vol s, MOD A4C: 54.4 ml LV SV MOD A2C:     16.3 ml LV SV MOD A4C:     89.6 ml LV SV MOD BP:      26.6 ml RIGHT VENTRICLE RV Basal diam:  3.85 cm RV Mid diam:    3.50 cm RV S prime:     8.05 cm/s TAPSE (M-mode): 2.0 cm LEFT ATRIUM             Index        RIGHT ATRIUM           Index LA diam:        4.20 cm 1.88 cm/m   RA Area:     19.90 cm LA Vol (A2C):  36.5 ml 16.36 ml/m  RA Volume:   57.40 ml  25.73 ml/m LA Vol (A4C):   33.1 ml 14.84 ml/m LA Biplane Vol: 35.6 ml 15.96 ml/m  AORTIC VALVE                    PULMONIC VALVE AV Area (Vmax):    3.28 cm     PV Vmax:        0.53 m/s AV Area (Vmean):   2.81 cm     PV Peak grad:  1.1 mmHg AV Area (VTI):     2.82 cm AV Vmax:           79.70 cm/s AV Vmean:          52.000 cm/s AV VTI:            0.151 m AV Peak Grad:      2.5 mmHg AV Mean Grad:      1.0 mmHg LVOT Vmax:         68.80 cm/s LVOT Vmean:        38.500 cm/s LVOT VTI:          0.112 m LVOT/AV VTI ratio: 0.74  AORTA Ao Root diam: 3.70 cm MITRAL VALVE               TRICUSPID VALVE MV Area (PHT): 6.12 cm    TR Peak grad:   15.7 mmHg MV Area VTI:   3.11 cm    TR Vmax:        198.00 cm/s MV Peak grad:  2.2 mmHg MV Mean grad:  1.0 mmHg    SHUNTS MV Vmax:       0.74 m/s    Systemic VTI:  0.11 m MV Vmean:      43.1 cm/s   Systemic Diam: 2.20 cm MV Decel Time: 124 msec MV E velocity: 38.10 cm/s MV A velocity: 64.80 cm/s MV E/A ratio:  0.59 Dwayne D Callwood MD Electronically signed by Alwyn Pea MD Signature Date/Time: 10/03/2022/1:24:25 PM    Final    US Venous Img Lower Bilateral (DVT)  Result Date: 10/03/2022 CLINICAL DATA:  Lower extremity edema, chest pain EXAM: BILATERAL LOWER EXTREMITY VENOUS DOPPLER ULTRASOUND TECHNIQUE: Gray-scale sonography with graded compression, as well as color Doppler and duplex ultrasound were performed to evaluate the lower extremity deep venous systems from the level of the common femoral vein and including the common femoral, femoral, profunda femoral, popliteal and calf veins including the posterior tibial, peroneal and gastrocnemius veins when visible. The superficial great saphenous vein was also interrogated. Spectral Doppler was utilized to evaluate flow at rest and with distal augmentation maneuvers in the common femoral, femoral and popliteal veins. COMPARISON:  None Available. FINDINGS: RIGHT LOWER EXTREMITY Common Femoral Vein: No evidence of thrombus. Normal compressibility, respiratory phasicity and response to augmentation. Saphenofemoral Junction: No evidence of thrombus. Normal compressibility and flow on color Doppler  imaging. Profunda Femoral Vein: No evidence of thrombus. Normal compressibility and flow on color Doppler imaging. Femoral Vein: No evidence of thrombus. Normal compressibility, respiratory phasicity and response to augmentation. Popliteal Vein: No evidence of thrombus. Normal compressibility, respiratory phasicity and response to augmentation. Calf Veins: No evidence of thrombus. Normal compressibility and flow on color Doppler imaging. Superficial Great Saphenous Vein: No evidence of thrombus. Normal compressibility. Venous Reflux:  None. Other Findings:  None. LEFT LOWER EXTREMITY Common Femoral Vein: No evidence of thrombus. Normal compressibility, respiratory phasicity and response to augmentation. Saphenofemoral Junction: Thrombus  is present within the great saphenous vein extending almost to the saphenofemoral junction. Profunda Femoral Vein: No evidence of thrombus. Normal compressibility and flow on color Doppler imaging. Femoral Vein: No evidence of thrombus. Normal compressibility, respiratory phasicity and response to augmentation. Popliteal Vein: No evidence of thrombus. Normal compressibility, respiratory phasicity and response to augmentation. Calf Veins: No evidence of thrombus. Normal compressibility and flow on color Doppler imaging. Superficial Great Saphenous Vein: Thrombus in the upper thigh. Venous Reflux:  None. Other Findings:  None. IMPRESSION: 1. Superficial thrombosis of the great saphenous vein in the upper thigh extending almost to the saphenofemoral junction but not into the common femoral vein. 2. No evidence of deep venous thrombosis in the right lower extremity. Electronically Signed   By: Malachy Moan M.D.   On: 10/03/2022 10:17   DG Chest 2 View  Result Date: 10/02/2022 CLINICAL DATA:  cp EXAM: CHEST - 2 VIEW COMPARISON:  Chest x-ray 11/07/2020 FINDINGS: Left chest wall single lead cardiac defibrillator. The heart and mediastinal contours are unchanged. No focal  consolidation. No pulmonary edema. No pleural effusion. No pneumothorax. No acute osseous abnormality. IMPRESSION: No active cardiopulmonary disease. Electronically Signed   By: Tish Frederickson M.D.   On: 10/02/2022 19:52    Microbiology: Results for orders placed or performed during the hospital encounter of 12/09/14  Urine culture     Status: None   Collection Time: 12/09/14  2:05 PM   Specimen: Urine, Random  Result Value Ref Range Status   Specimen Description URINE, RANDOM  Final   Special Requests Normal  Final   Culture INSIGNIFICANT GROWTH  Final   Report Status 12/11/2014 FINAL  Final    Labs: CBC: Recent Labs  Lab 10/02/22 1748 10/03/22 0629 10/04/22 0422  WBC 5.6 5.9 7.3  HGB 14.9 14.2 12.8  HCT 45.8 42.7 39.6  MCV 93.7 92.8 94.1  PLT 139* 127* 115*   Basic Metabolic Panel: Recent Labs  Lab 10/02/22 1748 10/03/22 0629 10/04/22 0755  NA 142 139 133*  K 3.6 3.4* 3.8  CL 105 107 102  CO2 28 25 22   GLUCOSE 110* 106* 103*  BUN 11 13 13   CREATININE 0.99 0.75 0.85  CALCIUM 9.6 8.7* 8.8*   Liver Function Tests: No results for input(s): "AST", "ALT", "ALKPHOS", "BILITOT", "PROT", "ALBUMIN" in the last 168 hours. CBG: No results for input(s): "GLUCAP" in the last 168 hours.  Discharge time spent: greater than 30 minutes.  Signed: Marrion Coy, MD [facility] 10/04/2022

## 2022-10-04 NOTE — TOC Benefit Eligibility Note (Signed)
Pharmacy Patient Advocate Encounter  Insurance verification completed.    The patient is insured through [employer]. Patient has Medicare and is not eligible for a copay card, but may be able to apply for patient assistance, if available.    Ran test claim for Eliquis 2.5 & 5 and the current 30 day co-pay is $139.76.   This test claim was processed through [facility]- copay amounts may vary at other pharmacies due to pharmacy/plan contracts, or as the patient moves through the different stages of their insurance plan.

## 2022-10-04 NOTE — Progress Notes (Signed)
Transition of Care [facility]) - Inpatient Brief Assessment   Patient Details  Name: Lisa Fuentes MRN: [id number] Date of Birth: 01-31-1960  Transition [facility] Care [facility]) CM/SW Contact:    Truddie Hidden, RN Phone Number: 10/04/2022, 10:41 AM   Clinical Narrative: Transition of Care Department [facility]) has reviewed patient and no TOC needs have been identified at this time. We will continue to monitor patient advancement. If new patient transition needs arise, please place a TOC consult.     Transition of Care Asessment: Insurance and Status: Insurance coverage has been reviewed Patient has primary care physician: Yes Home environment has been reviewed: Return to home Prior level of function:: assisted by family Prior/Current Home Services: No current home services Social Determinants of Health Reivew: SDOH reviewed no interventions necessary Readmission risk has been reviewed: Yes Transition of care needs: no transition of care needs at this time

## 2022-10-04 NOTE — Progress Notes (Signed)
ANTICOAGULATION CONSULT NOTE  Pharmacy Consult for : Eliquis Indication: acute PE s/p thrombectomy  Allergies  Allergen Reactions   Sulfa Antibiotics Hives   Sulfamethoxazole-Trimethoprim Hives    Patient Measurements: Height: 5\' 7"  (170.2 cm) Weight: 114.3 kg (252 lb) IBW/kg (Calculated) : 61.6  Vital Signs: Temp: 98.5 F (36.9 C) (09/05 0803) Temp Source: Oral (09/05 0803) BP: 103/73 (09/05 0803) Pulse Rate: 83 (09/05 0803)  Labs: Recent Labs    10/02/22 1748 10/02/22 1748 10/02/22 2146 10/03/22 0019 10/03/22 0629 10/03/22 0630 10/03/22 2306 10/04/22 0422  HGB 14.9  --   --   --  14.2  --   --  12.8  HCT 45.8  --   --   --  42.7  --   --  39.6  PLT 139*  --   --   --  127*  --   --  115*  APTT  --   --   --  151*  --  150*  --   --   LABPROT  --   --   --  14.9  --   --   --   --   INR  --   --   --  1.1  --   --   --   --   HEPARINUNFRC  --    < >  --  0.71*  --  0.66 0.52 0.69  CREATININE 0.99  --   --   --  0.75  --   --   --   TROPONINIHS 52*  --  126*  --   --   --   --   --    < > = values in this interval not displayed.    Estimated Creatinine Clearance: 97.6 mL/min (by C-G formula based on SCr of 0.75 mg/dL).   Medical History: Past Medical History:  Diagnosis Date   AICD (automatic cardioverter/defibrillator) present 2008   a.) Medtronic device placed 2008. b.) replaced 06/21/2013 (Medtronic Evera XT single-chamber ICD; model number [id number]).   Anemia    Anxiety    Aortic dilatation (HCC) 03/24/2019   a.) TTE 03/24/2019: Ao root measured 37 mm. b.) TTE 03/21/2020: Ao root measured 37 mm.   Arthritis    Depression    Difficult intubation    DOE (dyspnea on exertion)    Endometriosis of vagina 09/2013   Intra-operative findings of endometriosis implants on cervical stump   Essential hypertension    GERD (gastroesophageal reflux disease)    Headache    HFrEF (heart failure with reduced ejection fraction) (HCC)    a.) TTE 02/18/2014: EF  40-45%; tiv AR, mild MR/TR/PR; G1DD. b.) TTE 02/22/2017: EF 45-50%; mild LVH; triv PR; G1DD. c.) TTE 03/24/2019: EF 45-50%; glob HK, mild LVH; triv MR/TR/PR. d.) TTE 03/21/2020: EF 40-45%; glob HK.   History of 2019 novel coronavirus disease (COVID-19) 02/02/2020   History of 2019 novel coronavirus disease (COVID-19) 02/02/2020   Hypokalemia    Hypothyroidism    a.) s/p radioactive iodine Tx; on levothyroxine   Mixed hyperlipidemia    Nonischemic cardiomyopathy (HCC)    a.) TTE 02/18/2014: EF 40-45%. b.) TTE 02/22/2017: EF 45-50%. c.) TTE 03/24/2019: EF 45-50%. d.) TTE 03/21/2020: EF 40-45%   OSA on CPAP    Panic attacks    PSVT (paroxysmal supraventricular tachycardia) 03/30/2020   a.) Holter 03/30/2020 --> 2 runs; fastest lasting 4 beats (184 bpm); longest lasting 4 beats (113 bpm)   Tachycardia  Assessment: Pt is a 62 yo female with hx of multiple DVTs of the right upper extremity. CTA chest showed left lower lobe segmental and subsegmental pulmonary emboli without evidence of right heart strain. The patient was started on Eliquis. She completed several months of treatment of Eliquis, and it was recently discontinued per hematology as reported on 09/18/22. Pt presenting to ED c/o CP and SOB, found with elevated Troponin I level, trending up.  Plan: D/c heparin and heparin consult Start Eliquis for PE treatment ; 10mg  po BID x 1 week, then 5mg  BID  Aydin Cavalieri Rodriguez-Guzman PharmD, BCPS 10/04/2022 8:20 AM

## 2022-10-04 NOTE — Progress Notes (Signed)
ANTICOAGULATION CONSULT NOTE  Pharmacy Consult for heparin infusion Indication: ACS/STEMI  Allergies  Allergen Reactions   Sulfa Antibiotics Hives   Sulfamethoxazole-Trimethoprim Hives    Patient Measurements: Height: 5\' 7"  (170.2 cm) Weight: 114.3 kg (252 lb) IBW/kg (Calculated) : 61.6 Heparin Dosing Weight: 88.2 kg  Vital Signs: Temp: 98 F (36.7 C) (09/04 2126) Temp Source: Oral (09/04 1805) BP: 102/71 (09/04 2126) Pulse Rate: 89 (09/04 2126)  Labs: Recent Labs    10/02/22 1748 10/02/22 2146 10/03/22 0019 10/03/22 0629 10/03/22 0630 10/03/22 2306  HGB 14.9  --   --  14.2  --   --   HCT 45.8  --   --  42.7  --   --   PLT 139*  --   --  127*  --   --   APTT  --   --  151*  --  150*  --   LABPROT  --   --  14.9  --   --   --   INR  --   --  1.1  --   --   --   HEPARINUNFRC  --   --  0.71*  --  0.66 0.52  CREATININE 0.99  --   --  0.75  --   --   TROPONINIHS 52* 126*  --   --   --   --     Estimated Creatinine Clearance: 97.6 mL/min (by C-G formula based on SCr of 0.75 mg/dL).   Medical History: Past Medical History:  Diagnosis Date   AICD (automatic cardioverter/defibrillator) present 2008   a.) Medtronic device placed 2008. b.) replaced 06/21/2013 (Medtronic Evera XT single-chamber ICD; model number [id number]).   Anemia    Anxiety    Aortic dilatation (HCC) 03/24/2019   a.) TTE 03/24/2019: Ao root measured 37 mm. b.) TTE 03/21/2020: Ao root measured 37 mm.   Arthritis    Depression    Difficult intubation    DOE (dyspnea on exertion)    Endometriosis of vagina 09/2013   Intra-operative findings of endometriosis implants on cervical stump   Essential hypertension    GERD (gastroesophageal reflux disease)    Headache    HFrEF (heart failure with reduced ejection fraction) (HCC)    a.) TTE 02/18/2014: EF 40-45%; tiv AR, mild MR/TR/PR; G1DD. b.) TTE 02/22/2017: EF 45-50%; mild LVH; triv PR; G1DD. c.) TTE 03/24/2019: EF 45-50%; glob HK, mild LVH; triv  MR/TR/PR. d.) TTE 03/21/2020: EF 40-45%; glob HK.   History of 2019 novel coronavirus disease (COVID-19) 02/02/2020   History of 2019 novel coronavirus disease (COVID-19) 02/02/2020   Hypokalemia    Hypothyroidism    a.) s/p radioactive iodine Tx; on levothyroxine   Mixed hyperlipidemia    Nonischemic cardiomyopathy (HCC)    a.) TTE 02/18/2014: EF 40-45%. b.) TTE 02/22/2017: EF 45-50%. c.) TTE 03/24/2019: EF 45-50%. d.) TTE 03/21/2020: EF 40-45%   OSA on CPAP    Panic attacks    PSVT (paroxysmal supraventricular tachycardia) 03/30/2020   a.) Holter 03/30/2020 --> 2 runs; fastest lasting 4 beats (184 bpm); longest lasting 4 beats (113 bpm)   Tachycardia     Assessment: Pt is a 62 yo female with hx of multiple DVTs of the right upper extremity. CTA chest showed left lower lobe segmental and subsegmental pulmonary emboli without evidence of right heart strain. The patient was started on Eliquis. She completed several months of treatment of Eliquis, and it was recently discontinued per hematology as reported on 09/18/22.  Pt presenting to ED c/o CP and SOB, found with elevated Troponin I level, trending up.  Baseline labs: Hgb 14.9  Plt 139 INR 1.1   aPTT 151  HL 0.71   (drawn @ 0019- after heparin drip was already hung @ 2327)   0904 0630  HL 0.66  therapeutic x 1 0904 2306 HL 0.52, therapeutic x 2    Goal of Therapy:  Heparin level 0.3-0.7 units/ml Monitor platelets by anticoagulation protocol: Yes   Plan: Will continue heparin infusion at 1200 units/hr Recheck HL daily w/ AM labs while therapeutic  CBC daily while on heparin  Otelia Sergeant, PharmD, [facility] 10/04/2022 12:29 AM

## 2022-10-04 NOTE — Plan of Care (Signed)

## 2022-10-04 NOTE — Progress Notes (Signed)
Progress Note    10/04/2022 11:35 AM 1 Day Post-Op  Subjective: Lisa Fuentes is a 62 year old female now status postop day 1 from a pulmonary thrombectomy.  Patient is resting comfortably in bed this morning.  Patient endorses she feels much better and is breathing better.  Patient's right groin continues to be sore but manageable.  Patient request to go home today.  No complaints overnight and vitals all remained stable.   Vitals:   10/04/22 0102 10/04/22 0803  BP: 98/66 103/73  Pulse: 88 83  Resp: 18   Temp: 98.7 F (37.1 C) 98.5 F (36.9 C)  SpO2: 91% 96%   Physical Exam: Cardiac:  RRR, normal S1, S2.  No murmurs present. Lungs: Clear on auscultation throughout but diminished in the bases.  No rales rhonchi or wheezing noted. Incisions: Right groin incision clean dry and intact with dressing.  No hematoma seroma noted. Extremities: All extremities with palpable pulses.  Bilateral lower extremities warm to touch. Abdomen: Positive bowel sounds throughout, soft, nontender and nondistended. Neurologic: Alert and oriented x 4, follows all commands and answers all questions appropriately.  CBC    Component Value Date/Time   WBC 7.3 10/04/2022 0422   RBC 4.21 10/04/2022 0422   HGB 12.8 10/04/2022 0422   HGB 13.0 10/06/2013 1006   HCT 39.6 10/04/2022 0422   HCT 41.0 06/01/2013 1003   PLT 115 (L) 10/04/2022 0422   PLT 165 06/01/2013 1003   MCV 94.1 10/04/2022 0422   MCV 90 06/01/2013 1003   MCH 30.4 10/04/2022 0422   MCHC 32.3 10/04/2022 0422   RDW 14.0 10/04/2022 0422   RDW 13.0 06/01/2013 1003   LYMPHSABS 1.6 03/01/2015 1212   LYMPHSABS 2.0 06/01/2013 1003   MONOABS 0.3 03/01/2015 1212   MONOABS 0.4 06/01/2013 1003   EOSABS 0.1 03/01/2015 1212   EOSABS 0.0 06/01/2013 1003   BASOSABS 0.0 03/01/2015 1212   BASOSABS 0.0 06/01/2013 1003    BMET    Component Value Date/Time   NA 133 (L) 10/04/2022 0755   NA 143 05/15/2021 1513   NA 137 06/01/2013 1003   K 3.8  10/04/2022 0755   K 3.6 06/01/2013 1003   CL 102 10/04/2022 0755   CL 104 06/01/2013 1003   CO2 22 10/04/2022 0755   CO2 29 06/01/2013 1003   GLUCOSE 103 (H) 10/04/2022 0755   GLUCOSE 107 (H) 06/01/2013 1003   BUN 13 10/04/2022 0755   BUN 17 05/15/2021 1513   BUN 11 06/01/2013 1003   CREATININE 0.85 10/04/2022 0755   CREATININE 0.69 06/01/2013 1003   CALCIUM 8.8 (L) 10/04/2022 0755   CALCIUM 9.2 06/01/2013 1003   GFRNONAA >60 10/04/2022 0755   GFRNONAA >60 06/01/2013 1003   GFRAA >60 04/10/2019 0238   GFRAA >60 06/01/2013 1003    INR    Component Value Date/Time   INR 1.1 10/03/2022 0019   INR 1.1 06/01/2013 1003     Intake/Output Summary (Last 24 hours) at 10/04/2022 1135 Last data filed at 10/03/2022 1659 Gross per 24 hour  Intake --  Output 400 ml  Net -400 ml     Assessment/Plan:  62 y.o. female is s/p pulmonary thrombectomy.  1 Day Post-Op   PLAN: Vascular surgery is in agreement with medicine if medically appropriate to discharge today.  Patient will need to be converted from IV heparin infusion to oral anticoagulation.  Patient has had clotting difficulties in the past and therefore vascular surgery recommends patient be on anticoagulation preferably  Eliquis 10 mg twice daily x 7 days then 5 mg twice daily indefinitely.  Patient to follow-up with vein and vascular in 3 to 4 weeks as scheduled.  DVT prophylaxis: Heparin infusion to be converted to oral Eliquis today.   Marcie [facility] 10/04/2022 11:35 AM

## 2022-10-04 NOTE — Progress Notes (Signed)
[facility] CARDIOLOGY CONSULT NOTE       Patient ID: Lisa Fuentes MRN: [id number] DOB/AGE: 02/08/1960 62 y.o.  Admit date: 10/02/2022 Referring Physician Dr. Londell Moh Primary Physician Dr. Jerl Mina Primary Cardiologist Dr. Darrold Junker Reason for Consultation chest pain  HPI: Lisa Fuentes is a 62 y.o. female  with a past medical history of nonischemic dilated cardiomyopathy, HFrEF with LVEF 40 to 45%, history of ventricular tachycardia, status post ICD, hypertension, hyperlipidemia, obesity, OSA who presented to the ED on 10/02/2022 for chest pain. Cardiology was consulted for further evaluation.   Interval history:  -Patient resting comfortably in bed at the time of my evaluation, reports feeling much better.  -CP and SOB improved after thrombectomy yesterday.  -Home GDMT for heart failure has been restarted and BP & renal function remain stable.   Review of systems complete and found to be negative unless listed above    Past Medical History:  Diagnosis Date   AICD (automatic cardioverter/defibrillator) present 2008   a.) Medtronic device placed 2008. b.) replaced 06/21/2013 (Medtronic Evera XT single-chamber ICD; model number VRDVBB1D1).   Anemia    Anxiety    Aortic dilatation (HCC) 03/24/2019   a.) TTE 03/24/2019: Ao root measured 37 mm. b.) TTE 03/21/2020: Ao root measured 37 mm.   Arthritis    Depression    Difficult intubation    DOE (dyspnea on exertion)    Endometriosis of vagina 09/2013   Intra-operative findings of endometriosis implants on cervical stump   Essential hypertension    GERD (gastroesophageal reflux disease)    Headache    HFrEF (heart failure with reduced ejection fraction) (HCC)    a.) TTE 02/18/2014: EF 40-45%; tiv AR, mild MR/TR/PR; G1DD. b.) TTE 02/22/2017: EF 45-50%; mild LVH; triv PR; G1DD. c.) TTE 03/24/2019: EF 45-50%; glob HK, mild LVH; triv MR/TR/PR. d.) TTE 03/21/2020: EF 40-45%; glob HK.   History of 2019 novel coronavirus  disease (COVID-19) 02/02/2020   History of 2019 novel coronavirus disease (COVID-19) 02/02/2020   Hypokalemia    Hypothyroidism    a.) s/p radioactive iodine Tx; on levothyroxine   Mixed hyperlipidemia    Nonischemic cardiomyopathy (HCC)    a.) TTE 02/18/2014: EF 40-45%. b.) TTE 02/22/2017: EF 45-50%. c.) TTE 03/24/2019: EF 45-50%. d.) TTE 03/21/2020: EF 40-45%   OSA on CPAP    Panic attacks    PSVT (paroxysmal supraventricular tachycardia) 03/30/2020   a.) Holter 03/30/2020 --> 2 runs; fastest lasting 4 beats (184 bpm); longest lasting 4 beats (113 bpm)   Tachycardia     Past Surgical History:  Procedure Laterality Date   ANTERIOR AND POSTERIOR REPAIR N/A 11/25/2017   Procedure: ANTERIOR (CYSTOCELE) AND POSTERIOR REPAIR (RECTOCELE);  Surgeon: Hildred Laser, MD;  Location: [facility] ORS;  Service: Gynecology;  Laterality: N/A;   BREAST CYST ASPIRATION Left 03/22/2014   neg/ done by Dr Evette Cristal   CARDIAC CATHETERIZATION  2003   [facility]: No significant coronary artery disease with reduced ejection fraction.   CARDIAC DEFIBRILLATOR PLACEMENT  04/2006   replaced 05/2013   CARDIAC DEFIBRILLATOR PLACEMENT  06/21/2013   Procedure:  CARDIAC DEFIBRILLATOR PLACEMENT (Medtronic single-chamber ICD, model number Lianne Moris, model number [id number]): Location: [facility]; Surgeon: Gerre Pebbles, MD   CARPAL TUNNEL RELEASE Right    COLONOSCOPY  2015   COLONOSCOPY N/A 10/13/2020   Procedure: COLONOSCOPY;  Surgeon: Jaynie Collins, DO;  Location: [facility] ENDOSCOPY;  Service: Gastroenterology;  Laterality: N/A;   COLONOSCOPY WITH PROPOFOL N/A 10/04/2015  Procedure: COLONOSCOPY WITH PROPOFOL;  Surgeon: Midge Minium, MD;  Location: [facility] ENDOSCOPY;  Service: Endoscopy;  Laterality: N/A;   CORONARY ANGIOPLASTY     CYSTOSCOPY  11/15/2014   Procedure: CYSTOSCOPY;  Surgeon: Hildred Laser, MD;  Location: [facility] ORS;  Service: Gynecology;;   DIAGNOSTIC LAPAROSCOPY     ETHMOIDECTOMY Right 07/19/2021   Procedure: TOTAL  ETHMOIDECTOMY WITH FRONTAL SINUS EXPLORATION;  Surgeon: Vernie Murders, MD;  Location: [facility] ORS;  Service: ENT;  Laterality: Right;   FOOT SURGERY Bilateral    heer spur and bunion   IMAGE GUIDED SINUS SURGERY N/A 07/19/2021   Procedure: IMAGE GUIDED SINUS SURGERY;  Surgeon: Vernie Murders, MD;  Location: [facility] ORS;  Service: ENT;  Laterality: N/A;   KNEE ARTHROSCOPY Left 03/02/2015   Procedure: ARTHROSCOPY  LEFT KNEE, PARTIAL LATERAL  MENISECTOMY, SYNOVECTOMY, MEDIAL & LATERAL CHONDROPLASTY;  Surgeon: Juanell Fairly, MD;  Location: [facility] OR[facility];  Service: Orthopedics;  Laterality: Left;   LAPAROSCOPIC SALPINGO OOPHERECTOMY Left 11/15/2014   Procedure: LAPAROSCOPIC OOPHORECTOMY;  Surgeon: Hildred Laser, MD;  Location: [facility];  Service: Gynecology;  Laterality: Left;   LAPAROSCOPIC SALPINGOOPHERECTOMY Right 09/2013   also with left salpingectomy   LEFT HEART CATH AND CORONARY ANGIOGRAPHY N/A 03/28/2020   Procedure: LEFT HEART CATH AND CORONARY ANGIOGRAPHY;  Surgeon: Iran Ouch, MD;  Location: [facility] INVASIVE CV LAB;  Service: Cardiovascular;  Laterality: N/A;   MAXILLARY ANTROSTOMY Right 07/19/2021   Procedure: MAXILLARY ANTROSTOMY WITH REMOVAL TISSUE;  Surgeon: Vernie Murders, MD;  Location: [facility];  Service: ENT;  Laterality: Right;   MINOR HEMORRHOIDECTOMY     NASAL TURBINATE REDUCTION Bilateral 07/19/2021   Procedure: INFERIOR TURBINATE REDUCTION;  Surgeon: Vernie Murders, MD;  Location: [facility];  Service: ENT;  Laterality: Bilateral;   PULMONARY THROMBECTOMY Bilateral 10/03/2022   Procedure: PULMONARY THROMBECTOMY;  Surgeon: Annice Needy, MD;  Location: [facility] INVASIVE CV LAB;  Service: Cardiovascular;  Laterality: Bilateral;   SEPTOPLASTY N/A 07/19/2021   Procedure: SEPTOPLASTY;  Surgeon: Vernie Murders, MD;  Location: [facility];  Service: ENT;  Laterality: N/A;   TONSILLECTOMY     TOTAL ABDOMINAL HYSTERECTOMY     TRACHELECTOMY N/A 11/15/2014   Procedure: TRACHELECTOMY;  Surgeon: Hildred Laser,  MD;  Location: [facility] OR[facility];  Service: Gynecology;  Laterality: N/A;   TRIGGER FINGER RELEASE Right    TUBAL LIGATION Bilateral     Medications Prior to Admission  Medication Sig Dispense Refill Last Dose   Acetaminophen (TYLENOL ARTHRITIS PAIN PO) Take 1,300 mg by mouth every 6 (six) hours as needed (Pain).   prn at unknown   ENTRESTO 24-26 MG Take 1 tablet by mouth 2 (two) times daily.   10/02/2022   esomeprazole (NEXIUM) 40 MG capsule Take 40 mg by mouth every morning.    10/02/2022   fluocinonide-emollient (LIDEX-E) 0.05 % cream Apply 1 Application topically 2 (two) times daily. 60 g 0 10/02/2022   gabapentin (NEURONTIN) 100 MG capsule Take 100 mg by mouth 3 (three) times daily.   10/02/2022   JARDIANCE 10 MG TABS tablet TAKE 1 TABLET BY MOUTH ONCE DAILY BEFORE BREAKFAST 30 tablet 5 10/02/2022   levothyroxine (SYNTHROID) 125 MCG tablet Take 125 mcg by mouth every morning.   10/02/2022   metoprolol succinate (TOPROL-XL) 50 MG 24 hr tablet TAKE 1 TABLET BY MOUTH ONCE DAILY WITH  OR  IMMEDIATELY  FOLLOWING  A  MEAL 90 tablet 0 10/02/2022   oxyCODONE-acetaminophen (PERCOCET/ROXICET) 5-325 MG tablet Take 1 tablet by mouth every 4 (four) hours as  needed for severe pain.   10/02/2022   sertraline (ZOLOFT) 100 MG tablet Take 200 mg by mouth daily.   10/02/2022   spironolactone (ALDACTONE) 25 MG tablet Take 1/2 (one-half) tablet by mouth once daily 45 tablet 3 10/02/2022   meloxicam (MOBIC) 15 MG tablet Take 15 mg by mouth daily with breakfast. (Patient not taking: Reported on 10/03/2022)   Not Taking   Social History   Socioeconomic History   Marital status: Married    Spouse name: Jimmy   Number of children: 2   Years of education: Not on file   Highest education level: Not on file  Occupational History   Not on file  Tobacco Use   Smoking status: Never   Smokeless tobacco: Never  Vaping Use   Vaping status: Never Used  Substance and Sexual Activity   Alcohol use: Yes    Alcohol/week: 0.0 standard drinks of  alcohol    Comment: occassionally   Drug use: No   Sexual activity: Not Currently    Birth control/protection: Surgical  Other Topics Concern   Not on file  Social History Narrative   Not on file   Social Determinants of Health   Financial Resource Strain: Low Risk  (01/08/2022)   [facility], [facility]   Overall Financial Resource Strain (CARDIA)    Difficulty of Paying Living Expenses: Not hard at all  Food Insecurity: No Food Insecurity (10/03/2022)   Hunger Vital Sign    Worried About Running Out of Food in the Last Year: Never true    Ran Out of Food in the Last Year: Never true  Transportation Needs: No Transportation Needs (10/03/2022)   PRAPARE - Administrator, Civil Service (Medical): No    Lack of Transportation (Non-Medical): No  Physical Activity: Not on file  Stress: Not on file  Social Connections: Not on file  Intimate Partner Violence: Not At Risk (10/03/2022)   Humiliation, Afraid, Rape, and Kick questionnaire    Fear of Current or Ex-Partner: No    Emotionally Abused: No    Physically Abused: No    Sexually Abused: No    Family History  Problem Relation Age of Onset   Hypertension Mother    Hyperlipidemia Mother    Stroke Mother    Colon cancer Mother    Breast cancer Sister 24   Ovarian cancer Sister    Throat cancer Brother      Vitals:   10/03/22 2126 10/04/22 0102 10/04/22 0803 10/04/22 1206  BP: 102/71 98/66 103/73 92/62  Pulse: 89 88 83 97  Resp: 16 18  16   Temp: 98 F (36.7 C) 98.7 F (37.1 C) 98.5 F (36.9 C) 98.5 F (36.9 C)  TempSrc:  Oral Oral Oral  SpO2: 92% 91% 96% 94%  Weight:      Height:        PHYSICAL EXAM General: Well appearing female, well nourished, in no acute distress laying flat in hospital bed with husband present at bedside. HEENT: Normocephalic and atraumatic. Neck: No JVD.  Lungs: Normal respiratory effort on room air. Clear bilaterally to  auscultation. No wheezes, crackles, rhonchi.  Heart: HRRR. Normal S1 and S2 without gallops or murmurs.  Abdomen: Non-distended appearing.  Msk: Normal strength and tone for age. Extremities: Warm and well perfused. No clubbing, cyanosis. Trace edema.  Neuro: Alert and oriented X 3. Psych: Answers questions appropriately.   Labs: Basic Metabolic Panel: Recent Labs  10/03/22 0629 10/04/22 0755  NA 139 133*  K 3.4* 3.8  CL 107 102  CO2 25 22  GLUCOSE 106* 103*  BUN 13 13  CREATININE 0.75 0.85  CALCIUM 8.7* 8.8*   Liver Function Tests: No results for input(s): "AST", "ALT", "ALKPHOS", "BILITOT", "PROT", "ALBUMIN" in the last 72 hours. No results for input(s): "LIPASE", "AMYLASE" in the last 72 hours. CBC: Recent Labs    10/03/22 0629 10/04/22 0422  WBC 5.9 7.3  HGB 14.2 12.8  HCT 42.7 39.6  MCV 92.8 94.1  PLT 127* 115*   Cardiac Enzymes: Recent Labs    10/02/22 1748 10/02/22 2146  TROPONINIHS 52* 126*   BNP: No results for input(s): "BNP" in the last 72 hours. D-Dimer: Recent Labs    10/03/22 1840  DDIMER 16.05*   Hemoglobin A1C: No results for input(s): "HGBA1C" in the last 72 hours. Fasting Lipid Panel: No results for input(s): "CHOL", "HDL", "LDLCALC", "TRIG", "CHOLHDL", "LDLDIRECT" in the last 72 hours. Thyroid Function Tests: No results for input(s): "TSH", "T4TOTAL", "T3FREE", "THYROIDAB" in the last 72 hours.  Invalid input(s): "FREET3" Anemia Panel: No results for input(s): "VITAMINB12", "FOLATE", "FERRITIN", "TIBC", "IRON", "RETICCTPCT" in the last 72 hours.   Radiology: PERIPHERAL VASCULAR CATHETERIZATION  Result Date: 10/03/2022 See surgical note for result.  CT Angio Chest Pulmonary Embolism (PE) W or WO Contrast  Addendum Date: 10/03/2022   ADDENDUM REPORT: 10/03/2022 14:12 ADDENDUM: Critical Value/emergent results were called by telephone at the time of interpretation on 10/03/2022 at 2:12 pm to provider [facility] , who verbally  acknowledged these results. Electronically Signed   By: Jules Schick M.D.   On: 10/03/2022 14:12   Result Date: 10/03/2022 CLINICAL DATA:  Pulmonary embolism (PE) suspected, high prob. Chest pain. Radiating to right neck. EXAM: CT ANGIOGRAPHY CHEST WITH CONTRAST TECHNIQUE: Multidetector CT imaging of the chest was performed using the standard protocol during bolus administration of intravenous contrast. Multiplanar CT image reconstructions and MIPs were obtained to evaluate the vascular anatomy. RADIATION DOSE REDUCTION: This exam was performed according to the departmental dose-optimization program which includes automated exposure control, adjustment of the mA and/or kV according to patient size and/or use of iterative reconstruction technique. CONTRAST:  75mL OMNIPAQUE IOHEXOL 350 MG/ML SOLN COMPARISON:  Coronary CT scan from 05/22/2019. FINDINGS: Cardiovascular: There are multiple occlusive and nonocclusive filling defects in the lobar, segmental and subsegmental pulmonary artery branches to bilateral lower lobes, inferior lingula and right upper lobe, compatible with at least moderate volume acute pulmonary embolism. There is mild flattening of the interventricular septum, suggesting right heart strain. No lung infarction. Normal cardiac size. No pericardial effusion. No aortic aneurysm. Mediastinum/Nodes: Visualized thyroid gland appears grossly unremarkable. No solid / cystic mediastinal masses. The esophagus is nondistended precluding optimal assessment. No axillary, mediastinal or hilar lymphadenopathy by size criteria. Lungs/Pleura: The central tracheo-bronchial tree is patent. There are dependent changes in bilateral lungs. There are patchy areas of linear, plate-like atelectasis and/or scarring throughout bilateral lungs. No mass or consolidation. No pleural effusion or pneumothorax. No suspicious lung nodules. Upper Abdomen: Visualized upper abdominal viscera within normal limits. Musculoskeletal:  The visualized soft tissues of the chest wall are grossly unremarkable. No suspicious osseous lesions. There are mild multilevel degenerative changes in the visualized spine. Review of the MIP images confirms the above findings. IMPRESSION: *Moderate volume acute pulmonary embolism with evidence of right heart strain. No lung infarction. *Multiple other nonacute observations, as described above. Electronically Signed: By: Jules Schick M.D. On: 10/03/2022  14:05   ECHOCARDIOGRAM COMPLETE  Result Date: 10/03/2022    ECHOCARDIOGRAM REPORT   Patient Name:   Lisa Fuentes Date of Exam: 10/03/2022 Medical Rec #:  [id number]        Height:       67.0 in Accession #:    [id number]       Weight:       252.0 lb Date of Birth:  Jun 04, 1960         BSA:          2.231 m Patient Age:    62 years         BP:           113/80 mmHg Patient Gender: F                HR:           88 bpm. Exam Location:  [facility] Procedure: 2D Echo, 3D Echo, Cardiac Doppler, Color Doppler, Strain Analysis and            Intracardiac Opacification Agent Indications:     Chest Pain, Elevated troponin  History:         Patient has prior history of Echocardiogram examinations, most                  recent 03/21/2020. CHF and Cardiomyopathy, Acute MI,                  Defibrillator, Signs/Symptoms:Chest Pain and                  Dizziness/Lightheadedness; Risk Factors:Hypertension, Sleep                  Apnea, Dyslipidemia and Non-Smoker.  Sonographer:     Mikki Harbor Referring Phys:  [id number] AMY N COX Diagnosing Phys: Alwyn Pea MD  Sonographer Comments: Patient is obese. IMPRESSIONS  1. Left ventricular ejection fraction, by estimation, is 30 to 35%. The left ventricle has moderately decreased function. The left ventricle demonstrates global hypokinesis. The left ventricular internal cavity size was moderately to severely dilated. There is moderate concentric left ventricular hypertrophy. Left ventricular diastolic parameters are consistent  with Grade I diastolic dysfunction (impaired relaxation).  2. Right ventricular systolic function is mildly reduced. The right ventricular size is mildly enlarged. Mildly increased right ventricular wall thickness. There is normal pulmonary artery systolic pressure.  3. Left atrial size was mildly dilated.  4. Right atrial size was mildly dilated.  5. The mitral valve is abnormal. No evidence of mitral valve regurgitation.  6. The aortic valve is normal in structure. Aortic valve regurgitation is not visualized. FINDINGS  Left Ventricle: Left ventricular ejection fraction, by estimation, is 30 to 35%. The left ventricle has moderately decreased function. The left ventricle demonstrates global hypokinesis. Definity contrast agent was given IV to delineate the left ventricular endocardial borders. The left ventricular internal cavity size was moderately to severely dilated. There is moderate concentric left ventricular hypertrophy. Abnormal (paradoxical) septal motion, consistent with RV pacemaker. Left ventricular  diastolic parameters are consistent with Grade I diastolic dysfunction (impaired relaxation). Right Ventricle: The right ventricular size is mildly enlarged. Mildly increased right ventricular wall thickness. Right ventricular systolic function is mildly reduced. There is normal pulmonary artery systolic pressure. The tricuspid regurgitant velocity is 1.98 m/s, and with an assumed right atrial pressure of 8 mmHg, the estimated right ventricular systolic pressure is 23.7 mmHg. Left Atrium: Left atrial size was mildly dilated. Right Atrium:  Right atrial size was mildly dilated. Pericardium: There is no evidence of pericardial effusion. Mitral Valve: The mitral valve is abnormal. No evidence of mitral valve regurgitation. MV peak gradient, 2.2 mmHg. The mean mitral valve gradient is 1.0 mmHg. Tricuspid Valve: The tricuspid valve is normal in structure. Tricuspid valve regurgitation is mild. Aortic Valve: The  aortic valve is normal in structure. Aortic valve regurgitation is not visualized. Aortic valve mean gradient measures 1.0 mmHg. Aortic valve peak gradient measures 2.5 mmHg. Aortic valve area, by VTI measures 2.82 cm. Pulmonic Valve: The pulmonic valve was normal in structure. Pulmonic valve regurgitation is not visualized. Aorta: The ascending aorta was not well visualized. IAS/Shunts: No atrial level shunt detected by color flow Doppler. Additional Comments: A device lead is visualized.  LEFT VENTRICLE PLAX 2D LVIDd:         5.10 cm     Diastology LVIDs:         3.90 cm     LV e' medial:    6.09 cm/s LV PW:         1.50 cm     LV E/e' medial:  6.3 LV IVS:        1.40 cm     LV e' lateral:   7.29 cm/s LVOT diam:     2.20 cm     LV E/e' lateral: 5.2 LV SV:         43 LV SV Index:   19 LVOT Area:     3.80 cm  LV Volumes (MOD) LV vol d, MOD A2C: 59.2 ml LV vol d, MOD A4C: 89.6 ml LV vol s, MOD A2C: 42.9 ml LV vol s, MOD A4C: 54.4 ml LV SV MOD A2C:     16.3 ml LV SV MOD A4C:     89.6 ml LV SV MOD BP:      26.6 ml RIGHT VENTRICLE RV Basal diam:  3.85 cm RV Mid diam:    3.50 cm RV S prime:     8.05 cm/s TAPSE (M-mode): 2.0 cm LEFT ATRIUM             Index        RIGHT ATRIUM           Index LA diam:        4.20 cm 1.88 cm/m   RA Area:     19.90 cm LA Vol (A2C):   36.5 ml 16.36 ml/m  RA Volume:   57.40 ml  25.73 ml/m LA Vol (A4C):   33.1 ml 14.84 ml/m LA Biplane Vol: 35.6 ml 15.96 ml/m  AORTIC VALVE                    PULMONIC VALVE AV Area (Vmax):    3.28 cm     PV Vmax:       0.53 m/s AV Area (Vmean):   2.81 cm     PV Peak grad:  1.1 mmHg AV Area (VTI):     2.82 cm AV Vmax:           79.70 cm/s AV Vmean:          52.000 cm/s AV VTI:            0.151 m AV Peak Grad:      2.5 mmHg AV Mean Grad:      1.0 mmHg LVOT Vmax:         68.80 cm/s LVOT Vmean:  38.500 cm/s LVOT VTI:          0.112 m LVOT/AV VTI ratio: 0.74  AORTA Ao Root diam: 3.70 cm MITRAL VALVE               TRICUSPID VALVE MV Area (PHT): 6.12  cm    TR Peak grad:   15.7 mmHg MV Area VTI:   3.11 cm    TR Vmax:        198.00 cm/s MV Peak grad:  2.2 mmHg MV Mean grad:  1.0 mmHg    SHUNTS MV Vmax:       0.74 m/s    Systemic VTI:  0.11 m MV Vmean:      43.1 cm/s   Systemic Diam: 2.20 cm MV Decel Time: 124 msec MV E velocity: 38.10 cm/s MV A velocity: 64.80 cm/s MV E/A ratio:  0.59 Dwayne D Callwood MD Electronically signed by Alwyn Pea MD Signature Date/Time: 10/03/2022/1:24:25 PM    Final    US Venous Img Lower Bilateral (DVT)  Result Date: 10/03/2022 CLINICAL DATA:  Lower extremity edema, chest pain EXAM: BILATERAL LOWER EXTREMITY VENOUS DOPPLER ULTRASOUND TECHNIQUE: Gray-scale sonography with graded compression, as well as color Doppler and duplex ultrasound were performed to evaluate the lower extremity deep venous systems from the level of the common femoral vein and including the common femoral, femoral, profunda femoral, popliteal and calf veins including the posterior tibial, peroneal and gastrocnemius veins when visible. The superficial great saphenous vein was also interrogated. Spectral Doppler was utilized to evaluate flow at rest and with distal augmentation maneuvers in the common femoral, femoral and popliteal veins. COMPARISON:  None Available. FINDINGS: RIGHT LOWER EXTREMITY Common Femoral Vein: No evidence of thrombus. Normal compressibility, respiratory phasicity and response to augmentation. Saphenofemoral Junction: No evidence of thrombus. Normal compressibility and flow on color Doppler imaging. Profunda Femoral Vein: No evidence of thrombus. Normal compressibility and flow on color Doppler imaging. Femoral Vein: No evidence of thrombus. Normal compressibility, respiratory phasicity and response to augmentation. Popliteal Vein: No evidence of thrombus. Normal compressibility, respiratory phasicity and response to augmentation. Calf Veins: No evidence of thrombus. Normal compressibility and flow on color Doppler imaging.  Superficial Great Saphenous Vein: No evidence of thrombus. Normal compressibility. Venous Reflux:  None. Other Findings:  None. LEFT LOWER EXTREMITY Common Femoral Vein: No evidence of thrombus. Normal compressibility, respiratory phasicity and response to augmentation. Saphenofemoral Junction: Thrombus is present within the great saphenous vein extending almost to the saphenofemoral junction. Profunda Femoral Vein: No evidence of thrombus. Normal compressibility and flow on color Doppler imaging. Femoral Vein: No evidence of thrombus. Normal compressibility, respiratory phasicity and response to augmentation. Popliteal Vein: No evidence of thrombus. Normal compressibility, respiratory phasicity and response to augmentation. Calf Veins: No evidence of thrombus. Normal compressibility and flow on color Doppler imaging. Superficial Great Saphenous Vein: Thrombus in the upper thigh. Venous Reflux:  None. Other Findings:  None. IMPRESSION: 1. Superficial thrombosis of the great saphenous vein in the upper thigh extending almost to the saphenofemoral junction but not into the common femoral vein. 2. No evidence of deep venous thrombosis in the right lower extremity. Electronically Signed   By: Malachy Moan M.D.   On: 10/03/2022 10:17   DG Chest 2 View  Result Date: 10/02/2022 CLINICAL DATA:  cp EXAM: CHEST - 2 VIEW COMPARISON:  Chest x-ray 11/07/2020 FINDINGS: Left chest wall single lead cardiac defibrillator. The heart and mediastinal contours are unchanged. No focal consolidation. No pulmonary edema. No  pleural effusion. No pneumothorax. No acute osseous abnormality. IMPRESSION: No active cardiopulmonary disease. Electronically Signed   By: Tish Frederickson M.D.   On: 10/02/2022 19:52    ECHO pending  TELEMETRY reviewed by me [facility]) 10/04/2022: NSR rate 90s, PVCs  EKG reviewed by me: Sinus tachycardia rate 105 bpm, nonspecific ST T wave changes, nonacute  Data reviewed by me [facility]) 10/04/2022: last 24h vitals  tele labs imaging I/O ED hospitalist progress note, vascular surgery notes  Principal Problem:   Chest pain Active Problems:   Chronic combined systolic (congestive) and diastolic (congestive) heart failure (HCC)   Essential hypertension   Hypercholesteremia   ICD (implantable cardioverter-defibrillator) in place   Sleep apnea   Dilated cardiomyopathy (HCC)   Mixed hyperlipidemia   Anxiety   Chronic pain syndrome   Depression   Morbid obesity (HCC)   Pulmonary emboli (HCC)   Elevated troponin    ASSESSMENT AND PLAN:  Lisa Fuentes is a 62 y.o. female  with a past medical history of nonischemic dilated cardiomyopathy, HFrEF with LVEF 40 to 45%, history of ventricular tachycardia, status post ICD, hypertension, hyperlipidemia, obesity, OSA who presented to the ED on 10/02/2022 for chest pain. Cardiology was consulted for further evaluation.   # Chest pain # Acute pulmonary emboli with right heart strain Patient presenting with chest pain onset yesterday which was persistent throughout the day.  Had associated shortness of breath, symptoms were worse with exertion.  Troponins trended 52 > 26, EKG nonacute.  Pain improved with morphine, patient not given nitroglycerin. CT chest positive for moderate size PE s/p thrombectomy on 10/03/2022. Reports pain has resolved this AM.  -Vascular following -Heparin > eliquis today  # Non-ischemic dilated cardiomyopathy s/p Medtronic ICD 2008 # Chronic HFrEF Patient with chronically reduced heart function and nonischemic dilated cardiomyopathy status post ICD placement in 2008.  Endorses strict compliance with home GDMT regimen and no recent issues with worsening shortness of breath or lower extremity edema.  Has chronic, stable leg swelling for which she is attempting to establish with vein/vascular clinic. Echo this admission with EF 30-35%, grade I diastolic dysfunction. -Home GDMT restarted - entresto 24/26, metoprolol succinate 50, spironolactone  12.5, jardiance 10  # Obstructive sleep apnea Patient with known sleep apnea, desaturations noted to the 80s overnight that she was placed on 2 L. Now weaned to RA. -Monitor oxygen status.  Patient stable for discharge today from cardiac perspective with follow up in clinic in 1-2 weeks.   This patient's plan of care was discussed and created with Dr. Juliann Pares and he is in agreement.  Signed: Gale Journey, PA-C  10/04/2022, 12:06 PM [facility]

## 2022-10-08 DIAGNOSIS — I5022 Chronic systolic (congestive) heart failure: Secondary | ICD-10-CM | POA: Diagnosis not present

## 2022-10-08 DIAGNOSIS — I5020 Unspecified systolic (congestive) heart failure: Secondary | ICD-10-CM | POA: Diagnosis not present

## 2022-10-08 DIAGNOSIS — I10 Essential (primary) hypertension: Secondary | ICD-10-CM | POA: Diagnosis not present

## 2022-10-08 DIAGNOSIS — Z95810 Presence of automatic (implantable) cardiac defibrillator: Secondary | ICD-10-CM | POA: Diagnosis not present

## 2022-10-08 DIAGNOSIS — Z23 Encounter for immunization: Secondary | ICD-10-CM | POA: Diagnosis not present

## 2022-10-08 DIAGNOSIS — I519 Heart disease, unspecified: Secondary | ICD-10-CM | POA: Diagnosis not present

## 2022-10-08 DIAGNOSIS — I428 Other cardiomyopathies: Secondary | ICD-10-CM | POA: Diagnosis not present

## 2022-10-08 DIAGNOSIS — I2694 Multiple subsegmental pulmonary emboli without acute cor pulmonale: Secondary | ICD-10-CM | POA: Diagnosis not present

## 2022-10-08 NOTE — Group Note (Deleted)

## 2022-10-10 ENCOUNTER — Emergency Department: Payer: Medicare HMO

## 2022-10-10 ENCOUNTER — Emergency Department
Admission: EM | Admit: 2022-10-10 | Discharge: 2022-10-10 | Disposition: A | Discharge status: Home / Self Care | Payer: Medicare HMO | Attending: Emergency Medicine | Admitting: Emergency Medicine

## 2022-10-10 DIAGNOSIS — R0602 Shortness of breath: Secondary | ICD-10-CM | POA: Insufficient documentation

## 2022-10-10 DIAGNOSIS — R0789 Other chest pain: Secondary | ICD-10-CM | POA: Diagnosis not present

## 2022-10-10 DIAGNOSIS — Z7901 Long term (current) use of anticoagulants: Secondary | ICD-10-CM | POA: Diagnosis not present

## 2022-10-10 DIAGNOSIS — R079 Chest pain, unspecified: Secondary | ICD-10-CM | POA: Diagnosis not present

## 2022-10-10 DIAGNOSIS — I110 Hypertensive heart disease with heart failure: Secondary | ICD-10-CM | POA: Diagnosis not present

## 2022-10-10 DIAGNOSIS — I509 Heart failure, unspecified: Secondary | ICD-10-CM | POA: Diagnosis not present

## 2022-10-10 DIAGNOSIS — Z95810 Presence of automatic (implantable) cardiac defibrillator: Secondary | ICD-10-CM | POA: Diagnosis not present

## 2022-10-10 LAB — CBC
HCT: 39.7 % (ref 36.0–46.0)
Hemoglobin: 12.8 g/dL (ref 12.0–15.0)
MCH: 31 pg (ref 26.0–34.0)
MCHC: 32.2 g/dL (ref 30.0–36.0)
MCV: 96.1 fL (ref 80.0–100.0)
Platelets: 173 10*3/uL (ref 150–400)
RBC: 4.13 MIL/uL (ref 3.87–5.11)
RDW: 13.8 % (ref 11.5–15.5)
WBC: 5.4 10*3/uL (ref 4.0–10.5)
nRBC: 0 % (ref 0.0–0.2)

## 2022-10-10 LAB — TROPONIN I (HIGH SENSITIVITY)
Troponin I (High Sensitivity): <2 ng/L (ref <18)
Troponin I (High Sensitivity): <2 ng/L (ref <18)

## 2022-10-10 LAB — BASIC METABOLIC PANEL
Anion gap: 8 (ref 5–15)
BUN: 14 mg/dL (ref 6–20)
CO2: 23 mmol/L (ref 22–32)
Calcium: 9.1 mg/dL (ref 8.9–10.3)
Chloride: 106 mmol/L (ref 98–111)
Creatinine, Ser: 0.88 mg/dL (ref 0.44–1.00)
GFR, Estimated: >60 mL/min (ref >60)
Glucose, Bld: 105 mg/dL — ABNORMAL HIGH (ref 70–99)
Potassium: 4.2 mmol/L (ref 3.5–5.1)
Sodium: 137 mmol/L (ref 135–145)

## 2022-10-10 NOTE — ED Triage Notes (Signed)
Pt c/o midsternal chest pressure and SOB that started today. Pt recently diagnosed with PE and on blood thinner.

## 2022-10-10 NOTE — ED Provider Notes (Signed)
[facility] Provider Note    Event Date/Time   First MD Initiated Contact with Patient 10/10/22 1027     (approximate)   History   Chief Complaint: Shortness of Breath   HPI  Lisa Fuentes is a 62 y.o. female with a history of heart failure, hypertension, pulmonary embolism status post endovascular thrombolysis and thrombectomy 1 week ago who comes ED complaining of intermittent midsternal chest pressure and shortness of breath that started today.  Intermittent episodes lasting about 2 minutes at a time.  Onset during rest.  Not exertional.  Not pleuritic.  Not feel like symptoms she had a week ago when she was found to have PE.  No increasing leg swelling or pain.  She has been compliant with Eliquis as prescribed for the past week     Physical Exam   Triage Vital Signs: ED Triage Vitals  Encounter Vitals Group     BP 10/10/22 1004 110/78     Systolic BP Percentile --      Diastolic BP Percentile --      Pulse Rate 10/10/22 1004 76     Resp 10/10/22 1004 16     Temp 10/10/22 1004 98 F (36.7 C)     Temp src --      SpO2 10/10/22 1004 95 %     Weight 10/10/22 1004 251 lb (113.9 kg)     Height 10/10/22 1004 5\' 7"  (1.702 m)     Head Circumference --      Peak Flow --      Pain Score 10/10/22 1003 3     Pain Loc --      Pain Education --      Exclude from Growth Chart --     Most recent vital signs: Vitals:   10/10/22 1230 10/10/22 1300  BP: 103/73 107/72  Pulse: 71 66  Resp: 14 (!) 22  Temp:    SpO2: 96% 95%    General: Awake, no distress.  CV:  Good peripheral perfusion.  Regular rate and rhythm. Resp:  Normal effort.  Clear to auscultation bilaterally Abd:  No distention.  Soft nontender Other:  No lower extremity edema or calf tenderness.  Symmetric calf circumference.   ED Results / Procedures / Treatments   Labs (all labs ordered are listed, but only abnormal results are displayed) Labs Reviewed  BASIC METABOLIC PANEL -  Abnormal; Notable for the following components:      Result Value   Glucose, Bld 105 (*)    All other components within normal limits  CBC  TROPONIN I (HIGH SENSITIVITY)  TROPONIN I (HIGH SENSITIVITY)     EKG Interpreted by me Sinus rhythm rate of 65.  Normal axis and intervals.  Poor R wave progression.  Normal ST segments and T waves.  No evidence of right heart strain.   RADIOLOGY Chest x-ray interpreted by me, unremarkable.  Radiology report reviewed   PROCEDURES:  Procedures   MEDICATIONS ORDERED IN ED: Medications - No data to display   IMPRESSION / MDM / ASSESSMENT AND PLAN / ED COURSE  I reviewed the triage vital signs and the nursing notes.  DDx: Pneumothorax, pleural effusion, pneumonia, non-STEMI, GERD.  Doubt PE, dissection, pericardial effusion.  Patient's presentation is most consistent with acute presentation with potential threat to life or bodily function.  Patient presents with intermittent atypical chest pressure.  Compliant with anticoagulation.  Will obtain chest x-ray and labs today.   ----------------------------------------- 1:40 PM  on 10/10/2022 ----------------------------------------- Vitals and labs all remain normal today.  Symptoms resolved.  Counseled patient to continue Eliquis as prescribed and follow-up with your doctor.  Return if worsening symptoms.  Doubt ACS or worsening VTE.      FINAL CLINICAL IMPRESSION(S) / ED DIAGNOSES   Final diagnoses:  Nonspecific chest pain     Rx / DC Orders   ED Discharge Orders          Ordered    Ambulatory referral to Cardiology       Comments: If you have not heard from the Cardiology office within the next 72 hours please call [phone].   10/10/22 1339             Note:  This document was prepared using Dragon voice recognition software and may include unintentional dictation errors.   Sharman Cheek, MD 10/10/22 1341

## 2022-10-16 DIAGNOSIS — I2694 Multiple subsegmental pulmonary emboli without acute cor pulmonale: Secondary | ICD-10-CM | POA: Diagnosis not present

## 2022-10-16 DIAGNOSIS — Z23 Encounter for immunization: Secondary | ICD-10-CM | POA: Diagnosis not present

## 2022-10-16 DIAGNOSIS — I420 Dilated cardiomyopathy: Secondary | ICD-10-CM | POA: Diagnosis not present

## 2022-10-16 DIAGNOSIS — I82461 Acute embolism and thrombosis of right calf muscular vein: Secondary | ICD-10-CM | POA: Diagnosis not present

## 2022-10-16 DIAGNOSIS — I5020 Unspecified systolic (congestive) heart failure: Secondary | ICD-10-CM | POA: Diagnosis not present

## 2022-10-16 DIAGNOSIS — I110 Hypertensive heart disease with heart failure: Secondary | ICD-10-CM | POA: Diagnosis not present

## 2022-10-18 ENCOUNTER — Telehealth: Payer: Self-pay | Admitting: Cardiovascular Disease

## 2022-10-18 NOTE — Telephone Encounter (Signed)
Patient would like to switch from Dr. Kirke Corin to Dr. Sandie Ano. Please confirm transfer

## 2022-10-18 NOTE — Telephone Encounter (Signed)
Fine with me

## 2022-10-30 ENCOUNTER — Encounter: Payer: Self-pay | Admitting: Cardiology

## 2022-10-30 ENCOUNTER — Ambulatory Visit: Payer: Medicare HMO | Attending: Cardiology | Admitting: Cardiology

## 2022-10-30 VITALS — BP 110/78 | HR 62 | Ht 67.0 in | Wt 258.0 lb

## 2022-10-30 DIAGNOSIS — I2699 Other pulmonary embolism without acute cor pulmonale: Secondary | ICD-10-CM | POA: Diagnosis not present

## 2022-10-30 DIAGNOSIS — I428 Other cardiomyopathies: Secondary | ICD-10-CM

## 2022-10-30 DIAGNOSIS — Z95810 Presence of automatic (implantable) cardiac defibrillator: Secondary | ICD-10-CM | POA: Diagnosis not present

## 2022-10-30 MED ORDER — SEMAGLUTIDE-WEIGHT MANAGEMENT 0.5 MG/0.5ML ~~LOC~~ SOAJ
0.5000 mg | SUBCUTANEOUS | 0 refills | Status: DC
Start: 2022-11-28 — End: 2022-12-10

## 2022-10-30 MED ORDER — SEMAGLUTIDE-WEIGHT MANAGEMENT 0.25 MG/0.5ML ~~LOC~~ SOAJ
0.2500 mg | SUBCUTANEOUS | 0 refills | Status: DC
Start: 2022-10-30 — End: 2022-10-30

## 2022-10-30 MED ORDER — SEMAGLUTIDE-WEIGHT MANAGEMENT 0.25 MG/0.5ML ~~LOC~~ SOAJ: 0.2500 mg | SUBCUTANEOUS | 0 refills | Status: DC

## 2022-10-30 MED ORDER — SEMAGLUTIDE-WEIGHT MANAGEMENT 1.7 MG/0.75ML ~~LOC~~ SOAJ
1.7000 mg | SUBCUTANEOUS | 0 refills | Status: DC
Start: 2023-01-25 — End: 2023-02-11

## 2022-10-30 MED ORDER — SEMAGLUTIDE-WEIGHT MANAGEMENT 2.4 MG/0.75ML ~~LOC~~ SOAJ
2.4000 mg | SUBCUTANEOUS | 0 refills | Status: DC
Start: 2023-02-23 — End: 2023-02-11

## 2022-10-30 MED ORDER — SEMAGLUTIDE-WEIGHT MANAGEMENT 1 MG/0.5ML ~~LOC~~ SOAJ: 1.0000 mg | SUBCUTANEOUS | 0 refills | Status: AC

## 2022-10-30 NOTE — Patient Instructions (Signed)
Medication Instructions:   Start taking Wegovy  Month 1: 0.25 mg once a week.  Month 2: 0.5 mg once a week. (Call or send [place] a MyChart message when you are on week 2, so we can send your next dose in for you).  Month 3: 1 mg once a week.  Month 4: 1.7 mg once a week.  Month 5 and beyond: 2.4 mg once a week (maintenance dose)  *If you need a refill on your cardiac medications before your next appointment, please call your pharmacy*   Lab Work:  None Ordered  If you have labs (blood work) drawn today and your tests are completely normal, you will receive your results only by: MyChart Message (if you have MyChart) OR A paper copy in the mail If you have any lab test that is abnormal or we need to change your treatment, we will call you to review the results.   Testing/Procedures:  None Ordered   Follow-Up: At [facility], you and your health needs are our priority.  As part of our continuing mission to provide you with exceptional heart care, we have created designated Provider Care Teams.  These Care Teams include your primary Cardiologist (physician) and Advanced Practice Providers (APPs -  Physician Assistants and Nurse Practitioners) who all work together to provide you with the care you need, when you need it.  We recommend signing up for the patient portal called "MyChart".  Sign up information is provided on this After Visit Summary.  MyChart is used to connect with patients for Virtual Visits (Telemedicine).  Patients are able to view lab/test results, encounter notes, upcoming appointments, etc.  Non-urgent messages can be sent to your provider as well.   To learn more about what you can do with MyChart, go to [email or web address].    Your next appointment:   3 month(s)  Provider:   You may see Debbe Odea, MD ONLY

## 2022-10-30 NOTE — Progress Notes (Signed)
Cardiology Office Note:    Date:  10/30/2022   ID:  Lisa Fuentes, DOB 24-Jan-1961, MRN [id number]  PCP:  Jerl Mina, MD   [facility] Providers Cardiologist:  Debbe Odea, MD Electrophysiologist:  Sherryl Manges, MD     Referring MD: Jerl Mina, MD   Chief Complaint  Patient presents with   Follow-up    Transferring primary cardiology care back to [facility].  Last seen by Dr. Kirke Corin on 04/28/21 for primary cardiology care.  Has been seen by [facility] Cardiology with last visit on 10/08/22.    History of Present Illness:    Lisa Fuentes is a 62 y.o. female with a hx of NICM s/p ICD 2008, GEN change 2015, hypertension, hyperlipidemia, PE s/p endovascular thrombolysis 9/24 on Eliquis, OSA presents to reestablish care.  Recently being managed by [facility] cardiology from a cardiac perspective.  Evaluated by [facility] in the past.  Recently had symptoms of shortness of breath, presented to the ED, diagnosed with PE, underwent pulmonary thrombolysis, started on Eliquis.  Takes Lasix 20 mg 3 times weekly.  Compliant with metoprolol, Entresto, 12.5 mg Aldactone.  Blood pressures at home usually in the 80s to 90s.  Denies dizziness or syncope.  Takes thyroid medications, finds it hard to lose weight.  States daughter has heart failure, follows up at heart failure clinic.  Was previously on Jardiance, stopped due to yeast infections.  Echo 9/24 EF 30 to 35% Lexiscan Myoview 9/24 no ischemia. Left heart cath 03/2020 no CAD  Past Medical History:  Diagnosis Date   AICD (automatic cardioverter/defibrillator) present 2008   a.) Medtronic device placed 2008. b.) replaced 06/21/2013 (Medtronic Evera XT single-chamber ICD; model number [id number]).   Anemia    Anxiety    Aortic dilatation (HCC) 03/24/2019   a.) TTE 03/24/2019: Ao root measured 37 mm. b.) TTE 03/21/2020: Ao root measured 37 mm.   Arthritis    Depression    Difficult intubation    DOE (dyspnea on exertion)     Endometriosis of vagina 09/2013   Intra-operative findings of endometriosis implants on cervical stump   Essential hypertension    GERD (gastroesophageal reflux disease)    Headache    HFrEF (heart failure with reduced ejection fraction) (HCC)    a.) TTE 02/18/2014: EF 40-45%; tiv AR, mild MR/TR/PR; G1DD. b.) TTE 02/22/2017: EF 45-50%; mild LVH; triv PR; G1DD. c.) TTE 03/24/2019: EF 45-50%; glob HK, mild LVH; triv MR/TR/PR. d.) TTE 03/21/2020: EF 40-45%; glob HK.   History of 2019 novel coronavirus disease (COVID-19) 02/02/2020   History of 2019 novel coronavirus disease (COVID-19) 02/02/2020   Hypokalemia    Hypothyroidism    a.) s/p radioactive iodine Tx; on levothyroxine   Mixed hyperlipidemia    Nonischemic cardiomyopathy (HCC)    a.) TTE 02/18/2014: EF 40-45%. b.) TTE 02/22/2017: EF 45-50%. c.) TTE 03/24/2019: EF 45-50%. d.) TTE 03/21/2020: EF 40-45%   OSA on CPAP    Panic attacks    PSVT (paroxysmal supraventricular tachycardia) (HCC) 03/30/2020   a.) Holter 03/30/2020 --> 2 runs; fastest lasting 4 beats (184 bpm); longest lasting 4 beats (113 bpm)   Tachycardia     Past Surgical History:  Procedure Laterality Date   ANTERIOR AND POSTERIOR REPAIR N/A 11/25/2017   Procedure: ANTERIOR (CYSTOCELE) AND POSTERIOR REPAIR (RECTOCELE);  Surgeon: Hildred Laser, MD;  Location: [facility] ORS;  Service: Gynecology;  Laterality: N/A;   BREAST CYST ASPIRATION Left 03/22/2014   neg/ done by Dr Evette Cristal  CARDIAC CATHETERIZATION  2003   [facility]: No significant coronary artery disease with reduced ejection fraction.   CARDIAC DEFIBRILLATOR PLACEMENT  04/2006   replaced 05/2013   CARDIAC DEFIBRILLATOR PLACEMENT  06/21/2013   Procedure:  CARDIAC DEFIBRILLATOR PLACEMENT (Medtronic single-chamber ICD, model number Lianne Moris, model number [id number]): Location: [facility]; Surgeon: Gerre Pebbles, MD   CARPAL TUNNEL RELEASE Right    COLONOSCOPY  2015   COLONOSCOPY N/A 10/13/2020   Procedure: COLONOSCOPY;   Surgeon: Jaynie Collins, DO;  Location: [facility] ENDOSCOPY;  Service: Gastroenterology;  Laterality: N/A;   COLONOSCOPY WITH PROPOFOL N/A 10/04/2015   Procedure: COLONOSCOPY WITH PROPOFOL;  Surgeon: Midge Minium, MD;  Location: [facility] ENDOSCOPY;  Service: Endoscopy;  Laterality: N/A;   CORONARY ANGIOPLASTY     CYSTOSCOPY  11/15/2014   Procedure: CYSTOSCOPY;  Surgeon: Hildred Laser, MD;  Location: [facility] ORS;  Service: Gynecology;;   DIAGNOSTIC LAPAROSCOPY     ETHMOIDECTOMY Right 07/19/2021   Procedure: TOTAL ETHMOIDECTOMY WITH FRONTAL SINUS EXPLORATION;  Surgeon: Vernie Murders, MD;  Location: [facility] ORS;  Service: ENT;  Laterality: Right;   FOOT SURGERY Bilateral    heer spur and bunion   IMAGE GUIDED SINUS SURGERY N/A 07/19/2021   Procedure: IMAGE GUIDED SINUS SURGERY;  Surgeon: Vernie Murders, MD;  Location: [facility] ORS;  Service: ENT;  Laterality: N/A;   KNEE ARTHROSCOPY Left 03/02/2015   Procedure: ARTHROSCOPY  LEFT KNEE, PARTIAL LATERAL  MENISECTOMY, SYNOVECTOMY, MEDIAL & LATERAL CHONDROPLASTY;  Surgeon: Juanell Fairly, MD;  Location: [facility] ORS;  Service: Orthopedics;  Laterality: Left;   LAPAROSCOPIC SALPINGO OOPHERECTOMY Left 11/15/2014   Procedure: LAPAROSCOPIC OOPHORECTOMY;  Surgeon: Hildred Laser, MD;  Location: [facility] ORS;  Service: Gynecology;  Laterality: Left;   LAPAROSCOPIC SALPINGOOPHERECTOMY Right 09/2013   also with left salpingectomy   LEFT HEART CATH AND CORONARY ANGIOGRAPHY N/A 03/28/2020   Procedure: LEFT HEART CATH AND CORONARY ANGIOGRAPHY;  Surgeon: Iran Ouch, MD;  Location: [facility] INVASIVE CV LAB;  Service: Cardiovascular;  Laterality: N/A;   MAXILLARY ANTROSTOMY Right 07/19/2021   Procedure: MAXILLARY ANTROSTOMY WITH REMOVAL TISSUE;  Surgeon: Vernie Murders, MD;  Location: [facility] ORS;  Service: ENT;  Laterality: Right;   MINOR HEMORRHOIDECTOMY     NASAL TURBINATE REDUCTION Bilateral 07/19/2021   Procedure: INFERIOR TURBINATE REDUCTION;  Surgeon: Vernie Murders, MD;  Location:  [facility] ORS;  Service: ENT;  Laterality: Bilateral;   PULMONARY THROMBECTOMY Bilateral 10/03/2022   Procedure: PULMONARY THROMBECTOMY;  Surgeon: Annice Needy, MD;  Location: [facility] INVASIVE CV LAB;  Service: Cardiovascular;  Laterality: Bilateral;   SEPTOPLASTY N/A 07/19/2021   Procedure: SEPTOPLASTY;  Surgeon: Vernie Murders, MD;  Location: [facility] ORS;  Service: ENT;  Laterality: N/A;   TONSILLECTOMY     TOTAL ABDOMINAL HYSTERECTOMY     TRACHELECTOMY N/A 11/15/2014   Procedure: TRACHELECTOMY;  Surgeon: Hildred Laser, MD;  Location: [facility] ORS;  Service: Gynecology;  Laterality: N/A;   TRIGGER FINGER RELEASE Right    TUBAL LIGATION Bilateral     Current Medications: Current Meds  Medication Sig   Acetaminophen (TYLENOL ARTHRITIS PAIN PO) Take 1,300 mg by mouth every 6 (six) hours as needed (Pain).   apixaban (ELIQUIS) 5 MG TABS tablet Take 5 mg by mouth 2 (two) times daily.   cyclobenzaprine (FLEXERIL) 10 MG tablet Take 1 tablet (10 mg total) by mouth 3 (three) times daily as needed for muscle spasms.   ENTRESTO 24-26 MG Take 1 tablet by mouth 2 (two) times daily.   esomeprazole (NEXIUM) 40 MG capsule Take 40  mg by mouth every morning.    furosemide (LASIX) 20 MG tablet Take 20 mg by mouth daily.   gabapentin (NEURONTIN) 100 MG capsule Take 100 mg by mouth 3 (three) times daily.   levothyroxine (SYNTHROID) 125 MCG tablet Take 125 mcg by mouth every morning.   Magnesium 250 MG TABS Take 1 tablet by mouth daily.   metoprolol succinate (TOPROL-XL) 50 MG 24 hr tablet TAKE 1 TABLET BY MOUTH ONCE DAILY WITH  OR  IMMEDIATELY  FOLLOWING  A  MEAL   oxyCODONE-acetaminophen (PERCOCET/ROXICET) 5-325 MG tablet Take 1 tablet by mouth every 4 (four) hours as needed for severe pain.   Semaglutide-Weight Management 0.25 MG/0.5ML SOAJ Inject 0.25 mg into the skin once a week. For 4 weeks   [START ON 11/28/2022] Semaglutide-Weight Management 0.5 MG/0.5ML SOAJ Inject 0.5 mg into the skin once a week for 28 days.   [START  ON 12/27/2022] Semaglutide-Weight Management 1 MG/0.5ML SOAJ Inject 1 mg into the skin once a week for 28 days.   [START ON 01/25/2023] Semaglutide-Weight Management 1.7 MG/0.75ML SOAJ Inject 1.7 mg into the skin once a week for 28 days.   [START ON 02/23/2023] Semaglutide-Weight Management 2.4 MG/0.75ML SOAJ Inject 2.4 mg into the skin once a week for 28 days.   sertraline (ZOLOFT) 100 MG tablet Take 200 mg by mouth daily.   spironolactone (ALDACTONE) 25 MG tablet Take 1/2 (one-half) tablet by mouth once daily   [DISCONTINUED] Semaglutide-Weight Management 0.25 MG/0.5ML SOAJ Inject 0.25 mg into the skin once a week for 28 days.     Allergies:   Sulfa antibiotics, Sulfamethoxazole-trimethoprim, and Jardiance [empagliflozin]   Social History   Socioeconomic History   Marital status: Married    Spouse name: Jimmy   Number of children: 2   Years of education: Not on file   Highest education level: Not on file  Occupational History   Not on file  Tobacco Use   Smoking status: Never   Smokeless tobacco: Never  Vaping Use   Vaping status: Never Used  Substance and Sexual Activity   Alcohol use: Yes    Alcohol/week: 0.0 standard drinks of alcohol    Comment: occassionally   Drug use: No   Sexual activity: Not Currently    Birth control/protection: Surgical  Other Topics Concern   Not on file  Social History Narrative   Not on file   Social Determinants of Health   Financial Resource Strain: Low Risk  (01/08/2022)   Received from [facility], [facility]   Overall Financial Resource Strain (CARDIA)    Difficulty of Paying Living Expenses: Not hard at all  Food Insecurity: No Food Insecurity (10/03/2022)   Hunger Vital Sign    Worried About Running Out of Food in the Last Year: Never true    Ran Out of Food in the Last Year: Never true  Transportation Needs: No Transportation Needs (10/03/2022)   PRAPARE - Administrator, Civil Service  (Medical): No    Lack of Transportation (Non-Medical): No  Physical Activity: Not on file  Stress: Not on file  Social Connections: Not on file     Family History: The patient's family history includes Breast cancer (age of onset: 33) in her sister; Colon cancer in her mother; Hyperlipidemia in her mother; Hypertension in her mother; Ovarian cancer in her sister; Stroke in her mother; Throat cancer in her brother.  ROS:   Please see the history of present illness.  All other systems reviewed and are negative.  EKGs/Labs/Other Studies Reviewed:    The following studies were reviewed today:  EKG Interpretation Date/Time:  Tuesday October 30 2022 10:03:31 EDT Ventricular Rate:  62 PR Interval:  150 QRS Duration:  76 QT Interval:  416 QTC Calculation: 422 R Axis:   0  Text Interpretation: Normal sinus rhythm Minimal voltage criteria for LVH, may be normal variant ( R in aVL ) Nonspecific ST and T wave abnormality Confirmed by Debbe Odea ([id number]) on 10/30/2022 10:12:10 AM    Recent Labs: 10/10/2022: BUN 14; Creatinine, Ser 0.88; Hemoglobin 12.8; Platelets 173; Potassium 4.2; Sodium 137  Recent Lipid Panel No results found for: "CHOL", "TRIG", "HDL", "CHOLHDL", "VLDL", "LDLCALC", "LDLDIRECT"   Risk Assessment/Calculations:             Physical Exam:    VS:  BP 110/78 (BP Location: Left Arm, Patient Position: Sitting, Cuff Size: Large)   Pulse 62   Ht 5\' 7"  (1.702 m)   Wt 258 lb (117 kg)   SpO2 96%   BMI 40.41 kg/m     Wt Readings from Last 3 Encounters:  10/30/22 258 lb (117 kg)  10/10/22 251 lb (113.9 kg)  10/02/22 252 lb (114.3 kg)     GEN:  Well nourished, well developed in no acute distress HEENT: Normal NECK: No JVD; No carotid bruits CARDIAC: RRR, no murmurs, rubs, gallops RESPIRATORY:  Clear to auscultation without rales, wheezing or rhonchi  ABDOMEN: Soft, non-tender, non-distended MUSCULOSKELETAL:  No edema; No deformity  SKIN: Warm and  dry NEUROLOGIC:  Alert and oriented x 3 PSYCHIATRIC:  Normal affect   ASSESSMENT:    1. Nonischemic cardiomyopathy (HCC)   2. ICD (implantable cardioverter-defibrillator) in place   3. Morbid obesity (HCC)   4. Pulmonary embolism without acute cor pulmonale, unspecified chronicity, unspecified pulmonary embolism type (HCC)    PLAN:    In order of problems listed above:  Nonischemic cardiomyopathy EF 35% s/p AICD.  Appears euvolemic.  Etiology possibly [place] 3 with daughter also having heart failure.  Low normal BPs at home.  Continue Toprol-XL 50 mg daily, Entresto 24-26 mg twice daily, Aldactone 12.5 mg daily.  Refer to heart failure clinic. S/p AICD, establish care with device clinic. Morbid obesity, history of PEs, heart failure.  Low-calorie diet, weight loss advised.  Start [employer]. PE s/p thrombectomy.  On Eliquis.  Keep appointment with vascular surgery.  Referred to hematology for coagulation workup.  Follow-up in 3 months.     Medication Adjustments/Labs and Tests Ordered: Current medicines are reviewed at length with the patient today.  Concerns regarding medicines are outlined above.  Orders Placed This Encounter  Procedures   Ambulatory referral to Cardiac Electrophysiology   Ambulatory referral to Hematology / Oncology   AMB referral to CHF clinic   EKG 12-Lead   Meds ordered this encounter  Medications   DISCONTD: Semaglutide-Weight Management 0.25 MG/0.5ML SOAJ    Sig: Inject 0.25 mg into the skin once a week for 28 days.    Dispense:  2 mL    Refill:  0   Semaglutide-Weight Management 0.5 MG/0.5ML SOAJ    Sig: Inject 0.5 mg into the skin once a week for 28 days.    Dispense:  2 mL    Refill:  0   Semaglutide-Weight Management 1 MG/0.5ML SOAJ    Sig: Inject 1 mg into the skin once a week for 28 days.    Dispense:  2 mL  Refill:  0   Semaglutide-Weight Management 1.7 MG/0.75ML SOAJ    Sig: Inject 1.7 mg into the skin once a week for 28 days.     Dispense:  3 mL    Refill:  0   Semaglutide-Weight Management 2.4 MG/0.75ML SOAJ    Sig: Inject 2.4 mg into the skin once a week for 28 days.    Dispense:  3 mL    Refill:  0   Semaglutide-Weight Management 0.25 MG/0.5ML SOAJ    Sig: Inject 0.25 mg into the skin once a week. For 4 weeks    Dispense:  2 mL    Refill:  0    Order Specific Question:   Lot Number?    Answer:   [id number]    Order Specific Question:   Expiration Date?    Answer:   11/29/2022    Patient Instructions  Medication Instructions:   Start taking Wegovy  Month 1: 0.25 mg once a week.  Month 2: 0.5 mg once a week. (Call or send [place] a MyChart message when you are on week 2, so we can send your next dose in for you).  Month 3: 1 mg once a week.  Month 4: 1.7 mg once a week.  Month 5 and beyond: 2.4 mg once a week (maintenance dose)  *If you need a refill on your cardiac medications before your next appointment, please call your pharmacy*   Lab Work:  None Ordered  If you have labs (blood work) drawn today and your tests are completely normal, you will receive your results only by: MyChart Message (if you have MyChart) OR A paper copy in the mail If you have any lab test that is abnormal or we need to change your treatment, we will call you to review the results.   Testing/Procedures:  None Ordered   Follow-Up: At [facility], you and your health needs are our priority.  As part of our continuing mission to provide you with exceptional heart care, we have created designated Provider Care Teams.  These Care Teams include your primary Cardiologist (physician) and Advanced Practice Providers (APPs -  Physician Assistants and Nurse Practitioners) who all work together to provide you with the care you need, when you need it.  We recommend signing up for the patient portal called "MyChart".  Sign up information is provided on this After Visit Summary.  MyChart is used to connect with patients for  Virtual Visits (Telemedicine).  Patients are able to view lab/test results, encounter notes, upcoming appointments, etc.  Non-urgent messages can be sent to your provider as well.   To learn more about what you can do with MyChart, go to [email or web address].    Your next appointment:   3 month(s)  Provider:   You may see Debbe Odea, MD ONLY   Signed, Debbe Odea, MD  10/30/2022 11:41 AM    [facility]

## 2022-10-31 DIAGNOSIS — Z79899 Other long term (current) drug therapy: Secondary | ICD-10-CM | POA: Diagnosis not present

## 2022-10-31 DIAGNOSIS — M545 Low back pain, unspecified: Secondary | ICD-10-CM | POA: Diagnosis not present

## 2022-10-31 DIAGNOSIS — M47817 Spondylosis without myelopathy or radiculopathy, lumbosacral region: Secondary | ICD-10-CM | POA: Diagnosis not present

## 2022-10-31 DIAGNOSIS — M533 Sacrococcygeal disorders, not elsewhere classified: Secondary | ICD-10-CM | POA: Diagnosis not present

## 2022-10-31 DIAGNOSIS — R202 Paresthesia of skin: Secondary | ICD-10-CM | POA: Diagnosis not present

## 2022-10-31 DIAGNOSIS — M79673 Pain in unspecified foot: Secondary | ICD-10-CM | POA: Diagnosis not present

## 2022-10-31 DIAGNOSIS — G894 Chronic pain syndrome: Secondary | ICD-10-CM | POA: Diagnosis not present

## 2022-11-02 ENCOUNTER — Inpatient Hospital Stay: Payer: Medicare HMO

## 2022-11-02 ENCOUNTER — Encounter (INDEPENDENT_AMBULATORY_CARE_PROVIDER_SITE_OTHER): Payer: Medicare HMO | Admitting: Vascular Surgery

## 2022-11-02 ENCOUNTER — Encounter: Payer: Self-pay | Admitting: Oncology

## 2022-11-02 ENCOUNTER — Inpatient Hospital Stay: Payer: Medicare HMO | Attending: Oncology | Admitting: Oncology

## 2022-11-02 VITALS — BP 105/69 | HR 79 | Temp 96.2°F | Resp 18 | Ht 67.0 in | Wt 252.0 lb

## 2022-11-02 DIAGNOSIS — I2609 Other pulmonary embolism with acute cor pulmonale: Secondary | ICD-10-CM

## 2022-11-02 DIAGNOSIS — Z7901 Long term (current) use of anticoagulants: Secondary | ICD-10-CM | POA: Insufficient documentation

## 2022-11-02 DIAGNOSIS — I2699 Other pulmonary embolism without acute cor pulmonale: Secondary | ICD-10-CM | POA: Insufficient documentation

## 2022-11-02 LAB — ANTITHROMBIN III: AntiThromb III Func: 117 % (ref 75–120)

## 2022-11-03 LAB — CARDIOLIPIN ANTIBODIES, IGG, IGM, IGA
Anticardiolipin IgA: <9 [APL'U]/mL (ref 0–11)
Anticardiolipin IgG: <9 [GPL'U]/mL (ref 0–14)
Anticardiolipin IgM: <9 [MPL'U]/mL (ref 0–12)

## 2022-11-03 LAB — BETA-2-GLYCOPROTEIN I ABS, IGG/M/A
Beta-2 Glyco I IgG: <9 GPI IgG units (ref 0–20)
Beta-2-Glycoprotein I IgA: <9 GPI IgA units (ref 0–25)
Beta-2-Glycoprotein I IgM: 12 GPI IgM units (ref 0–32)

## 2022-11-04 LAB — PROTEIN C, TOTAL: Protein C, Total: 74 % (ref 60–150)

## 2022-11-04 NOTE — Progress Notes (Signed)
Hematology/Oncology Consult note [facility] [phone] Fax:([phone]  Patient Care Team: Jerl Mina, MD as PCP - General (Family Medicine) Duke Salvia, MD as PCP - Electrophysiology (Cardiology) Debbe Odea, MD as PCP - Cardiology (Cardiology) Sherrie Mustache, MD (Internal Medicine) Kieth Brightly, MD (General Surgery) Iran Ouch, MD as Consulting Physician (Cardiology)   Name of the patient: Lisa Fuentes  [id number]  [birth year]    Reason for referral-new diagnosis of acute pulmonary embolism   Referring physician-Dr. Jerl Mina  Date of visit: 11/04/22   History of presenting illness- Patient is a 62 year old female who presented with symptoms of left lower extremity pain and swelling in September 2024.  Bilateral lower extremity ultrasound showed no evidence of DVT in the right lower extremity.  There was a superficial venous thrombus present in the great saphenous vein almost extending to the saphenofemoral junction.  She underwent CT angiogram to rule out PE given symptoms of shortness of breath and was found to have multiple occlusive and nonocclusive filling defects in the lobar segmental and subsegmental pulmonary artery branches with concern for right heart strain.  She was also found to have a mildly reduced left ventricular function globally with an EF of 44% with a moderate-sized area of septal basal fixed defect.  Patient underwent thrombectomy by vascular surgery and was started on Eliquis.  Patient has had evidence of brachial vein thrombosis in July 2023 in her right arm which was noticed after she had an IV line placement.  This was present in the proximal mid upper arm, basal insulin in the mid upper arm proximal forearm and cephalic vein in the forearm as well as distal forearm.  Back then she was on anticoagulation for about 6 months and stopped she was also found to have left lower lobe segmental  and subsegmental pulmonary emboli at that time.  No family history of pulmonary embolism.  Currently tolerating Eliquis without any significant side effects  ECOG PS- 0  Pain scale- 0   Review of systems- Review of Systems  Constitutional:  Negative for chills, fever, malaise/fatigue and weight loss.  HENT:  Negative for congestion, ear discharge and nosebleeds.   Eyes:  Negative for blurred vision.  Respiratory:  Negative for cough, hemoptysis, sputum production, shortness of breath and wheezing.   Cardiovascular:  Negative for chest pain, palpitations, orthopnea and claudication.  Gastrointestinal:  Negative for abdominal pain, blood in stool, constipation, diarrhea, heartburn, melena, nausea and vomiting.  Genitourinary:  Negative for dysuria, flank pain, frequency, hematuria and urgency.  Musculoskeletal:  Negative for back pain, joint pain and myalgias.  Skin:  Negative for rash.  Neurological:  Negative for dizziness, tingling, focal weakness, seizures, weakness and headaches.  Endo/Heme/Allergies:  Does not bruise/bleed easily.  Psychiatric/Behavioral:  Negative for depression and suicidal ideas. The patient does not have insomnia.     Allergies  Allergen Reactions   Sulfa Antibiotics Hives   Sulfamethoxazole-Trimethoprim Hives   Jardiance [Empagliflozin] Rash    Yeast rash    Patient Active Problem List   Diagnosis Date Noted   Pulmonary emboli (HCC) 10/03/2022   Elevated troponin 10/03/2022   Chest pain 10/02/2022   Abnormal cardiovascular stress test    Anxiety 05/27/2019   Chronic pain syndrome 05/27/2019   Depression 05/27/2019   Morbid obesity (HCC) 03/03/2019   Tibialis posterior tendinitis 08/22/2017   Chest pain of uncertain etiology 09/10/2016   Mixed hyperlipidemia 09/10/2016   Low back pain 03/22/2016   Family  history of malignant neoplasm of gastrointestinal tract    Dizziness 03/27/2015   Postoperative state 11/15/2014   LV dysfunction 10/29/2014    Knee pain 10/22/2014   Endometriosis 09/18/2014   ICD (implantable cardioverter-defibrillator) in place 02/02/2014   Chronic combined systolic (congestive) and diastolic (congestive) heart failure [facility])    Essential hypertension    Hypercholesteremia    Sleep apnea 05/12/2013   Hyperthyroidism 05/08/2013   Dilated cardiomyopathy (HCC) 05/08/2013     Past Medical History:  Diagnosis Date   AICD (automatic cardioverter/defibrillator) present 2008   a.) Medtronic device placed 2008. b.) replaced 06/21/2013 (Medtronic Evera XT single-chamber ICD; model number [id number]).   Anemia    Anxiety    Aortic dilatation (HCC) 03/24/2019   a.) TTE 03/24/2019: Ao root measured 37 mm. b.) TTE 03/21/2020: Ao root measured 37 mm.   Arthritis    Depression    Difficult intubation    DOE (dyspnea on exertion)    Endometriosis of vagina 09/2013   Intra-operative findings of endometriosis implants on cervical stump   Essential hypertension    GERD (gastroesophageal reflux disease)    Headache    HFrEF (heart failure with reduced ejection fraction) (HCC)    a.) TTE 02/18/2014: EF 40-45%; tiv AR, mild MR/TR/PR; G1DD. b.) TTE 02/22/2017: EF 45-50%; mild LVH; triv PR; G1DD. c.) TTE 03/24/2019: EF 45-50%; glob HK, mild LVH; triv MR/TR/PR. d.) TTE 03/21/2020: EF 40-45%; glob HK.   History of 2019 novel coronavirus disease (COVID-19) 02/02/2020   History of 2019 novel coronavirus disease (COVID-19) 02/02/2020   Hypokalemia    Hypothyroidism    a.) s/p radioactive iodine Tx; on levothyroxine   Mixed hyperlipidemia    Nonischemic cardiomyopathy (HCC)    a.) TTE 02/18/2014: EF 40-45%. b.) TTE 02/22/2017: EF 45-50%. c.) TTE 03/24/2019: EF 45-50%. d.) TTE 03/21/2020: EF 40-45%   OSA on CPAP    Panic attacks    PSVT (paroxysmal supraventricular tachycardia) (HCC) 03/30/2020   a.) Holter 03/30/2020 --> 2 runs; fastest lasting 4 beats (184 bpm); longest lasting 4 beats (113 bpm)   Tachycardia      Past  Surgical History:  Procedure Laterality Date   ANTERIOR AND POSTERIOR REPAIR N/A 11/25/2017   Procedure: ANTERIOR (CYSTOCELE) AND POSTERIOR REPAIR (RECTOCELE);  Surgeon: Hildred Laser, MD;  Location: [facility];  Service: Gynecology;  Laterality: N/A;   BREAST CYST ASPIRATION Left 03/22/2014   neg/ done by Dr Evette Cristal   CARDIAC CATHETERIZATION  2003   [facility]: No significant coronary artery disease with reduced ejection fraction.   CARDIAC DEFIBRILLATOR PLACEMENT  04/2006   replaced 05/2013   CARDIAC DEFIBRILLATOR PLACEMENT  06/21/2013   Procedure:  CARDIAC DEFIBRILLATOR PLACEMENT (Medtronic single-chamber ICD, model number Lianne Moris, model number [id number]): Location: [facility]; Surgeon: Gerre Pebbles, MD   CARPAL TUNNEL RELEASE Right    COLONOSCOPY  2015   COLONOSCOPY N/A 10/13/2020   Procedure: COLONOSCOPY;  Surgeon: Jaynie Collins, DO;  Location: [facility];  Service: Gastroenterology;  Laterality: N/A;   COLONOSCOPY WITH PROPOFOL N/A 10/04/2015   Procedure: COLONOSCOPY WITH PROPOFOL;  Surgeon: Midge Minium, MD;  Location: [facility];  Service: Endoscopy;  Laterality: N/A;   CORONARY ANGIOPLASTY     CYSTOSCOPY  11/15/2014   Procedure: CYSTOSCOPY;  Surgeon: Hildred Laser, MD;  Location: [facility];  Service: Gynecology;;   DIAGNOSTIC LAPAROSCOPY     ETHMOIDECTOMY Right 07/19/2021   Procedure: TOTAL ETHMOIDECTOMY WITH FRONTAL SINUS EXPLORATION;  Surgeon: Vernie Murders, MD;  Location: [facility];  Service: ENT;  Laterality: Right;   FOOT SURGERY Bilateral    heer spur and bunion   IMAGE GUIDED SINUS SURGERY N/A 07/19/2021   Procedure: IMAGE GUIDED SINUS SURGERY;  Surgeon: Vernie Murders, MD;  Location: [facility];  Service: ENT;  Laterality: N/A;   KNEE ARTHROSCOPY Left 03/02/2015   Procedure: ARTHROSCOPY  LEFT KNEE, PARTIAL LATERAL  MENISECTOMY, SYNOVECTOMY, MEDIAL & LATERAL CHONDROPLASTY;  Surgeon: Juanell Fairly, MD;  Location: [facility];  Service: Orthopedics;  Laterality: Left;    LAPAROSCOPIC SALPINGO OOPHERECTOMY Left 11/15/2014   Procedure: LAPAROSCOPIC OOPHORECTOMY;  Surgeon: Hildred Laser, MD;  Location: [facility];  Service: Gynecology;  Laterality: Left;   LAPAROSCOPIC SALPINGOOPHERECTOMY Right 09/2013   also with left salpingectomy   LEFT HEART CATH AND CORONARY ANGIOGRAPHY N/A 03/28/2020   Procedure: LEFT HEART CATH AND CORONARY ANGIOGRAPHY;  Surgeon: Iran Ouch, MD;  Location: [facility];  Service: Cardiovascular;  Laterality: N/A;   MAXILLARY ANTROSTOMY Right 07/19/2021   Procedure: MAXILLARY ANTROSTOMY WITH REMOVAL TISSUE;  Surgeon: Vernie Murders, MD;  Location: [facility];  Service: ENT;  Laterality: Right;   MINOR HEMORRHOIDECTOMY     NASAL TURBINATE REDUCTION Bilateral 07/19/2021   Procedure: INFERIOR TURBINATE REDUCTION;  Surgeon: Vernie Murders, MD;  Location: [facility];  Service: ENT;  Laterality: Bilateral;   PULMONARY THROMBECTOMY Bilateral 10/03/2022   Procedure: PULMONARY THROMBECTOMY;  Surgeon: Annice Needy, MD;  Location: [facility];  Service: Cardiovascular;  Laterality: Bilateral;   SEPTOPLASTY N/A 07/19/2021   Procedure: SEPTOPLASTY;  Surgeon: Vernie Murders, MD;  Location: [facility];  Service: ENT;  Laterality: N/A;   TONSILLECTOMY     TOTAL ABDOMINAL HYSTERECTOMY     TRACHELECTOMY N/A 11/15/2014   Procedure: TRACHELECTOMY;  Surgeon: Hildred Laser, MD;  Location: [facility];  Service: Gynecology;  Laterality: N/A;   TRIGGER FINGER RELEASE Right    TUBAL LIGATION Bilateral     Social History   Socioeconomic History   Marital status: Married    Spouse name: Jimmy   Number of children: 2   Years of education: Not on file   Highest education level: Not on file  Occupational History   Not on file  Tobacco Use   Smoking status: Never   Smokeless tobacco: Never  Vaping Use   Vaping status: Never Used  Substance and Sexual Activity   Alcohol use: Yes    Alcohol/week: 0.0 standard drinks of alcohol    Comment: occassionally    Drug use: No   Sexual activity: Not Currently    Birth control/protection: Surgical  Other Topics Concern   Not on file  Social History Narrative   Not on file   Social Determinants of Health   Financial Resource Strain: Low Risk  (01/08/2022)   Received from [facility], [facility]   Overall Financial Resource Strain (CARDIA)    Difficulty of Paying Living Expenses: Not hard at all  Food Insecurity: No Food Insecurity (11/02/2022)   Hunger Vital Sign    Worried About Running Out of Food in the Last Year: Never true    Ran Out of Food in the Last Year: Never true  Transportation Needs: No Transportation Needs (11/02/2022)   PRAPARE - Administrator, Civil Service (Medical): No    Lack of Transportation (Non-Medical): No  Physical Activity: Not on file  Stress: Not on file  Social Connections: Not on file  Intimate Partner Violence: Not At Risk (11/02/2022)   Humiliation,  Afraid, Rape, and Kick questionnaire    Fear of Current or Ex-Partner: No    Emotionally Abused: No    Physically Abused: No    Sexually Abused: No     Family History  Problem Relation Age of Onset   Hypertension Mother    Hyperlipidemia Mother    Stroke Mother    Colon cancer Mother    Breast cancer Sister 53   Ovarian cancer Sister    Throat cancer Brother      Current Outpatient Medications:    Acetaminophen (TYLENOL ARTHRITIS PAIN PO), Take 1,300 mg by mouth every 6 (six) hours as needed (Pain)., Disp: , Rfl:    apixaban (ELIQUIS) 5 MG TABS tablet, Take 5 mg by mouth 2 (two) times daily., Disp: , Rfl:    cyclobenzaprine (FLEXERIL) 10 MG tablet, Take 1 tablet (10 mg total) by mouth 3 (three) times daily as needed for muscle spasms., Disp: , Rfl:    ENTRESTO 24-26 MG, Take 1 tablet by mouth 2 (two) times daily., Disp: , Rfl:    esomeprazole (NEXIUM) 40 MG capsule, Take 40 mg by mouth every morning. , Disp: , Rfl:    furosemide (LASIX) 20 MG tablet,  Take 20 mg by mouth daily., Disp: , Rfl:    gabapentin (NEURONTIN) 100 MG capsule, Take 100 mg by mouth 3 (three) times daily., Disp: , Rfl:    levothyroxine (SYNTHROID) 125 MCG tablet, Take 125 mcg by mouth every morning., Disp: , Rfl:    Magnesium 250 MG TABS, Take 1 tablet by mouth daily., Disp: , Rfl:    metoprolol succinate (TOPROL-XL) 50 MG 24 hr tablet, TAKE 1 TABLET BY MOUTH ONCE DAILY WITH  OR  IMMEDIATELY  FOLLOWING  A  MEAL, Disp: 90 tablet, Rfl: 0   oxyCODONE-acetaminophen (PERCOCET/ROXICET) 5-325 MG tablet, Take 1 tablet by mouth every 4 (four) hours as needed for severe pain., Disp: , Rfl:    Semaglutide-Weight Management 0.25 MG/0.5ML SOAJ, Inject 0.25 mg into the skin once a week. For 4 weeks, Disp: 2 mL, Rfl: 0   [START ON 11/28/2022] Semaglutide-Weight Management 0.5 MG/0.5ML SOAJ, Inject 0.5 mg into the skin once a week for 28 days., Disp: 2 mL, Rfl: 0   [START ON 12/27/2022] Semaglutide-Weight Management 1 MG/0.5ML SOAJ, Inject 1 mg into the skin once a week for 28 days., Disp: 2 mL, Rfl: 0   [START ON 01/25/2023] Semaglutide-Weight Management 1.7 MG/0.75ML SOAJ, Inject 1.7 mg into the skin once a week for 28 days., Disp: 3 mL, Rfl: 0   [START ON 02/23/2023] Semaglutide-Weight Management 2.4 MG/0.75ML SOAJ, Inject 2.4 mg into the skin once a week for 28 days., Disp: 3 mL, Rfl: 0   sertraline (ZOLOFT) 100 MG tablet, Take 200 mg by mouth daily., Disp: , Rfl:    spironolactone (ALDACTONE) 25 MG tablet, Take 1/2 (one-half) tablet by mouth once daily, Disp: 45 tablet, Rfl: 3   fluocinonide-emollient (LIDEX-E) 0.05 % cream, Apply 1 Application topically 2 (two) times daily. (Patient not taking: Reported on 10/30/2022), Disp: 60 g, Rfl: 0   Physical exam:  Vitals:   11/02/22 1444  BP: 105/69  Pulse: 79  Resp: 18  Temp: (!) 96.2 F (35.7 C)  TempSrc: Tympanic  SpO2: 99%  Weight: 252 lb (114.3 kg)  Height: 5\' 7"  (1.702 m)   Physical Exam Cardiovascular:     Rate and Rhythm:  Normal rate and regular rhythm.     Heart sounds: Normal heart sounds.  Pulmonary:  Effort: Pulmonary effort is normal.     Breath sounds: Normal breath sounds.  Abdominal:     General: Bowel sounds are normal.     Palpations: Abdomen is soft.  Skin:    General: Skin is warm and dry.  Neurological:     Mental Status: She is alert and oriented to person, place, and time.           Latest Ref Rng & Units 10/10/2022   10:14 AM  CMP  Glucose 70 - 99 mg/dL 952   BUN 6 - 20 mg/dL 14   Creatinine 8.41 - 1.00 mg/dL 3.24   Sodium 401 - 027 mmol/L 137   Potassium 3.5 - 5.1 mmol/L 4.2   Chloride 98 - 111 mmol/L 106   CO2 22 - 32 mmol/L 23   Calcium 8.9 - 10.3 mg/dL 9.1       Latest Ref Rng & Units 10/10/2022   10:14 AM  CBC  WBC 4.0 - 10.5 K/uL 5.4   Hemoglobin 12.0 - 15.0 g/dL 25.3   Hematocrit 66.4 - 46.0 % 39.7   Platelets 150 - 400 K/uL 173     No images are attached to the encounter.  DG Chest 2 View  Result Date: 10/10/2022 CLINICAL DATA:  Chest pain EXAM: CHEST - 2 VIEW COMPARISON:  Chest x-ray dated October 02, 2022 FINDINGS: The heart size and mediastinal contours are within normal limits. Unchanged left chest wall single lead ICD. Both lungs are clear. The visualized skeletal structures are unremarkable. IMPRESSION: No active cardiopulmonary disease. Electronically Signed   By: Allegra Lai M.D.   On: 10/10/2022 11:22    Assessment and plan- Patient is a 62 y.o. female with history of recurrent pulmonary embolism currently on Eliquis referred for further management  On review of outside records patient had a cephalic vein and brachial vein thrombosis as well as subsegmental pulmonary emboli in July 2023 in the setting of IV line use.  She was briefly on anticoagulation for 6 months which was subsequently discontinued.  Patient now presents with a second episode of superficial venous thrombosis noted in her left lower extremity extending to the saphenofemoral  junction which ultimately also caused bilateral acute pulmonary embolus causing right heart strain.  Patient will therefore need to continue on Eliquis lifelong unless there are any concerns for acute bleeding issues in the future.  Patient is s/p thrombectomy after recent episode of pulmonary embolism.  I would like to do a hypercoagulable workup at this time including factor V Leiden, prothrombin gene mutation, testing for antiphospholipid antibody syndrome, protein C protein S testing as well as Antithrombin III levels.  In person or video visit with me in 2 weeks time   Thank you for this kind referral and the opportunity to participate in the care of this  Patient   Visit Diagnosis 1. Other acute pulmonary embolism with acute cor pulmonale (HCC)     Dr. Owens Shark, MD, MPH [facility] [phone] 11/04/2022

## 2022-11-05 ENCOUNTER — Other Ambulatory Visit

## 2022-11-05 ENCOUNTER — Encounter: Admitting: Oncology

## 2022-11-05 LAB — PROTEIN S PANEL
Protein S Activity: 60 % — ABNORMAL LOW (ref 63–140)
Protein S Ag, Free: 94 % (ref 61–136)
Protein S Ag, Total: 87 % (ref 60–150)

## 2022-11-05 LAB — LUPUS ANTICOAGULANT
DRVVT: 57.3 s — ABNORMAL HIGH (ref 0.0–47.0)
PTT Lupus Anticoagulant: 36 s (ref 0.0–43.5)
Thrombin Time: 20.4 s (ref 0.0–23.0)
dPT Confirm Ratio: 0.97 {ratio} (ref 0.00–1.34)
dPT: 46.5 s (ref 0.0–47.6)

## 2022-11-05 LAB — HEX PHASE PHOSPHOLIPID REFLEX

## 2022-11-05 LAB — HEXAGONAL PHASE PHOSPHOLIPID: Hex Phosph Neut Test: 4 s (ref 0–11)

## 2022-11-05 LAB — DRVVT MIX: dRVVT Mix: 46.6 s — ABNORMAL HIGH (ref 0.0–40.4)

## 2022-11-05 LAB — DRVVT CONFIRM: dRVVT Confirm: 0.9 {ratio} (ref 0.8–1.2)

## 2022-11-08 ENCOUNTER — Encounter: Admitting: Internal Medicine

## 2022-11-08 LAB — PROTHROMBIN GENE MUTATION

## 2022-11-13 LAB — FACTOR 5 LEIDEN

## 2022-11-15 DIAGNOSIS — M533 Sacrococcygeal disorders, not elsewhere classified: Secondary | ICD-10-CM | POA: Diagnosis not present

## 2022-11-16 ENCOUNTER — Encounter: Payer: Self-pay | Admitting: Oncology

## 2022-11-16 ENCOUNTER — Inpatient Hospital Stay (HOSPITAL_BASED_OUTPATIENT_CLINIC_OR_DEPARTMENT_OTHER): Payer: Medicare HMO | Admitting: Oncology

## 2022-11-16 VITALS — BP 125/83 | HR 78 | Temp 98.0°F | Resp 18 | Wt 255.3 lb

## 2022-11-16 DIAGNOSIS — I2699 Other pulmonary embolism without acute cor pulmonale: Secondary | ICD-10-CM | POA: Diagnosis not present

## 2022-11-16 DIAGNOSIS — I2609 Other pulmonary embolism with acute cor pulmonale: Secondary | ICD-10-CM

## 2022-11-16 DIAGNOSIS — Z7901 Long term (current) use of anticoagulants: Secondary | ICD-10-CM | POA: Diagnosis not present

## 2022-11-16 NOTE — Progress Notes (Signed)
Hematology/Oncology Consult note [facility]  Telephone:([phone] [phone]  Patient Care Team: Jerl Mina, MD as PCP - General (Family Medicine) Duke Salvia, MD as PCP - Electrophysiology (Cardiology) Debbe Odea, MD as PCP - Cardiology (Cardiology) Sherrie Mustache, MD (Internal Medicine) Kieth Brightly, MD (General Surgery) Iran Ouch, MD as Consulting Physician (Cardiology)   Name of the patient: Lisa Fuentes  [id number]  March 17, 1960   Date of visit: 11/16/22  Diagnosis-history of recurrent thromboembolism  Chief complaint/ Reason for visit-discuss results of hypercoagulable workup  Heme/Onc history: Patient is a 62 year old female who presented with symptoms of left lower extremity pain and swelling in September 2024. Bilateral lower extremity ultrasound showed no evidence of DVT in the right lower extremity. There was a superficial venous thrombus present in the great saphenous vein almost extending to the saphenofemoral junction. She underwent CT angiogram to rule out PE given symptoms of shortness of breath and was found to have multiple occlusive and nonocclusive filling defects in the lobar segmental and subsegmental pulmonary artery branches with concern for right heart strain. She was also found to have a mildly reduced left ventricular function globally with an EF of 44% with a moderate-sized area of septal basal fixed defect. Patient underwent thrombectomy by vascular surgery and was started on Eliquis. Patient has had evidence of brachial vein thrombosis in July 2023 in her right arm which was noticed after she had an IV line placement. This was present in the proximal mid upper arm, basal insulin in the mid upper arm proximal forearm and cephalic vein in the forearm as well as distal forearm. Back then she was on anticoagulation for about 6 months and stopped she was also found to have left lower lobe segmental  and subsegmental pulmonary emboli at that time.   Results of hypercoagulable workup including factor V Leiden, prothrombin gene mutation, protein C, protein S S, Antithrombin III levels normal.  Testing for antiphospholipid antibody syndrome unremarkable  Interval history-tolerating Eliquis well without any significant side effects  ECOG PS- 0 Pain scale- 0   Review of systems- Review of Systems  Constitutional:  Negative for chills, fever, malaise/fatigue and weight loss.  HENT:  Negative for congestion, ear discharge and nosebleeds.   Eyes:  Negative for blurred vision.  Respiratory:  Negative for cough, hemoptysis, sputum production, shortness of breath and wheezing.   Cardiovascular:  Negative for chest pain, palpitations, orthopnea and claudication.  Gastrointestinal:  Negative for abdominal pain, blood in stool, constipation, diarrhea, heartburn, melena, nausea and vomiting.  Genitourinary:  Negative for dysuria, flank pain, frequency, hematuria and urgency.  Musculoskeletal:  Negative for back pain, joint pain and myalgias.  Skin:  Negative for rash.  Neurological:  Negative for dizziness, tingling, focal weakness, seizures, weakness and headaches.  Endo/Heme/Allergies:  Does not bruise/bleed easily.  Psychiatric/Behavioral:  Negative for depression and suicidal ideas. The patient does not have insomnia.       Allergies  Allergen Reactions   Sulfa Antibiotics Hives   Sulfamethoxazole-Trimethoprim Hives   Jardiance [Empagliflozin] Rash    Yeast rash     Past Medical History:  Diagnosis Date   AICD (automatic cardioverter/defibrillator) present 2008   a.) Medtronic device placed 2008. b.) replaced 06/21/2013 (Medtronic Evera XT single-chamber ICD; model number [id number]).   Anemia    Anxiety    Aortic dilatation (HCC) 03/24/2019   a.) TTE 03/24/2019: Ao root measured 37 mm. b.) TTE 03/21/2020: Ao root measured 37 mm.  Arthritis    Depression    Difficult intubation     DOE (dyspnea on exertion)    Endometriosis of vagina 09/2013   Intra-operative findings of endometriosis implants on cervical stump   Essential hypertension    GERD (gastroesophageal reflux disease)    Headache    HFrEF (heart failure with reduced ejection fraction) (HCC)    a.) TTE 02/18/2014: EF 40-45%; tiv AR, mild MR/TR/PR; G1DD. b.) TTE 02/22/2017: EF 45-50%; mild LVH; triv PR; G1DD. c.) TTE 03/24/2019: EF 45-50%; glob HK, mild LVH; triv MR/TR/PR. d.) TTE 03/21/2020: EF 40-45%; glob HK.   History of 2019 novel coronavirus disease (COVID-19) 02/02/2020   History of 2019 novel coronavirus disease (COVID-19) 02/02/2020   Hypokalemia    Hypothyroidism    a.) s/p radioactive iodine Tx; on levothyroxine   Mixed hyperlipidemia    Nonischemic cardiomyopathy (HCC)    a.) TTE 02/18/2014: EF 40-45%. b.) TTE 02/22/2017: EF 45-50%. c.) TTE 03/24/2019: EF 45-50%. d.) TTE 03/21/2020: EF 40-45%   OSA on CPAP    Panic attacks    PSVT (paroxysmal supraventricular tachycardia) (HCC) 03/30/2020   a.) Holter 03/30/2020 --> 2 runs; fastest lasting 4 beats (184 bpm); longest lasting 4 beats (113 bpm)   Tachycardia      Past Surgical History:  Procedure Laterality Date   ANTERIOR AND POSTERIOR REPAIR N/A 11/25/2017   Procedure: ANTERIOR (CYSTOCELE) AND POSTERIOR REPAIR (RECTOCELE);  Surgeon: Hildred Laser, MD;  Location: [facility] ORS;  Service: Gynecology;  Laterality: N/A;   BREAST CYST ASPIRATION Left 03/22/2014   neg/ done by Dr Evette Cristal   CARDIAC CATHETERIZATION  2003   [facility]: No significant coronary artery disease with reduced ejection fraction.   CARDIAC DEFIBRILLATOR PLACEMENT  04/2006   replaced 05/2013   CARDIAC DEFIBRILLATOR PLACEMENT  06/21/2013   Procedure:  CARDIAC DEFIBRILLATOR PLACEMENT (Medtronic single-chamber ICD, model number Lianne Moris, model number [id number]): Location: [facility]; Surgeon: Gerre Pebbles, MD   CARPAL TUNNEL RELEASE Right    COLONOSCOPY  2015   COLONOSCOPY N/A  10/13/2020   Procedure: COLONOSCOPY;  Surgeon: Jaynie Collins, DO;  Location: [facility] ENDOSCOPY;  Service: Gastroenterology;  Laterality: N/A;   COLONOSCOPY WITH PROPOFOL N/A 10/04/2015   Procedure: COLONOSCOPY WITH PROPOFOL;  Surgeon: Midge Minium, MD;  Location: [facility] ENDOSCOPY;  Service: Endoscopy;  Laterality: N/A;   CORONARY ANGIOPLASTY     CYSTOSCOPY  11/15/2014   Procedure: CYSTOSCOPY;  Surgeon: Hildred Laser, MD;  Location: [facility] ORS;  Service: Gynecology;;   DIAGNOSTIC LAPAROSCOPY     ETHMOIDECTOMY Right 07/19/2021   Procedure: TOTAL ETHMOIDECTOMY WITH FRONTAL SINUS EXPLORATION;  Surgeon: Vernie Murders, MD;  Location: [facility] ORS;  Service: ENT;  Laterality: Right;   FOOT SURGERY Bilateral    heer spur and bunion   IMAGE GUIDED SINUS SURGERY N/A 07/19/2021   Procedure: IMAGE GUIDED SINUS SURGERY;  Surgeon: Vernie Murders, MD;  Location: [facility] ORS;  Service: ENT;  Laterality: N/A;   KNEE ARTHROSCOPY Left 03/02/2015   Procedure: ARTHROSCOPY  LEFT KNEE, PARTIAL LATERAL  MENISECTOMY, SYNOVECTOMY, MEDIAL & LATERAL CHONDROPLASTY;  Surgeon: Juanell Fairly, MD;  Location: [facility] ORS;  Service: Orthopedics;  Laterality: Left;   LAPAROSCOPIC SALPINGO OOPHERECTOMY Left 11/15/2014   Procedure: LAPAROSCOPIC OOPHORECTOMY;  Surgeon: Hildred Laser, MD;  Location: [facility] ORS;  Service: Gynecology;  Laterality: Left;   LAPAROSCOPIC SALPINGOOPHERECTOMY Right 09/2013   also with left salpingectomy   LEFT HEART CATH AND CORONARY ANGIOGRAPHY N/A 03/28/2020   Procedure: LEFT HEART CATH AND CORONARY ANGIOGRAPHY;  Surgeon: Kirke Corin,  Chelsea Aus, MD;  Location: [facility] INVASIVE [facility];  Service: Cardiovascular;  Laterality: N/A;   MAXILLARY ANTROSTOMY Right 07/19/2021   Procedure: MAXILLARY ANTROSTOMY WITH REMOVAL TISSUE;  Surgeon: Vernie Murders, MD;  Location: [facility] OR[facility];  Service: ENT;  Laterality: Right;   MINOR HEMORRHOIDECTOMY     NASAL TURBINATE REDUCTION Bilateral 07/19/2021   Procedure: INFERIOR TURBINATE REDUCTION;   Surgeon: Vernie Murders, MD;  Location: [facility] OR[facility];  Service: ENT;  Laterality: Bilateral;   PULMONARY THROMBECTOMY Bilateral 10/03/2022   Procedure: PULMONARY THROMBECTOMY;  Surgeon: Annice Needy, MD;  Location: [facility]IVE CV LAB;  Service: Cardiovascular;  Laterality: Bilateral;   SEPTOPLASTY N/A 07/19/2021   Procedure: SEPTOPLASTY;  Surgeon: Vernie Murders, MD;  Location: [facility] OR[facility];  Service: ENT;  Laterality: N/A;   TONSILLECTOMY     TOTAL ABDOMINAL HYSTERECTOMY     TRACHELECTOMY N/A 11/15/2014   Procedure: TRACHELECTOMY;  Surgeon: Hildred Laser, MD;  Location: [facility] ORS;  Service: Gynecology;  Laterality: N/A;   TRIGGER FINGER RELEASE Right    TUBAL LIGATION Bilateral     Social History   Socioeconomic History   Marital status: Married    Spouse name: Jimmy   Number of children: 2   Years of education: Not on file   Highest education level: Not on file  Occupational History   Not on file  Tobacco Use   Smoking status: Never   Smokeless tobacco: Never  Vaping Use   Vaping status: Never Used  Substance and Sexual Activity   Alcohol use: Yes    Alcohol/week: 0.0 standard drinks of alcohol    Comment: occassionally   Drug use: No   Sexual activity: Not Currently    Birth control/protection: Surgical  Other Topics Concern   Not on file  Social History Narrative   Not on file   Social Determinants of Health   Financial Resource Strain: Low Risk  (01/08/2022)   Received from [facility], [facility]   Overall Financial Resource Strain (CARDIA)    Difficulty of Paying Living Expenses: Not hard at all  Food Insecurity: No Food Insecurity (11/02/2022)   Hunger Vital Sign    Worried About Running Out of Food in the Last Year: Never true    Ran Out of Food in the Last Year: Never true  Transportation Needs: No Transportation Needs (11/02/2022)   PRAPARE - Administrator, Civil Service (Medical): No    Lack of Transportation  (Non-Medical): No  Physical Activity: Not on file  Stress: Not on file  Social Connections: Not on file  Intimate Partner Violence: Not At Risk (11/02/2022)   Humiliation, Afraid, Rape, and Kick questionnaire    Fear of Current or Ex-Partner: No    Emotionally Abused: No    Physically Abused: No    Sexually Abused: No    Family History  Problem Relation Age of Onset   Hypertension Mother    Hyperlipidemia Mother    Stroke Mother    Colon cancer Mother    Breast cancer Sister 66   Ovarian cancer Sister    Throat cancer Brother      Current Outpatient Medications:    Acetaminophen (TYLENOL ARTHRITIS PAIN PO), Take 1,300 mg by mouth every 6 (six) hours as needed (Pain)., Disp: , Rfl:    apixaban (ELIQUIS) 5 MG TABS tablet, Take 5 mg by mouth 2 (two) times daily., Disp: , Rfl:    cyclobenzaprine (FLEXERIL) 10 MG tablet, Take 1 tablet (10  mg total) by mouth 3 (three) times daily as needed for muscle spasms., Disp: , Rfl:    ENTRESTO 24-26 MG, Take 1 tablet by mouth 2 (two) times daily., Disp: , Rfl:    esomeprazole (NEXIUM) 40 MG capsule, Take 40 mg by mouth every morning. , Disp: , Rfl:    fluocinonide-emollient (LIDEX-E) 0.05 % cream, Apply 1 Application topically 2 (two) times daily. (Patient not taking: Reported on 10/30/2022), Disp: 60 g, Rfl: 0   furosemide (LASIX) 20 MG tablet, Take 20 mg by mouth daily., Disp: , Rfl:    gabapentin (NEURONTIN) 100 MG capsule, Take 100 mg by mouth 3 (three) times daily., Disp: , Rfl:    levothyroxine (SYNTHROID) 125 MCG tablet, Take 125 mcg by mouth every morning., Disp: , Rfl:    Magnesium 250 MG TABS, Take 1 tablet by mouth daily., Disp: , Rfl:    metoprolol succinate (TOPROL-XL) 50 MG 24 hr tablet, TAKE 1 TABLET BY MOUTH ONCE DAILY WITH  OR  IMMEDIATELY  FOLLOWING  A  MEAL, Disp: 90 tablet, Rfl: 0   oxyCODONE-acetaminophen (PERCOCET/ROXICET) 5-325 MG tablet, Take 1 tablet by mouth every 4 (four) hours as needed for severe pain., Disp: , Rfl:     Semaglutide-Weight Management 0.25 MG/0.5ML SOAJ, Inject 0.25 mg into the skin once a week. For 4 weeks, Disp: 2 mL, Rfl: 0   [START ON 11/28/2022] Semaglutide-Weight Management 0.5 MG/0.5ML SOAJ, Inject 0.5 mg into the skin once a week for 28 days., Disp: 2 mL, Rfl: 0   [START ON 12/27/2022] Semaglutide-Weight Management 1 MG/0.5ML SOAJ, Inject 1 mg into the skin once a week for 28 days., Disp: 2 mL, Rfl: 0   [START ON 01/25/2023] Semaglutide-Weight Management 1.7 MG/0.75ML SOAJ, Inject 1.7 mg into the skin once a week for 28 days., Disp: 3 mL, Rfl: 0   [START ON 02/23/2023] Semaglutide-Weight Management 2.4 MG/0.75ML SOAJ, Inject 2.4 mg into the skin once a week for 28 days., Disp: 3 mL, Rfl: 0   sertraline (ZOLOFT) 100 MG tablet, Take 200 mg by mouth daily., Disp: , Rfl:    spironolactone (ALDACTONE) 25 MG tablet, Take 1/2 (one-half) tablet by mouth once daily, Disp: 45 tablet, Rfl: 3  Physical exam:  Vitals:   11/16/22 1517  BP: 125/83  Pulse: 78  Resp: 18  Temp: 98 F (36.7 C)  TempSrc: Tympanic  SpO2: 98%  Weight: 255 lb 4.8 oz (115.8 kg)   Physical Exam Cardiovascular:     Rate and Rhythm: Normal rate and regular rhythm.     Heart sounds: Normal heart sounds.  Pulmonary:     Effort: Pulmonary effort is normal.     Breath sounds: Normal breath sounds.  Abdominal:     General: Bowel sounds are normal.     Palpations: Abdomen is soft.  Skin:    General: Skin is warm and dry.  Neurological:     Mental Status: She is alert and oriented to person, place, and time.         Latest Ref Rng & Units 10/10/2022   10:14 AM  CMP  Glucose 70 - 99 mg/dL 098   BUN 6 - 20 mg/dL 14   Creatinine 1.19 - 1.00 mg/dL 1.47   Sodium 829 - 562 mmol/L 137   Potassium 3.5 - 5.1 mmol/L 4.2   Chloride 98 - 111 mmol/L 106   CO2 22 - 32 mmol/L 23   Calcium 8.9 - 10.3 mg/dL 9.1  Latest Ref Rng & Units 10/10/2022   10:14 AM  CBC  WBC 4.0 - 10.5 K/uL 5.4   Hemoglobin 12.0 - 15.0 g/dL 16.1    Hematocrit 09.6 - 46.0 % 39.7   Platelets 150 - 400 K/uL 173      Assessment and plan- Patient is a 62 y.o. female with history of recurrent pulmonary embolism currently on Eliquis. This is a visit to discuss hypercoagulablle work up  Discussed the results of hypercoagulable workup which did not show any obvious cause.  Given that she has had recurrent episodes of pulmonary embolism I would recommend staying on Eliquis indefinitely at this time.  If she has any symptoms of bleeding we could consider stopping it down the line.  After 1 year I will reduce the dose of Eliquis to 2.5 mg twice daily.  I will see her back in 1 year with labs.   Visit Diagnosis 1. Other acute pulmonary embolism with acute cor pulmonale (HCC)      Dr. Owens Shark, MD, MPH [facility] [place] [facility] [phone] 11/16/2022 3:48 PM

## 2022-11-19 ENCOUNTER — Ambulatory Visit: Payer: Medicare HMO | Attending: Internal Medicine | Admitting: Cardiology

## 2022-11-19 ENCOUNTER — Encounter: Payer: Self-pay | Admitting: Cardiology

## 2022-11-19 ENCOUNTER — Telehealth: Payer: Self-pay | Admitting: Cardiology

## 2022-11-19 VITALS — BP 118/81 | HR 81 | Ht 67.0 in | Wt 256.0 lb

## 2022-11-19 DIAGNOSIS — I5042 Chronic combined systolic (congestive) and diastolic (congestive) heart failure: Secondary | ICD-10-CM

## 2022-11-19 DIAGNOSIS — I10 Essential (primary) hypertension: Secondary | ICD-10-CM

## 2022-11-19 DIAGNOSIS — G4733 Obstructive sleep apnea (adult) (pediatric): Secondary | ICD-10-CM

## 2022-11-19 DIAGNOSIS — I2699 Other pulmonary embolism without acute cor pulmonale: Secondary | ICD-10-CM | POA: Diagnosis not present

## 2022-11-19 MED ORDER — SPIRONOLACTONE 25 MG PO TABS: 25.0000 mg | ORAL_TABLET | Freq: Every day | ORAL | 3 refills | Status: DC

## 2022-11-19 NOTE — Progress Notes (Signed)
ADVANCED HF CLINIC CONSULT NOTE  Referring Physician: Jerl Mina, MD Primary Care: Jerl Mina, MD Primary Cardiologist: Debbe Odea, MD  HPI:  Lisa Fuentes is a 62 y.o. female with a hx of systolic HF due to NICM, VT s/p MDT ICD morbid obesity, hypertension, PE s/p endovascular thrombolysis 9/24 on Eliquis and OSA referred by Dr. Azucena Cecil for further evaluation of her HF.    Diagnosed with systolic HF in 2000. Cath 2003 no CAD. Had repeat cath 2022 with normal cors.   Had RUE DVT 6/23 after trauma. Upper extremity u/s with multiple RUE DVTs. CTA chest LLL segmental and subsegmental pulmonary emboli without evidence of RV strain. Started on Eliquis. She completed several months of Eliquis, and it was then discontinued per hematology.    Admitted [facility] 10/01/2022. CTA chest with moderate volume of acute PE with RV strain. She was started on heparin. She underwent pulmonary thrombectomy. D/c'd on Eliquis. Nuclear stress test on 10/03/2022,  LVEF 44% with moderate fixed basal septal defect without ischemia. Echo 10/03/22 EF 30-35% mild TR   Interval hx:  - Reports that her biggest limitation right now is low back pain. Otherwise, she is can perform all ADLs without difficulty; minimal lightheadedness. Reports that she can go to [facility] and complete a grocery strip without needing to stop for shortness of breath.  - Only symptoms overtly related to her underlying HFrEF currently is bendopnea.     Past Medical History:  Diagnosis Date   AICD (automatic cardioverter/defibrillator) present 2008   a.) Medtronic device placed 2008. b.) replaced 06/21/2013 (Medtronic Evera XT single-chamber ICD; model number [id number]).   Anemia    Anxiety    Aortic dilatation (HCC) 03/24/2019   a.) TTE 03/24/2019: Ao root measured 37 mm. b.) TTE 03/21/2020: Ao root measured 37 mm.   Arthritis    Depression    Difficult intubation    DOE (dyspnea on exertion)    Endometriosis of vagina 09/2013    Intra-operative findings of endometriosis implants on cervical stump   Essential hypertension    GERD (gastroesophageal reflux disease)    Headache    HFrEF (heart failure with reduced ejection fraction) (HCC)    a.) TTE 02/18/2014: EF 40-45%; tiv AR, mild MR/TR/PR; G1DD. b.) TTE 02/22/2017: EF 45-50%; mild LVH; triv PR; G1DD. c.) TTE 03/24/2019: EF 45-50%; glob HK, mild LVH; triv MR/TR/PR. d.) TTE 03/21/2020: EF 40-45%; glob HK.   History of 2019 novel coronavirus disease (COVID-19) 02/02/2020   History of 2019 novel coronavirus disease (COVID-19) 02/02/2020   Hypokalemia    Hypothyroidism    a.) s/p radioactive iodine Tx; on levothyroxine   Mixed hyperlipidemia    Nonischemic cardiomyopathy (HCC)    a.) TTE 02/18/2014: EF 40-45%. b.) TTE 02/22/2017: EF 45-50%. c.) TTE 03/24/2019: EF 45-50%. d.) TTE 03/21/2020: EF 40-45%   OSA on CPAP    Panic attacks    PSVT (paroxysmal supraventricular tachycardia) (HCC) 03/30/2020   a.) Holter 03/30/2020 --> 2 runs; fastest lasting 4 beats (184 bpm); longest lasting 4 beats (113 bpm)   Tachycardia     Current Outpatient Medications  Medication Sig Dispense Refill   Acetaminophen (TYLENOL ARTHRITIS PAIN PO) Take 1,300 mg by mouth every 6 (six) hours as needed (Pain).     apixaban (ELIQUIS) 5 MG TABS tablet Take 5 mg by mouth 2 (two) times daily.     cyclobenzaprine (FLEXERIL) 10 MG tablet Take 1 tablet (10 mg total) by mouth 3 (three) times daily as needed  for muscle spasms.     ENTRESTO 24-26 MG Take 1 tablet by mouth 2 (two) times daily.     esomeprazole (NEXIUM) 40 MG capsule Take 40 mg by mouth every morning.      fluocinonide-emollient (LIDEX-E) 0.05 % cream Apply 1 Application topically 2 (two) times daily. (Patient not taking: Reported on 10/30/2022) 60 g 0   furosemide (LASIX) 20 MG tablet Take 20 mg by mouth daily.     gabapentin (NEURONTIN) 100 MG capsule Take 100 mg by mouth 3 (three) times daily.     levothyroxine (SYNTHROID) 125 MCG  tablet Take 125 mcg by mouth every morning.     Magnesium 250 MG TABS Take 1 tablet by mouth daily.     metoprolol succinate (TOPROL-XL) 50 MG 24 hr tablet TAKE 1 TABLET BY MOUTH ONCE DAILY WITH  OR  IMMEDIATELY  FOLLOWING  A  MEAL 90 tablet 0   oxyCODONE-acetaminophen (PERCOCET/ROXICET) 5-325 MG tablet Take 1 tablet by mouth every 4 (four) hours as needed for severe pain.     Semaglutide-Weight Management 0.25 MG/0.5ML SOAJ Inject 0.25 mg into the skin once a week. For 4 weeks 2 mL 0   [START ON 11/28/2022] Semaglutide-Weight Management 0.5 MG/0.5ML SOAJ Inject 0.5 mg into the skin once a week for 28 days. 2 mL 0   [START ON 12/27/2022] Semaglutide-Weight Management 1 MG/0.5ML SOAJ Inject 1 mg into the skin once a week for 28 days. 2 mL 0   [START ON 01/25/2023] Semaglutide-Weight Management 1.7 MG/0.75ML SOAJ Inject 1.7 mg into the skin once a week for 28 days. 3 mL 0   [START ON 02/23/2023] Semaglutide-Weight Management 2.4 MG/0.75ML SOAJ Inject 2.4 mg into the skin once a week for 28 days. 3 mL 0   sertraline (ZOLOFT) 100 MG tablet Take 200 mg by mouth daily.     spironolactone (ALDACTONE) 25 MG tablet Take 1/2 (one-half) tablet by mouth once daily 45 tablet 3   No current facility-administered medications for this visit.    Allergies  Allergen Reactions   Sulfa Antibiotics Hives   Sulfamethoxazole-Trimethoprim Hives   Jardiance [Empagliflozin] Rash    Yeast rash      Social History   Socioeconomic History   Marital status: Married    Spouse name: Jimmy   Number of children: 2   Years of education: Not on file   Highest education level: Not on file  Occupational History   Not on file  Tobacco Use   Smoking status: Never   Smokeless tobacco: Never  Vaping Use   Vaping status: Never Used  Substance and Sexual Activity   Alcohol use: Yes    Alcohol/week: 0.0 standard drinks of alcohol    Comment: occassionally   Drug use: No   Sexual activity: Not Currently    Birth  control/protection: Surgical  Other Topics Concern   Not on file  Social History Narrative   Not on file   Social Determinants of Health   Financial Resource Strain: Low Risk  (01/08/2022)   Received from [facility], [facility]   Overall Financial Resource Strain (CARDIA)    Difficulty of Paying Living Expenses: Not hard at all  Food Insecurity: No Food Insecurity (11/02/2022)   Hunger Vital Sign    Worried About Running Out of Food in the Last Year: Never true    Ran Out of Food in the Last Year: Never true  Transportation Needs: No Transportation Needs (11/02/2022)   PRAPARE -  Administrator, Civil Service (Medical): No    Lack of Transportation (Non-Medical): No  Physical Activity: Not on file  Stress: Not on file  Social Connections: Not on file  Intimate Partner Violence: Not At Risk (11/02/2022)   Humiliation, Afraid, Rape, and Kick questionnaire    Fear of Current or Ex-Partner: No    Emotionally Abused: No    Physically Abused: No    Sexually Abused: No      Family History  Problem Relation Age of Onset   Hypertension Mother    Hyperlipidemia Mother    Stroke Mother    Colon cancer Mother    Breast cancer Sister 57   Ovarian cancer Sister    Throat cancer Brother     There were no vitals filed for this visit.  PHYSICAL EXAM: General:  Well appearing. No respiratory difficulty HEENT: normal Neck: supple. no JVD. Carotids 2+ bilat; no bruits. No lymphadenopathy or thryomegaly appreciated. Cor: PMI nondisplaced. Regular rate & rhythm. No rubs, gallops or murmurs. Lungs: clear Abdomen: soft, nontender, nondistended. No hepatosplenomegaly. No bruits or masses. Good bowel sounds. Extremities: no cyanosis, clubbing, rash, edema Neuro: alert & oriented x 3, cranial nerves grossly intact. moves all 4 extremities w/o difficulty. Affect pleasant.  ASSESSMENT & PLAN:  1. Systolic HF due to NICM - cath 2003 and 2022 no  CAD - nuclear stress test on 10/03/2022 EF 44% with moderate fixed basal septal defect without evidence of ischemia.  - Echo 9/24 EF 30-35% with mild tricuspid regurgitation.  - NYHA IIB - Entresto 24/26mg  BID - will increase spironolactone to 25mg  daily - Metoprolol succinate 50mg  daily.  - will hold off on jardiance; recent yeast infection.  - Euvolemic on lasix 20mg  every other day.   2. RV Failure - TTE from 10/03/22 with at least moderately reduced RV function 2/2 acute PE; suspect this has improved after thrombectomy.  - Repeat TTE in 58m to reassess.   3. H/o VT - s/p MDT ICD  4. Recurrent DVT/PE - s/p thrombectomy - will need f/u echo in 3-6 months to assess for residual PH - plan for RHC if there are sign of RV dysfunction and/or PH after repeat TTE.  - continue lifelong Eliquis  5. Morbid obesity - Body mass index is 40.1 kg/m. - Continue GLP1RA ([place])  Lorene Samaan, DO  2:28 PM

## 2022-11-19 NOTE — Patient Instructions (Addendum)
Medication Changes:  Increase Spironolactone to 25 mg (1 tablet) daily.    Testing/Procedures:  Your physician has requested that you have an echocardiogram. Echocardiography is a painless test that uses sound waves to create images of your heart. It provides your doctor with information about the size and shape of your heart and how well your heart's chambers and valves are working. This procedure takes approximately one hour. There are no restrictions for this procedure. Please do NOT wear cologne, perfume, aftershave, or lotions (deodorant is allowed). Please arrive 15 minutes prior to your appointment time.   If you have not received a call to schedule your ECHO appointment please call [phone]   Special Instructions // Education:  Do the following things EVERYDAY: Weigh yourself in the morning before breakfast. Write it down and keep it in a log. Take your medicines as prescribed Eat low salt foods--Limit salt (sodium) to 2000 mg per day.  Stay as active as you can everyday Limit all fluids for the day to less than 2 liters   Follow-Up in: Please follow up in 1 month with Dr. Gasper Lloyd.     If you have any questions or concerns before your next appointment please send [place] a message through [facility] or call our office at [phone] Monday-Friday 8 am-5 pm.   If you have an urgent need after hours on the weekend please call your Primary Cardiologist or the [facility] in [place] at [phone].   At the [facility], you and your health needs are our priority. We have a designated team specialized in the treatment of Heart Failure. This Care Team includes your primary Heart Failure Specialized Cardiologist (physician), Advanced Practice Providers (APPs- Physician Assistants and Nurse Practitioners), and Pharmacist who all work together to provide you with the care you need, when you need it.   You may see any of the following providers  on your designated Care Team at your next follow up:  Dr. Arvilla Meres Dr. Marca Ancona Dr. Dorthula Nettles Dr. Theresia Bough Tonye Becket, NP Robbie Lis, [place] [address] [place], [place] Brynda Peon, NP [place] Lee, NP Clarisa Kindred, NP Enos Fling, PharmD

## 2022-11-19 NOTE — Telephone Encounter (Signed)
Patient dropped off note to nurse, [employer] requesting requesting a detailed summary so insurance will cover [phone]. Note placed in box.

## 2022-11-20 ENCOUNTER — Ambulatory Visit (INDEPENDENT_AMBULATORY_CARE_PROVIDER_SITE_OTHER): Payer: Medicare HMO | Admitting: Vascular Surgery

## 2022-11-20 ENCOUNTER — Encounter (INDEPENDENT_AMBULATORY_CARE_PROVIDER_SITE_OTHER): Payer: Self-pay | Admitting: Vascular Surgery

## 2022-11-20 VITALS — BP 121/81 | HR 72 | Resp 18 | Ht 67.0 in | Wt 255.2 lb

## 2022-11-20 DIAGNOSIS — I2609 Other pulmonary embolism with acute cor pulmonale: Secondary | ICD-10-CM

## 2022-11-20 DIAGNOSIS — M7989 Other specified soft tissue disorders: Secondary | ICD-10-CM | POA: Diagnosis not present

## 2022-11-20 DIAGNOSIS — I10 Essential (primary) hypertension: Secondary | ICD-10-CM | POA: Diagnosis not present

## 2022-11-20 DIAGNOSIS — E7800 Pure hypercholesterolemia, unspecified: Secondary | ICD-10-CM | POA: Diagnosis not present

## 2022-11-20 NOTE — Progress Notes (Unsigned)
Patient ID: Lisa Fuentes, female   DOB: 01-26-1961, 62 y.o.   MRN: [id number]  Chief Complaint  Patient presents with   New Patient (Initial Visit)    NP. consult. leg edema. hedric    HPI Lisa Fuentes is a 62 y.o. female.  Patient follows up about a month s/p pulmonary thrombectomy.  Her breathing is essentially at her baseline now.  She is doing quite well.  She had no periprocedural complications.  She is still having persistent leg edema which she had prior to this event but it seems to be worse now.  Both legs are swollen.  The swelling progresses throughout the day and has not really improved much.  The legs are heavy and tired easily but this is not overtly painful.   Past Medical History:  Diagnosis Date   AICD (automatic cardioverter/defibrillator) present 2008   a.) Medtronic device placed 2008. b.) replaced 06/21/2013 (Medtronic Evera XT single-chamber ICD; model number [id number]).   Anemia    Anxiety    Aortic dilatation (HCC) 03/24/2019   a.) TTE 03/24/2019: Ao root measured 37 mm. b.) TTE 03/21/2020: Ao root measured 37 mm.   Arthritis    Depression    Difficult intubation    DOE (dyspnea on exertion)    Endometriosis of vagina 09/2013   Intra-operative findings of endometriosis implants on cervical stump   Essential hypertension    GERD (gastroesophageal reflux disease)    Headache    HFrEF (heart failure with reduced ejection fraction) (HCC)    a.) TTE 02/18/2014: EF 40-45%; tiv AR, mild MR/TR/PR; G1DD. b.) TTE 02/22/2017: EF 45-50%; mild LVH; triv PR; G1DD. c.) TTE 03/24/2019: EF 45-50%; glob HK, mild LVH; triv MR/TR/PR. d.) TTE 03/21/2020: EF 40-45%; glob HK.   History of 2019 novel coronavirus disease (COVID-19) 02/02/2020   History of 2019 novel coronavirus disease (COVID-19) 02/02/2020   Hypokalemia    Hypothyroidism    a.) s/p radioactive iodine Tx; on levothyroxine   Mixed hyperlipidemia    Nonischemic cardiomyopathy (HCC)    a.) TTE 02/18/2014:  EF 40-45%. b.) TTE 02/22/2017: EF 45-50%. c.) TTE 03/24/2019: EF 45-50%. d.) TTE 03/21/2020: EF 40-45%   OSA on CPAP    Panic attacks    PSVT (paroxysmal supraventricular tachycardia) (HCC) 03/30/2020   a.) Holter 03/30/2020 --> 2 runs; fastest lasting 4 beats (184 bpm); longest lasting 4 beats (113 bpm)   Tachycardia     Past Surgical History:  Procedure Laterality Date   ANTERIOR AND POSTERIOR REPAIR N/A 11/25/2017   Procedure: ANTERIOR (CYSTOCELE) AND POSTERIOR REPAIR (RECTOCELE);  Surgeon: Hildred Laser, MD;  Location: [facility];  Service: Gynecology;  Laterality: N/A;   BREAST CYST ASPIRATION Left 03/22/2014   neg/ done by Dr Evette Cristal   CARDIAC CATHETERIZATION  2003   [facility]: No significant coronary artery disease with reduced ejection fraction.   CARDIAC DEFIBRILLATOR PLACEMENT  04/2006   replaced 05/2013   CARDIAC DEFIBRILLATOR PLACEMENT  06/21/2013   Procedure:  CARDIAC DEFIBRILLATOR PLACEMENT (Medtronic single-chamber ICD, model number Lianne Moris, model number [id number]): Location: [facility]; Surgeon: Gerre Pebbles, MD   CARPAL TUNNEL RELEASE Right    COLONOSCOPY  2015   COLONOSCOPY N/A 10/13/2020   Procedure: COLONOSCOPY;  Surgeon: Jaynie Collins, DO;  Location: [facility] ENDOSCOPY;  Service: Gastroenterology;  Laterality: N/A;   COLONOSCOPY WITH PROPOFOL N/A 10/04/2015   Procedure: COLONOSCOPY WITH PROPOFOL;  Surgeon: Midge Minium, MD;  Location: [facility] ENDOSCOPY;  Service: Endoscopy;  Laterality: N/A;  CORONARY ANGIOPLASTY     CYSTOSCOPY  11/15/2014   Procedure: CYSTOSCOPY;  Surgeon: Hildred Laser, MD;  Location: [facility];  Service: Gynecology;;   DIAGNOSTIC LAPAROSCOPY     ETHMOIDECTOMY Right 07/19/2021   Procedure: TOTAL ETHMOIDECTOMY WITH FRONTAL SINUS EXPLORATION;  Surgeon: Vernie Murders, MD;  Location: [facility];  Service: ENT;  Laterality: Right;   FOOT SURGERY Bilateral    heer spur and bunion   IMAGE GUIDED SINUS SURGERY N/A 07/19/2021   Procedure: IMAGE GUIDED SINUS  SURGERY;  Surgeon: Vernie Murders, MD;  Location: [facility];  Service: ENT;  Laterality: N/A;   KNEE ARTHROSCOPY Left 03/02/2015   Procedure: ARTHROSCOPY  LEFT KNEE, PARTIAL LATERAL  MENISECTOMY, SYNOVECTOMY, MEDIAL & LATERAL CHONDROPLASTY;  Surgeon: Juanell Fairly, MD;  Location: [facility];  Service: Orthopedics;  Laterality: Left;   LAPAROSCOPIC SALPINGO OOPHERECTOMY Left 11/15/2014   Procedure: LAPAROSCOPIC OOPHORECTOMY;  Surgeon: Hildred Laser, MD;  Location: [facility];  Service: Gynecology;  Laterality: Left;   LAPAROSCOPIC SALPINGOOPHERECTOMY Right 09/2013   also with left salpingectomy   LEFT HEART CATH AND CORONARY ANGIOGRAPHY N/A 03/28/2020   Procedure: LEFT HEART CATH AND CORONARY ANGIOGRAPHY;  Surgeon: Iran Ouch, MD;  Location: [facility] INVASIVE CV LAB;  Service: Cardiovascular;  Laterality: N/A;   MAXILLARY ANTROSTOMY Right 07/19/2021   Procedure: MAXILLARY ANTROSTOMY WITH REMOVAL TISSUE;  Surgeon: Vernie Murders, MD;  Location: [facility];  Service: ENT;  Laterality: Right;   MINOR HEMORRHOIDECTOMY     NASAL TURBINATE REDUCTION Bilateral 07/19/2021   Procedure: INFERIOR TURBINATE REDUCTION;  Surgeon: Vernie Murders, MD;  Location: [facility];  Service: ENT;  Laterality: Bilateral;   PULMONARY THROMBECTOMY Bilateral 10/03/2022   Procedure: PULMONARY THROMBECTOMY;  Surgeon: Annice Needy, MD;  Location: [facility];  Service: Cardiovascular;  Laterality: Bilateral;   SEPTOPLASTY N/A 07/19/2021   Procedure: SEPTOPLASTY;  Surgeon: Vernie Murders, MD;  Location: [facility];  Service: ENT;  Laterality: N/A;   TONSILLECTOMY     TOTAL ABDOMINAL HYSTERECTOMY     TRACHELECTOMY N/A 11/15/2014   Procedure: TRACHELECTOMY;  Surgeon: Hildred Laser, MD;  Location: [facility];  Service: Gynecology;  Laterality: N/A;   TRIGGER FINGER RELEASE Right    TUBAL LIGATION Bilateral      Family History  Problem Relation Age of Onset   Hypertension Mother    Hyperlipidemia Mother    Stroke Mother    Colon  cancer Mother    Breast cancer Sister 25   Ovarian cancer Sister    Throat cancer Brother       Social History   Tobacco Use   Smoking status: Never   Smokeless tobacco: Never  Vaping Use   Vaping status: Never Used  Substance Use Topics   Alcohol use: Yes    Alcohol/week: 0.0 standard drinks of alcohol    Comment: occassionally   Drug use: No     Allergies  Allergen Reactions   Sulfa Antibiotics Hives   Sulfamethoxazole-Trimethoprim Hives   Jardiance [Empagliflozin] Rash    Yeast rash    Current Outpatient Medications  Medication Sig Dispense Refill   Acetaminophen (TYLENOL ARTHRITIS PAIN PO) Take 1,300 mg by mouth every 6 (six) hours as needed (Pain).     apixaban (ELIQUIS) 5 MG TABS tablet Take 5 mg by mouth 2 (two) times daily.     cyclobenzaprine (FLEXERIL) 10 MG tablet Take 1 tablet (10 mg total) by mouth 3 (three) times daily as needed for muscle spasms.     ENTRESTO 24-26 MG  Take 1 tablet by mouth 2 (two) times daily.     esomeprazole (NEXIUM) 40 MG capsule Take 40 mg by mouth every morning.      furosemide (LASIX) 20 MG tablet Take 20 mg by mouth daily.     gabapentin (NEURONTIN) 100 MG capsule Take 100 mg by mouth 3 (three) times daily.     levothyroxine (SYNTHROID) 125 MCG tablet Take 125 mcg by mouth every morning.     Magnesium 250 MG TABS Take 1 tablet by mouth daily.     metoprolol succinate (TOPROL-XL) 50 MG 24 hr tablet TAKE 1 TABLET BY MOUTH ONCE DAILY WITH  OR  IMMEDIATELY  FOLLOWING  A  MEAL 90 tablet 0   oxyCODONE-acetaminophen (PERCOCET/ROXICET) 5-325 MG tablet Take 1 tablet by mouth every 4 (four) hours as needed for severe pain.     [START ON 11/28/2022] Semaglutide-Weight Management 0.5 MG/0.5ML SOAJ Inject 0.5 mg into the skin once a week for 28 days. 2 mL 0   [START ON 12/27/2022] Semaglutide-Weight Management 1 MG/0.5ML SOAJ Inject 1 mg into the skin once a week for 28 days. 2 mL 0   spironolactone (ALDACTONE) 25 MG tablet Take 1 tablet (25 mg  total) by mouth daily. 90 tablet 3   Semaglutide-Weight Management 0.25 MG/0.5ML SOAJ Inject 0.25 mg into the skin once a week. For 4 weeks (Patient not taking: Reported on 11/19/2022) 2 mL 0   [START ON 01/25/2023] Semaglutide-Weight Management 1.7 MG/0.75ML SOAJ Inject 1.7 mg into the skin once a week for 28 days. (Patient not taking: Reported on 11/19/2022) 3 mL 0   [START ON 02/23/2023] Semaglutide-Weight Management 2.4 MG/0.75ML SOAJ Inject 2.4 mg into the skin once a week for 28 days. (Patient not taking: Reported on 11/19/2022) 3 mL 0   sertraline (ZOLOFT) 100 MG tablet Take 200 mg by mouth daily. (Patient not taking: Reported on 11/20/2022)     No current facility-administered medications for this visit.      REVIEW OF SYSTEMS (Negative unless checked)  Constitutional: [] Weight loss  [] Fever  [] Chills Cardiac: [] Chest pain   [] Chest pressure   [] Palpitations   [] Shortness of breath when laying flat   [] Shortness of breath at rest   [] Shortness of breath with exertion. Vascular:  [] Pain in legs with walking   [] Pain in legs at rest   [] Pain in legs when laying flat   [] Claudication   [] Pain in feet when walking  [] Pain in feet at rest  [] Pain in feet when laying flat   [x] History of DVT   [x] Phlebitis   [x] Swelling in legs   [] Varicose veins   [] Non-healing ulcers Pulmonary:   [] Uses home oxygen   [] Productive cough   [] Hemoptysis   [] Wheeze  [] COPD   [] Asthma Neurologic:  [] Dizziness  [] Blackouts   [] Seizures   [] History of stroke   [] History of TIA  [] Aphasia   [] Temporary blindness   [] Dysphagia   [] Weakness or numbness in arms   [] Weakness or numbness in legs Musculoskeletal:  [] Arthritis   [] Joint swelling   [] Joint pain   [] Low back pain Hematologic:  [] Easy bruising  [] Easy bleeding   [] Hypercoagulable state   [x] Anemic  [] Hepatitis Gastrointestinal:  [] Blood in stool   [] Vomiting blood  [x] Gastroesophageal reflux/heartburn   [] Abdominal pain Genitourinary:  [] Chronic kidney disease    [] Difficult urination  [] Frequent urination  [] Burning with urination   [] Hematuria Skin:  [] Rashes   [] Ulcers   [] Wounds Psychological:  [x] History of anxiety   [x]  History  of major depression.    Physical Exam BP 121/81 (BP Location: Left Arm)   Pulse 72   Resp 18   Ht 5\' 7"  (1.702 m)   Wt 255 lb 3.2 oz (115.8 kg)   BMI 39.97 kg/m  Gen:  WD/WN, NAD Head: [place]/AT, No temporalis wasting.  Ear/Nose/Throat: Hearing grossly intact, nares w/o erythema or drainage, oropharynx w/o Erythema/Exudate Eyes: Conjunctiva clear, sclera non-icteric  Neck: trachea midline.  No JVD.  Pulmonary:  Good air movement, respirations not labored, no use of accessory muscles  Cardiac: RRR, no JVD Vascular:  Vessel Right Left  Radial Palpable Palpable                                   Gastrointestinal:. No masses, surgical incisions, or scars. Musculoskeletal: M/S 5/5 throughout.  Extremities without ischemic changes.  No deformity or atrophy. 1+ BLE edema. Neurologic: Sensation grossly intact in extremities.  Symmetrical.  Speech is fluent. Motor exam as listed above. Psychiatric: Judgment intact, Mood & affect appropriate for pt's clinical situation. Dermatologic: No rashes or ulcers noted.  No cellulitis or open wounds.    Radiology No results found.  Labs Recent Results (from the past 2160 hour(s))  Basic metabolic panel     Status: Abnormal   Collection Time: 10/02/22  5:48 PM  Result Value Ref Range   Sodium 142 135 - 145 mmol/L   Potassium 3.6 3.5 - 5.1 mmol/L   Chloride 105 98 - 111 mmol/L   CO2 28 22 - 32 mmol/L   Glucose, Bld 110 (H) 70 - 99 mg/dL    Comment: Glucose reference range applies only to samples taken after fasting for at least 8 hours.   BUN 11 6 - 20 mg/dL   Creatinine, Ser 1.19 0.44 - 1.00 mg/dL   Calcium 9.6 8.9 - 14.7 mg/dL   GFR, Estimated >82 >95 mL/min    Comment: (NOTE) Calculated using the CKD-EPI Creatinine Equation (2021)    Anion gap 9 5 - 15     Comment: Performed at [facility], [address] [place], [place] [address]  CBC     Status: Abnormal   Collection Time: 10/02/22  5:48 PM  Result Value Ref Range   WBC 5.6 4.0 - 10.5 K/uL   RBC 4.89 3.87 - 5.11 MIL/uL   Hemoglobin 14.9 12.0 - 15.0 g/dL   HCT 86.5 78.4 - 69.6 %   MCV 93.7 80.0 - 100.0 fL   MCH 30.5 26.0 - 34.0 pg   MCHC 32.5 30.0 - 36.0 g/dL   RDW 29.5 28.4 - 13.2 %   Platelets 139 (L) 150 - 400 K/uL   nRBC 0.0 0.0 - 0.2 %    Comment: Performed at [facility], [address] [place], [place] [address]  Troponin I (High Sensitivity)     Status: Abnormal   Collection Time: 10/02/22  5:48 PM  Result Value Ref Range   Troponin I (High Sensitivity) 52 (H) <18 ng/L    Comment: (NOTE) Elevated high sensitivity troponin I (hsTnI) values and significant  changes across serial measurements may suggest ACS but many other  chronic and acute conditions are known to elevate hsTnI results.  Refer to the "Links" section for chest pain algorithms and additional  guidance. Performed at [facility], [address], [place], [place] [address]   Troponin I (High Sensitivity)     Status: Abnormal  Collection Time: 10/02/22  9:46 PM  Result Value Ref Range   Troponin I (High Sensitivity) 126 (HH) <18 ng/L    Comment: CRITICAL RESULT CALLED TO, READ BACK BY AND VERIFIED WITH TRACEY [id number]  10/02/22 RH (NOTE) Elevated high sensitivity troponin I (hsTnI) values and significant  changes across serial measurements may suggest ACS but many other  chronic and acute conditions are known to elevate hsTnI results.  Refer to the "Links" section for chest pain algorithms and additional  guidance. Performed at [facility], [address] [place], [place] [address]   Heparin level (unfractionated)     Status: Abnormal   Collection Time: 10/03/22 12:19 AM  Result Value Ref Range   Heparin Unfractionated 0.71 (H) 0.30 - 0.70 IU/mL     Comment: (NOTE) The clinical reportable range upper limit is being lowered to >1.10 to align with the FDA approved guidance for the current laboratory assay.  If heparin results are below expected values, and patient dosage has  been confirmed, suggest follow up testing of antithrombin III levels. Performed at [facility], [address] [place], [place] [address]   Protime-INR     Status: None   Collection Time: 10/03/22 12:19 AM  Result Value Ref Range   Prothrombin Time 14.9 11.4 - 15.2 seconds   INR 1.1 0.8 - 1.2    Comment: (NOTE) INR goal varies based on device and disease states. Performed at [facility], [address] [place], [place] [address]   APTT     Status: Abnormal   Collection Time: 10/03/22 12:19 AM  Result Value Ref Range   aPTT 151 (H) 24 - 36 seconds    Comment:        IF BASELINE aPTT IS ELEVATED, SUGGEST PATIENT RISK ASSESSMENT BE USED TO DETERMINE APPROPRIATE ANTICOAGULANT THERAPY. Performed at [facility], [address] [place], [place] [address]   Basic metabolic panel     Status: Abnormal   Collection Time: 10/03/22  6:29 AM  Result Value Ref Range   Sodium 139 135 - 145 mmol/L   Potassium 3.4 (L) 3.5 - 5.1 mmol/L   Chloride 107 98 - 111 mmol/L   CO2 25 22 - 32 mmol/L   Glucose, Bld 106 (H) 70 - 99 mg/dL    Comment: Glucose reference range applies only to samples taken after fasting for at least 8 hours.   BUN 13 6 - 20 mg/dL   Creatinine, Ser 4.13 0.44 - 1.00 mg/dL   Calcium 8.7 (L) 8.9 - 10.3 mg/dL   GFR, Estimated >24 >40 mL/min    Comment: (NOTE) Calculated using the CKD-EPI Creatinine Equation (2021)    Anion gap 7 5 - 15    Comment: Performed at [facility], [address] [place], [place] [address]  CBC     Status: Abnormal   Collection Time: 10/03/22  6:29 AM  Result Value Ref Range   WBC 5.9 4.0 - 10.5 K/uL   RBC 4.60 3.87 - 5.11 MIL/uL   Hemoglobin 14.2 12.0 - 15.0 g/dL   HCT  53.6 64.4 - 03.4 %   MCV 92.8 80.0 - 100.0 fL   MCH 30.9 26.0 - 34.0 pg   MCHC 33.3 30.0 - 36.0 g/dL   RDW 74.2 59.5 - 63.8 %   Platelets 127 (L) 150 - 400 K/uL   nRBC 0.0 0.0 - 0.2 %    Comment: Performed at [facility], [address], [place], [place]  [address]  APTT     Status: Abnormal   Collection Time: 10/03/22  6:30 AM  Result Value Ref Range   aPTT 150 (H) 24 - 36 seconds    Comment:        IF BASELINE aPTT IS ELEVATED, SUGGEST PATIENT RISK ASSESSMENT BE USED TO DETERMINE APPROPRIATE ANTICOAGULANT THERAPY. Performed at [facility], [address] [place], [place] [address]   Heparin level (unfractionated)     Status: None   Collection Time: 10/03/22  6:30 AM  Result Value Ref Range   Heparin Unfractionated 0.66 0.30 - 0.70 IU/mL    Comment: (NOTE) The clinical reportable range upper limit is being lowered to >1.10 to align with the FDA approved guidance for the current laboratory assay.  If heparin results are below expected values, and patient dosage has  been confirmed, suggest follow up testing of antithrombin III levels. Performed at [facility], [address] [place], [place] [address]   ECHOCARDIOGRAM COMPLETE     Status: None   Collection Time: 10/03/22  8:43 AM  Result Value Ref Range   Weight 4,032 oz   Height 67 in   BP 113/80 mmHg   Ao pk vel 0.80 m/s   AV Area VTI 2.82 cm2   AR max vel 3.28 cm2   AV Mean grad 1.0 mmHg   AV Peak grad 2.5 mmHg   Single Plane A2C EF 27.5 %   Single Plane A4C EF 39.3 %   Calc EF 35.0 %   S' Lateral 3.90 cm   AV Area mean vel 2.81 cm2   Area-P 1/2 6.12 cm2   MV VTI 3.11 cm2   Est EF 30 - 35%   NM Myocar Multi W/Spect W/Wall Motion / EF     Status: None   Collection Time: 10/03/22  1:18 PM  Result Value Ref Range   Rest Nuclear Isotope Dose 10.3 mCi   Stress Nuclear Isotope Dose 31.6 mCi   SSS 12.0    SRS 6.0    SDS 3.0    TID 1.10    LV sys vol 60.0 mL   LV dias vol  107.0 46 - 106 mL   Nuc Stress EF 44 %   Rest HR 81.0 bpm   Rest BP 93/65 mmHg   Exercise duration (min) 1 min   Exercise duration (sec) 0 sec   Estimated workload 1.0    Peak HR 86 bpm   Peak BP 110/71 mmHg   MPHR 110 bpm   Percent HR 68.0 %   ST Depression (mm) 0 mm  D-dimer, quantitative     Status: Abnormal   Collection Time: 10/03/22  6:40 PM  Result Value Ref Range   D-Dimer, Quant 16.05 (H) 0.00 - 0.50 ug/mL-FEU    Comment: (NOTE) At the manufacturer cut-off value of 0.5 g/mL FEU, this assay has a negative predictive value of 95-100%.This assay is intended for use in conjunction with a clinical pretest probability (PTP) assessment model to exclude pulmonary embolism (PE) and deep venous thrombosis (DVT) in outpatients suspected of PE or DVT. Results should be correlated with clinical presentation. Performed at [facility], [address] [place], [place] [address]   Heparin level (unfractionated)     Status: None   Collection Time: 10/03/22 11:06 PM  Result Value Ref Range   Heparin Unfractionated 0.52 0.30 - 0.70 IU/mL    Comment: (NOTE) The clinical reportable range upper limit is being lowered to >1.10  to align with the FDA approved guidance for the current laboratory assay.  If heparin results are below expected values, and patient dosage has  been confirmed, suggest follow up testing of antithrombin III levels. Performed at [facility], [address] [place], [place] [address]   CBC     Status: Abnormal   Collection Time: 10/04/22  4:22 AM  Result Value Ref Range   WBC 7.3 4.0 - 10.5 K/uL   RBC 4.21 3.87 - 5.11 MIL/uL   Hemoglobin 12.8 12.0 - 15.0 g/dL   HCT 84.1 32.4 - 40.1 %   MCV 94.1 80.0 - 100.0 fL   MCH 30.4 26.0 - 34.0 pg   MCHC 32.3 30.0 - 36.0 g/dL   RDW 02.7 25.3 - 66.4 %   Platelets 115 (L) 150 - 400 K/uL   nRBC 0.0 0.0 - 0.2 %    Comment: Performed at [facility], [address] [place], [place]  [address]  Heparin level (unfractionated)     Status: None   Collection Time: 10/04/22  4:22 AM  Result Value Ref Range   Heparin Unfractionated 0.69 0.30 - 0.70 IU/mL    Comment: (NOTE) The clinical reportable range upper limit is being lowered to >1.10 to align with the FDA approved guidance for the current laboratory assay.  If heparin results are below expected values, and patient dosage has  been confirmed, suggest follow up testing of antithrombin III levels. Performed at [facility], [address] [place], [place] [address]   Basic metabolic panel     Status: Abnormal   Collection Time: 10/04/22  7:55 AM  Result Value Ref Range   Sodium 133 (L) 135 - 145 mmol/L   Potassium 3.8 3.5 - 5.1 mmol/L   Chloride 102 98 - 111 mmol/L   CO2 22 22 - 32 mmol/L   Glucose, Bld 103 (H) 70 - 99 mg/dL    Comment: Glucose reference range applies only to samples taken after fasting for at least 8 hours.   BUN 13 6 - 20 mg/dL   Creatinine, Ser 6.38 0.44 - 1.00 mg/dL   Calcium 8.8 (L) 8.9 - 10.3 mg/dL   GFR, Estimated >75 >64 mL/min    Comment: (NOTE) Calculated using the CKD-EPI Creatinine Equation (2021)    Anion gap 9 5 - 15    Comment: Performed at [facility], [address] [place], [place] [address]  Basic metabolic panel     Status: Abnormal   Collection Time: 10/10/22 10:14 AM  Result Value Ref Range   Sodium 137 135 - 145 mmol/L   Potassium 4.2 3.5 - 5.1 mmol/L   Chloride 106 98 - 111 mmol/L   CO2 23 22 - 32 mmol/L   Glucose, Bld 105 (H) 70 - 99 mg/dL    Comment: Glucose reference range applies only to samples taken after fasting for at least 8 hours.   BUN 14 6 - 20 mg/dL   Creatinine, Ser 1.88 0.44 - 1.00 mg/dL   Calcium 9.1 8.9 - 41.6 mg/dL   GFR, Estimated >60 >63 mL/min    Comment: (NOTE) Calculated using the CKD-EPI Creatinine Equation (2021)    Anion gap 8 5 - 15    Comment: Performed at [facility], [address] [place],  [place] [address]  CBC     Status: None   Collection Time: 10/10/22 10:14 AM  Result Value Ref Range   WBC 5.4 4.0 - 10.5 K/uL  RBC 4.13 3.87 - 5.11 MIL/uL   Hemoglobin 12.8 12.0 - 15.0 g/dL   HCT 30.8 65.7 - 84.6 %   MCV 96.1 80.0 - 100.0 fL   MCH 31.0 26.0 - 34.0 pg   MCHC 32.2 30.0 - 36.0 g/dL   RDW 96.2 95.2 - 84.1 %   Platelets 173 150 - 400 K/uL   nRBC 0.0 0.0 - 0.2 %    Comment: Performed at [facility], [address] [place], [place] [address]  Troponin I (High Sensitivity)     Status: None   Collection Time: 10/10/22 10:14 AM  Result Value Ref Range   Troponin I (High Sensitivity) <2 <18 ng/L    Comment: (NOTE) Elevated high sensitivity troponin I (hsTnI) values and significant  changes across serial measurements may suggest ACS but many other  chronic and acute conditions are known to elevate hsTnI results.  Refer to the "Links" section for chest pain algorithms and additional  guidance. Performed at [facility], [address] [place], [place] [address]   Troponin I (High Sensitivity)     Status: None   Collection Time: 10/10/22 12:41 PM  Result Value Ref Range   Troponin I (High Sensitivity) <2 <18 ng/L    Comment: (NOTE) Elevated high sensitivity troponin I (hsTnI) values and significant  changes across serial measurements may suggest ACS but many other  chronic and acute conditions are known to elevate hsTnI results.  Refer to the "Links" section for chest pain algorithms and additional  guidance. Performed at [facility], [address] [place], [place] [address]   Antithrombin III     Status: None   Collection Time: 11/02/22  3:22 PM  Result Value Ref Range   AntiThromb III Func 117 75 - 120 %    Comment: Performed at [facility], [address] [place], [place] [address]  PROTEIN S PANEL     Status: Abnormal   Collection Time: 11/02/22  3:22 PM  Result Value Ref Range   Protein S Ag, Total 87 60 - 150 %     Comment: (NOTE) This test was developed and its performance characteristics determined by [facility]. It has not been cleared or approved by the Food and Drug Administration.    Protein S Ag, Free 94 61 - 136 %   Protein S Activity 60 (L) 63 - 140 %    Comment: (NOTE) A deficiency of protein S (PS), either congenital or acquired, increases the risk of thromboembolism. PS activity levels may be falsely low in individuals with APCR/Factor V Leiden. Consider performing free protein S antigen in those with APCR/Factor V Leiden before making a diagnosis of protein S deficiency. Acquired PS deficiency is more common than congenital deficiency. PS values decrease with normal pregnancy, and are also dependent on age, sex and hormone status. PS values tend to be lower in a younger age group and lower in women than in men. Levels may be decreased in pre-menopausal women on oral contraceptive agents. Acquired deficiency can occur as a result of vitamin K deficiency or antagonism, severe hepatic disorders, (hepatitis, cirrhosis, etc.), nephrotic syndrome, inflammatory bowel disease, certain chemotherapeutic agents, L-asparaginse therapy, sepsis, disseminated intravascular coagulation (DIC) and acute thrombosis. Levels may be decreased in  patients with polycythemia vera, sickle cell disease and essential thrombocythemia. Repeat evaluation on a new plasma sample to confirm or refute this result should be considered, after ruling out acquired causes, depending on the clinical scenario. Performed At:  [facility] [address] [place], [place] [id number] Jolene Schimke MD QM:[id number]   Factor 5 leiden     Status: None   Collection Time: 11/02/22  3:22 PM  Result Value Ref Range   Recommendations-F5LEID: Comment     Comment: (NOTE) Result: c.1601G>A (p.Arg534Gln) - Not Detected This result is not associated with an increased risk for venous thromboembolism. See Additional Clinical  Information and Comments. Additional Clinical Information: Venous thromboembolism is a multifactorial disease influenced by genetic, environmental, and circumstantial risk factors. The c.1601G>A (p. Arg534Gln) variant in the F5 gene, commonly referred to as Factor V Leiden, is a genetic risk factor for venous thromboembolism. Heterozygous carriers of this variant have a 6- to 8- fold increased risk for venous thromboembolism. Individuals homozygous for this variant (ie, with a copy of the variant on each chromosome) have an approximately 80-fold increased risk for venous thromboembolism. Individuals who carry both a c.*97G>A variant in the F2 gene and Factor V Leiden have an approximately 20-fold increased risk for venous thromboembolism. Risks are likely to be even higher in more complex genotype combinations in volving the F2 c.*97G>A variant and Factor V Leiden (PMID: 28413244). Additional risk factors include but are not limited to: deficiency of protein C, protein S, or antithrombin III, age, female sex, personal or family history of deep vein thromboembolism, smoking, surgery, prolonged immobilization, malignant neoplasm, tamoxifen treatment, raloxifene treatment, oral contraceptive use, hormone replacement therapy, and pregnancy. Management of thrombotic risk and thrombotic events should follow established guidelines and fit the clinical circumstance. This result cannot predict the occurrence or recurrence of a thrombotic event. Comment: Genetic counseling is recommended to discuss the potential clinical implications of positive results, as well as recommendations for testing family members. Genetic Coordinators are available for health care providers to discuss results at [phone]). Test Details: Variant Analyzed: c.1601G>A (p. Arg534Gln), referred to as Fact or V Leiden Methods/Limitations: DNA analysis of the F5 gene (NM_000130.5) was performed by  PCR amplification followed by restriction enzyme analysis. The diagnostic sensitivity is >99%. Results must be combined with clinical information for the most accurate interpretation. Molecular- based testing is highly accurate, but as in any laboratory test, diagnostic errors may occur. False positive or false negative results may occur for reasons that include genetic variants, blood transfusions, bone marrow transplantation, somatic or tissue-specific mosaicism, mislabeled samples, or erroneous representation of family relationships. This test was developed and its performance characteristics determined by [facility]. It has not been cleared or approved by the Food and Drug Administration. References: Dewitt Hoes [facility], Valentina Lucks [facility]; ACMG Professional Practice and Guidelines Committee. Addendum: [employer] of Medical Genetics consensus statement on fac tor V Leiden mutation testing. Genet Med. 2021 Mar 5. doi: 72.5366/Y40347-425- 01108-x. PMID: 95638756. Cherrie Gauze. Factor V Leiden Thrombophilia. 1999 May 14 (Updated 2018 Jan 4). In: Bufford Buttner, Ardinger HH, Pagon RA, et al., editors. GeneReviews(R) (Internet). [address] ([place]): [place], [place]; 4332-9518. Available from: [email or web address]-mcgee.org/ Nita Sickle, Blanca Friend, Alvie Heidelberg CS; ACMG Laboratory Quality Assurance Committee. Venous thromboembolism laboratory testing (factor V Leiden and factor II c.*97G>A), 2018 update: a technical standard of the [employer] of [employer] and Genomics (ACMG). Genet Med. 2018 Dec;20(12):1489-1498. doi: 10.1038/[id number]-z. Epub 2018 Oct 5. PMID: 84166063.    Reviewed By: Comment     Comment: (NOTE) Technical Component performed at [employer] [facility] Professional Component performed by: [employer] Elgie Collard, Ph.D., [facility] Director,  Molecular Genetics [address] [place] [address] Performed At: [employer] [facility] [address] [place], [place] [id number] Maurine Simmering MDPhD NW:[id number]   Protein C, total     Status: None   Collection Time: 11/02/22  3:22 PM  Result Value Ref Range   Protein C, Total 74 60 - 150 %    Comment: (NOTE) Performed At: [facility] [address] [place], [place] [id number] Jolene Schimke MD BM:[id number]   Lupus anticoagulant     Status: Abnormal   Collection Time: 11/02/22  3:22 PM  Result Value Ref Range   dPT 46.5 0.0 - 47.6 sec   dPT Confirm Ratio 0.97 0.00 - 1.34 Ratio   Thrombin Time 20.4 0.0 - 23.0 sec   PTT Lupus Anticoagulant 36.0 0.0 - 43.5 sec   DRVVT 57.3 (H) 0.0 - 47.0 sec   Lupus Anticoag Interp Comment:     Comment: (NOTE) No lupus anticoagulant was detected. These results are consistent with specific inhibitors to one or more common pathway factors (X, V, II or fibrinogen). Performed At: [facility] [address] [place], [place] [id number] Jolene Schimke MD IH:[id number]   Prothrombin gene mutation     Status: None   Collection Time: 11/02/22  3:22 PM  Result Value Ref Range   Recommendations-PTGENE: Comment     Comment: (NOTE) Result: c.*97G>A - Not Detected This result is not associated with an increased risk for venous thromboembolism. See Additional Clinical Information and Comments. Additional Clinical Information: Venous thromboembolism is a multifactorial disease influenced by genetic, environmental, and circumstantial risk factors. The c.*97G>A variant in the F2 gene is a genetic risk factor for venous thromboembolism. Heterozygous carriers have a 2- to 4-fold increased risk for venous thromboembolism. Homozygotes for the c.*97G>A variant are rare. The annual risk of VTE in homozygotes has been reported to be 1.1%/year. Individuals who carry both a c.*97G>A variant in the F2 gene and a c.1601G>A (p. Arg534Gln) variant in the F5 gene (commonly referred  to as Factor V Leiden) have an approximately 20- fold increased risk for venous thromboembolism. Risks are likely to be even higher in more complex genotype combinations involving the F2 c.*97G>A variant and Factor V Leiden (PMID:  75643329). Additional risk factors include but are not limited to: deficiency of protein C, protein S, or antithrombin III, age, female sex, personal or family history of deep vein thromboembolism, smoking, surgery, prolonged immobilization, malignant neoplasm, tamoxifen treatment, raloxifene treatment, oral contraceptive use, hormone replacement therapy, and pregnancy. Management of thrombotic risk and thrombotic events should follow established guidelines and fit the clinical circumstance. This result cannot predict the occurrence or recurrence of a thrombotic event. Comments: Genetic counseling is recommended to discuss the potential clinical implications of positive results, as well as recommendations for testing family members. Genetic Coordinators are available for health care providers to discuss results at [phone]). Test Details: Variant analyzed: c.*97G>A, previously referred to as G20210A Methods/Limitations: DNA analysis of the F2 gene (NM_000 506.5) was performed by PCR amplification followed by restriction enzyme analysis. The diagnostic sensitivity is >99%. Results must be combined with clinical information for the most accurate interpretation. Molecular-based testing is highly accurate, but as in any laboratory test, diagnostic errors may occur. False positive or false negative results may occur for reasons that include genetic variants, blood transfusions, bone marrow transplantation, somatic or tissue-specific mosaicism, mislabeled samples, or erroneous representation of family relationships. This test was developed and its performance characteristics determined by [facility]. It has  not been cleared or approved by the Food and  Drug Administration. References: Dewitt Hoes [facility], Valentina Lucks [facility]; ACMG Professional Practice and Guidelines Committee. Addendum: [employer] of Medical Genetics consensus statement on factor V Leiden mutation testing. Genet Med. 2021 Mar 5. doi: 16.1096/E454 [id number]. PMID: 09811914. Cherrie Gauze. Prothrombin Thrombophilia. 2006 Jul 25 [Updated 2021 Feb 4]. In: Bufford Buttner, Ardinger HH, Pagon RA, et al., editors. GeneReviews(R) [Internet]. [address] ([place]): [place], [place]; 7829-5621. Available from: [email or web address].com/ Nita Sickle, Blanca Friend, Alvie Heidelberg CS; ACMG Laboratory Quality Assurance Committee. Venous thromboembolism laboratory testing (factor V Leiden and factor II c.*97G>A), 2018 update: a technical standard of the [employer] of [employer] and Genomics (ACMG). Genet Med. 2018 Dec;20(12):1489-1498. doi: 10.1038/[id number]. Epub 2018 Oct 5. PMID: 30865784.    Reviewed by: Comment     Comment: (NOTE) Alpha Gula, PhD Director, Molecular Genetics Performed At: [facility] [address] [place], [place] [id number] Maurine Simmering MDPhD XL:[id number]   Beta-2-glycoprotein i abs, IgG/M/A     Status: None   Collection Time: 11/02/22  3:22 PM  Result Value Ref Range   Beta-2 Glyco I IgG <9 0 - 20 GPI IgG units    Comment: (NOTE) The reference interval reflects a 3SD or 99th percentile interval, which is thought to represent a potentially clinically significant result in accordance with the International Consensus Statement on the classification criteria for definitive antiphospholipid syndrome (APS). J Thromb Haem 2006;4:295-306.    Beta-2-Glycoprotein I IgM 12 0 - 32 GPI IgM units    Comment: (NOTE) The reference interval reflects a 3SD or 99th percentile interval, which is thought to represent a potentially clinically significant result in accordance with  the International Consensus Statement on the classification criteria for definitive antiphospholipid syndrome (APS). J Thromb Haem 2006;4:295-306. Performed At: [facility] [address] [place], [place] [id number] Jolene Schimke MD QQ:[id number]    Beta-2-Glycoprotein I IgA <9 0 - 25 GPI IgA units    Comment: (NOTE) The reference interval reflects a 3SD or 99th percentile interval, which is thought to represent a potentially clinically significant result in accordance with the International Consensus Statement on the classification criteria for definitive antiphospholipid syndrome (APS). J Thromb Haem 2006;4:295-306.   Cardiolipin antibodies, IgG, IgM, IgA     Status: None   Collection Time: 11/02/22  3:22 PM  Result Value Ref Range   Anticardiolipin IgG <9 0 - 14 GPL U/mL    Comment: (NOTE)                          Negative:              <15                          Indeterminate:     15 - 20                          Low-Med Positive: >20 - 80                          High Positive:         >80    Anticardiolipin IgM <9 0 - 12 MPL U/mL    Comment: (NOTE)  Negative:              <13                          Indeterminate:     13 - 20                          Low-Med Positive: >20 - 80                          High Positive:         >80    Anticardiolipin IgA <9 0 - 11 APL U/mL    Comment: (NOTE)                          Negative:              <12                          Indeterminate:     12 - 20                          Low-Med Positive: >20 - 80                          High Positive:         >80 Performed At: [facility] [address] [place], [place] [id number] Jolene Schimke MD WU:[id number]   Hexagonal Phase Phospholipid     Status: None   Collection Time: 11/02/22  3:22 PM  Result Value Ref Range   Hex Phosph Neut Test 4 0 - 11 sec    Comment: (NOTE) Performed At: [facility] [address] [place], [place]  [id number] Jolene Schimke MD VH:[id number]   Hex phase phospholipid reflex     Status: None   Collection Time: 11/02/22  3:22 PM  Result Value Ref Range   Hex phase phospholipid comment Comment     Comment: (NOTE) Results do not indicate the presence of a Lupus Anticoagulant: abnormal high screening results (PTT-LA, dRVVT, mixing studies), may be due to medication (heparin, warfarin, aspirin), Factor inhibitors, anticardiolipin antibodies, or poor specimen integrity. Performed At: [facility] [address] [place], [place] [id number] Jolene Schimke MD UV:[id number]   dRVVT Mix     Status: Abnormal   Collection Time: 11/02/22  3:22 PM  Result Value Ref Range   dRVVT Mix 46.6 (H) 0.0 - 40.4 sec    Comment: (NOTE) Performed At: [facility] [address] [place], [place] [id number] Jolene Schimke MD VF:[id number]   dRVVT Confirm     Status: None   Collection Time: 11/02/22  3:22 PM  Result Value Ref Range   dRVVT Confirm 0.9 0.8 - 1.2 ratio    Comment: (NOTE) Performed At: [facility] [address] [place], [place] [id number] Jolene Schimke MD SW:[id number]     Assessment/Plan:  Pulmonary emboli [facility]) The patient is doing well from a pulmonary standpoint after thrombectomy about a month ago.  She will continue full dose anticoagulation for at least 1 year and then at that point we can discuss reduction of the dose of anticoagulation or even cessation of anticoagulation depending on her preferences.  Essential hypertension blood pressure control important in reducing  the progression of atherosclerotic disease. On appropriate oral medications.   Swelling of limb Recommend:  I have had a long discussion with the patient regarding swelling and why it  causes symptoms.  Patient will begin wearing graduated compression on a daily basis a prescription was given. The patient will  wear the stockings first thing in the morning and removing them in the  evening. The patient is instructed specifically not to sleep in the stockings.   In addition, behavioral modification will be initiated.  This will include frequent elevation, use of over the counter pain medications and exercise such as walking.  Consideration for a lymph pump will also be made based upon the effectiveness of conservative therapy.  This would help to improve the edema control and prevent sequela such as ulcers and infections   Patient should undergo duplex ultrasound of the venous system to ensure that DVT or reflux is not present.  The patient will follow-up with me after the ultrasound.       Festus Barren 11/21/2022, 11:25 AM   This note was created with Dragon medical transcription system.  Any errors from dictation are unintentional.

## 2022-11-21 ENCOUNTER — Telehealth: Payer: Self-pay | Admitting: Pharmacy Technician

## 2022-11-21 ENCOUNTER — Other Ambulatory Visit (HOSPITAL_COMMUNITY): Payer: Self-pay

## 2022-11-21 ENCOUNTER — Other Ambulatory Visit: Payer: Self-pay

## 2022-11-21 DIAGNOSIS — M7989 Other specified soft tissue disorders: Secondary | ICD-10-CM | POA: Insufficient documentation

## 2022-11-21 NOTE — Telephone Encounter (Signed)
Can you check and see if patients wegovy needs a PA please.

## 2022-11-21 NOTE — Assessment & Plan Note (Signed)
blood pressure control important in reducing the progression of atherosclerotic disease. On appropriate oral medications.  

## 2022-11-21 NOTE — Telephone Encounter (Signed)
Pharmacy Patient Advocate Encounter   Received notification from Pt Calls Messages that prior authorization for wegovy is required/requested.   Insurance verification completed.   The patient is insured through [employer] .   Per test claim: PA required; PA submitted to [employer] via CoverMyMeds Key/confirmation #/EOC [id number] Status is pending

## 2022-11-21 NOTE — Assessment & Plan Note (Signed)
The patient is doing well from a pulmonary standpoint after thrombectomy about a month ago.  She will continue full dose anticoagulation for at least 1 year and then at that point we can discuss reduction of the dose of anticoagulation or even cessation of anticoagulation depending on her preferences.

## 2022-11-21 NOTE — Assessment & Plan Note (Signed)

## 2022-11-22 ENCOUNTER — Other Ambulatory Visit (HOSPITAL_COMMUNITY): Payer: Self-pay

## 2022-11-22 NOTE — Telephone Encounter (Signed)
Pharmacy Patient Advocate Encounter  Received notification from [facility] that Prior Authorization for [facility] has been APPROVED from 11/22/22 to 01/29/24. Ran test claim, Copay is $317.10-one month (GAP). This test claim was processed through [facility]- copay amounts may vary at other pharmacies due to pharmacy/plan contracts, or as the patient moves through the different stages of their insurance plan.   PA #/Case ID/Reference #: Radene Knee

## 2022-11-23 ENCOUNTER — Telehealth: Payer: Self-pay | Admitting: Cardiology

## 2022-11-23 NOTE — Telephone Encounter (Signed)
Pt c/o medication issue:  1. Name of Medication:   Semaglutide-Weight Management 0.5 MG/0.5ML SOAJ    2. How are you currently taking this medication (dosage and times per day)? Inject 0.5 mg into the skin once a week for 28 days.   3. Are you having a reaction (difficulty breathing--STAT)? no  4. What is your medication issue? Calling to check status of PA

## 2022-11-23 NOTE — Telephone Encounter (Signed)
Called [facility] pharmacy to verify if patients wegovy would go through since we received an approval on the PA.   Cost with insurance is $323  Called patient to make her aware.

## 2022-12-05 ENCOUNTER — Telehealth: Payer: Self-pay | Admitting: Cardiology

## 2022-12-06 NOTE — Telephone Encounter (Signed)
Spoke with pt and scheduled an office visit for tomorrow. Pt said she is mostly concerned with shortness of breath w/ exertion.

## 2022-12-07 ENCOUNTER — Encounter: Payer: Self-pay | Admitting: Family

## 2022-12-07 ENCOUNTER — Ambulatory Visit: Payer: Medicare HMO | Attending: Family | Admitting: Family

## 2022-12-07 VITALS — BP 117/81 | HR 89 | Wt 252.1 lb

## 2022-12-07 DIAGNOSIS — Z79899 Other long term (current) drug therapy: Secondary | ICD-10-CM | POA: Insufficient documentation

## 2022-12-07 DIAGNOSIS — G4733 Obstructive sleep apnea (adult) (pediatric): Secondary | ICD-10-CM | POA: Insufficient documentation

## 2022-12-07 DIAGNOSIS — I5022 Chronic systolic (congestive) heart failure: Secondary | ICD-10-CM | POA: Insufficient documentation

## 2022-12-07 DIAGNOSIS — I428 Other cardiomyopathies: Secondary | ICD-10-CM | POA: Diagnosis not present

## 2022-12-07 DIAGNOSIS — I110 Hypertensive heart disease with heart failure: Secondary | ICD-10-CM | POA: Diagnosis not present

## 2022-12-07 DIAGNOSIS — I5042 Chronic combined systolic (congestive) and diastolic (congestive) heart failure: Secondary | ICD-10-CM | POA: Diagnosis not present

## 2022-12-07 DIAGNOSIS — Z86718 Personal history of other venous thrombosis and embolism: Secondary | ICD-10-CM | POA: Insufficient documentation

## 2022-12-07 DIAGNOSIS — I10 Essential (primary) hypertension: Secondary | ICD-10-CM

## 2022-12-07 DIAGNOSIS — I50810 Right heart failure, unspecified: Secondary | ICD-10-CM

## 2022-12-07 DIAGNOSIS — Z6841 Body Mass Index (BMI) 40.0 and over, adult: Secondary | ICD-10-CM | POA: Diagnosis not present

## 2022-12-07 DIAGNOSIS — Z7901 Long term (current) use of anticoagulants: Secondary | ICD-10-CM | POA: Diagnosis not present

## 2022-12-07 DIAGNOSIS — I4729 Other ventricular tachycardia: Secondary | ICD-10-CM | POA: Diagnosis not present

## 2022-12-07 DIAGNOSIS — I2699 Other pulmonary embolism without acute cor pulmonale: Secondary | ICD-10-CM | POA: Diagnosis not present

## 2022-12-07 NOTE — Patient Instructions (Addendum)
Go DOWN to LOWER LEVEL (LL) to have your blood work completed inside of [employer] office.  We will ONLY call you if your results are abnormal. If they're normal, they'll be available on your Mychart.

## 2022-12-07 NOTE — Progress Notes (Signed)
ADVANCED HF CLINIC CONSULT NOTE   Primary Care: Jerl Mina, MD (last seen 09/24) Primary Cardiologist: Debbe Odea, MD (last seen 10/24) HF provider: Dorthula Nettles, MD (last seen 10/24)  HPI:  Lisa Fuentes is a 62 y.o. female with a hx of systolic HF due to NICM, VT s/p MDT ICD morbid obesity, hypertension, PE s/p endovascular thrombolysis 9/24 on Eliquis, OSA and RUE DVT (06/23). Diagnosed with systolic HF in 2000. Cath 2003 no CAD. Had repeat cath 2022 with normal cors.  Had RUE DVT 6/23 after trauma. Upper extremity u/s with multiple RUE DVTs. CTA chest LLL segmental and subsegmental pulmonary emboli without evidence of RV strain. Started on Eliquis. She completed several months of Eliquis, and it was then discontinued per hematology.   Admitted [facility] 10/01/2022. CTA chest with moderate volume of acute PE with RV strain. She was started on heparin. She underwent pulmonary thrombectomy. D/c'd on Eliquis. Nuclear stress test on 10/03/2022,  LVEF 44% with moderate fixed basal septal defect without ischemia. Echo 10/03/22 EF 30-35% mild TR. Was in the ED 10/10/22 due to mid-sternal chest pain and shortness of breath. Labs normal.   Echo 02/18/14: EF 40-45% with Grade I DD Echo 02/22/17: EF 45-50% with Grade I DD Echo 03/24/19: EF 45-50% with mild LVH, Grade I DD, borderline aortic root dilatation 37 mm Echo 03/21/20: EF 40-45% with Grade I DD, borderline aortic root dilatation 37 mm Echo 10/03/22: EF 30-35% with moderate LVH, Grade I DD, mild LAE  LHC 03/28/20:  1.  Normal coronary arteries. 2.  Left ventricular angiography was not performed.  EF was mildly reduced by echo. 3.  Mildly elevated left ventricular end-diastolic pressure 20 mmHg.  She presents today for a HF follow-up visit with a chief complaint of moderate shortness of breath with minimal exertion. Chronic in nature although does feel like it worsened while she was on her cruise. Returned ~ 1 week ago but says that  symptoms have persisted. She says that walking in to the office from the parking lot, she noticed that she was more short of breath than before her cruise. She has associated fatigue, chronic ankle edema and occasional spasms in bilateral upper thighs. She denies chest pain, cough, palpitations, abdominal distention, dizziness or difficulty sleeping.   She was in [place] for 1 day prior to her cruise and then took the cruise. She says that she doesn't think she ate too much salt even while on the cruise. When her symptoms worsened on vacation, she also had some sweating along with her SOB, fatigue and leg spasms. Denies missing any medications. Did wear her compression socks while travelling.   At last visit, her spironolactone was increased to 25mg  daily. Has upcoming echo next week.    ROS: All systems negative except as listed in HPI, PMH and Problem List.  SH:  Social History   Socioeconomic History   Marital status: Married    Spouse name: Jimmy   Number of children: 2   Years of education: Not on file   Highest education level: Not on file  Occupational History   Not on file  Tobacco Use   Smoking status: Never   Smokeless tobacco: Never  Vaping Use   Vaping status: Never Used  Substance and Sexual Activity   Alcohol use: Yes    Alcohol/week: 0.0 standard drinks of alcohol    Comment: occassionally   Drug use: No   Sexual activity: Not Currently    Birth control/protection:  Surgical  Other Topics Concern   Not on file  Social History Narrative   Not on file   Social Determinants of Health   Financial Resource Strain: Low Risk  (01/08/2022)   Received from [facility], [facility]   Overall Financial Resource Strain (CARDIA)    Difficulty of Paying Living Expenses: Not hard at all  Food Insecurity: No Food Insecurity (11/02/2022)   Hunger Vital Sign    Worried About Running Out of Food in the Last Year: Never true    Ran Out of  Food in the Last Year: Never true  Transportation Needs: No Transportation Needs (11/02/2022)   PRAPARE - Administrator, Civil Service (Medical): No    Lack of Transportation (Non-Medical): No  Physical Activity: Not on file  Stress: Not on file  Social Connections: Not on file  Intimate Partner Violence: Not At Risk (11/02/2022)   Humiliation, Afraid, Rape, and Kick questionnaire    Fear of Current or Ex-Partner: No    Emotionally Abused: No    Physically Abused: No    Sexually Abused: No    FH:  Family History  Problem Relation Age of Onset   Hypertension Mother    Hyperlipidemia Mother    Stroke Mother    Colon cancer Mother    Breast cancer Sister 45   Ovarian cancer Sister    Throat cancer Brother     Past Medical History:  Diagnosis Date   AICD (automatic cardioverter/defibrillator) present 2008   a.) Medtronic device placed 2008. b.) replaced 06/21/2013 (Medtronic Evera XT single-chamber ICD; model number [id number]).   Anemia    Anxiety    Aortic dilatation (HCC) 03/24/2019   a.) TTE 03/24/2019: Ao root measured 37 mm. b.) TTE 03/21/2020: Ao root measured 37 mm.   Arthritis    Depression    Difficult intubation    DOE (dyspnea on exertion)    Endometriosis of vagina 09/2013   Intra-operative findings of endometriosis implants on cervical stump   Essential hypertension    GERD (gastroesophageal reflux disease)    Headache    HFrEF (heart failure with reduced ejection fraction) (HCC)    a.) TTE 02/18/2014: EF 40-45%; tiv AR, mild MR/TR/PR; G1DD. b.) TTE 02/22/2017: EF 45-50%; mild LVH; triv PR; G1DD. c.) TTE 03/24/2019: EF 45-50%; glob HK, mild LVH; triv MR/TR/PR. d.) TTE 03/21/2020: EF 40-45%; glob HK.   History of 2019 novel coronavirus disease (COVID-19) 02/02/2020   History of 2019 novel coronavirus disease (COVID-19) 02/02/2020   Hypokalemia    Hypothyroidism    a.) s/p radioactive iodine Tx; on levothyroxine   Mixed hyperlipidemia     Nonischemic cardiomyopathy (HCC)    a.) TTE 02/18/2014: EF 40-45%. b.) TTE 02/22/2017: EF 45-50%. c.) TTE 03/24/2019: EF 45-50%. d.) TTE 03/21/2020: EF 40-45%   OSA on CPAP    Panic attacks    PSVT (paroxysmal supraventricular tachycardia) (HCC) 03/30/2020   a.) Holter 03/30/2020 --> 2 runs; fastest lasting 4 beats (184 bpm); longest lasting 4 beats (113 bpm)   Tachycardia     Current Outpatient Medications  Medication Sig Dispense Refill   Acetaminophen (TYLENOL ARTHRITIS PAIN PO) Take 1,300 mg by mouth every 6 (six) hours as needed (Pain).     apixaban (ELIQUIS) 5 MG TABS tablet Take 5 mg by mouth 2 (two) times daily.     cyclobenzaprine (FLEXERIL) 10 MG tablet Take 1 tablet (10 mg total) by mouth 3 (three) times daily as  needed for muscle spasms.     ENTRESTO 24-26 MG Take 1 tablet by mouth 2 (two) times daily.     esomeprazole (NEXIUM) 40 MG capsule Take 40 mg by mouth every morning.      furosemide (LASIX) 20 MG tablet Take 20 mg by mouth daily.     gabapentin (NEURONTIN) 100 MG capsule Take 100 mg by mouth 3 (three) times daily.     levothyroxine (SYNTHROID) 125 MCG tablet Take 125 mcg by mouth every morning.     Magnesium 250 MG TABS Take 1 tablet by mouth daily.     metoprolol succinate (TOPROL-XL) 50 MG 24 hr tablet TAKE 1 TABLET BY MOUTH ONCE DAILY WITH  OR  IMMEDIATELY  FOLLOWING  A  MEAL 90 tablet 0   oxyCODONE-acetaminophen (PERCOCET/ROXICET) 5-325 MG tablet Take 1 tablet by mouth every 4 (four) hours as needed for severe pain.     Semaglutide-Weight Management 0.25 MG/0.5ML SOAJ Inject 0.25 mg into the skin once a week. For 4 weeks (Patient not taking: Reported on 11/19/2022) 2 mL 0   Semaglutide-Weight Management 0.5 MG/0.5ML SOAJ Inject 0.5 mg into the skin once a week for 28 days. 2 mL 0   [START ON 12/27/2022] Semaglutide-Weight Management 1 MG/0.5ML SOAJ Inject 1 mg into the skin once a week for 28 days. 2 mL 0   [START ON 01/25/2023] Semaglutide-Weight Management 1.7  MG/0.75ML SOAJ Inject 1.7 mg into the skin once a week for 28 days. (Patient not taking: Reported on 11/19/2022) 3 mL 0   [START ON 02/23/2023] Semaglutide-Weight Management 2.4 MG/0.75ML SOAJ Inject 2.4 mg into the skin once a week for 28 days. (Patient not taking: Reported on 11/19/2022) 3 mL 0   sertraline (ZOLOFT) 100 MG tablet Take 200 mg by mouth daily. (Patient not taking: Reported on 11/20/2022)     spironolactone (ALDACTONE) 25 MG tablet Take 1 tablet (25 mg total) by mouth daily. 90 tablet 3   No current facility-administered medications for this visit.   Vitals:   12/07/22 1319  BP: 117/81  Pulse: 89  SpO2: 99%  Weight: 252 lb 2 oz (114.4 kg)   Wt Readings from Last 3 Encounters:  12/07/22 252 lb 2 oz (114.4 kg)  11/20/22 255 lb 3.2 oz (115.8 kg)  11/19/22 256 lb (116.1 kg)   Lab Results  Component Value Date   CREATININE 0.88 10/10/2022   CREATININE 0.85 10/04/2022   CREATININE 0.75 10/03/2022   PHYSICAL EXAM:  General:  Well appearing. No resp difficulty HEENT: normal Neck: supple. JVP flat. No lymphadenopathy or thryomegaly appreciated. Cor: PMI normal. Regular rate & rhythm. No rubs, gallops or murmurs. Lungs: clear Abdomen: soft, nontender, nondistended. No hepatosplenomegaly. No bruits or masses.  Extremities: no cyanosis, clubbing, rash, trace pitting edema around bilateral ankles Neuro: alert & oriented x3, cranial nerves grossly intact. Moves all 4 extremities w/o difficulty. Affect pleasant.   ECG: not done  ReDs: 33%   ASSESSMENT & PLAN:  1. NICM with reduced ejection fraction- - cath 2003 and 2022 no CAD - nuclear stress test on 10/03/2022 EF 44% with moderate fixed basal septal defect without evidence of ischemia.  - NYHA IIB - euvolemic - weighing daily - weight down 4 pounds from last visit here 2 weeks ago - ReDs: 33% - Echo 02/18/14: EF 40-45% with Grade I DD - Echo 02/22/17: EF 45-50% with Grade I DD - Echo 03/24/19: EF 45-50% with mild  LVH, Grade I DD, borderline aortic root dilatation 37  mm  -Echo 03/21/20: EF 40-45% with Grade I DD, borderline aortic root dilatation 37 mm - Echo 10/03/22: EF 30-35% with moderate LVH, Grade I DD, mild LAE - continue entresto 24/26mg  BID - continue lasix 20mg  every other day - continue spironolactone 25mg  daily - continue metoprolol succinate 50mg  daily.  - BMET today as spironolactone was increased at last visit - will hold off on jardiance; recent yeast infection - device interrogated and showed possible optivol fluid accumulation during the time of her cruise; most likely she had more sodium in her diet while on vacation since she was on a cruise  - BNP 07/21/11 was 387  2. RV Failure- - TTE from 10/03/22 with at least moderately reduced RV function 2/2 acute PE; suspect this has improved after thrombectomy.  - TTE scheduled for 12/13/22   3. H/o VT- - s/p MDT ICD - device interrogated and no VF/ VT episodes  4. Recurrent DVT/PE- - s/p thrombectomy - has echo scheduled for 12/13/22 - plan for RHC if there are sign of RV dysfunction and/or PH after repeat TTE.  - continue lifelong Eliquis - saw cardiology (Agbor-Etang) 10/24  5. Morbid obesity- - Body mass index is 40.1 kg/m. - Continue GLP1RA [facility])  6: HTN- - BP 117/81 - saw PCP Burnett Sheng) 09/24 - BMP 10/10/22 showed sodium 137, potassium 4.2, creatinine 0.88 & GFR >60 - BMET today  Keep already scheduled HF appt in 2 weeks.

## 2022-12-10 ENCOUNTER — Other Ambulatory Visit: Payer: Self-pay

## 2022-12-10 ENCOUNTER — Telehealth: Payer: Self-pay | Admitting: Cardiology

## 2022-12-10 MED ORDER — SEMAGLUTIDE-WEIGHT MANAGEMENT 0.25 MG/0.5ML ~~LOC~~ SOAJ: 0.2500 mg | SUBCUTANEOUS | Status: DC

## 2022-12-10 MED ORDER — SEMAGLUTIDE-WEIGHT MANAGEMENT 0.5 MG/0.5ML ~~LOC~~ SOAJ: 0.5000 mg | SUBCUTANEOUS | 0 refills | Status: AC

## 2022-12-10 NOTE — Telephone Encounter (Signed)
  Pt c/o medication issue:  1. Name of Medication: Semaglutide-Weight Management 0.5 MG/0.5ML SOAJ   2. How are you currently taking this medication (dosage and times per day)? Inject 0.5 mg into the skin once a week for 28 days.   3. Are you having a reaction (difficulty breathing--STAT)? No   4. What is your medication issue? Pt said, would like to request if this medication can be sent to her [place], since her insurance keep rejecting her PA

## 2022-12-10 NOTE — Telephone Encounter (Signed)
RX sent to [facility] med[facility], to see if she would be able to get it cheaper.   Will route to covering CMA as FYI.  Thanks!

## 2022-12-11 DIAGNOSIS — I420 Dilated cardiomyopathy: Secondary | ICD-10-CM | POA: Diagnosis not present

## 2022-12-11 LAB — BASIC METABOLIC PANEL
BUN/Creatinine Ratio: 15 (ref 12–28)
BUN: 14 mg/dL (ref 8–27)
CO2: 22 mmol/L (ref 20–29)
Calcium: 10.1 mg/dL (ref 8.7–10.3)
Chloride: 101 mmol/L (ref 96–106)
Creatinine, Ser: 0.95 mg/dL (ref 0.57–1.00)
Glucose: 83 mg/dL (ref 70–99)
Potassium: 4.4 mmol/L (ref 3.5–5.2)
Sodium: 138 mmol/L (ref 134–144)
eGFR: 69 mL/min/{1.73_m2} (ref >59)

## 2022-12-12 ENCOUNTER — Ambulatory Visit
Admission: RE | Admit: 2022-12-12 | Discharge: 2022-12-12 | Disposition: A | Discharge status: Home / Self Care | Payer: Medicare HMO | Source: Ambulatory Visit | Attending: Family Medicine | Admitting: Family Medicine

## 2022-12-12 DIAGNOSIS — Z1231 Encounter for screening mammogram for malignant neoplasm of breast: Secondary | ICD-10-CM | POA: Diagnosis not present

## 2022-12-12 DIAGNOSIS — I5042 Chronic combined systolic (congestive) and diastolic (congestive) heart failure: Secondary | ICD-10-CM | POA: Diagnosis not present

## 2022-12-13 ENCOUNTER — Ambulatory Visit (HOSPITAL_BASED_OUTPATIENT_CLINIC_OR_DEPARTMENT_OTHER)
Admission: RE | Admit: 2022-12-13 | Discharge: 2022-12-13 | Disposition: A | Discharge status: Home / Self Care | Payer: Medicare HMO | Source: Ambulatory Visit | Attending: Cardiology | Admitting: Cardiology

## 2022-12-13 DIAGNOSIS — I5042 Chronic combined systolic (congestive) and diastolic (congestive) heart failure: Secondary | ICD-10-CM

## 2022-12-13 DIAGNOSIS — Z1231 Encounter for screening mammogram for malignant neoplasm of breast: Secondary | ICD-10-CM | POA: Diagnosis not present

## 2022-12-13 LAB — ECHOCARDIOGRAM COMPLETE
AR max vel: 2.35 cm2
AV Area VTI: 2.58 cm2
AV Area mean vel: 2.09 cm2
AV Mean grad: 2 mm[Hg]
AV Peak grad: 3 mm[Hg]
Ao pk vel: 0.87 m/s
Area-P 1/2: 5.54 cm2
Calc EF: 29.9 %
MV VTI: 2.3 cm2
S' Lateral: 4.8 cm
Single Plane A2C EF: 36.7 %
Single Plane A4C EF: 27 %

## 2022-12-13 NOTE — Progress Notes (Signed)
*  PRELIMINARY RESULTS* Echocardiogram 2D Echocardiogram has been performed.  Cristela Blue 12/13/2022, 11:34 AM

## 2022-12-14 ENCOUNTER — Encounter: Payer: Self-pay | Admitting: Emergency Medicine

## 2022-12-14 ENCOUNTER — Other Ambulatory Visit: Payer: Self-pay

## 2022-12-14 ENCOUNTER — Emergency Department: Payer: Medicare HMO

## 2022-12-14 ENCOUNTER — Emergency Department
Admission: EM | Admit: 2022-12-14 | Discharge: 2022-12-14 | Disposition: A | Discharge status: Home / Self Care | Payer: Medicare HMO | Attending: Emergency Medicine | Admitting: Emergency Medicine

## 2022-12-14 DIAGNOSIS — Z7901 Long term (current) use of anticoagulants: Secondary | ICD-10-CM | POA: Insufficient documentation

## 2022-12-14 DIAGNOSIS — Z86711 Personal history of pulmonary embolism: Secondary | ICD-10-CM | POA: Insufficient documentation

## 2022-12-14 DIAGNOSIS — Z95810 Presence of automatic (implantable) cardiac defibrillator: Secondary | ICD-10-CM | POA: Diagnosis not present

## 2022-12-14 DIAGNOSIS — M79605 Pain in left leg: Secondary | ICD-10-CM | POA: Insufficient documentation

## 2022-12-14 DIAGNOSIS — I110 Hypertensive heart disease with heart failure: Secondary | ICD-10-CM | POA: Insufficient documentation

## 2022-12-14 DIAGNOSIS — R600 Localized edema: Secondary | ICD-10-CM | POA: Diagnosis not present

## 2022-12-14 DIAGNOSIS — R0789 Other chest pain: Secondary | ICD-10-CM | POA: Insufficient documentation

## 2022-12-14 DIAGNOSIS — R918 Other nonspecific abnormal finding of lung field: Secondary | ICD-10-CM | POA: Diagnosis not present

## 2022-12-14 DIAGNOSIS — I5042 Chronic combined systolic (congestive) and diastolic (congestive) heart failure: Secondary | ICD-10-CM | POA: Diagnosis not present

## 2022-12-14 DIAGNOSIS — M79662 Pain in left lower leg: Secondary | ICD-10-CM | POA: Diagnosis not present

## 2022-12-14 DIAGNOSIS — Z950 Presence of cardiac pacemaker: Secondary | ICD-10-CM | POA: Diagnosis not present

## 2022-12-14 HISTORY — DX: Other pulmonary embolism without acute cor pulmonale: I26.99

## 2022-12-14 LAB — BASIC METABOLIC PANEL
Anion gap: 9 (ref 5–15)
BUN: 14 mg/dL (ref 6–20)
CO2: 26 mmol/L (ref 22–32)
Calcium: 9.6 mg/dL (ref 8.9–10.3)
Chloride: 103 mmol/L (ref 98–111)
Creatinine, Ser: 0.89 mg/dL (ref 0.44–1.00)
GFR, Estimated: >60 mL/min (ref >60)
Glucose, Bld: 84 mg/dL (ref 70–99)
Potassium: 3.8 mmol/L (ref 3.5–5.1)
Sodium: 138 mmol/L (ref 135–145)

## 2022-12-14 LAB — CBC WITH DIFFERENTIAL/PLATELET
Abs Immature Granulocytes: 0.01 10*3/uL (ref 0.00–0.07)
Basophils Absolute: 0 10*3/uL (ref 0.0–0.1)
Basophils Relative: 1 %
Eosinophils Absolute: 0 10*3/uL (ref 0.0–0.5)
Eosinophils Relative: 1 %
HCT: 43.5 % (ref 36.0–46.0)
Hemoglobin: 14.2 g/dL (ref 12.0–15.0)
Immature Granulocytes: 0 %
Lymphocytes Relative: 39 %
Lymphs Abs: 1.8 10*3/uL (ref 0.7–4.0)
MCH: 30.3 pg (ref 26.0–34.0)
MCHC: 32.6 g/dL (ref 30.0–36.0)
MCV: 92.8 fL (ref 80.0–100.0)
Monocytes Absolute: 0.3 10*3/uL (ref 0.1–1.0)
Monocytes Relative: 7 %
Neutro Abs: 2.4 10*3/uL (ref 1.7–7.7)
Neutrophils Relative %: 52 %
Platelets: 156 10*3/uL (ref 150–400)
RBC: 4.69 MIL/uL (ref 3.87–5.11)
RDW: 14 % (ref 11.5–15.5)
WBC: 4.6 10*3/uL (ref 4.0–10.5)
nRBC: 0 % (ref 0.0–0.2)

## 2022-12-14 LAB — HEPATIC FUNCTION PANEL
ALT: 26 U/L (ref 0–44)
AST: 25 U/L (ref 15–41)
Albumin: 4.3 g/dL (ref 3.5–5.0)
Alkaline Phosphatase: 63 U/L (ref 38–126)
Bilirubin, Direct: 0.2 mg/dL (ref 0.0–0.2)
Indirect Bilirubin: 0.6 mg/dL (ref 0.3–0.9)
Total Bilirubin: 0.8 mg/dL (ref <1.2)
Total Protein: 7.8 g/dL (ref 6.5–8.1)

## 2022-12-14 LAB — LIPASE, BLOOD: Lipase: 28 U/L (ref 11–51)

## 2022-12-14 MED ORDER — METHOCARBAMOL 500 MG PO TABS: 500.0000 mg | ORAL_TABLET | Freq: Four times a day (QID) | ORAL | 0 refills | Status: DC

## 2022-12-14 MED ORDER — IOHEXOL 350 MG/ML SOLN
75.0000 mL | Freq: Once | INTRAVENOUS | Status: AC | PRN
  Administered 2022-12-14: 75 mL via INTRAVENOUS

## 2022-12-14 MED ORDER — PREDNISONE 50 MG PO TABS: 50.0000 mg | ORAL_TABLET | Freq: Every day | ORAL | 0 refills | Status: DC

## 2022-12-14 NOTE — ED Provider Notes (Signed)
[facility] Provider Note    Event Date/Time   First MD Initiated Contact with Patient 12/14/22 1043     (approximate)   History   No chief complaint on file.   HPI  Lisa Fuentes is a 62 y.o. female with a past medical history of multiple blood clots including leg, arm, and pulmonary embolism on Eliquis who presents today for evaluation of left leg pain.  Patient reports that this began last night.  She still able to ambulate.  She has not had any injuries.  She has not noticed any swelling different from baseline or discoloration.  She has not had any abdominal pain.  She reports that she had mild chest discomfort yesterday during her echocardiogram but did not have any pain currently.  Patient Active Problem List   Diagnosis Date Noted   Swelling of limb 11/21/2022   Pulmonary emboli (HCC) 10/03/2022   Elevated troponin 10/03/2022   Chest pain 10/02/2022   Abnormal cardiovascular stress test    Anxiety 05/27/2019   Chronic pain syndrome 05/27/2019   Depression 05/27/2019   Morbid obesity (HCC) 03/03/2019   Tibialis posterior tendinitis 08/22/2017   Chest pain of uncertain etiology 09/10/2016   Mixed hyperlipidemia 09/10/2016   Low back pain 03/22/2016   Family history of malignant neoplasm of gastrointestinal tract    Dizziness 03/27/2015   Postoperative state 11/15/2014   LV dysfunction 10/29/2014   Knee pain 10/22/2014   Endometriosis 09/18/2014   ICD (implantable cardioverter-defibrillator) in place 02/02/2014   Chronic combined systolic (congestive) and diastolic (congestive) heart failure (HCC)    Essential hypertension    Hypercholesteremia    Sleep apnea 05/12/2013   Hyperthyroidism 05/08/2013   Dilated cardiomyopathy (HCC) 05/08/2013          Physical Exam   Triage Vital Signs: ED Triage Vitals  Encounter Vitals Group     BP 12/14/22 1004 106/81     Systolic BP Percentile --      Diastolic BP Percentile --      Pulse  Rate 12/14/22 1004 (!) 111     Resp 12/14/22 1004 16     Temp 12/14/22 1004 97.8 F (36.6 C)     Temp Source 12/14/22 1004 Oral     SpO2 12/14/22 1004 94 %     Weight 12/14/22 1000 251 lb 5.2 oz (114 kg)     Height --      Head Circumference --      Peak Flow --      Pain Score 12/14/22 1000 8     Pain Loc --      Pain Education --      Exclude from Growth Chart --     Most recent vital signs: Vitals:   12/14/22 1004 12/14/22 1455  BP: 106/81 117/85  Pulse: (!) 111 76  Resp: 16 14  Temp: 97.8 F (36.6 C)   SpO2: 94% 98%    Physical Exam Vitals and nursing note reviewed.  Constitutional:      General: Awake and alert. No acute distress.    Appearance: Normal appearance. The patient is obese.  HENT:     Head: Normocephalic and atraumatic.     Mouth: Mucous membranes are moist.  Eyes:     General: PERRL. Normal EOMs        Right eye: No discharge.        Left eye: No discharge.     Conjunctiva/sclera: Conjunctivae normal.  Cardiovascular:  Rate and Rhythm: Normal rate and regular rhythm.     Pulses: Normal pulses.  Pulmonary:     Effort: Pulmonary effort is normal. No respiratory distress.     Breath sounds: Normal breath sounds.  Abdominal:     Abdomen is soft. There is no abdominal tenderness. No rebound or guarding. No distention. Musculoskeletal:        General: No swelling. Normal range of motion.     Cervical back: Normal range of motion and neck supple.  Skin:    General: Skin is warm and dry.     Capillary Refill: Capillary refill takes less than 2 seconds.     Findings: No rash.  Neurological:     Mental Status: The patient is awake and alert.      ED Results / Procedures / Treatments   Labs (all labs ordered are listed, but only abnormal results are displayed) Labs Reviewed  CBC WITH DIFFERENTIAL/PLATELET  BASIC METABOLIC PANEL  HEPATIC FUNCTION PANEL  LIPASE, BLOOD     EKG     RADIOLOGY I independently reviewed and interpreted  imaging and agree with radiologists findings.     PROCEDURES:  Critical Care performed:   Procedures   MEDICATIONS ORDERED IN ED: Medications  iohexol (OMNIPAQUE) 350 MG/ML injection 75 mL (75 mLs Intravenous Contrast Given 12/14/22 1239)     IMPRESSION / MDM / ASSESSMENT AND PLAN / ED COURSE  I reviewed the triage vital signs and the nursing notes.   Differential diagnosis includes, but is not limited to, dependent edema, DVT, pulmonary embolus.  Patient is awake and alert, tachycardic to 111 with a oxygen saturation of 94% on room air.  She is nontoxic in appearance.  She was placed on the cardiac monitor.  I reviewed the patient's chart.  Patient was admitted in September of this year for pulmonary embolus and had a thrombectomy.  She is on full dose anticoagulation for at least 1 year.  She also has seen vascular for swelling of her legs bilaterally.  It was advised that she wear compression stockings.  Further workup is indicated.  Ultrasound was ordered in triage and is negative for DVT.  She has normal pedal pulses, normal range of motion of her leg, not consistent with acute arterial occlusion.  Patient agreed to labs and further imaging given her tachycardia and hypoxia.  Labs are overall reassuring.  CTA also obtained.  Patient passed off to oncoming provider Gala Romney PA-C pending CTA results and final disposition.  Patient's presentation is most consistent with acute presentation with potential threat to life or bodily function.   FINAL CLINICAL IMPRESSION(S) / ED DIAGNOSES   Final diagnoses:  Leg edema  Pain of left lower extremity     Rx / DC Orders   ED Discharge Orders     None        Note:  This document was prepared using Dragon voice recognition software and may include unintentional dictation errors.   Jackelyn Hoehn, PA-C 12/14/22 1529    Concha Se, MD 12/17/22 [phone]

## 2022-12-14 NOTE — ED Triage Notes (Addendum)
C/O left calf pain x 1 day Has history of PE.  Currently taking Eliquis.

## 2022-12-14 NOTE — ED Provider Notes (Signed)
-----------------------------------------   5:20 PM on 12/14/2022 -----------------------------------------  Blood pressure 117/85, pulse 76, temperature 97.8 F (36.6 C), temperature source Oral, resp. rate 14, height 5\' 7"  (1.702 m), weight 114 kg, SpO2 98%.  Assuming care from [facility] [place].  In short, IXEL GIGER is a 62 y.o. female with a chief complaint of No chief complaint on file. Marland Kitchen  Refer to the original H&P for additional details.  The current plan of care is to await CT scan.  Patient presents to the ED with complaints of leg pain.  She recently had symptoms in September consistent with same and had a large clot burden to the legs, as well as PEs.  Patient arrived tachycardic but had no chest pain or shortness of breath.  Ultrasound was negative for DVT,.  Patient with a negative CT PE chest.  Previously identified PEs have resolved.  At this time patient reports chronic back issues with pain and a distribution likely consistent with sciatica.  We discussed pain relief with steroids and muscle relaxer.  Patient is agreeable with this plan.  Concerning signs and symptoms warranting return are discussed.   ED diagnosis:  Left leg pain Peripheral edema   Racheal Patches, PA-C 12/14/22 1730    Minna Antis, MD 12/14/22 1818

## 2022-12-19 DIAGNOSIS — H4321 Crystalline deposits in vitreous body, right eye: Secondary | ICD-10-CM | POA: Diagnosis not present

## 2022-12-19 DIAGNOSIS — H2513 Age-related nuclear cataract, bilateral: Secondary | ICD-10-CM | POA: Diagnosis not present

## 2022-12-19 DIAGNOSIS — Z0100 Encounter for examination of eyes and vision without abnormal findings: Secondary | ICD-10-CM | POA: Diagnosis not present

## 2022-12-19 DIAGNOSIS — H25013 Cortical age-related cataract, bilateral: Secondary | ICD-10-CM | POA: Diagnosis not present

## 2022-12-19 DIAGNOSIS — H524 Presbyopia: Secondary | ICD-10-CM | POA: Diagnosis not present

## 2022-12-20 ENCOUNTER — Ambulatory Visit: Payer: Medicare HMO | Attending: Cardiology | Admitting: Cardiology

## 2022-12-20 VITALS — BP 98/87 | HR 83 | Wt 253.0 lb

## 2022-12-20 DIAGNOSIS — I10 Essential (primary) hypertension: Secondary | ICD-10-CM | POA: Diagnosis not present

## 2022-12-20 DIAGNOSIS — I5042 Chronic combined systolic (congestive) and diastolic (congestive) heart failure: Secondary | ICD-10-CM

## 2022-12-20 DIAGNOSIS — I50810 Right heart failure, unspecified: Secondary | ICD-10-CM | POA: Diagnosis not present

## 2022-12-20 NOTE — Patient Instructions (Signed)
Your provider requests you have a PYP scan.  They will call you to schedule your appointment  Follow up in 3 months with Dr.Bensimhon   Do the following things EVERYDAY: Weigh yourself in the morning before breakfast. Write it down and keep it in a log. Take your medicines as prescribed Eat low salt foods--Limit salt (sodium) to 2000 mg per day.  Stay as active as you can everyday Limit all fluids for the day to less than 2 liters

## 2022-12-20 NOTE — Progress Notes (Signed)
ADVANCED HF CLINIC CONSULT NOTE   Primary Care: Jerl Mina, MD (last seen 09/24) Primary Cardiologist: Debbe Odea, MD (last seen 10/24) HF provider: Dorthula Nettles, MD (last seen 10/24)  HPI:  Lisa Fuentes is a 62 y.o. female with a hx of systolic HF due to NICM, VT s/p MDT ICD morbid obesity, hypertension, PE s/p endovascular thrombolysis 9/24 on Eliquis, OSA and RUE DVT (06/23). Diagnosed with systolic HF in 2000. Cath 2003 no CAD. Had repeat cath 2022 with normal cors.  Had RUE DVT 6/23 after trauma. Upper extremity u/s with multiple RUE DVTs. CTA chest LLL segmental and subsegmental pulmonary emboli without evidence of RV strain. Started on Eliquis. She completed several months of Eliquis, and it was then discontinued per hematology.   Admitted [facility] 10/01/2022. CTA chest with moderate volume of acute PE with RV strain. She was started on heparin. She underwent pulmonary thrombectomy. D/c'd on Eliquis. Nuclear stress test on 10/03/2022,  LVEF 44% with moderate fixed basal septal defect without ischemia. Echo 10/03/22 EF 30-35% mild TR. Was in the ED 10/10/22 due to mid-sternal chest pain and shortness of breath. Labs normal.   Echo 02/18/14: EF 40-45% with Grade I DD Echo 02/22/17: EF 45-50% with Grade I DD Echo 03/24/19: EF 45-50% with mild LVH, Grade I DD, borderline aortic root dilatation 37 mm Echo 03/21/20: EF 40-45% with Grade I DD, borderline aortic root dilatation 37 mm Echo 10/03/22: EF 30-35% with moderate LVH, Grade I DD, mild LAE  LHC 03/28/20:  1.  Normal coronary arteries. 2.  Left ventricular angiography was not performed.  EF was mildly reduced by echo. 3.  Mildly elevated left ventricular end-diastolic pressure 20 mmHg.  She presents today for follow up. She reports that her shortness of breath and lower extremity edema has improved.   ROS: All systems negative except as listed in HPI, PMH and Problem List.  SH:  Social History   Socioeconomic History    Marital status: Married    Spouse name: Jimmy   Number of children: 2   Years of education: Not on file   Highest education level: Not on file  Occupational History   Not on file  Tobacco Use   Smoking status: Never   Smokeless tobacco: Never  Vaping Use   Vaping status: Never Used  Substance and Sexual Activity   Alcohol use: Yes    Alcohol/week: 0.0 standard drinks of alcohol    Comment: occassionally   Drug use: No   Sexual activity: Not Currently    Birth control/protection: Surgical  Other Topics Concern   Not on file  Social History Narrative   Not on file   Social Determinants of Health   Financial Resource Strain: Low Risk  (01/08/2022)   Received from [facility], [facility]   Overall Financial Resource Strain (CARDIA)    Difficulty of Paying Living Expenses: Not hard at all  Food Insecurity: No Food Insecurity (11/02/2022)   Hunger Vital Sign    Worried About Running Out of Food in the Last Year: Never true    Ran Out of Food in the Last Year: Never true  Transportation Needs: No Transportation Needs (11/02/2022)   PRAPARE - Administrator, Civil Service (Medical): No    Lack of Transportation (Non-Medical): No  Physical Activity: Not on file  Stress: Not on file  Social Connections: Not on file  Intimate Partner Violence: Not At Risk (11/02/2022)   Humiliation,  Afraid, Rape, and Kick questionnaire    Fear of Current or Ex-Partner: No    Emotionally Abused: No    Physically Abused: No    Sexually Abused: No    FH:  Family History  Problem Relation Age of Onset   Hypertension Mother    Hyperlipidemia Mother    Stroke Mother    Colon cancer Mother    Breast cancer Sister 58   Ovarian cancer Sister    Throat cancer Brother     Past Medical History:  Diagnosis Date   AICD (automatic cardioverter/defibrillator) present 2008   a.) Medtronic device placed 2008. b.) replaced 06/21/2013 (Medtronic Evera XT  single-chamber ICD; model number [id number]).   Anemia    Anxiety    Aortic dilatation (HCC) 03/24/2019   a.) TTE 03/24/2019: Ao root measured 37 mm. b.) TTE 03/21/2020: Ao root measured 37 mm.   Arthritis    Depression    Difficult intubation    DOE (dyspnea on exertion)    Endometriosis of vagina 09/2013   Intra-operative findings of endometriosis implants on cervical stump   Essential hypertension    GERD (gastroesophageal reflux disease)    Headache    HFrEF (heart failure with reduced ejection fraction) (HCC)    a.) TTE 02/18/2014: EF 40-45%; tiv AR, mild MR/TR/PR; G1DD. b.) TTE 02/22/2017: EF 45-50%; mild LVH; triv PR; G1DD. c.) TTE 03/24/2019: EF 45-50%; glob HK, mild LVH; triv MR/TR/PR. d.) TTE 03/21/2020: EF 40-45%; glob HK.   History of 2019 novel coronavirus disease (COVID-19) 02/02/2020   History of 2019 novel coronavirus disease (COVID-19) 02/02/2020   Hypokalemia    Hypothyroidism    a.) s/p radioactive iodine Tx; on levothyroxine   Mixed hyperlipidemia    Nonischemic cardiomyopathy (HCC)    a.) TTE 02/18/2014: EF 40-45%. b.) TTE 02/22/2017: EF 45-50%. c.) TTE 03/24/2019: EF 45-50%. d.) TTE 03/21/2020: EF 40-45%   OSA on CPAP    Panic attacks    PSVT (paroxysmal supraventricular tachycardia) (HCC) 03/30/2020   a.) Holter 03/30/2020 --> 2 runs; fastest lasting 4 beats (184 bpm); longest lasting 4 beats (113 bpm)   Pulmonary embolism (HCC)    Tachycardia     Current Outpatient Medications  Medication Sig Dispense Refill   Acetaminophen (TYLENOL ARTHRITIS PAIN PO) Take 1,300 mg by mouth every 6 (six) hours as needed (Pain).     apixaban (ELIQUIS) 5 MG TABS tablet Take 5 mg by mouth 2 (two) times daily.     cyclobenzaprine (FLEXERIL) 10 MG tablet Take 1 tablet (10 mg total) by mouth 3 (three) times daily as needed for muscle spasms.     ENTRESTO 24-26 MG Take 1 tablet by mouth 2 (two) times daily.     esomeprazole (NEXIUM) 40 MG capsule Take 40 mg by mouth every  morning.      furosemide (LASIX) 20 MG tablet Take 20 mg by mouth every other day.     gabapentin (NEURONTIN) 100 MG capsule Take 100 mg by mouth 3 (three) times daily.     levothyroxine (SYNTHROID) 125 MCG tablet Take 125 mcg by mouth every morning.     Magnesium 250 MG TABS Take 1 tablet by mouth daily.     methocarbamol (ROBAXIN) 500 MG tablet Take 1 tablet (500 mg total) by mouth 4 (four) times daily. 20 tablet 0   metoprolol succinate (TOPROL-XL) 50 MG 24 hr tablet TAKE 1 TABLET BY MOUTH ONCE DAILY WITH  OR  IMMEDIATELY  FOLLOWING  A  MEAL 90  tablet 0   oxyCODONE-acetaminophen (PERCOCET/ROXICET) 5-325 MG tablet Take 1 tablet by mouth every 4 (four) hours as needed for severe pain.     predniSONE (DELTASONE) 50 MG tablet Take 1 tablet (50 mg total) by mouth daily with breakfast. 5 tablet 0   Semaglutide-Weight Management 0.25 MG/0.5ML SOAJ Inject 0.25 mg into the skin once a week. For 4 weeks     Semaglutide-Weight Management 0.5 MG/0.5ML SOAJ Inject 0.5 mg into the skin once a week for 28 days. 2 mL 0   [START ON 12/27/2022] Semaglutide-Weight Management 1 MG/0.5ML SOAJ Inject 1 mg into the skin once a week for 28 days. 2 mL 0   [START ON 01/25/2023] Semaglutide-Weight Management 1.7 MG/0.75ML SOAJ Inject 1.7 mg into the skin once a week for 28 days. (Patient not taking: Reported on 11/19/2022) 3 mL 0   [START ON 02/23/2023] Semaglutide-Weight Management 2.4 MG/0.75ML SOAJ Inject 2.4 mg into the skin once a week for 28 days. 3 mL 0   sertraline (ZOLOFT) 100 MG tablet Take 200 mg by mouth daily.     spironolactone (ALDACTONE) 25 MG tablet Take 1 tablet (25 mg total) by mouth daily. 90 tablet 3   No current facility-administered medications for this visit.   There were no vitals filed for this visit.  Wt Readings from Last 3 Encounters:  12/14/22 251 lb 5.2 oz (114 kg)  12/07/22 252 lb 2 oz (114.4 kg)  11/20/22 255 lb 3.2 oz (115.8 kg)   Lab Results  Component Value Date   CREATININE  0.89 12/14/2022   CREATININE 0.95 12/07/2022   CREATININE 0.88 10/10/2022   PHYSICAL EXAM: Vitals:   12/20/22 1417  BP: 98/87  Pulse: 83  SpO2: 100%   GENERAL: Well nourished, well developed, and in no apparent distress at rest.  HEENT: Negative for arcus senilis or xanthelasma. There is no scleral icterus.  The mucous membranes are pink and moist.   NECK: Supple, No masses. Normal carotid upstrokes without bruits. No masses or thyromegaly.    CHEST: There are no chest wall deformities. There is no chest wall tenderness. Respirations are unlabored.  Lungs- CTA B/L CARDIAC:  JVP: less than 7 cm          Normal rate with regular rhythm. No murmurs, rubs or gallops.  Pulses are 2+ and symmetrical in upper and lower extremities. No edema.  ABDOMEN: Soft, non-tender, non-distended. There are no masses or hepatomegaly. There are normal bowel sounds.  EXTREMITIES: Warm and well perfused with no cyanosis, clubbing.  LYMPHATIC: No axillary or supraclavicular lymphadenopathy.  NEUROLOGIC: Patient is oriented x3 with no focal or lateralizing neurologic deficits.  PSYCH: Patients affect is appropriate, there is no evidence of anxiety or depression.  SKIN: Warm and dry; no lesions or wounds.   ASSESSMENT & PLAN:  1. NICM with reduced ejection fraction- - cath 2003 and 2022 no CAD - nuclear stress test on 10/03/2022 EF 44% with moderate fixed basal septal defect without evidence of ischemia.  - NYHA IIB - euvolemic - Echo 02/18/14: EF 40-45% with Grade I DD - Echo 02/22/17: EF 45-50% with Grade I DD - Echo 03/24/19: EF 45-50% with mild LVH, Grade I DD, borderline aortic root dilatation 37 mm  -Echo 03/21/20: EF 40-45% with Grade I DD, borderline aortic root dilatation 37 mm - Echo 10/03/22: EF 30-35% with moderate LVH, Grade I DD, mild LAE - 12/13/22: LVEF 30%, normal RV function.  - continue entresto 24/26mg  BID - continues to take  lasix 20mg  every other day; euvolemic on exam today.  - continue  spironolactone 25mg  daily - continue metoprolol succinate 50mg  daily; did not take meds this morning. HR previously on ECG in the 60s.   - will continue to hold SGLT2i; she reports having a very severe - device interrogated and showed possible optivol fluid accumulation during the time of her cruise; most likely she had more sodium in her diet while on vacation since she was on a cruise  - She has had carpal tunnel in both hands now s/p surgical repair; although, her TTE does not appear like a typical case of amyloid, will obtain PYP. She is concerned about having ATTR amyloid after reading about the disease.   2. RV Failure- - TTE from 10/03/22 with at least moderately reduced RV function 2/2 acute PE; suspect this has improved after thrombectomy.  - TTE 12/13/22 w/ LVEF 25%-30%, with low normal RV function now. Euvolemic on exam with no clinical signs of RV failure  3. H/o VT- - s/p MDT ICD - No recent VT/VF.   4. Recurrent DVT/PE- - s/p thrombectomy - has echo scheduled for 12/13/22 - plan for RHC if there are sign of RV dysfunction and/or PH after repeat TTE.  - continue lifelong Eliquis - saw cardiology (Agbor-Etang) 10/24  5. Morbid obesity- - Body mass index is Body mass index is 39.63 kg/m. - Continue GLP1RA [facility])  6: HTN- - BP at goal today - Reviewed BMP from 12/14/22; sCr within normal limits.   Follow up with Dr. Gala Romney in 3months.   Aly Hauser 3:08 PM

## 2022-12-31 ENCOUNTER — Telehealth: Payer: Self-pay | Admitting: *Deleted

## 2023-01-04 DIAGNOSIS — G8929 Other chronic pain: Secondary | ICD-10-CM | POA: Diagnosis not present

## 2023-01-04 DIAGNOSIS — M65872 Other synovitis and tenosynovitis, left ankle and foot: Secondary | ICD-10-CM | POA: Diagnosis not present

## 2023-01-04 DIAGNOSIS — M25572 Pain in left ankle and joints of left foot: Secondary | ICD-10-CM | POA: Diagnosis not present

## 2023-01-09 ENCOUNTER — Encounter
Admission: RE | Admit: 2023-01-09 | Discharge: 2023-01-09 | Disposition: A | Discharge status: Home / Self Care | Payer: Medicare HMO | Source: Ambulatory Visit | Attending: Cardiology | Admitting: Cardiology

## 2023-01-09 DIAGNOSIS — I5042 Chronic combined systolic (congestive) and diastolic (congestive) heart failure: Secondary | ICD-10-CM | POA: Insufficient documentation

## 2023-01-09 MED ORDER — TECHNETIUM TC 99M TETROFOSMIN IV KIT
31.5900 | PACK | Freq: Once | INTRAVENOUS | Status: AC | PRN
  Administered 2023-01-09: 31.59 via INTRAVENOUS

## 2023-01-09 MED ORDER — REGADENOSON 0.4 MG/5ML IV SOLN
0.4000 mg | Freq: Once | INTRAVENOUS | Status: AC
  Administered 2023-01-09: 0.4 mg via INTRAVENOUS

## 2023-01-09 MED ORDER — TECHNETIUM TC 99M TETROFOSMIN IV KIT
10.4100 | PACK | Freq: Once | INTRAVENOUS | Status: AC | PRN
  Administered 2023-01-09: 10.41 via INTRAVENOUS

## 2023-01-11 LAB — NM MYOCAR MULTI W/SPECT W/WALL MOTION / EF
LV dias vol: 149 mL (ref 46–106)
LV sys vol: 71 mL
MPHR: 160 {beats}/min
Nuc Stress EF: 52 %
Peak HR: 98 {beats}/min
Percent HR: 61 %
Rest HR: 72 {beats}/min
Rest Nuclear Isotope Dose: 10.4 mCi
SDS: 3
SRS: 5
SSS: 13
Stress Nuclear Isotope Dose: 31.6 mCi
TID: 0.93

## 2023-01-18 ENCOUNTER — Ambulatory Visit (INDEPENDENT_AMBULATORY_CARE_PROVIDER_SITE_OTHER): Payer: Medicare HMO

## 2023-01-18 ENCOUNTER — Ambulatory Visit (INDEPENDENT_AMBULATORY_CARE_PROVIDER_SITE_OTHER): Payer: Medicare HMO | Admitting: Vascular Surgery

## 2023-01-18 VITALS — BP 103/69 | HR 79 | Resp 16 | Wt 247.2 lb

## 2023-01-18 DIAGNOSIS — M7989 Other specified soft tissue disorders: Secondary | ICD-10-CM

## 2023-01-18 DIAGNOSIS — I10 Essential (primary) hypertension: Secondary | ICD-10-CM | POA: Diagnosis not present

## 2023-01-18 DIAGNOSIS — I2609 Other pulmonary embolism with acute cor pulmonale: Secondary | ICD-10-CM

## 2023-01-18 DIAGNOSIS — I83893 Varicose veins of bilateral lower extremities with other complications: Secondary | ICD-10-CM | POA: Diagnosis not present

## 2023-01-18 NOTE — Assessment & Plan Note (Signed)
A venous reflux study was performed today showing no evidence of DVT or superficial thrombophlebitis.  The right great saphenous vein remains ablated.  There is reflux in the right anterior accessory saphenous vein.  The left great saphenous vein has long segment reflux throughout. Recommend  I have reviewed my previous  discussion with the patient regarding  varicose veins and why they cause symptoms. Patient will continue  wearing graduated compression stockings class 1 on a daily basis, beginning first thing in the morning and removing them in the evening.  The patient is CEAP [id number].  The patient has been wearing compression for more than 12 weeks with no or little benefit.  The patient has been exercising daily for more than 12 weeks. The patient has been elevating and taking OTC pain medications for more than 12 weeks.  None of these have have eliminated the pain related to the varicose veins and venous reflux or the discomfort regarding venous congestion.    In addition, behavioral modification including elevation during the day was again discussed and this will continue.  The patient has utilized over the counter pain medications and has been exercising.  However, at this time conservative therapy has not alleviated the patient's symptoms of leg pain and swelling  Recommend: laser ablation of the left great saphenous veins to eliminate the symptoms of pain and swelling of the lower extremities caused by the severe superficial venous reflux disease.

## 2023-01-18 NOTE — Progress Notes (Signed)
MRN : [id number]  Lisa Fuentes is a 62 y.o. (Jun 24, 1960) female who presents with chief complaint of  Chief Complaint  Patient presents with   Follow-up    2-3 month reflux follow up  .  History of Present Illness: Patient returns today in follow up of her leg pain and swelling.  Left leg is the more severely affected the 2 legs.  She had a PE about 3 months ago that was treated with thrombectomy and she has done well from this.  She is at her baseline and respiratory status.  She has been wearing her compression socks and elevating her legs now for 3 months.  She walks regularly but her ambulation has been more limited due to pain.  The swelling has persisted despite appropriate conservative therapies.  A venous reflux study was performed today showing no evidence of DVT or superficial thrombophlebitis.  The right great saphenous vein remains ablated.  There is reflux in the right anterior accessory saphenous vein.  The left great saphenous vein has long segment reflux throughout.  Current Outpatient Medications  Medication Sig Dispense Refill   Acetaminophen (TYLENOL ARTHRITIS PAIN PO) Take 1,300 mg by mouth every 6 (six) hours as needed (Pain).     apixaban (ELIQUIS) 5 MG TABS tablet Take 5 mg by mouth 2 (two) times daily.     cyclobenzaprine (FLEXERIL) 10 MG tablet Take 1 tablet (10 mg total) by mouth 3 (three) times daily as needed for muscle spasms.     ENTRESTO 24-26 MG Take 1 tablet by mouth 2 (two) times daily.     esomeprazole (NEXIUM) 40 MG capsule Take 40 mg by mouth every morning.      furosemide (LASIX) 20 MG tablet Take 20 mg by mouth every other day.     gabapentin (NEURONTIN) 100 MG capsule Take 100 mg by mouth 3 (three) times daily.     levothyroxine (SYNTHROID) 125 MCG tablet Take 125 mcg by mouth every morning.     Magnesium 250 MG TABS Take 1 tablet by mouth daily.     methocarbamol (ROBAXIN) 500 MG tablet Take 1 tablet (500 mg total) by mouth 4 (four) times daily.  20 tablet 0   metoprolol succinate (TOPROL-XL) 50 MG 24 hr tablet TAKE 1 TABLET BY MOUTH ONCE DAILY WITH  OR  IMMEDIATELY  FOLLOWING  A  MEAL 90 tablet 0   oxyCODONE-acetaminophen (PERCOCET/ROXICET) 5-325 MG tablet Take 1 tablet by mouth every 4 (four) hours as needed for severe pain.     predniSONE (DELTASONE) 50 MG tablet Take 1 tablet (50 mg total) by mouth daily with breakfast. 5 tablet 0   Semaglutide-Weight Management 0.25 MG/0.5ML SOAJ Inject 0.25 mg into the skin once a week. For 4 weeks     Semaglutide-Weight Management 1 MG/0.5ML SOAJ Inject 1 mg into the skin once a week for 28 days. 2 mL 0   [START ON 02/23/2023] Semaglutide-Weight Management 2.4 MG/0.75ML SOAJ Inject 2.4 mg into the skin once a week for 28 days. 3 mL 0   sertraline (ZOLOFT) 100 MG tablet Take 200 mg by mouth daily.     spironolactone (ALDACTONE) 25 MG tablet Take 1 tablet (25 mg total) by mouth daily. 90 tablet 3   [START ON 01/25/2023] Semaglutide-Weight Management 1.7 MG/0.75ML SOAJ Inject 1.7 mg into the skin once a week for 28 days. (Patient not taking: Reported on 01/18/2023) 3 mL 0   No current facility-administered medications for this visit.  Past Medical History:  Diagnosis Date   AICD (automatic cardioverter/defibrillator) present 2008   a.) Medtronic device placed 2008. b.) replaced 06/21/2013 (Medtronic Evera XT single-chamber ICD; model number [id number]).   Anemia    Anxiety    Aortic dilatation (HCC) 03/24/2019   a.) TTE 03/24/2019: Ao root measured 37 mm. b.) TTE 03/21/2020: Ao root measured 37 mm.   Arthritis    Depression    Difficult intubation    DOE (dyspnea on exertion)    Endometriosis of vagina 09/2013   Intra-operative findings of endometriosis implants on cervical stump   Essential hypertension    GERD (gastroesophageal reflux disease)    Headache    HFrEF (heart failure with reduced ejection fraction) (HCC)    a.) TTE 02/18/2014: EF 40-45%; tiv AR, mild MR/TR/PR; G1DD. b.) TTE  02/22/2017: EF 45-50%; mild LVH; triv PR; G1DD. c.) TTE 03/24/2019: EF 45-50%; glob HK, mild LVH; triv MR/TR/PR. d.) TTE 03/21/2020: EF 40-45%; glob HK.   History of 2019 novel coronavirus disease (COVID-19) 02/02/2020   History of 2019 novel coronavirus disease (COVID-19) 02/02/2020   Hypokalemia    Hypothyroidism    a.) s/p radioactive iodine Tx; on levothyroxine   Mixed hyperlipidemia    Nonischemic cardiomyopathy (HCC)    a.) TTE 02/18/2014: EF 40-45%. b.) TTE 02/22/2017: EF 45-50%. c.) TTE 03/24/2019: EF 45-50%. d.) TTE 03/21/2020: EF 40-45%   OSA on CPAP    Panic attacks    PSVT (paroxysmal supraventricular tachycardia) (HCC) 03/30/2020   a.) Holter 03/30/2020 --> 2 runs; fastest lasting 4 beats (184 bpm); longest lasting 4 beats (113 bpm)   Pulmonary embolism (HCC)    Tachycardia     Past Surgical History:  Procedure Laterality Date   ANTERIOR AND POSTERIOR REPAIR N/A 11/25/2017   Procedure: ANTERIOR (CYSTOCELE) AND POSTERIOR REPAIR (RECTOCELE);  Surgeon: Hildred Laser, MD;  Location: [facility];  Service: Gynecology;  Laterality: N/A;   BREAST CYST ASPIRATION Left 03/22/2014   neg/ done by Dr Evette Cristal   CARDIAC CATHETERIZATION  2003   [facility]: No significant coronary artery disease with reduced ejection fraction.   CARDIAC DEFIBRILLATOR PLACEMENT  04/2006   replaced 05/2013   CARDIAC DEFIBRILLATOR PLACEMENT  06/21/2013   Procedure:  CARDIAC DEFIBRILLATOR PLACEMENT (Medtronic single-chamber ICD, model number Lianne Moris, model number [id number]): [facility]: [facility]; Surgeon: Gerre Pebbles, MD   CARPAL TUNNEL RELEASE Right    COLONOSCOPY  2015   COLONOSCOPY N/A 10/13/2020   Procedure: COLONOSCOPY;  Surgeon: Jaynie Collins, DO;  Location: [facility] ENDOSCOPY;  Service: Gastroenterology;  Laterality: N/A;   COLONOSCOPY WITH PROPOFOL N/A 10/04/2015   Procedure: COLONOSCOPY WITH PROPOFOL;  Surgeon: Midge Minium, MD;  Location: [facility] ENDOSCOPY;  Service: Endoscopy;  Laterality: N/A;    CORONARY ANGIOPLASTY     CYSTOSCOPY  11/15/2014   Procedure: CYSTOSCOPY;  Surgeon: Hildred Laser, MD;  Location: [facility];  Service: Gynecology;;   DIAGNOSTIC LAPAROSCOPY     ETHMOIDECTOMY Right 07/19/2021   Procedure: TOTAL ETHMOIDECTOMY WITH FRONTAL SINUS EXPLORATION;  Surgeon: Vernie Murders, MD;  Location: [facility];  Service: ENT;  Laterality: Right;   FOOT SURGERY Bilateral    heer spur and bunion   IMAGE GUIDED SINUS SURGERY N/A 07/19/2021   Procedure: IMAGE GUIDED SINUS SURGERY;  Surgeon: Vernie Murders, MD;  Location: [facility];  Service: ENT;  Laterality: N/A;   KNEE ARTHROSCOPY Left 03/02/2015   Procedure: ARTHROSCOPY  LEFT KNEE, PARTIAL LATERAL  MENISECTOMY, SYNOVECTOMY, MEDIAL & LATERAL CHONDROPLASTY;  Surgeon: Juanell Fairly, MD;  Location: [facility];  Service: Orthopedics;  Laterality: Left;   LAPAROSCOPIC SALPINGO OOPHERECTOMY Left 11/15/2014   Procedure: LAPAROSCOPIC OOPHORECTOMY;  Surgeon: Hildred Laser, MD;  Location: [facility];  Service: Gynecology;  Laterality: Left;   LAPAROSCOPIC SALPINGOOPHERECTOMY Right 09/2013   also with left salpingectomy   LEFT HEART CATH AND CORONARY ANGIOGRAPHY N/A 03/28/2020   Procedure: LEFT HEART CATH AND CORONARY ANGIOGRAPHY;  Surgeon: Iran Ouch, MD;  Location: [facility];  Service: Cardiovascular;  Laterality: N/A;   MAXILLARY ANTROSTOMY Right 07/19/2021   Procedure: MAXILLARY ANTROSTOMY WITH REMOVAL TISSUE;  Surgeon: Vernie Murders, MD;  Location: [facility];  Service: ENT;  Laterality: Right;   MINOR HEMORRHOIDECTOMY     NASAL TURBINATE REDUCTION Bilateral 07/19/2021   Procedure: INFERIOR TURBINATE REDUCTION;  Surgeon: Vernie Murders, MD;  Location: [facility];  Service: ENT;  Laterality: Bilateral;   PULMONARY THROMBECTOMY Bilateral 10/03/2022   Procedure: PULMONARY THROMBECTOMY;  Surgeon: Annice Needy, MD;  Location: [facility];  Service: Cardiovascular;  Laterality: Bilateral;   SEPTOPLASTY N/A 07/19/2021   Procedure:  SEPTOPLASTY;  Surgeon: Vernie Murders, MD;  Location: [facility];  Service: ENT;  Laterality: N/A;   TONSILLECTOMY     TOTAL ABDOMINAL HYSTERECTOMY     TRACHELECTOMY N/A 11/15/2014   Procedure: TRACHELECTOMY;  Surgeon: Hildred Laser, MD;  Location: [facility];  Service: Gynecology;  Laterality: N/A;   TRIGGER FINGER RELEASE Right    TUBAL LIGATION Bilateral      Social History   Tobacco Use   Smoking status: Never   Smokeless tobacco: Never  Vaping Use   Vaping status: Never Used  Substance Use Topics   Alcohol use: Yes    Alcohol/week: 0.0 standard drinks of alcohol    Comment: occassionally   Drug use: No      Family History  Problem Relation Age of Onset   Hypertension Mother    Hyperlipidemia Mother    Stroke Mother    Colon cancer Mother    Breast cancer Sister 84   Ovarian cancer Sister    Throat cancer Brother      Allergies  Allergen Reactions   Sulfa Antibiotics Hives   Sulfamethoxazole-Trimethoprim Hives   Jardiance [Empagliflozin] Rash    Yeast rash     REVIEW OF SYSTEMS (Negative unless checked)   Constitutional: [] Weight loss  [] Fever  [] Chills Cardiac: [] Chest pain   [] Chest pressure   [] Palpitations   [] Shortness of breath when laying flat   [] Shortness of breath at rest   [] Shortness of breath with exertion. Vascular:  [] Pain in legs with walking   [] Pain in legs at rest   [] Pain in legs when laying flat   [] Claudication   [] Pain in feet when walking  [] Pain in feet at rest  [] Pain in feet when laying flat   [x] History of DVT   [x] Phlebitis   [x] Swelling in legs   [] Varicose veins   [] Non-healing ulcers Pulmonary:   [] Uses home oxygen   [] Productive cough   [] Hemoptysis   [] Wheeze  [] COPD   [] Asthma Neurologic:  [] Dizziness  [] Blackouts   [] Seizures   [] History of stroke   [] History of TIA  [] Aphasia   [] Temporary blindness   [] Dysphagia   [] Weakness or numbness in arms   [] Weakness or numbness in legs Musculoskeletal:  [] Arthritis   [] Joint swelling    [] Joint pain   [] Low back pain Hematologic:  [] Easy bruising  [] Easy bleeding   [] Hypercoagulable state   [x] Anemic  [] Hepatitis Gastrointestinal:  []   Blood in stool   [] Vomiting blood  [x] Gastroesophageal reflux/heartburn   [] Abdominal pain Genitourinary:  [] Chronic kidney disease   [] Difficult urination  [] Frequent urination  [] Burning with urination   [] Hematuria Skin:  [] Rashes   [] Ulcers   [] Wounds Psychological:  [x] History of anxiety   [x]  History of major depression.  Physical Examination  BP 103/69   Pulse 79   Resp 16   Wt 247 lb 3.2 oz (112.1 kg)   BMI 38.72 kg/m  Gen:  WD/WN, NAD Head: [place]/AT, No temporalis wasting. Ear/Nose/Throat: Hearing grossly intact, nares w/o erythema or drainage Eyes: Conjunctiva clear. Sclera non-icteric Neck: Supple.  Trachea midline Pulmonary:  Good air movement, no use of accessory muscles.  Cardiac: RRR, no JVD Vascular:  Vessel Right Left  Radial Palpable Palpable                          PT Palpable Palpable  DP Palpable Palpable   Gastrointestinal: soft, non-tender/non-distended. No guarding/reflex.  Musculoskeletal: M/S 5/5 throughout.  No deformity or atrophy. 1+ RLE edema, 1-2+ LLE edema. Neurologic: Sensation grossly intact in extremities.  Symmetrical.  Speech is fluent.  Psychiatric: Judgment intact, Mood & affect appropriate for pt's clinical situation. Dermatologic: No rashes or ulcers noted.  No cellulitis or open wounds.      Labs Recent Results (from the past 2160 hours)  Antithrombin III     Status: None   Collection Time: 11/02/22  3:22 PM  Result Value Ref Range   AntiThromb III Func 117 75 - 120 %    Comment: Performed at [facility], [address], [place], [place] [address]  PROTEIN S PANEL     Status: Abnormal   Collection Time: 11/02/22  3:22 PM  Result Value Ref Range   Protein S Ag, Total 87 60 - 150 %    Comment: (NOTE) This test was developed and its performance  characteristics determined by [facility]. It has not been cleared or approved by the Food and Drug Administration.    Protein S Ag, Free 94 61 - 136 %   Protein S Activity 60 (L) 63 - 140 %    Comment: (NOTE) A deficiency of protein S (PS), either congenital or acquired, increases the risk of thromboembolism. PS activity levels may be falsely low in individuals with APCR/Factor V Leiden. Consider performing free protein S antigen in those with APCR/Factor V Leiden before making a diagnosis of protein S deficiency. Acquired PS deficiency is more common than congenital deficiency. PS values decrease with normal pregnancy, and are also dependent on age, sex and hormone status. PS values tend to be lower in a younger age group and lower in women than in men. Levels may be decreased in pre-menopausal women on oral contraceptive agents. Acquired deficiency can occur as a result of vitamin K deficiency or antagonism, severe hepatic disorders, (hepatitis, cirrhosis, etc.), nephrotic syndrome, inflammatory bowel disease, certain chemotherapeutic agents, L-asparaginse therapy, sepsis, disseminated intravascular coagulation (DIC) and acute thrombosis. Levels may be decreased in  patients with polycythemia vera, sickle cell disease and essential thrombocythemia. Repeat evaluation on a new plasma sample to confirm or refute this result should be considered, after ruling out acquired causes, depending on the clinical scenario. Performed At: [facility] [address] [place], [place] [id number] Jolene Schimke MD WN:[id number]   Factor 5 leiden     Status: None   Collection Time: 11/02/22  3:22 PM  Result Value Ref Range  Recommendations-F5LEID: Comment     Comment: (NOTE) Result: c.1601G>A (p.Arg534Gln) - Not Detected This result is not associated with an increased risk for venous thromboembolism. See Additional Clinical Information and Comments. Additional Clinical  Information: Venous thromboembolism is a multifactorial disease influenced by genetic, environmental, and circumstantial risk factors. The c.1601G>A (p. Arg534Gln) variant in the F5 gene, commonly referred to as Factor V Leiden, is a genetic risk factor for venous thromboembolism. Heterozygous carriers of this variant have a 6- to 8- fold increased risk for venous thromboembolism. Individuals homozygous for this variant (ie, with a copy of the variant on each chromosome) have an approximately 80-fold increased risk for venous thromboembolism. Individuals who carry both a c.*97G>A variant in the F2 gene and Factor V Leiden have an approximately 20-fold increased risk for venous thromboembolism. Risks are likely to be even higher in more complex genotype combinations in volving the F2 c.*97G>A variant and Factor V Leiden (PMID: 91478295). Additional risk factors include but are not limited to: deficiency of protein C, protein S, or antithrombin III, age, female sex, personal or family history of deep vein thromboembolism, smoking, surgery, prolonged immobilization, malignant neoplasm, tamoxifen treatment, raloxifene treatment, oral contraceptive use, hormone replacement therapy, and pregnancy. Management of thrombotic risk and thrombotic events should follow established guidelines and fit the clinical circumstance. This result cannot predict the occurrence or recurrence of a thrombotic event. Comment: Genetic counseling is recommended to discuss the potential clinical implications of positive results, as well as recommendations for testing family members. Genetic Coordinators are available for health care providers to discuss results at [phone]). Test Details: Variant Analyzed: c.1601G>A (p. Arg534Gln), referred to as Fact or V Leiden Methods/Limitations: DNA analysis of the F5 gene (NM_000130.5) was performed by PCR amplification followed by restriction enzyme analysis.  The diagnostic sensitivity is >99%. Results must be combined with clinical information for the most accurate interpretation. Molecular- based testing is highly accurate, but as in any laboratory test, diagnostic errors may occur. False positive or false negative results may occur for reasons that include genetic variants, blood transfusions, bone marrow transplantation, somatic or tissue-specific mosaicism, mislabeled samples, or erroneous representation of family relationships. This test was developed and its performance characteristics determined by [facility]. It has not been cleared or approved by the Food and Drug Administration. References: Dewitt Hoes [facility], Valentina Lucks [facility]; ACMG Professional Practice and Guidelines Committee. Addendum: [employer] of Medical Genetics consensus statement on fac tor V Leiden mutation testing. Genet Med. 2021 Mar 5. doi: 08.6578/I69629-528- 01108-x. PMID: 41324401. Cherrie Gauze. Factor V Leiden Thrombophilia. 1999 May 14 (Updated 2018 Jan 4). In: Bufford Buttner, Ardinger HH, Pagon RA, et al., editors. GeneReviews(R) (Internet). [address] ([place]): [place], [place]; 0272-5366. Available from: [email or web address] Nita Sickle, Blanca Friend, Alvie Heidelberg CS; ACMG Laboratory Quality Assurance Committee. Venous thromboembolism laboratory testing (factor V Leiden and factor II c.*97G>A), 2018 update: a technical standard of the [employer] of [employer] and [employer] (ACMG). Genet Med. 2018 Dec;20(12):1489-1498. doi: 10.1038/s41436-2135663190-z. Epub 2018 Oct 5. PMID: 44034742.    Reviewed By: Comment     Comment: (NOTE) Technical Component performed at [employer] [facility] Professional Component performed by: [employer] Elgie Collard, Ph.D., [facility] Director, Molecular Genetics [address] [place] [address] Performed At: [facility] [address] [place], [place] [id number] Maurine Simmering MDPhD [id number]   Protein C, total     Status: None  Collection Time: 11/02/22  3:22 PM  Result Value Ref Range   Protein C, Total 74 60 - 150 %    Comment: (NOTE) Performed At: [facility] [address] [place], [place] [id number] Jolene Schimke MD [id number]   Lupus anticoagulant     Status: Abnormal   Collection Time: 11/02/22  3:22 PM  Result Value Ref Range   dPT 46.5 0.0 - 47.6 sec   dPT Confirm Ratio 0.97 0.00 - 1.34 Ratio   Thrombin Time 20.4 0.0 - 23.0 sec   PTT Lupus Anticoagulant 36.0 0.0 - 43.5 sec   DRVVT 57.3 (H) 0.0 - 47.0 sec   Lupus Anticoag Interp Comment:     Comment: (NOTE) No lupus anticoagulant was detected. These results are consistent with specific inhibitors to one or more common pathway factors (X, V, II or fibrinogen). Performed At: [facility] [address] [place], [place] [id number] Jolene Schimke MD QI:[id number]   Prothrombin gene mutation     Status: None   Collection Time: 11/02/22  3:22 PM  Result Value Ref Range   Recommendations-PTGENE: Comment     Comment: (NOTE) Result: c.*97G>A - Not Detected This result is not associated with an increased risk for venous thromboembolism. See Additional Clinical Information and Comments. Additional Clinical Information: Venous thromboembolism is a multifactorial disease influenced by genetic, environmental, and circumstantial risk factors. The c.*97G>A variant in the F2 gene is a genetic risk factor for venous thromboembolism. Heterozygous carriers have a 2- to 4-fold increased risk for venous thromboembolism. Homozygotes for the c.*97G>A variant are rare. The annual risk of VTE in homozygotes has been reported to be 1.1%/year. Individuals who carry both a c.*97G>A variant in the F2 gene and a c.1601G>A (p. Arg534Gln) variant in the F5 gene (commonly referred to as Factor V Leiden) have an approximately  20- fold increased risk for venous thromboembolism. Risks are likely to be even higher in more complex genotype combinations involving the F2 c.*97G>A variant and Factor V Leiden (PMID:  [id number]). Additional risk factors include but are not limited to: deficiency of protein C, protein S, or antithrombin III, age, female sex, personal or family history of deep vein thromboembolism, smoking, surgery, prolonged immobilization, malignant neoplasm, tamoxifen treatment, raloxifene treatment, oral contraceptive use, hormone replacement therapy, and pregnancy. Management of thrombotic risk and thrombotic events should follow established guidelines and fit the clinical circumstance. This result cannot predict the occurrence or recurrence of a thrombotic event. Comments: Genetic counseling is recommended to discuss the potential clinical implications of positive results, as well as recommendations for testing family members. Genetic Coordinators are available for health care providers to discuss results at [phone]). Test Details: Variant analyzed: c.*97G>A, previously referred to as G20210A Methods/Limitations: DNA analysis of the F2 gene (NM_000 506.5) was performed by PCR amplification followed by restriction enzyme analysis. The diagnostic sensitivity is >99%. Results must be combined with clinical information for the most accurate interpretation. Molecular-based testing is highly accurate, but as in any laboratory test, diagnostic errors may occur. False positive or false negative results may occur for reasons that include genetic variants, blood transfusions, bone marrow transplantation, somatic or tissue-specific mosaicism, mislabeled samples, or erroneous representation of family relationships. This test was developed and its performance characteristics determined by [facility]. It has not been cleared or approved by the Food and Drug Administration. References: Dewitt Hoes [facility], [facility]; ACMG Professional Practice and Guidelines Committee. Addendum: [employer] of Medical Genetics consensus statement on factor  V Leiden mutation testing. Genet Med. 2021 Mar 5. doi: 78.2956/O130 [phone]-x. PMID: 86578469. Cherrie Gauze. Prothrombin Thrombophilia. 2006 Jul 25 [Updated 2021 Feb 4]. In: Bufford Buttner, Ardinger HH, Pagon RA, et al., editors. GeneReviews(R) [Internet]. [address] ([place]): [place], [place]; 6295-2841. Available from: [email or web address].com/ Nita Sickle, Blanca Friend, Alvie Heidelberg CS; ACMG Laboratory Quality Assurance Committee. Venous thromboembolism laboratory testing (factor V Leiden and factor II c.*97G>A), 2018 update: a technical standard of the [employer] of [employer] and [employer] (ACMG). Genet Med. 2018 Dec;20(12):1489-1498. doi: 10.1038/s41436-309 656 7133-z. Epub 2018 Oct 5. PMID: 32440102.    Reviewed by: Comment     Comment: (NOTE) Alpha Gula, PhD Director, Molecular Genetics Performed At: [facility] [address] [place], [place] [id number] Maurine Simmering MDPhD [id number]   Beta-2-glycoprotein i abs, IgG/M/A     Status: None   Collection Time: 11/02/22  3:22 PM  Result Value Ref Range   Beta-2 Glyco I IgG <9 0 - 20 GPI IgG units    Comment: (NOTE) The reference interval reflects a 3SD or 99th percentile interval, which is thought to represent a potentially clinically significant result in accordance with the International Consensus Statement on the classification criteria for definitive antiphospholipid syndrome (APS). J Thromb Haem 2006;4:295-306.    Beta-2-Glycoprotein I IgM 12 0 - 32 GPI IgM units    Comment: (NOTE) The reference interval reflects a 3SD or 99th percentile interval, which is thought to represent a potentially clinically significant result in accordance with the International Consensus Statement on the  classification criteria for definitive antiphospholipid syndrome (APS). J Thromb Haem 2006;4:295-306. Performed At: [facility] [address] [place], [place] [id number] Jolene Schimke MD [id number]    Beta-2-Glycoprotein I IgA <9 0 - 25 GPI IgA units    Comment: (NOTE) The reference interval reflects a 3SD or 99th percentile interval, which is thought to represent a potentially clinically significant result in accordance with the International Consensus Statement on the classification criteria for definitive antiphospholipid syndrome (APS). J Thromb Haem 2006;4:295-306.   Cardiolipin antibodies, IgG, IgM, IgA     Status: None   Collection Time: 11/02/22  3:22 PM  Result Value Ref Range   Anticardiolipin IgG <9 0 - 14 GPL U/mL    Comment: (NOTE)                          Negative:              <15                          Indeterminate:     15 - 20                          Low-Med Positive: >20 - 80                          High Positive:         >80    Anticardiolipin IgM <9 0 - 12 MPL U/mL    Comment: (NOTE)                          Negative:              <13  Indeterminate:     13 - 20                          Low-Med Positive: >20 - 80                          High Positive:         >80    Anticardiolipin IgA <9 0 - 11 APL U/mL    Comment: (NOTE)                          Negative:              <12                          Indeterminate:     12 - 20                          Low-Med Positive: >20 - 80                          High Positive:         >80 Performed At: [facility] [address] [place], [place] [id number] Jolene Schimke MD NW:[id number]   Hexagonal Phase Phospholipid     Status: None   Collection Time: 11/02/22  3:22 PM  Result Value Ref Range   Hex Phosph Neut Test 4 0 - 11 sec    Comment: (NOTE) Performed At: [facility] [address] [place], [place] [id number] Jolene Schimke MD [id number]    Hex phase phospholipid reflex     Status: None   Collection Time: 11/02/22  3:22 PM  Result Value Ref Range   Hex phase phospholipid comment Comment     Comment: (NOTE) Results do not indicate the presence of a Lupus Anticoagulant: abnormal high screening results (PTT-LA, dRVVT, mixing studies), may be due to medication (heparin, warfarin, aspirin), Factor inhibitors, anticardiolipin antibodies, or poor specimen integrity. Performed At: [facility] [address] [place], [place] [id number] Jolene Schimke MD IH:[id number]   dRVVT Mix     Status: Abnormal   Collection Time: 11/02/22  3:22 PM  Result Value Ref Range   dRVVT Mix 46.6 (H) 0.0 - 40.4 sec    Comment: (NOTE) Performed At: [facility] [address] [place], [place] [id number] Jolene Schimke MD [id number]   dRVVT Confirm     Status: None   Collection Time: 11/02/22  3:22 PM  Result Value Ref Range   dRVVT Confirm 0.9 0.8 - 1.2 ratio    Comment: (NOTE) Performed At: [facility] [address] [place], [place] [id number] Jolene Schimke MD TD:[id number]   Basic metabolic panel     Status: None   Collection Time: 12/07/22  2:16 PM  Result Value Ref Range   Glucose 83 70 - 99 mg/dL   BUN 14 8 - 27 mg/dL   Creatinine, Ser 6.23 0.57 - 1.00 mg/dL   eGFR 69 >76 EG/BTD/1.76   BUN/Creatinine Ratio 15 12 - 28   Sodium 138 134 - 144 mmol/L   Potassium 4.4 3.5 - 5.2 mmol/L   Chloride 101 96 - 106 mmol/L   CO2 22 20 - 29 mmol/L   Calcium 10.1 8.7 - 10.3 mg/dL  ECHOCARDIOGRAM COMPLETE  Status: None   Collection Time: 12/13/22 11:34 AM  Result Value Ref Range   Ao pk vel 0.87 m/s   AV Area VTI 2.58 cm2   AR max vel 2.35 cm2   AV Mean grad 2.0 mmHg   AV Peak grad 3.0 mmHg   Single Plane A2C EF 36.7 %   Single Plane A4C EF 27.0 %   Calc EF 29.9 %   S' Lateral 4.80 cm   AV Area mean vel 2.09 cm2   Area-P 1/2 5.54 cm2   MV VTI 2.30 cm2   Est EF 25 - 30%   CBC with Differential      Status: None   Collection Time: 12/14/22 11:31 AM  Result Value Ref Range   WBC 4.6 4.0 - 10.5 K/uL   RBC 4.69 3.87 - 5.11 MIL/uL   Hemoglobin 14.2 12.0 - 15.0 g/dL   HCT 16.1 09.6 - 04.5 %   MCV 92.8 80.0 - 100.0 fL   MCH 30.3 26.0 - 34.0 pg   MCHC 32.6 30.0 - 36.0 g/dL   RDW 40.9 81.1 - 91.4 %   Platelets 156 150 - 400 K/uL   nRBC 0.0 0.0 - 0.2 %   Neutrophils Relative % 52 %   Neutro Abs 2.4 1.7 - 7.7 K/uL   Lymphocytes Relative 39 %   Lymphs Abs 1.8 0.7 - 4.0 K/uL   Monocytes Relative 7 %   Monocytes Absolute 0.3 0.1 - 1.0 K/uL   Eosinophils Relative 1 %   Eosinophils Absolute 0.0 0.0 - 0.5 K/uL   Basophils Relative 1 %   Basophils Absolute 0.0 0.0 - 0.1 K/uL   Immature Granulocytes 0 %   Abs Immature Granulocytes 0.01 0.00 - 0.07 K/uL    Comment: Performed at [facility], [address] [place], [place] [address]  Basic metabolic panel     Status: None   Collection Time: 12/14/22 11:31 AM  Result Value Ref Range   Sodium 138 135 - 145 mmol/L   Potassium 3.8 3.5 - 5.1 mmol/L   Chloride 103 98 - 111 mmol/L   CO2 26 22 - 32 mmol/L   Glucose, Bld 84 70 - 99 mg/dL    Comment: Glucose reference range applies only to samples taken after fasting for at least 8 hours.   BUN 14 6 - 20 mg/dL   Creatinine, Ser 6.21 0.44 - 1.00 mg/dL   Calcium 9.6 8.9 - 30.8 mg/dL   GFR, Estimated >65 >78 mL/min    Comment: (NOTE) Calculated using the CKD-EPI Creatinine Equation (2021)    Anion gap 9 5 - 15    Comment: Performed at [facility], [address] [place], [place] [address]  Hepatic function panel     Status: None   Collection Time: 12/14/22 11:31 AM  Result Value Ref Range   Total Protein 7.8 6.5 - 8.1 g/dL   Albumin 4.3 3.5 - 5.0 g/dL   AST 25 15 - 41 U/L   ALT 26 0 - 44 U/L   Alkaline Phosphatase 63 38 - 126 U/L   Total Bilirubin 0.8 <1.2 mg/dL   Bilirubin, Direct 0.2 0.0 - 0.2 mg/dL   Indirect Bilirubin 0.6 0.3 - 0.9 mg/dL    Comment: Performed at  [facility], [address] [place], [place] [address]  Lipase, blood     Status: None   Collection Time: 12/14/22 11:31 AM  Result Value Ref Range   Lipase 28 11 -  51 U/L    Comment: Performed at [facility], [address] [place], [place] [address]  NM Myocar Multi W/Spect Izetta Dakin Motion / EF     Status: None   Collection Time: 01/09/23 11:15 AM  Result Value Ref Range   Rest HR 72.0 bpm   Rest BP 114/72 mmHg   Peak HR 98 bpm   Peak BP 118/40 mmHg   MPHR 160 bpm   Percent HR 61.0 %   Rest Nuclear Isotope Dose 10.4 mCi   Stress Nuclear Isotope Dose 31.6 mCi   SSS 13.0    SRS 5.0    SDS 3.0    TID 0.93    LV sys vol 71.0 mL   LV dias vol 149.0 46 - 106 mL   Nuc Stress EF 52 %    Radiology NM Myocar Multi W/Spect W/Wall Motion / EF Result Date: 01/11/2023 Pharmacological myocardial perfusion imaging study with very small region of mild ischemia in the distal anterior wall (unable to exclude attenuation artifact) apical wall hypokinesis, EF estimated at 40% No EKG changes concerning for ischemia at peak stress or in recovery. CT attenuation correction images detailing pacing wire, no significant aortic atherosclerosis or coronary calcification Low to moderate risk scan Signed, Dossie Arbour, MD, Ph.D [facility]    Assessment/Plan  Varicose veins of leg with swelling, bilateral A venous reflux study was performed today showing no evidence of DVT or superficial thrombophlebitis.  The right great saphenous vein remains ablated.  There is reflux in the right anterior accessory saphenous vein.  The left great saphenous vein has long segment reflux throughout. Recommend  I have reviewed my previous  discussion with the patient regarding  varicose veins and why they cause symptoms. Patient will continue  wearing graduated compression stockings class 1 on a daily basis, beginning first thing in the morning and removing them in the evening.  The patient is CEAP  [id number].  The patient has been wearing compression for more than 12 weeks with no or little benefit.  The patient has been exercising daily for more than 12 weeks. The patient has been elevating and taking OTC pain medications for more than 12 weeks.  None of these have have eliminated the pain related to the varicose veins and venous reflux or the discomfort regarding venous congestion.    In addition, behavioral modification including elevation during the day was again discussed and this will continue.  The patient has utilized over the counter pain medications and has been exercising.  However, at this time conservative therapy has not alleviated the patient's symptoms of leg pain and swelling  Recommend: laser ablation of the left great saphenous veins to eliminate the symptoms of pain and swelling of the lower extremities caused by the severe superficial venous reflux disease.   Pulmonary emboli [facility]) The patient is doing well from a pulmonary standpoint after thrombectomy about 3 months ago.  She will continue full dose anticoagulation for at least 1 year and then at that point we can discuss reduction of the dose of anticoagulation or even cessation of anticoagulation depending on her preferences.   Essential hypertension blood pressure control important in reducing the progression of atherosclerotic disease. On appropriate oral medications.  Festus Barren, MD  01/18/2023 12:19 PM    This note was created with Dragon medical transcription system.  Any errors from dictation are purely unintentional

## 2023-01-21 ENCOUNTER — Telehealth: Payer: Self-pay | Admitting: Cardiology

## 2023-01-21 NOTE — Telephone Encounter (Signed)
No refills needed. Pt was sent a year supply of Spironolactone to [facility] in October.

## 2023-01-28 DIAGNOSIS — M13811 Other specified arthritis, right shoulder: Secondary | ICD-10-CM | POA: Diagnosis not present

## 2023-02-06 DIAGNOSIS — M47817 Spondylosis without myelopathy or radiculopathy, lumbosacral region: Secondary | ICD-10-CM | POA: Diagnosis not present

## 2023-02-06 DIAGNOSIS — Z79899 Other long term (current) drug therapy: Secondary | ICD-10-CM | POA: Diagnosis not present

## 2023-02-06 DIAGNOSIS — G894 Chronic pain syndrome: Secondary | ICD-10-CM | POA: Diagnosis not present

## 2023-02-06 DIAGNOSIS — M533 Sacrococcygeal disorders, not elsewhere classified: Secondary | ICD-10-CM | POA: Diagnosis not present

## 2023-02-06 DIAGNOSIS — Z5181 Encounter for therapeutic drug level monitoring: Secondary | ICD-10-CM | POA: Diagnosis not present

## 2023-02-06 DIAGNOSIS — R202 Paresthesia of skin: Secondary | ICD-10-CM | POA: Diagnosis not present

## 2023-02-07 DIAGNOSIS — M25511 Pain in right shoulder: Secondary | ICD-10-CM | POA: Diagnosis not present

## 2023-02-11 ENCOUNTER — Ambulatory Visit: Payer: Medicare HMO | Attending: Cardiology | Admitting: Cardiology

## 2023-02-11 ENCOUNTER — Encounter: Payer: Self-pay | Admitting: Cardiology

## 2023-02-11 VITALS — BP 116/88 | HR 81 | Ht 67.0 in | Wt 249.0 lb

## 2023-02-11 DIAGNOSIS — Z95810 Presence of automatic (implantable) cardiac defibrillator: Secondary | ICD-10-CM

## 2023-02-11 DIAGNOSIS — I428 Other cardiomyopathies: Secondary | ICD-10-CM

## 2023-02-11 DIAGNOSIS — I2699 Other pulmonary embolism without acute cor pulmonale: Secondary | ICD-10-CM | POA: Diagnosis not present

## 2023-02-11 MED ORDER — FUROSEMIDE 20 MG PO TABS: ORAL_TABLET | ORAL | 0 refills | Status: DC

## 2023-02-11 NOTE — Patient Instructions (Signed)
 Medication Instructions:   Lasix  - Take one tablet ( 20mg ) by mouth Monday, Wednesday, Friday.   *If you need a refill on your cardiac medications before your next appointment, please call your pharmacy*   Lab Work:  None Ordered  If you have labs (blood work) drawn today and your tests are completely normal, you will receive your results only by: MyChart Message (if you have MyChart) OR A paper copy in the mail If you have any lab test that is abnormal or we need to change your treatment, we will call you to review the results.   Testing/Procedures:  None Ordered   Follow-Up: At [facility], you and your health needs are our priority.  As part of our continuing mission to provide you with exceptional heart care, we have created designated Provider Care Teams.  These Care Teams include your primary Cardiologist (physician) and Advanced Practice Providers (APPs -  Physician Assistants and Nurse Practitioners) who all work together to provide you with the care you need, when you need it.  We recommend signing up for the patient portal called MyChart.  Sign up information is provided on this After Visit Summary.  MyChart is used to connect with patients for Virtual Visits (Telemedicine).  Patients are able to view lab/test results, encounter notes, upcoming appointments, etc.  Non-urgent messages can be sent to your provider as well.   To learn more about what you can do with MyChart, go to [email or web address].    Your next appointment:   6 month(s)  Provider:   You may see Redell Cave, MD or one of the following Advanced Practice Providers on your designated Care Team:   Lonni Meager, NP Bernardino Bring, PA-C Cadence Franchester, PA-C Tylene Lunch, NP Barnie Hila, NP

## 2023-02-11 NOTE — Progress Notes (Signed)
 Cardiology Office Note:    Date:  02/12/2023   ID:  Lisa Fuentes, DOB 1960-02-02, MRN [id number]  PCP:  Valora Agent, MD   [facility] Providers Cardiologist:  Redell Cave, MD Electrophysiologist:  Elspeth Sage, MD     Referring MD: Valora Agent, MD   Chief Complaint  Patient presents with   Follow-up    Patient denies new or acute cardiac problems/concerns today.      History of Present Illness:    MELENDA Fuentes is a 63 y.o. female with a hx of NICM s/p ICD 2008, GEN change 2015, hypertension, hyperlipidemia, PE s/p endovascular thrombolysis 9/24 on Eliquis , OSA presents for follow-up.  Being seen for nonischemic cardiomyopathy.  Takes GDMT as prescribed.  Misses occasional doses of Lasix .  Lasix  currently ordered as every other day.  Recently establish care with heart failure service, Aldactone  increased to 25 mg daily.  Denies edema, denies chest pain or shortness of breath.  Has right shoulder pain secondary to trauma/fall.  Thinks she might have torn her rotator cuff.  CT of the shoulder scheduled.  Not able to obtain MR due to ICD placed in 2008.   Prior notes Echo 9/24 EF 30 to 35% Lexiscan  Myoview  9/24 no ischemia. Left heart cath 03/2020 no CAD Daughter also has heart failure. Not on Jardiance  due to yeast infections.  Past Medical History:  Diagnosis Date   AICD (automatic cardioverter/defibrillator) present 2008   a.) Medtronic device placed 2008. b.) replaced 06/21/2013 (Medtronic Evera XT single-chamber ICD; model number [id number]).   Anemia    Anxiety    Aortic dilatation (HCC) 03/24/2019   a.) TTE 03/24/2019: Ao root measured 37 mm. b.) TTE 03/21/2020: Ao root measured 37 mm.   Arthritis    Depression    Difficult intubation    DOE (dyspnea on exertion)    Endometriosis of vagina 09/2013   Intra-operative findings of endometriosis implants on cervical stump   Essential hypertension    GERD (gastroesophageal reflux disease)     Headache    HFrEF (heart failure with reduced ejection fraction) (HCC)    a.) TTE 02/18/2014: EF 40-45%; tiv AR, mild MR/TR/PR; G1DD. b.) TTE 02/22/2017: EF 45-50%; mild LVH; triv PR; G1DD. c.) TTE 03/24/2019: EF 45-50%; glob HK, mild LVH; triv MR/TR/PR. d.) TTE 03/21/2020: EF 40-45%; glob HK.   History of 2019 novel coronavirus disease (COVID-19) 02/02/2020   History of 2019 novel coronavirus disease (COVID-19) 02/02/2020   Hypokalemia    Hypothyroidism    a.) s/p radioactive iodine  Tx; on levothyroxine    Mixed hyperlipidemia    Nonischemic cardiomyopathy (HCC)    a.) TTE 02/18/2014: EF 40-45%. b.) TTE 02/22/2017: EF 45-50%. c.) TTE 03/24/2019: EF 45-50%. d.) TTE 03/21/2020: EF 40-45%   OSA on CPAP    Panic attacks    PSVT (paroxysmal supraventricular tachycardia) (HCC) 03/30/2020   a.) Holter 03/30/2020 --> 2 runs; fastest lasting 4 beats (184 bpm); longest lasting 4 beats (113 bpm)   Pulmonary embolism (HCC)    Tachycardia     Past Surgical History:  Procedure Laterality Date   ANTERIOR AND POSTERIOR REPAIR N/A 11/25/2017   Procedure: ANTERIOR (CYSTOCELE) AND POSTERIOR REPAIR (RECTOCELE);  Surgeon: Connell Davies, MD;  Location: [facility];  Service: Gynecology;  Laterality: N/A;   BREAST CYST ASPIRATION Left 03/22/2014   neg/ done by Dr Dellie   CARDIAC CATHETERIZATION  2003   [facility]: No significant coronary artery disease with reduced ejection fraction.   CARDIAC DEFIBRILLATOR PLACEMENT  04/2006   replaced 05/2013   CARDIAC DEFIBRILLATOR PLACEMENT  06/21/2013   Procedure:  CARDIAC DEFIBRILLATOR PLACEMENT (Medtronic single-chamber ICD, model number Sylvan HEWS, model number [id number]): Location: [facility]; Surgeon: Franky Ned, MD   CARPAL TUNNEL RELEASE Right    COLONOSCOPY  2015   COLONOSCOPY N/A 10/13/2020   Procedure: COLONOSCOPY;  Surgeon: Onita Elspeth Sharper, DO;  Location: [facility];  Service: Gastroenterology;  Laterality: N/A;   COLONOSCOPY WITH PROPOFOL  N/A  10/04/2015   Procedure: COLONOSCOPY WITH PROPOFOL ;  Surgeon: Rogelia Copping, MD;  Location: [facility];  Service: Endoscopy;  Laterality: N/A;   CORONARY ANGIOPLASTY     CYSTOSCOPY  11/15/2014   Procedure: CYSTOSCOPY;  Surgeon: Archie Savers, MD;  Location: [facility];  Service: Gynecology;;   DIAGNOSTIC LAPAROSCOPY     ETHMOIDECTOMY Right 07/19/2021   Procedure: TOTAL ETHMOIDECTOMY WITH FRONTAL SINUS EXPLORATION;  Surgeon: Edda Mt, MD;  Location: [facility];  Service: ENT;  Laterality: Right;   FOOT SURGERY Bilateral    heer spur and bunion   IMAGE GUIDED SINUS SURGERY N/A 07/19/2021   Procedure: IMAGE GUIDED SINUS SURGERY;  Surgeon: Edda Mt, MD;  Location: [facility];  Service: ENT;  Laterality: N/A;   KNEE ARTHROSCOPY Left 03/02/2015   Procedure: ARTHROSCOPY  LEFT KNEE, PARTIAL LATERAL  MENISECTOMY, SYNOVECTOMY, MEDIAL & LATERAL CHONDROPLASTY;  Surgeon: Franky Cranker, MD;  Location: [facility];  Service: Orthopedics;  Laterality: Left;   LAPAROSCOPIC SALPINGO OOPHERECTOMY Left 11/15/2014   Procedure: LAPAROSCOPIC OOPHORECTOMY;  Surgeon: Archie Savers, MD;  Location: [facility];  Service: Gynecology;  Laterality: Left;   LAPAROSCOPIC SALPINGOOPHERECTOMY Right 09/2013   also with left salpingectomy   LEFT HEART CATH AND CORONARY ANGIOGRAPHY N/A 03/28/2020   Procedure: LEFT HEART CATH AND CORONARY ANGIOGRAPHY;  Surgeon: Darron Deatrice LABOR, MD;  Location: [facility];  Service: Cardiovascular;  Laterality: N/A;   MAXILLARY ANTROSTOMY Right 07/19/2021   Procedure: MAXILLARY ANTROSTOMY WITH REMOVAL TISSUE;  Surgeon: Edda Mt, MD;  Location: [facility];  Service: ENT;  Laterality: Right;   MINOR HEMORRHOIDECTOMY     NASAL TURBINATE REDUCTION Bilateral 07/19/2021   Procedure: INFERIOR TURBINATE REDUCTION;  Surgeon: Edda Mt, MD;  Location: [facility];  Service: ENT;  Laterality: Bilateral;   PULMONARY THROMBECTOMY Bilateral 10/03/2022   Procedure: PULMONARY THROMBECTOMY;  Surgeon:  Marea Selinda RAMAN, MD;  Location: [facility];  Service: Cardiovascular;  Laterality: Bilateral;   SEPTOPLASTY N/A 07/19/2021   Procedure: SEPTOPLASTY;  Surgeon: Edda Mt, MD;  Location: [facility];  Service: ENT;  Laterality: N/A;   TONSILLECTOMY     TOTAL ABDOMINAL HYSTERECTOMY     TRACHELECTOMY N/A 11/15/2014   Procedure: TRACHELECTOMY;  Surgeon: Archie Savers, MD;  Location: [facility];  Service: Gynecology;  Laterality: N/A;   TRIGGER FINGER RELEASE Right    TUBAL LIGATION Bilateral     Current Medications: Current Meds  Medication Sig   Acetaminophen  (TYLENOL  ARTHRITIS PAIN PO) Take 1,300 mg by mouth every 6 (six) hours as needed (Pain).   apixaban  (ELIQUIS ) 5 MG TABS tablet Take 5 mg by mouth 2 (two) times daily.   cyclobenzaprine  (FLEXERIL ) 10 MG tablet Take 1 tablet (10 mg total) by mouth 3 (three) times daily as needed for muscle spasms.   ENTRESTO  24-26 MG Take 1 tablet by mouth 2 (two) times daily.   esomeprazole (NEXIUM) 40 MG capsule Take 40 mg by mouth every morning.    furosemide  (LASIX ) 20 MG tablet Take one tablet (20mg ) by mouth Monday, Wednesday, Friday.  gabapentin  (NEURONTIN ) 100 MG capsule Take 100 mg by mouth 3 (three) times daily.   levothyroxine  (SYNTHROID ) 125 MCG tablet Take 125 mcg by mouth every morning.   Magnesium  250 MG TABS Take 1 tablet by mouth daily.   metoprolol  succinate (TOPROL -XL) 50 MG 24 hr tablet TAKE 1 TABLET BY MOUTH ONCE DAILY WITH  OR  IMMEDIATELY  FOLLOWING  A  MEAL   oxyCODONE -acetaminophen  (PERCOCET/ROXICET) 5-325 MG tablet Take 1 tablet by mouth every 4 (four) hours as needed for severe pain.   sertraline  (ZOLOFT ) 100 MG tablet Take 200 mg by mouth daily.   spironolactone  (ALDACTONE ) 25 MG tablet Take 1 tablet (25 mg total) by mouth daily.   [DISCONTINUED] furosemide  (LASIX ) 20 MG tablet Take 20 mg by mouth every other day.     Allergies:   Sulfa antibiotics, Sulfamethoxazole-trimethoprim, and Jardiance  [empagliflozin ]   Social  History   Socioeconomic History   Marital status: Married    Spouse name: Jimmy   Number of children: 2   Years of education: Not on file   Highest education level: Not on file  Occupational History   Not on file  Tobacco Use   Smoking status: Never   Smokeless tobacco: Never  Vaping Use   Vaping status: Never Used  Substance and Sexual Activity   Alcohol use: Yes    Alcohol/week: 0.0 standard drinks of alcohol    Comment: occassionally   Drug use: No   Sexual activity: Not Currently    Birth control/protection: Surgical  Other Topics Concern   Not on file  Social History Narrative   Not on file   Social Drivers of Health   Financial Resource Strain: Low Risk  (01/04/2023)   Received from [facility]   Overall Financial Resource Strain (CARDIA)    Difficulty of Paying Living Expenses: Not hard at all  Food Insecurity: No Food Insecurity (01/04/2023)   Received from [facility]   Hunger Vital Sign    Worried About Running Out of Food in the Last Year: Never true    Ran Out of Food in the Last Year: Never true  Transportation Needs: No Transportation Needs (01/04/2023)   Received from [facility] - Transportation    In the past 12 months, has lack of transportation kept you from medical appointments or from getting medications?: No    Lack of Transportation (Non-Medical): No  Physical Activity: Not on file  Stress: Not on file  Social Connections: Not on file     Family History: The patient's family history includes Breast cancer (age of onset: 104) in her sister; Colon cancer in her mother; Hyperlipidemia in her mother; Hypertension in her mother; Ovarian cancer in her sister; Stroke in her mother; Throat cancer in her brother.  ROS:   Please see the history of present illness.     All other systems reviewed and are negative.  EKGs/Labs/Other Studies Reviewed:    The following studies were reviewed  today:       Recent Labs: 12/14/2022: ALT 26; BUN 14; Creatinine, Ser 0.89; Hemoglobin 14.2; Platelets 156; Potassium 3.8; Sodium 138  Recent Lipid Panel No results found for: CHOL, TRIG, HDL, CHOLHDL, VLDL, LDLCALC, LDLDIRECT   Risk Assessment/Calculations:             Physical Exam:    VS:  BP 116/88 (BP Location: Left Arm, Patient Position: Sitting, Cuff Size: Large)   Pulse 81   Ht 5'  7 (1.702 m)   Wt 249 lb (112.9 kg)   SpO2 97%   BMI 39.00 kg/m     Wt Readings from Last 3 Encounters:  02/11/23 249 lb (112.9 kg)  01/18/23 247 lb 3.2 oz (112.1 kg)  12/20/22 253 lb (114.8 kg)     GEN:  Well nourished, well developed in no acute distress HEENT: Normal NECK: No JVD; No carotid bruits CARDIAC: RRR, no murmurs, rubs, gallops RESPIRATORY:  Clear to auscultation without rales, wheezing or rhonchi  ABDOMEN: Soft, non-tender, non-distended MUSCULOSKELETAL:  No edema; No deformity  SKIN: Warm and dry NEUROLOGIC:  Alert and oriented x 3 PSYCHIATRIC:  Normal affect   ASSESSMENT:    1. Nonischemic cardiomyopathy (HCC)   2. ICD (implantable cardioverter-defibrillator) in place   3. Morbid obesity (HCC)   4. Pulmonary embolism without acute cor pulmonale, unspecified chronicity, unspecified pulmonary embolism type (HCC)     PLAN:    In order of problems listed above:  Nonischemic cardiomyopathy EF 35% s/p AICD.  Likely Hereditary  with daughter also having heart failure.  Low normal BPs at home.  Euvolemic on exam.  Continue Toprol -XL 50 mg daily, Entresto  24-26 mg twice daily, Aldactone  25 mg daily, Lasix  20 mg every other day.  Appreciate input from heart failure service. S/p AICD, establish care with device clinic. Morbid obesity, history of PEs, heart failure.  Low-calorie diet, weight loss advised.  Wegovy  not approved by insurance. PE s/p thrombectomy.  On Eliquis , follows up with vascular surgery.  Follow-up in 6 months.     Medication  Adjustments/Labs and Tests Ordered: Current medicines are reviewed at length with the patient today.  Concerns regarding medicines are outlined above.  No orders of the defined types were placed in this encounter.  Meds ordered this encounter  Medications   furosemide  (LASIX ) 20 MG tablet    Sig: Take one tablet (20mg ) by mouth Monday, Wednesday, Friday.    Dispense:  36 tablet    Refill:  0    Patient Instructions  Medication Instructions:   Lasix  - Take one tablet ( 20mg ) by mouth Monday, Wednesday, Friday.   *If you need a refill on your cardiac medications before your next appointment, please call your pharmacy*   Lab Work:  None Ordered  If you have labs (blood work) drawn today and your tests are completely normal, you will receive your results only by: MyChart Message (if you have MyChart) OR A paper copy in the mail If you have any lab test that is abnormal or we need to change your treatment, we will call you to review the results.   Testing/Procedures:  None Ordered   Follow-Up: At [facility], you and your health needs are our priority.  As part of our continuing mission to provide you with exceptional heart care, we have created designated Provider Care Teams.  These Care Teams include your primary Cardiologist (physician) and Advanced Practice Providers (APPs -  Physician Assistants and Nurse Practitioners) who all work together to provide you with the care you need, when you need it.  We recommend signing up for the patient portal called MyChart.  Sign up information is provided on this After Visit Summary.  MyChart is used to connect with patients for Virtual Visits (Telemedicine).  Patients are able to view lab/test results, encounter notes, upcoming appointments, etc.  Non-urgent messages can be sent to your provider as well.   To learn more about what you can do with MyChart,  go to [email or web address].    Your next appointment:   6  month(s)  Provider:   You may see Redell Cave, MD or one of the following Advanced Practice Providers on your designated Care Team:   Lonni Meager, NP Bernardino Bring, PA-C Cadence Franchester, PA-C Tylene Lunch, NP Barnie Hila, NP    Signed, Redell Cave, MD  02/12/2023 9:33 AM    [facility]

## 2023-02-14 ENCOUNTER — Telehealth (INDEPENDENT_AMBULATORY_CARE_PROVIDER_SITE_OTHER): Payer: Self-pay

## 2023-02-14 ENCOUNTER — Other Ambulatory Visit: Payer: Self-pay | Admitting: Orthopaedic Surgery

## 2023-02-14 DIAGNOSIS — M25511 Pain in right shoulder: Secondary | ICD-10-CM

## 2023-02-14 NOTE — Telephone Encounter (Signed)
Patient was notified with medical recommendations and verbalized understanding. Patient stated that she will speak with PCP and will contact the office if she plans to proceed with scheduling ultrasound. Patient is waiting to be schedule for laser ablation.

## 2023-02-14 NOTE — Telephone Encounter (Signed)
Patient left a message stating since last week she has been having sharp and stabbing pain in the left groin. Patient was last seen 01/18/23. Please Advise

## 2023-02-14 NOTE — Telephone Encounter (Signed)
When we did her intervention, we didn't touch her left groin, we used her right.  He she can come in for a DVT study only and if it is normal, she should see her PCP for evaluation

## 2023-02-20 DIAGNOSIS — J101 Influenza due to other identified influenza virus with other respiratory manifestations: Secondary | ICD-10-CM | POA: Diagnosis not present

## 2023-02-20 DIAGNOSIS — I5022 Chronic systolic (congestive) heart failure: Secondary | ICD-10-CM | POA: Diagnosis not present

## 2023-02-20 DIAGNOSIS — R051 Acute cough: Secondary | ICD-10-CM | POA: Diagnosis not present

## 2023-02-20 DIAGNOSIS — Z539 Procedure and treatment not carried out, unspecified reason: Secondary | ICD-10-CM | POA: Diagnosis not present

## 2023-02-22 ENCOUNTER — Emergency Department: Payer: Medicare HMO

## 2023-02-22 ENCOUNTER — Emergency Department
Admission: EM | Admit: 2023-02-22 | Discharge: 2023-02-22 | Disposition: A | Discharge status: Home / Self Care | Payer: Medicare HMO | Attending: Emergency Medicine | Admitting: Emergency Medicine

## 2023-02-22 ENCOUNTER — Other Ambulatory Visit: Payer: Self-pay

## 2023-02-22 DIAGNOSIS — J111 Influenza due to unidentified influenza virus with other respiratory manifestations: Secondary | ICD-10-CM

## 2023-02-22 DIAGNOSIS — R1011 Right upper quadrant pain: Secondary | ICD-10-CM

## 2023-02-22 DIAGNOSIS — R079 Chest pain, unspecified: Secondary | ICD-10-CM | POA: Diagnosis not present

## 2023-02-22 DIAGNOSIS — Z20822 Contact with and (suspected) exposure to covid-19: Secondary | ICD-10-CM | POA: Diagnosis not present

## 2023-02-22 DIAGNOSIS — J101 Influenza due to other identified influenza virus with other respiratory manifestations: Secondary | ICD-10-CM | POA: Insufficient documentation

## 2023-02-22 DIAGNOSIS — R5383 Other fatigue: Secondary | ICD-10-CM | POA: Diagnosis not present

## 2023-02-22 DIAGNOSIS — I959 Hypotension, unspecified: Secondary | ICD-10-CM | POA: Diagnosis not present

## 2023-02-22 DIAGNOSIS — Z95810 Presence of automatic (implantable) cardiac defibrillator: Secondary | ICD-10-CM | POA: Diagnosis not present

## 2023-02-22 DIAGNOSIS — R0789 Other chest pain: Secondary | ICD-10-CM

## 2023-02-22 DIAGNOSIS — Z7901 Long term (current) use of anticoagulants: Secondary | ICD-10-CM | POA: Diagnosis not present

## 2023-02-22 DIAGNOSIS — R5381 Other malaise: Secondary | ICD-10-CM

## 2023-02-22 DIAGNOSIS — R059 Cough, unspecified: Secondary | ICD-10-CM | POA: Diagnosis not present

## 2023-02-22 LAB — LIPASE, BLOOD: Lipase: 35 U/L (ref 11–51)

## 2023-02-22 LAB — COMPREHENSIVE METABOLIC PANEL
ALT: 25 U/L (ref 0–44)
AST: 34 U/L (ref 15–41)
Albumin: 4 g/dL (ref 3.5–5.0)
Alkaline Phosphatase: 59 U/L (ref 38–126)
Anion gap: 14 (ref 5–15)
BUN: 19 mg/dL (ref 6–20)
CO2: 21 mmol/L — ABNORMAL LOW (ref 22–32)
Calcium: 9.6 mg/dL (ref 8.9–10.3)
Chloride: 100 mmol/L (ref 98–111)
Creatinine, Ser: 1.06 mg/dL — ABNORMAL HIGH (ref 0.44–1.00)
GFR, Estimated: >60 mL/min (ref >60)
Glucose, Bld: 104 mg/dL — ABNORMAL HIGH (ref 70–99)
Potassium: 4 mmol/L (ref 3.5–5.1)
Sodium: 135 mmol/L (ref 135–145)
Total Bilirubin: 1.1 mg/dL (ref 0.0–1.2)
Total Protein: 7.9 g/dL (ref 6.5–8.1)

## 2023-02-22 LAB — TROPONIN I (HIGH SENSITIVITY)
Troponin I (High Sensitivity): 2 ng/L (ref <18)
Troponin I (High Sensitivity): 2 ng/L (ref <18)

## 2023-02-22 LAB — CBC WITH DIFFERENTIAL/PLATELET
Abs Immature Granulocytes: 0.01 10*3/uL (ref 0.00–0.07)
Basophils Absolute: 0 10*3/uL (ref 0.0–0.1)
Basophils Relative: 0 %
Eosinophils Absolute: 0 10*3/uL (ref 0.0–0.5)
Eosinophils Relative: 0 %
HCT: 45.5 % (ref 36.0–46.0)
Hemoglobin: 15.1 g/dL — ABNORMAL HIGH (ref 12.0–15.0)
Immature Granulocytes: 0 %
Lymphocytes Relative: 27 %
Lymphs Abs: 1.3 10*3/uL (ref 0.7–4.0)
MCH: 30.1 pg (ref 26.0–34.0)
MCHC: 33.2 g/dL (ref 30.0–36.0)
MCV: 90.6 fL (ref 80.0–100.0)
Monocytes Absolute: 0.4 10*3/uL (ref 0.1–1.0)
Monocytes Relative: 7 %
Neutro Abs: 3.1 10*3/uL (ref 1.7–7.7)
Neutrophils Relative %: 66 %
Platelets: 142 10*3/uL — ABNORMAL LOW (ref 150–400)
RBC: 5.02 MIL/uL (ref 3.87–5.11)
RDW: 14.3 % (ref 11.5–15.5)
WBC: 4.7 10*3/uL (ref 4.0–10.5)
nRBC: 0 % (ref 0.0–0.2)

## 2023-02-22 LAB — PROTIME-INR
INR: 1.1 (ref 0.8–1.2)
Prothrombin Time: 14.8 s (ref 11.4–15.2)

## 2023-02-22 LAB — RESP PANEL BY RT-PCR (RSV, FLU A&B, COVID)  RVPGX2
Influenza A by PCR: POSITIVE — AB
Influenza B by PCR: NEGATIVE
Resp Syncytial Virus by PCR: NEGATIVE
SARS Coronavirus 2 by RT PCR: NEGATIVE

## 2023-02-22 LAB — D-DIMER, QUANTITATIVE: D-Dimer, Quant: 0.39 ug{FEU}/mL (ref 0.00–0.50)

## 2023-02-22 MED ORDER — GUAIFENESIN-CODEINE 100-10 MG/5ML PO SOLN: 5.0000 mL | Freq: Four times a day (QID) | ORAL | 0 refills | Status: DC | PRN

## 2023-02-22 NOTE — ED Notes (Signed)
Pt ambulates to bedside toilet. Pending imaging.

## 2023-02-22 NOTE — ED Provider Notes (Signed)
-----------------------------------------   8:58 AM on 02/22/2023 ----------------------------------------- Patient care assumed from Dr. York Cerise.  Patient's workup shows influenza A positive otherwise reassuring workup negative troponin x 2, reassuring CBC with a normal white blood cell count, reassuring chemistry, negative D-dimer, ultrasound the right upper quadrant shows a normal right upper quadrant, chemistry shows no LFT or lipase elevation.  Chest x-ray shows no acute finding.  Given the patient's reassuring workup highly suspect her discomfort is likely due to her influenza A infection/viral syndrome.  We will discharge with supportive care.  Provided my typical influenza and chest pain return precautions.  Patient agreeable to plan.   Minna Antis, MD 02/22/23 [phone]

## 2023-02-22 NOTE — ED Notes (Signed)
Pt requesting pt check her ears prior to discharge due to increased ear pain. EDP made aware. Discharge pending at this time.

## 2023-02-22 NOTE — ED Triage Notes (Signed)
Since Tuesday has been having flu like symptoms, went to PCP and flu positive. C/o nonradiating left anterior CP starting at 1am, +thinners, worsens when walks. States does not feel the same as previous blood clot.

## 2023-02-22 NOTE — ED Provider Notes (Signed)
[facility] Provider Note    Event Date/Time   First MD Initiated Contact with Patient 02/22/23 0500   (approximate)   History   Chest Pain   HPI Lisa Fuentes is a 63 y.o. female who presents for evaluation of chest pain.  She reports that she was diagnosed with flu about 2 days ago after developing symptoms 3 days ago including general malaise, fatigue, generalized bodyaches, cough, nasal congestion, etc.  She came to the emergency department tonight because she began to have some chest pain in her right lower part of her chest.  She is very frustrated by how ill she feels in general and then was worried about the chest pain.  She has a history of blood clots in her legs for which she takes Eliquis and was concerned that they might also be in her lungs.     Physical Exam   Triage Vital Signs: ED Triage Vitals  Encounter Vitals Group     BP 02/22/23 0451 (!) 143/114     Systolic BP Percentile --      Diastolic BP Percentile --      Pulse Rate 02/22/23 0451 95     Resp 02/22/23 0451 18     Temp 02/22/23 0451 98.4 F (36.9 C)     Temp src --      SpO2 02/22/23 0451 96 %     Weight 02/22/23 0456 111.1 kg (245 lb)     Height 02/22/23 0456 1.702 m (5\' 7" )     Head Circumference --      Peak Flow --      Pain Score 02/22/23 0455 8     Pain Loc --      Pain Education --      Exclude from Growth Chart --     Most recent vital signs: Vitals:   02/22/23 0600 02/22/23 0730  BP: 100/64 113/81  Pulse: 93 99  Resp: (!) 22 17  Temp:    SpO2: 95% 91%    General: Awake, appears generally well despite viral symptoms. CV:  Good peripheral perfusion.  Normal heart sounds.  Borderline tachycardia, regular rhythm. Resp:  Normal effort. Speaking easily and comfortably, no accessory muscle usage nor intercostal retractions.  Lungs are clear to auscultation. Abd:  Patient has tenderness to palpation with guarding and equivocal Murphy sign in the right upper  quadrant.  No lower abdominal tenderness.  Mild epigastric tenderness.   ED Results / Procedures / Treatments   Labs (all labs ordered are listed, but only abnormal results are displayed) Labs Reviewed  CBC WITH DIFFERENTIAL/PLATELET - Abnormal; Notable for the following components:      Result Value   Hemoglobin 15.1 (*)    Platelets 142 (*)    All other components within normal limits  COMPREHENSIVE METABOLIC PANEL - Abnormal; Notable for the following components:   CO2 21 (*)    Glucose, Bld 104 (*)    Creatinine, Ser 1.06 (*)    All other components within normal limits  RESP PANEL BY RT-PCR (RSV, FLU A&B, COVID)  RVPGX2  LIPASE, BLOOD  D-DIMER, QUANTITATIVE  PROTIME-INR  TROPONIN I (HIGH SENSITIVITY)  TROPONIN I (HIGH SENSITIVITY)     EKG  ED ECG REPORT I, Loleta Rose, the attending physician, personally viewed and interpreted this ECG.  Date: 02/22/2023 EKG Time: 5:31 AM Rate: 92 Rhythm: normal sinus rhythm QRS Axis: normal Intervals: normal ST/T Wave abnormalities: Non-specific ST segment / T-wave changes,  but no clear evidence of acute ischemia. Narrative Interpretation: no definitive evidence of acute ischemia; does not meet STEMI criteria.    RADIOLOGY I viewed and interpreted the patient's chest x-ray and I see no evidence of pneumonia or widened mediastinum.  Radiologist mentions cardiomegaly but no acute findings.  Ultrasound of the right upper quadrant pending at time of transfer of care   PROCEDURES:  Critical Care performed: No  .1-3 Lead EKG Interpretation  Performed by: Loleta Rose, MD Authorized by: Loleta Rose, MD     Interpretation: normal     ECG rate:  98   ECG rate assessment: normal     Rhythm: sinus rhythm     Ectopy: none     Conduction: normal       IMPRESSION / MDM / ASSESSMENT AND PLAN / ED COURSE  I reviewed the triage vital signs and the nursing notes.                              Differential diagnosis  includes, but is not limited to, Malaise and other symptoms associated with influenza, PE, ACS, biliary colic.  Patient's presentation is most consistent with acute presentation with potential threat to life or bodily function.  Labs/studies ordered: Chest x-ray, EKG, right upper quadrant ultrasound, CBC with differential, pro time-INR, high-sensitivity troponin x 2, CMP, lipase, D-dimer, respiratory viral panel  Interventions/Medications given:  Medications - No data to display  (Note:  hospital course my include additional interventions and/or labs/studies not listed above.)   The patient is generally well appearing despite her viral symptoms.  Vital signs are stable within normal limits, borderline tachycardia but otherwise normal.  She has substantial tenderness to palpation of the right upper quadrant making me wonder about biliary colic which I will investigate with an ultrasound.  The patient is on the cardiac monitor to evaluate for evidence of arrhythmia and/or significant heart rate changes.  No ischemic changes on EKG and first high-sensitivity troponin is within normal limits along with most of the rest of her labs.  Of note, her D-dimer is within normal limits despite having flu, prior history of blood clots, and being on Eliquis.  I find this very reassuring and there is no indication to obtain a CTA chest to rule out PE unless additional information comes to light.  I am transferring ED care to Dr. Lenard Lance to follow-up on the ultrasound and the repeat high-sensitivity troponin and to reassess the patient to determine the appropriate disposition plan.       FINAL CLINICAL IMPRESSION(S) / ED DIAGNOSES   Final diagnoses:  Influenza  RUQ pain  Atypical chest pain  Malaise and fatigue     Rx / DC Orders   ED Discharge Orders     None        Note:  This document was prepared using Dragon voice recognition software and may include unintentional dictation  errors.   Loleta Rose, MD 02/22/23 [phone]

## 2023-02-26 ENCOUNTER — Ambulatory Visit
Admission: RE | Admit: 2023-02-26 | Discharge: 2023-02-26 | Disposition: A | Discharge status: Home / Self Care | Payer: Medicare HMO | Source: Ambulatory Visit | Attending: Orthopaedic Surgery | Admitting: Orthopaedic Surgery

## 2023-02-26 DIAGNOSIS — M19011 Primary osteoarthritis, right shoulder: Secondary | ICD-10-CM | POA: Diagnosis not present

## 2023-02-26 DIAGNOSIS — M25511 Pain in right shoulder: Secondary | ICD-10-CM | POA: Insufficient documentation

## 2023-02-26 MED ORDER — SODIUM CHLORIDE (PF) 0.9% IJ SOLUTION - NO CHARGE
20.0000 mL | INTRAMUSCULAR | Status: DC | PRN
  Administered 2023-02-26: 5 mL via INTRAVENOUS
  Filled 2023-02-26: qty 20

## 2023-02-26 MED ORDER — IOHEXOL 180 MG/ML  SOLN
20.0000 mL | Freq: Once | INTRAMUSCULAR | Status: AC | PRN
  Administered 2023-02-26: 15 mL via INTRAVENOUS

## 2023-02-26 MED ORDER — LIDOCAINE HCL (PF) 1 % IJ SOLN
10.0000 mL | Freq: Once | INTRAMUSCULAR | Status: AC
  Administered 2023-02-26: 4 mL
  Filled 2023-02-26: qty 10

## 2023-02-27 ENCOUNTER — Institutional Professional Consult (permissible substitution): Admitting: Cardiology

## 2023-02-28 DIAGNOSIS — R300 Dysuria: Secondary | ICD-10-CM | POA: Diagnosis not present

## 2023-03-01 DIAGNOSIS — M25511 Pain in right shoulder: Secondary | ICD-10-CM | POA: Diagnosis not present

## 2023-03-06 ENCOUNTER — Other Ambulatory Visit (INDEPENDENT_AMBULATORY_CARE_PROVIDER_SITE_OTHER): Payer: Self-pay

## 2023-03-06 ENCOUNTER — Telehealth (INDEPENDENT_AMBULATORY_CARE_PROVIDER_SITE_OTHER): Payer: Self-pay | Admitting: Vascular Surgery

## 2023-03-06 MED ORDER — XANAX 0.5 MG PO TABS: ORAL_TABLET | ORAL | 0 refills | Status: DC

## 2023-03-06 NOTE — Telephone Encounter (Signed)
 Xanax  sent for 04/05/23 Laser

## 2023-03-06 NOTE — Telephone Encounter (Signed)
 Spoke with pt to book her laser ablation. She is scheduled for 3.7.25. She will need the standard protocol RX for the laser procedure. Her pharmacy is [facility] [address] . Thank you!

## 2023-03-11 DIAGNOSIS — Z87440 Personal history of urinary (tract) infections: Secondary | ICD-10-CM | POA: Diagnosis not present

## 2023-03-11 DIAGNOSIS — F3341 Major depressive disorder, recurrent, in partial remission: Secondary | ICD-10-CM | POA: Diagnosis not present

## 2023-03-11 DIAGNOSIS — E0590 Thyrotoxicosis, unspecified without thyrotoxic crisis or storm: Secondary | ICD-10-CM | POA: Diagnosis not present

## 2023-03-11 DIAGNOSIS — I5022 Chronic systolic (congestive) heart failure: Secondary | ICD-10-CM | POA: Diagnosis not present

## 2023-03-11 DIAGNOSIS — E785 Hyperlipidemia, unspecified: Secondary | ICD-10-CM | POA: Diagnosis not present

## 2023-03-11 DIAGNOSIS — I4729 Other ventricular tachycardia: Secondary | ICD-10-CM | POA: Diagnosis not present

## 2023-03-11 DIAGNOSIS — Z0000 Encounter for general adult medical examination without abnormal findings: Secondary | ICD-10-CM | POA: Diagnosis not present

## 2023-03-11 DIAGNOSIS — I110 Hypertensive heart disease with heart failure: Secondary | ICD-10-CM | POA: Diagnosis not present

## 2023-03-11 DIAGNOSIS — M25511 Pain in right shoulder: Secondary | ICD-10-CM | POA: Diagnosis not present

## 2023-03-13 ENCOUNTER — Telehealth: Payer: Self-pay | Admitting: Internal Medicine

## 2023-03-13 NOTE — Telephone Encounter (Signed)
Lvm to sched recall f/u w/ Bensimhon

## 2023-03-19 ENCOUNTER — Ambulatory Visit: Payer: Medicare HMO

## 2023-03-19 ENCOUNTER — Telehealth: Payer: Self-pay | Admitting: Cardiology

## 2023-03-19 NOTE — Telephone Encounter (Signed)
   Pre-operative Risk Assessment    Patient Name: Lisa Fuentes  DOB: 12-16-1960 MRN: [id number]   Date of last office visit: 02/11/23 Date of next office visit: 03/26/23  Request for Surgical Clearance    Procedure:  Dental Extraction - Amount of Teeth to be Pulled:  1  Date of Surgery:  Clearance TBD                                Surgeon:  Dr. Lorayne Bender, DDS Surgeon's Group or Practice Name:  [facility] Phone number:  [phone] Fax number:  [phone]   Type of Clearance Requested:   - Medical    Type of Anesthesia:   4% Articaine 1:100,000 with Epinephrine   Additional requests/questions:    [name]   03/19/2023, 4:04 PM

## 2023-03-19 NOTE — Telephone Encounter (Signed)
   Patient Name: Lisa Fuentes  DOB: 05-Jul-1960 MRN: [id number]  Primary Cardiologist: Debbe Odea, MD  Chart reviewed as part of pre-operative protocol coverage.   Dental extractions of 1-2 teeth are considered low risk procedures per guidelines and generally do not require any specific cardiac clearance. It is also generally accepted that for extractions of 1-2 teeth and dental cleanings, there is no need to interrupt blood thinner therapy.  SBE prophylaxis is not required for the patient from a cardiac standpoint.  I will route this recommendation to the requesting party via Epic fax function and remove from pre-op pool.  Please call with questions.  Carlos Levering, NP 03/19/2023, 4:55 PM

## 2023-03-22 ENCOUNTER — Telehealth: Payer: Self-pay | Admitting: Cardiology

## 2023-03-22 DIAGNOSIS — M75121 Complete rotator cuff tear or rupture of right shoulder, not specified as traumatic: Secondary | ICD-10-CM | POA: Diagnosis not present

## 2023-03-22 NOTE — Telephone Encounter (Signed)
   Pre-operative Risk Assessment    Patient Name: Lisa Fuentes  DOB: [birth year] MRN: [id number]   Date of last office visit: 02/11/23 Date of next office visit: 03/26/23   Request for Surgical Clearance    Procedure:   right rotator cuff repair  Date of Surgery:  Clearance 05/01/23                                Surgeon:  Dr. Mena Goes Surgeon's Group or Practice Name:  [facility] Phone number:  [phone]  Fax number:   [phone]      Type of Clearance Requested:   - Medical    Type of Anesthesia:  Not Indicated/illegible   Additional requests/questions:    [name]   03/22/2023, 4:54 PM

## 2023-03-25 DIAGNOSIS — E0590 Thyrotoxicosis, unspecified without thyrotoxic crisis or storm: Secondary | ICD-10-CM | POA: Diagnosis not present

## 2023-03-25 DIAGNOSIS — E785 Hyperlipidemia, unspecified: Secondary | ICD-10-CM | POA: Diagnosis not present

## 2023-03-25 DIAGNOSIS — I10 Essential (primary) hypertension: Secondary | ICD-10-CM | POA: Diagnosis not present

## 2023-03-25 NOTE — Telephone Encounter (Signed)
 I called [phone], though this went to [facility], but was entered in as [facility] doing surgery.   I called [facility] # for Dr. Franco Collet [phone]. I confirmed I had to correct office for the pt.   CORRECTIONS TO CLEARANCE REQUEST:   PH# [phone] FAX# [phone]  ANESTHESIA: MAC  ELIQUIS HOLD INSTRUCTIONS NEEDED

## 2023-03-25 NOTE — Telephone Encounter (Signed)
 Good morning,  Callback team please contact requesting provider's office for information on type of anesthesia being used for upcoming right rotator cuff repair.  Thank you

## 2023-03-25 NOTE — Telephone Encounter (Signed)
 Dr. Azucena Cecil  We have received a surgical clearance request for Lisa Fuentes who will be undergoing a right rotator cuff repair.. They were seen recently in clinic on 02/11/2023. Can you please comment on surgical clearance and guidance for her upcoming shoulder procedure. Please forward you guidance and recommendations to P [facility] PREOP  FYI device clinic patient has Medtronic ICD in place  Thank you, Robin Searing, NP

## 2023-03-25 NOTE — Telephone Encounter (Signed)
 Patient is followed in [facility] if clearance is needed.

## 2023-03-26 ENCOUNTER — Ambulatory Visit: Payer: Medicare HMO | Attending: Cardiology | Admitting: Cardiology

## 2023-03-26 ENCOUNTER — Encounter: Payer: Self-pay | Admitting: Cardiology

## 2023-03-26 VITALS — BP 116/72 | HR 94 | Ht 67.0 in | Wt 248.0 lb

## 2023-03-26 DIAGNOSIS — Z95810 Presence of automatic (implantable) cardiac defibrillator: Secondary | ICD-10-CM

## 2023-03-26 DIAGNOSIS — Z86711 Personal history of pulmonary embolism: Secondary | ICD-10-CM

## 2023-03-26 DIAGNOSIS — I428 Other cardiomyopathies: Secondary | ICD-10-CM | POA: Diagnosis not present

## 2023-03-26 DIAGNOSIS — R600 Localized edema: Secondary | ICD-10-CM

## 2023-03-26 DIAGNOSIS — I5022 Chronic systolic (congestive) heart failure: Secondary | ICD-10-CM

## 2023-03-26 DIAGNOSIS — Z86718 Personal history of other venous thrombosis and embolism: Secondary | ICD-10-CM

## 2023-03-26 DIAGNOSIS — I4729 Other ventricular tachycardia: Secondary | ICD-10-CM

## 2023-03-26 MED ORDER — METOPROLOL SUCCINATE ER 50 MG PO TB24: 75.0000 mg | ORAL_TABLET | Freq: Every day | ORAL | 3 refills | Status: DC

## 2023-03-26 NOTE — Progress Notes (Unsigned)
 Electrophysiology Office Note:   Date:  03/27/2023  ID:  Lisa Fuentes, DOB 1960-02-20, MRN [id number]  Primary Cardiologist: Debbe Odea, MD Electrophysiologist: Sherryl Manges, MD      History of Present Illness:   Lisa Fuentes is a 63 y.o. female with h/o NICM s/p ICD 2008, GEN change 2015, hypertension, hyperlipidemia, PE s/p endovascular thrombolysis 9/24 on Eliquis, OSA who is being seen today for to establish care for her ICD.  Discussed the use of AI scribe software for clinical note transcription with the patient, who gave verbal consent to proceed.  History of Present Illness   She has been experiencing fluid retention in her lower extremities. She has been taking lasix on 3 times per week, decreased from once daily, after having some lower extremity cramps. She also reports intermittent palpitations. The patient's defibrillator device is not MRI compatible due to having a MDT generator and BSX ICD lead. She expresses concern about her inability to have an MRI due to the incompatibility of her defibrillator device, especially given her family history of cancer. The patient is also planning for a tooth extraction and shoulder surgery.     Review of systems complete and found to be negative unless listed in HPI.   EP Information / Studies Reviewed:    EKG is not ordered today. EKG from 02/22/23 reviewed which showed sinus rhythm, PR , QRS 76ms.      Nuclear Stress 01/09/23: Pharmacological myocardial perfusion imaging study with very small region of mild ischemia in the distal anterior wall (unable to exclude attenuation artifact) apical wall hypokinesis, EF estimated at 40% No EKG changes concerning for ischemia at peak stress or in recovery. CT attenuation correction images detailing pacing wire, no significant aortic atherosclerosis or coronary calcification Low to moderate risk scan  Echo 12/13/22:  1. Left ventricular ejection fraction, by estimation, is 25  to 30%. Left  ventricular ejection fraction by 2D MOD biplane is 29.9 %. The left  ventricle has severely decreased function. The left ventricle demonstrates  global hypokinesis. The left  ventricular internal cavity size was moderately dilated. Left ventricular  diastolic parameters are consistent with Grade I diastolic dysfunction  (impaired relaxation). The average left ventricular global longitudinal  strain is -7.9 %.   2. Right ventricular systolic function is normal. The right ventricular  size is normal.   3. The mitral valve is normal in structure. No evidence of mitral valve  regurgitation. No evidence of mitral stenosis.   4. The aortic valve is tricuspid. Aortic valve regurgitation is not  visualized. No aortic stenosis is present.   5. The inferior vena cava is normal in size with greater than 50%  respiratory variability, suggesting right atrial pressure of 3 mmHg.    Physical Exam:   VS:  BP 116/72   Pulse 94   Ht 5\' 7"  (1.702 m)   Wt 248 lb (112.5 kg)   SpO2 96%   BMI 38.84 kg/m    Wt Readings from Last 3 Encounters:  03/26/23 248 lb (112.5 kg)  02/22/23 245 lb (111.1 kg)  02/11/23 249 lb (112.9 kg)     GEN: Well nourished, well developed in no acute distress NECK: No JVD CARDIAC: Normal rate, regular rhythm. L chest pacer pocket well healed. RESPIRATORY:  Clear to auscultation without rales, wheezing or rhonchi  ABDOMEN: Soft, non-distended EXTREMITIES:  No edema; No deformity   ASSESSMENT AND PLAN:    #.Implantable Cardioverter Defibrillator (ICD) Management: The patient has an MRI-incompatible  ICD with a Medtronic battery and [employer] lead. The battery has approximately 2.5 years of life remaining. Discussed MRI incompatibility due to mixed components and the plan would be to replace the generator with a BSX one to restore MRI compatibility at time of next gen change. - In clinic device check performed with appropriate function and stable lead  parameters. Check notable for increased fluid levels based on HF diagnostics and some epiosdes of NSVT.  - Continue with scheduled remote checks.  #. NSVT: Paroxysmal Ventricular Tachycardia - Increase metoprolol to 75 mg once daily - Monitor for reduction in frequency of tachycardia episodes  #. Chronic systolic heart failure: Abnormal HF diagnostics on ICD check, suggestive of some fluid retention. #. NICM:  #. Peripheral edema: - Take Lasix daily for one week. - Follow up with Dr. Azucena Cecil for further management.  #. History of DVT and PE: The patient has a history of multiple clots, including DVT and PE, and is currently on Eliquis. Discussed minimizing time off blood thinners for surgical procedures to prevent further clot formation. - Continue Eliquis as prescribed   Follow up with Dr. Jimmey Ralph in 6 months  Signed, Nobie Putnam, MD

## 2023-03-26 NOTE — Progress Notes (Signed)
 PERIOPERATIVE PRESCRIPTION FOR IMPLANTED CARDIAC DEVICE PROGRAMMING  Patient Information: Name:  Lisa Fuentes  DOB:  05/01/1960  MRN:  [id number]    Date of last office visit: 03/26/23   Request for Surgical Clearance    Procedure:   right rotator cuff repair   Date of Surgery:  Clearance 05/01/23                                  Surgeon:  Dr. Mena Goes Surgeon's Group or Practice Name:  [facility] Phone number:  [phone]  Fax number:   [phone]       Type of Clearance Requested:   - Medical/ device   Type of Anesthesia:  Not Indicated/illegible   Additional requests/questions:    Device Information:  Clinic EP Physician:  Michele Rockers, MD  Device Type:  Defibrillator Manufacturer and Phone #:  Medtronic: [phone] Pacemaker Dependent?:  No. Date of Last Device Check:  03/26/23 Normal Device Function?:  Yes.    Electrophysiologist's Recommendations:  Have magnet available. Provide continuous ECG monitoring when magnet is used or reprogramming is to be performed.  Procedure may interfere with device function.  Magnet should be placed over device during procedure.  Per Device Clinic Standing Orders, Skip Mayer, RN  4:09 PM 03/26/2023

## 2023-03-26 NOTE — Progress Notes (Signed)
 Erroneous encounter

## 2023-03-26 NOTE — Patient Instructions (Signed)
 Medication Instructions:  Your physician has recommended you make the following change in your medication:  1) INCREASE metoprolol to 75 mg daily  *If you need a refill on your cardiac medications before your next appointment, please call your pharmacy*  Follow-Up: At [facility], you and your health needs are our priority.  As part of our continuing mission to provide you with exceptional heart care, we have created designated Provider Care Teams.  These Care Teams include your primary Cardiologist (physician) and Advanced Practice Providers (APPs -  Physician Assistants and Nurse Practitioners) who all work together to provide you with the care you need, when you need it.  Your next appointment:   6 months  Provider:   You may see Nobie Putnam, MD or one of the following Advanced Practice Providers on your designated Care Team:   Francis Dowse, [place] [address] [place], [place] Sherie Don, NP Canary Brim, NP

## 2023-03-26 NOTE — Telephone Encounter (Addendum)
   Patient Name: Lisa Fuentes  DOB: 16-May-1960 MRN: [id number]  Primary Cardiologist: Debbe Odea, MD  Chart reviewed as part of pre-operative protocol coverage. Given past medical history and time since last visit, based on ACC/AHA guidelines, ROBERT SUNGA is at acceptable risk for the planned procedure without further cardiovascular testing.   The patient was advised that if she develops new symptoms prior to surgery to contact our office to arrange for a follow-up visit, and she verbalized understanding.  Patient's Eliquis is not managed by cardiology.  I will route this recommendation to the requesting party via Epic fax function and remove from pre-op pool.  Please call with questions.  Napoleon Form, Leodis Rains, NP 03/26/2023, 8:54 AM

## 2023-03-27 DIAGNOSIS — M461 Sacroiliitis, not elsewhere classified: Secondary | ICD-10-CM | POA: Diagnosis not present

## 2023-04-01 LAB — CUP PACEART REMOTE DEVICE CHECK
Battery Remaining Longevity: 28 mo
Battery Voltage: 2.95 V
Brady Statistic RV Percent Paced: 0.01 %
Date Time Interrogation Session: 20250211222404
HighPow Impedance: 48 Ohm
HighPow Impedance: 58 Ohm
Implantable Lead Connection Status: 753985
Implantable Lead Implant Date: 20150508
Implantable Lead Location: 753860
Implantable Lead Model: 185
Implantable Lead Serial Number: 194163
Implantable Pulse Generator Implant Date: 20150508
Lead Channel Impedance Value: 456 Ohm
Lead Channel Impedance Value: 456 Ohm
Lead Channel Pacing Threshold Amplitude: 0.5 V
Lead Channel Pacing Threshold Pulse Width: 0.4 ms
Lead Channel Sensing Intrinsic Amplitude: 8.875 mV
Lead Channel Sensing Intrinsic Amplitude: 8.875 mV
Lead Channel Setting Pacing Amplitude: 2 V
Lead Channel Setting Pacing Pulse Width: 0.4 ms
Lead Channel Setting Sensing Sensitivity: 0.3 mV
Zone Setting Status: 755011

## 2023-04-04 ENCOUNTER — Telehealth (INDEPENDENT_AMBULATORY_CARE_PROVIDER_SITE_OTHER): Payer: Self-pay

## 2023-04-04 NOTE — Telephone Encounter (Signed)
 Ok

## 2023-04-04 NOTE — Telephone Encounter (Signed)
 Patient called asking was there some kind of medication to relax her during the Laser. After checking her chart, the Rx was done but it was printed. I was able to call in the 2 0.5 mg Xanax in to [address] with instructions to take them.

## 2023-04-05 ENCOUNTER — Ambulatory Visit (INDEPENDENT_AMBULATORY_CARE_PROVIDER_SITE_OTHER): Payer: Medicare HMO | Admitting: Vascular Surgery

## 2023-04-05 VITALS — BP 115/74 | HR 73 | Resp 17 | Ht 67.0 in | Wt 251.0 lb

## 2023-04-05 DIAGNOSIS — I83893 Varicose veins of bilateral lower extremities with other complications: Secondary | ICD-10-CM

## 2023-04-05 NOTE — Progress Notes (Signed)
 Lisa Fuentes is a 63 y.o. female who presents with symptomatic venous reflux  Past Medical History:  Diagnosis Date   AICD (automatic cardioverter/defibrillator) present 2008   a.) Medtronic device placed 2008. b.) replaced 06/21/2013 (Medtronic Evera XT single-chamber ICD; model number VRDVBB1D1).   Anemia    Anxiety    Aortic dilatation (HCC) 03/24/2019   a.) TTE 03/24/2019: Ao root measured 37 mm. b.) TTE 03/21/2020: Ao root measured 37 mm.   Arthritis    Depression    Difficult intubation    DOE (dyspnea on exertion)    Endometriosis of vagina 09/2013   Intra-operative findings of endometriosis implants on cervical stump   Essential hypertension    GERD (gastroesophageal reflux disease)    Headache    HFrEF (heart failure with reduced ejection fraction) (HCC)    a.) TTE 02/18/2014: EF 40-45%; tiv AR, mild MR/TR/PR; G1DD. b.) TTE 02/22/2017: EF 45-50%; mild LVH; triv PR; G1DD. c.) TTE 03/24/2019: EF 45-50%; glob HK, mild LVH; triv MR/TR/PR. d.) TTE 03/21/2020: EF 40-45%; glob HK.   History of 2019 novel coronavirus disease (COVID-19) 02/02/2020   History of 2019 novel coronavirus disease (COVID-19) 02/02/2020   Hypokalemia    Hypothyroidism    a.) s/p radioactive iodine Tx; on levothyroxine   Mixed hyperlipidemia    Nonischemic cardiomyopathy (HCC)    a.) TTE 02/18/2014: EF 40-45%. b.) TTE 02/22/2017: EF 45-50%. c.) TTE 03/24/2019: EF 45-50%. d.) TTE 03/21/2020: EF 40-45%   OSA on CPAP    Panic attacks    PSVT (paroxysmal supraventricular tachycardia) (HCC) 03/30/2020   a.) Holter 03/30/2020 --> 2 runs; fastest lasting 4 beats (184 bpm); longest lasting 4 beats (113 bpm)   Pulmonary embolism (HCC)    Tachycardia     Past Surgical History:  Procedure Laterality Date   ANTERIOR AND POSTERIOR REPAIR N/A 11/25/2017   Procedure: ANTERIOR (CYSTOCELE) AND POSTERIOR REPAIR (RECTOCELE);  Surgeon: Hildred Laser, MD;  Location: [facility] ORS;  Service: Gynecology;  Laterality: N/A;    BREAST CYST ASPIRATION Left 03/22/2014   neg/ done by Dr Evette Cristal   CARDIAC CATHETERIZATION  2003   [facility]: No significant coronary artery disease with reduced ejection fraction.   CARDIAC DEFIBRILLATOR PLACEMENT  04/2006   replaced 05/2013   CARDIAC DEFIBRILLATOR PLACEMENT  06/21/2013   Procedure:  CARDIAC DEFIBRILLATOR PLACEMENT (Medtronic single-chamber ICD, model number Lianne Moris, model number [id number]): Location: [facility]; Surgeon: Gerre Pebbles, MD   CARPAL TUNNEL RELEASE Right    COLONOSCOPY  2015   COLONOSCOPY N/A 10/13/2020   Procedure: COLONOSCOPY;  Surgeon: Jaynie Collins, DO;  Location: [facility] ENDOSCOPY;  Service: Gastroenterology;  Laterality: N/A;   COLONOSCOPY WITH PROPOFOL N/A 10/04/2015   Procedure: COLONOSCOPY WITH PROPOFOL;  Surgeon: Midge Minium, MD;  Location: [facility] ENDOSCOPY;  Service: Endoscopy;  Laterality: N/A;   CORONARY ANGIOPLASTY     CYSTOSCOPY  11/15/2014   Procedure: CYSTOSCOPY;  Surgeon: Hildred Laser, MD;  Location: [facility] ORS;  Service: Gynecology;;   DIAGNOSTIC LAPAROSCOPY     ETHMOIDECTOMY Right 07/19/2021   Procedure: TOTAL ETHMOIDECTOMY WITH FRONTAL SINUS EXPLORATION;  Surgeon: Vernie Murders, MD;  Location: [facility] ORS;  Service: ENT;  Laterality: Right;   FOOT SURGERY Bilateral    heer spur and bunion   IMAGE GUIDED SINUS SURGERY N/A 07/19/2021   Procedure: IMAGE GUIDED SINUS SURGERY;  Surgeon: Vernie Murders, MD;  Location: [facility] ORS;  Service: ENT;  Laterality: N/A;   KNEE ARTHROSCOPY Left 03/02/2015   Procedure: ARTHROSCOPY  LEFT KNEE, PARTIAL  LATERAL  MENISECTOMY, SYNOVECTOMY, MEDIAL & LATERAL CHONDROPLASTY;  Surgeon: Juanell Fairly, MD;  Location: [facility] ORS;  Service: Orthopedics;  Laterality: Left;   LAPAROSCOPIC SALPINGO OOPHERECTOMY Left 11/15/2014   Procedure: LAPAROSCOPIC OOPHORECTOMY;  Surgeon: Hildred Laser, MD;  Location: [facility] ORS;  Service: Gynecology;  Laterality: Left;   LAPAROSCOPIC SALPINGOOPHERECTOMY Right 09/2013   also with left  salpingectomy   LEFT HEART CATH AND CORONARY ANGIOGRAPHY N/A 03/28/2020   Procedure: LEFT HEART CATH AND CORONARY ANGIOGRAPHY;  Surgeon: Iran Ouch, MD;  Location: [facility] INVASIVE CV LAB;  Service: Cardiovascular;  Laterality: N/A;   MAXILLARY ANTROSTOMY Right 07/19/2021   Procedure: MAXILLARY ANTROSTOMY WITH REMOVAL TISSUE;  Surgeon: Vernie Murders, MD;  Location: [facility] ORS;  Service: ENT;  Laterality: Right;   MINOR HEMORRHOIDECTOMY     NASAL TURBINATE REDUCTION Bilateral 07/19/2021   Procedure: INFERIOR TURBINATE REDUCTION;  Surgeon: Vernie Murders, MD;  Location: [facility];  Service: ENT;  Laterality: Bilateral;   PULMONARY THROMBECTOMY Bilateral 10/03/2022   Procedure: PULMONARY THROMBECTOMY;  Surgeon: Annice Needy, MD;  Location: [facility] INVASIVE CV LAB;  Service: Cardiovascular;  Laterality: Bilateral;   SEPTOPLASTY N/A 07/19/2021   Procedure: SEPTOPLASTY;  Surgeon: Vernie Murders, MD;  Location: [facility] ORS;  Service: ENT;  Laterality: N/A;   TONSILLECTOMY     TOTAL ABDOMINAL HYSTERECTOMY     TRACHELECTOMY N/A 11/15/2014   Procedure: TRACHELECTOMY;  Surgeon: Hildred Laser, MD;  Location: [facility] ORS;  Service: Gynecology;  Laterality: N/A;   TRIGGER FINGER RELEASE Right    TUBAL LIGATION Bilateral      Current Outpatient Medications:    Acetaminophen (TYLENOL ARTHRITIS PAIN PO), Take 1,300 mg by mouth every 6 (six) hours as needed (Pain)., Disp: , Rfl:    apixaban (ELIQUIS) 5 MG TABS tablet, Take 5 mg by mouth 2 (two) times daily., Disp: , Rfl:    buPROPion (WELLBUTRIN XL) 150 MG 24 hr tablet, Take 150 mg by mouth daily., Disp: , Rfl:    cyclobenzaprine (FLEXERIL) 10 MG tablet, Take 1 tablet (10 mg total) by mouth 3 (three) times daily as needed for muscle spasms., Disp: , Rfl:    ENTRESTO 24-26 MG, Take 1 tablet by mouth 2 (two) times daily., Disp: , Rfl:    esomeprazole (NEXIUM) 40 MG capsule, Take 40 mg by mouth every morning. , Disp: , Rfl:    furosemide (LASIX) 20 MG tablet, Take one tablet  (20mg ) by mouth Monday, Wednesday, Friday., Disp: 36 tablet, Rfl: 0   gabapentin (NEURONTIN) 100 MG capsule, Take 100 mg by mouth 3 (three) times daily., Disp: , Rfl:    guaiFENesin-codeine 100-10 MG/5ML syrup, Take 5 mLs by mouth every 6 (six) hours as needed for cough., Disp: 120 mL, Rfl: 0   levothyroxine (SYNTHROID) 125 MCG tablet, Take 125 mcg by mouth every morning., Disp: , Rfl:    Magnesium 250 MG TABS, Take 1 tablet by mouth daily., Disp: , Rfl:    metoprolol succinate (TOPROL-XL) 50 MG 24 hr tablet, Take 1.5 tablets (75 mg total) by mouth daily. Take with or immediately following a meal., Disp: 135 tablet, Rfl: 3   oxyCODONE-acetaminophen (PERCOCET/ROXICET) 5-325 MG tablet, Take 1 tablet by mouth every 4 (four) hours as needed for severe pain., Disp: , Rfl:    sertraline (ZOLOFT) 100 MG tablet, Take 200 mg by mouth daily., Disp: , Rfl:    spironolactone (ALDACTONE) 25 MG tablet, Take 1 tablet (25 mg total) by mouth daily., Disp: 90 tablet, Rfl: 3   XANAX 0.5  MG tablet, Take 1 (5mg ) 1 hour before procedure then Take the other 1 (5mg ) upon arriving at the office., Disp: 2 tablet, Rfl: 0  Allergies  Allergen Reactions   Sulfa Antibiotics Hives   Sulfamethoxazole-Trimethoprim Hives   Jardiance [Empagliflozin] Rash    Yeast rash     Varicose veins of leg with swelling, bilateral     PLAN: The patient's left lower extremity was sterilely prepped and draped. The ultrasound machine was used to visualize the saphenous vein throughout its course. A segment in the in the mid to upper calf was selected for access. The saphenous vein was accessed with minimal difficulty using ultrasound guidance with a micropuncture needle.  Micropuncture wire and sheath were then placed.  A 0.035 wire was then placed, but would only go to the mid to upper thigh at the junction to a superficial vein to the more deeply laying great saphenous vein. The 65 cm sheath was then placed over the wire and the wire and  dilator were removed. The laser fiber was then placed through the sheath to the mid to upper thigh and its tip was placed approximately 8-10 centimeters below the saphenofemoral junction. Tumescent anesthesia was then created with a dilute lidocaine solution. Laser energy was then delivered with constant withdrawal of the sheath and laser fiber. Approximately 923 joules of energy were delivered over a length of 25 centimeters using a 1470 Hz VenaCure machine at 7 W. Sterile dressings were placed. The patient tolerated the procedure well without obvious complications.   Follow-up in 1 week with post-laser duplex.

## 2023-04-11 ENCOUNTER — Telehealth: Payer: Self-pay | Admitting: Internal Medicine

## 2023-04-11 NOTE — Telephone Encounter (Signed)
 Pt confirmed appt on 04/12/23

## 2023-04-12 ENCOUNTER — Ambulatory Visit: Payer: Medicare HMO | Admitting: Internal Medicine

## 2023-04-12 ENCOUNTER — Other Ambulatory Visit
Admission: RE | Admit: 2023-04-12 | Discharge: 2023-04-12 | Disposition: A | Discharge status: Home / Self Care | Source: Ambulatory Visit | Attending: Internal Medicine | Admitting: Internal Medicine

## 2023-04-12 ENCOUNTER — Encounter: Payer: Self-pay | Admitting: Internal Medicine

## 2023-04-12 ENCOUNTER — Other Ambulatory Visit (HOSPITAL_COMMUNITY): Payer: Self-pay

## 2023-04-12 ENCOUNTER — Other Ambulatory Visit (INDEPENDENT_AMBULATORY_CARE_PROVIDER_SITE_OTHER): Payer: Medicare HMO

## 2023-04-12 VITALS — BP 107/70 | HR 80 | Wt 251.8 lb

## 2023-04-12 DIAGNOSIS — Z79899 Other long term (current) drug therapy: Secondary | ICD-10-CM | POA: Insufficient documentation

## 2023-04-12 DIAGNOSIS — Z6839 Body mass index (BMI) 39.0-39.9, adult: Secondary | ICD-10-CM | POA: Diagnosis not present

## 2023-04-12 DIAGNOSIS — I83893 Varicose veins of bilateral lower extremities with other complications: Secondary | ICD-10-CM

## 2023-04-12 DIAGNOSIS — G4733 Obstructive sleep apnea (adult) (pediatric): Secondary | ICD-10-CM | POA: Insufficient documentation

## 2023-04-12 DIAGNOSIS — I428 Other cardiomyopathies: Secondary | ICD-10-CM | POA: Insufficient documentation

## 2023-04-12 DIAGNOSIS — Z7901 Long term (current) use of anticoagulants: Secondary | ICD-10-CM | POA: Insufficient documentation

## 2023-04-12 DIAGNOSIS — Z86718 Personal history of other venous thrombosis and embolism: Secondary | ICD-10-CM

## 2023-04-12 DIAGNOSIS — I5022 Chronic systolic (congestive) heart failure: Secondary | ICD-10-CM | POA: Diagnosis not present

## 2023-04-12 DIAGNOSIS — Z86711 Personal history of pulmonary embolism: Secondary | ICD-10-CM | POA: Insufficient documentation

## 2023-04-12 DIAGNOSIS — I110 Hypertensive heart disease with heart failure: Secondary | ICD-10-CM | POA: Diagnosis not present

## 2023-04-12 DIAGNOSIS — Z95810 Presence of automatic (implantable) cardiac defibrillator: Secondary | ICD-10-CM | POA: Diagnosis not present

## 2023-04-12 LAB — BASIC METABOLIC PANEL
Anion gap: 5 (ref 5–15)
BUN: 18 mg/dL (ref 6–20)
CO2: 20 mmol/L — ABNORMAL LOW (ref 22–32)
Calcium: 9.3 mg/dL (ref 8.9–10.3)
Chloride: 110 mmol/L (ref 98–111)
Creatinine, Ser: 0.92 mg/dL (ref 0.44–1.00)
GFR, Estimated: >60 mL/min (ref >60)
Glucose, Bld: 120 mg/dL — ABNORMAL HIGH (ref 70–99)
Potassium: 4 mmol/L (ref 3.5–5.1)
Sodium: 135 mmol/L (ref 135–145)

## 2023-04-12 LAB — BRAIN NATRIURETIC PEPTIDE: B Natriuretic Peptide: 36.1 pg/mL (ref 0.0–100.0)

## 2023-04-12 MED ORDER — MOUNJARO 2.5 MG/0.5ML ~~LOC~~ SOAJ: 2.5000 mg | SUBCUTANEOUS | 0 refills | Status: DC

## 2023-04-12 NOTE — Patient Instructions (Signed)
 Labs done today. We will contact you only if your labs are abnormal.  No medication changes were made. Please continue all current medications as prescribed.  Your physician recommends that you schedule a follow-up appointment in: 3 months with Dr. Gasper Lloyd   If you have any questions or concerns before your next appointment please send [place] a message through [facility] or call our office at [phone].    TO LEAVE A MESSAGE FOR THE NURSE SELECT OPTION 2, PLEASE LEAVE A MESSAGE INCLUDING: YOUR NAME DATE OF BIRTH CALL BACK NUMBER REASON FOR CALL**this is important as we prioritize the call backs  YOU WILL RECEIVE A CALL BACK THE SAME DAY AS LONG AS YOU CALL BEFORE 4:00 PM   Do the following things EVERYDAY: Weigh yourself in the morning before breakfast. Write it down and keep it in a log. Take your medicines as prescribed Eat low salt foods--Limit salt (sodium) to 2000 mg per day.  Stay as active as you can everyday Limit all fluids for the day to less than 2 liters   At the [facility], you and your health needs are our priority. As part of our continuing mission to provide you with exceptional heart care, we have created designated Provider Care Teams. These Care Teams include your primary Cardiologist (physician) and Advanced Practice Providers (APPs- Physician Assistants and Nurse Practitioners) who all work together to provide you with the care you need, when you need it.   You may see any of the following providers on your designated Care Team at your next follow up: Dr Arvilla Meres Dr Marca Ancona Dr. Marcos Eke, NP Robbie Lis, [place] [facility] [place], [place] Brynda Peon, NP Karle Plumber, PharmD   Please be sure to bring in all your medications bottles to every appointment.    Thank you for choosing [facility]

## 2023-04-12 NOTE — Progress Notes (Signed)
 [facility]  NOTE   Primary Care: Jerl Mina, MD Primary Cardiologist: Debbe Odea, MD  HF provider: Dorthula Nettles, MD   Chief complaint: Heart failure  HPI:  Lisa Fuentes is a 63 y.o. female with a hx of systolic HF due to NICM, VT s/p MDT ICD morbid obesity, hypertension, PE s/p endovascular thrombolysis 9/24 on Eliquis, OSA and RUE DVT (06/23). Diagnosed with systolic HF in 2000. Cath 2003 no CAD. Had repeat cath 2022 with normal cors.  Had RUE DVT 6/23 after trauma. Upper extremity u/s with multiple RUE DVTs. CTA chest LLL segmental and subsegmental pulmonary emboli without evidence of RV strain. Started on Eliquis. She completed several months of Eliquis, and it was then discontinued per hematology.   Admitted [facility] 10/01/2022. CTA chest with moderate volume of acute PE with RV strain. She was started on heparin. She underwent pulmonary thrombectomy. D/c'd on Eliquis. Nuclear stress test on 10/03/2022,  LVEF 44% with moderate fixed basal septal defect without ischemia. Echo 10/03/22 EF 30-35% mild TR. Was in the ED 10/10/22 due to mid-sternal chest pain and shortness of breath. Labs normal.   Echo 02/18/14: EF 40-45% with Grade I DD Echo 02/22/17: EF 45-50% with Grade I DD Echo 03/24/19: EF 45-50% with mild LVH, Grade I DD, borderline aortic root dilatation 37 mm Echo 03/21/20: EF 40-45% with Grade I DD, borderline aortic root dilatation 37 mm Echo 10/03/22: EF 30-35% with moderate LVH, Grade I DD, mild LAE  LHC 03/28/20:  1.  Normal coronary arteries. 2.  Left ventricular angiography was not performed.  EF was mildly reduced by echo. 3.  Mildly elevated left ventricular end-diastolic pressure 20 mmHg.  She was scheduled for PYP scan but it looks like she had a nuclear stress test instead on 01/09/23 EF 40%  She presents today for follow up. Doing ok. 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 Had episode of CP yesterday. Resolved spontaneously. No edema,  orthopnea or PND. Compliant with most meds but misses night meds at times. Saw EP last week and Optivol was way up. Increased lasi from 20mg  MWF to 20mg  daily   ROS: All systems negative except as listed in HPI, PMH and Problem List.  SH:  Social History   Socioeconomic History   Marital status: Married    Spouse name: Jimmy   Number of children: 2   Years of education: Not on file   Highest education level: Not on file  Occupational History   Not on file  Tobacco Use   Smoking status: Never   Smokeless tobacco: Never  Vaping Use   Vaping status: Never Used  Substance and Sexual Activity   Alcohol use: Yes    Alcohol/week: 0.0 standard drinks of alcohol    Comment: occassionally   Drug use: No   Sexual activity: Not Currently    Birth control/protection: Surgical  Other Topics Concern   Not on file  Social History Narrative   Not on file   Social Drivers of Health   Financial Resource Strain: Low Risk  (03/11/2023)   Received from [facility]   Overall Financial Resource Strain (CARDIA)    Difficulty of Paying Living Expenses: Not hard at all  Food Insecurity: No Food Insecurity (03/11/2023)   Received from [facility]   Hunger Vital Sign    Worried About Running Out of Food in the Last Year: Never true    Ran Out of Food in  the Last Year: Never true  Transportation Needs: No Transportation Needs (03/11/2023)   Received from [facility]    In the past 12 months, has lack of transportation kept you from medical appointments or from getting medications?: No    Lack of Transportation (Non-Medical): No  Physical Activity: Not on file  Stress: Not on file  Social Connections: Not on file  Intimate Partner Violence: Not At Risk (11/02/2022)   Humiliation, Afraid, Rape, and Kick questionnaire    Fear of Current or Ex-Partner: No    Emotionally Abused: No    Physically Abused: No    Sexually  Abused: No    FH:  Family History  Problem Relation Age of Onset   Hypertension Mother    Hyperlipidemia Mother    Stroke Mother    Colon cancer Mother    Breast cancer Sister 77   Ovarian cancer Sister    Throat cancer Brother     Past Medical History:  Diagnosis Date   AICD (automatic cardioverter/defibrillator) present 2008   a.) Medtronic device placed 2008. b.) replaced 06/21/2013 (Medtronic Evera XT single-chamber ICD; model number [id number]).   Anemia    Anxiety    Aortic dilatation (HCC) 03/24/2019   a.) TTE 03/24/2019: Ao root measured 37 mm. b.) TTE 03/21/2020: Ao root measured 37 mm.   Arthritis    Depression    Difficult intubation    DOE (dyspnea on exertion)    Endometriosis of vagina 09/2013   Intra-operative findings of endometriosis implants on cervical stump   Essential hypertension    GERD (gastroesophageal reflux disease)    Headache    HFrEF (heart failure with reduced ejection fraction) (HCC)    a.) TTE 02/18/2014: EF 40-45%; tiv AR, mild MR/TR/PR; G1DD. b.) TTE 02/22/2017: EF 45-50%; mild LVH; triv PR; G1DD. c.) TTE 03/24/2019: EF 45-50%; glob HK, mild LVH; triv MR/TR/PR. d.) TTE 03/21/2020: EF 40-45%; glob HK.   History of 2019 novel coronavirus disease (COVID-19) 02/02/2020   History of 2019 novel coronavirus disease (COVID-19) 02/02/2020   Hypokalemia    Hypothyroidism    a.) s/p radioactive iodine Tx; on levothyroxine   Mixed hyperlipidemia    Nonischemic cardiomyopathy (HCC)    a.) TTE 02/18/2014: EF 40-45%. b.) TTE 02/22/2017: EF 45-50%. c.) TTE 03/24/2019: EF 45-50%. d.) TTE 03/21/2020: EF 40-45%   OSA on CPAP    Panic attacks    PSVT (paroxysmal supraventricular tachycardia) (HCC) 03/30/2020   a.) Holter 03/30/2020 --> 2 runs; fastest lasting 4 beats (184 bpm); longest lasting 4 beats (113 bpm)   Pulmonary embolism (HCC)    Tachycardia     Current Outpatient Medications  Medication Sig Dispense Refill   Acetaminophen (TYLENOL  ARTHRITIS PAIN PO) Take 1,300 mg by mouth every 6 (six) hours as needed (Pain).     apixaban (ELIQUIS) 5 MG TABS tablet Take 5 mg by mouth 2 (two) times daily.     buPROPion (WELLBUTRIN XL) 150 MG 24 hr tablet Take 150 mg by mouth daily.     cyclobenzaprine (FLEXERIL) 10 MG tablet Take 1 tablet (10 mg total) by mouth 3 (three) times daily as needed for muscle spasms.     ENTRESTO 24-26 MG Take 1 tablet by mouth 2 (two) times daily.     esomeprazole (NEXIUM) 40 MG capsule Take 40 mg by mouth every morning.      furosemide (LASIX) 20 MG tablet Take one tablet (20mg ) by mouth Monday, Wednesday,  Friday. 36 tablet 0   gabapentin (NEURONTIN) 100 MG capsule Take 100 mg by mouth 3 (three) times daily.     guaiFENesin-codeine 100-10 MG/5ML syrup Take 5 mLs by mouth every 6 (six) hours as needed for cough. 120 mL 0   levothyroxine (SYNTHROID) 125 MCG tablet Take 125 mcg by mouth every morning.     Magnesium 250 MG TABS Take 1 tablet by mouth daily.     metoprolol succinate (TOPROL-XL) 50 MG 24 hr tablet Take 1.5 tablets (75 mg total) by mouth daily. Take with or immediately following a meal. 135 tablet 3   oxyCODONE-acetaminophen (PERCOCET/ROXICET) 5-325 MG tablet Take 1 tablet by mouth every 4 (four) hours as needed for severe pain.     sertraline (ZOLOFT) 100 MG tablet Take 200 mg by mouth daily.     spironolactone (ALDACTONE) 25 MG tablet Take 1 tablet (25 mg total) by mouth daily. 90 tablet 3   No current facility-administered medications for this visit.   Vitals:   04/12/23 1350  BP: 107/70  Pulse: 80  SpO2: 98%  Weight: 251 lb 12.8 oz (114.2 kg)    Wt Readings from Last 3 Encounters:  04/12/23 251 lb 12.8 oz (114.2 kg)  04/05/23 251 lb (113.9 kg)  03/26/23 248 lb (112.5 kg)   Lab Results  Component Value Date   CREATININE 1.06 (H) 02/22/2023   CREATININE 0.89 12/14/2022   CREATININE 0.95 12/07/2022   PHYSICAL EXAM: General:  Well appearing. No resp difficulty HEENT: normal Neck:  supple. no JVD. Carotids 2+ bilat; no bruits. No lymphadenopathy or thryomegaly appreciated. Cor: PMI nondisplaced. Regular rate & rhythm. No rubs, gallops or murmurs. Lungs: clear Abdomen: obese soft, nontender, nondistended. No hepatosplenomegaly. No bruits or masses. Good bowel sounds. Extremities: no cyanosis, clubbing, rash, edema Neuro: alert & orientedx3, cranial nerves grossly intact. moves all 4 extremities w/o difficulty. Affect pleasant  ICD interrogation: No VT/AF. Fluid was up now dry. Activity level 2.6hr/day Personally reviewed   ASSESSMENT & PLAN:  1. NICM with reduced ejection fraction- - cath 2003 and 2022 no CAD - nuclear stress test on 10/03/2022 EF 44% with moderate fixed basal septal defect without evidence of ischemia.  - Stable NYHA II-III - Volume sttus looks good on exam - ICD interrogated persoanlly as above. Volume ws up but now down. Needs to continue daily lasix  - Echo 02/18/14: EF 40-45% with Grade I DD - Echo 02/22/17: EF 45-50% with Grade I DD - Echo 03/24/19: EF 45-50% with mild LVH, Grade I DD, borderline aortic root dilatation 37 mm  -Echo 03/21/20: EF 40-45% with Grade I DD, borderline aortic root dilatation 37 mm - Echo 10/03/22: EF 30-35% with moderate LVH, Grade I DD, mild LAE - 12/13/22: Echo LVEF 25-30%, normal RV function.  - 12/24 Myoview  EF 40% - continue entresto 24/26mg  BID - continues to take lasix 20mg  every other day; euvolemic on exam today.  - continue spironolactone 25mg  daily - continue metoprolol succinate 75 mg daily; recently increased by EP  - will continue to hold SGLT2i; she reports having a very severe yeast infections - She has had carpal tunnel in both hands now s/p surgical repair; although, her TTE does not appear like a typical case of amyloid, will obtain PYP. She is concerned about having ATTR amyloid after reading about the disease. She was scheduled for PYP scan but it looks like she had a nuclear stress test instead on  01/09/23 EF 40%. I have re-ordered PYP -  Labs today   2. RV Failure- - TTE from 10/03/22 with at least moderately reduced RV function 2/2 acute PE; suspect this has improved after thrombectomy.  - TTE 12/13/22 w/ LVEF 25%-30%, with low normal RV function now. Euvolemic on exam with no clinical signs of RV failure - No change  3. H/o VT- - s/p MDT ICD - ICD interrogated personally. No recent VT/VF  4. Recurrent DVT/PE- - s/p thrombectomy - 12/13/22: Echo LVEF 25-30%, normal RV function.  - continue lifelong Eliquis -> after 1 year can decrease to 2.5 bid per [employer]-EXT data - saw cardiology (Agbor-Etang) 10/24 - No evidence of RV strain on echo   5. Morbid obesity- - Body mass index is Body mass index is 39.44 kg/m. - Off wegovy due lack of insurance denial  - Encouraged to get [facility]. Will have HF Pharmacist see her today  6: HTN- - Blood pressure well controlled. Continue current regimen.  7. OSA - Compliant with CPAP   Arvilla Meres, MD  2:13 PM

## 2023-04-15 ENCOUNTER — Encounter: Payer: Self-pay | Admitting: Cardiology

## 2023-04-15 ENCOUNTER — Telehealth: Payer: Self-pay | Admitting: Cardiology

## 2023-04-15 ENCOUNTER — Telehealth (INDEPENDENT_AMBULATORY_CARE_PROVIDER_SITE_OTHER): Payer: Self-pay

## 2023-04-15 NOTE — Telephone Encounter (Signed)
  Pt c/o of Chest Pain: STAT if active CP, including tightness, pressure, jaw pain, radiating pain to shoulder/upper arm/back, CP unrelieved by Nitro. Symptoms reported of SOB, nausea, vomiting, sweating.  1. Are you having CP right now? No     2. Are you experiencing any other symptoms (ex. SOB, nausea, vomiting, sweating)? No    3. Is your CP continuous or coming and going? Coming and going    4. Have you taken Nitroglycerin? Took 4 baby aspirin    5. How long have you been experiencing CP?  Only Saturday    6. If NO CP at time of call then end call with telling Pt to call back or call 911 if Chest pain returns prior to return call from triage team.   Pt asked if someone from device can look over her device and see if anything was wrong this weekend.

## 2023-04-15 NOTE — Telephone Encounter (Signed)
 Called to discuss with patient, I suspect this is related to nerve irritation post laser and should resolve in time. Continue with conservative therapy

## 2023-04-15 NOTE — Telephone Encounter (Signed)
 Spoke with pt and scheduled an appointment for further evaluations for tomorrow 04/16/23 with Dr. Azucena Cecil. Pt also made aware of ED precaution should any new symptoms develop or worsen. Pt verbalized understanding.

## 2023-04-15 NOTE — Telephone Encounter (Signed)
 Called and spoke w/ pt. She said she had excruciating CP mid sternal accompanied by pressure and SHoB x 2 episodes on Saturday and intermittent since then. Not at home to send a remote transmission. I explained that we more than likely wont be able to see anything on her transmission that would explain the CP and if it continues it is our advisement that she go to the ER. She verbalized understanding. If she sends a transmission she said she will call or send a mychart message at that time. She said Dr. Jimmey Ralph increased her Metoprolol recently and she didn't know if that is what was causing it so on Saturday she decreased metop dose back to what it was previously. I provided education about this and that the increase in metoprolol is highly unlikely to be even a secondary cause to this. Pt verbalized she would resume the prescribed dose. She also said that if she has CP again will go to ED.

## 2023-04-15 NOTE — Telephone Encounter (Signed)
 Error

## 2023-04-15 NOTE — Telephone Encounter (Signed)
 Patient left a message stating that she is experiencing numbness in the left leg down to the ankle. Patient  had laser ablation on 04/05/23. Please Advise

## 2023-04-16 ENCOUNTER — Encounter: Payer: Self-pay | Admitting: Cardiology

## 2023-04-16 ENCOUNTER — Encounter: Payer: Self-pay | Admitting: Internal Medicine

## 2023-04-16 ENCOUNTER — Ambulatory Visit: Attending: Cardiology | Admitting: Cardiology

## 2023-04-16 VITALS — BP 116/72 | HR 70 | Ht 67.0 in | Wt 247.8 lb

## 2023-04-16 DIAGNOSIS — R072 Precordial pain: Secondary | ICD-10-CM

## 2023-04-16 DIAGNOSIS — I428 Other cardiomyopathies: Secondary | ICD-10-CM | POA: Diagnosis not present

## 2023-04-16 DIAGNOSIS — Z95810 Presence of automatic (implantable) cardiac defibrillator: Secondary | ICD-10-CM

## 2023-04-16 MED ORDER — METOPROLOL SUCCINATE ER 50 MG PO TB24: 50.0000 mg | ORAL_TABLET | Freq: Every day | ORAL | 3 refills | Status: DC

## 2023-04-16 NOTE — Patient Instructions (Signed)
 Medication Instructions:  Decrease Metoprolol Succinate back to 50 mg daily   *If you need a refill on your cardiac medications before your next appointment, please call your pharmacy*  Follow-Up: At [facility], you and your health needs are our priority.  As part of our continuing mission to provide you with exceptional heart care, we have created designated Provider Care Teams.  These Care Teams include your primary Cardiologist (physician) and Advanced Practice Providers (APPs -  Physician Assistants and Nurse Practitioners) who all work together to provide you with the care you need, when you need it.  We recommend signing up for the patient portal called "MyChart".  Sign up information is provided on this After Visit Summary.  MyChart is used to connect with patients for Virtual Visits (Telemedicine).  Patients are able to view lab/test results, encounter notes, upcoming appointments, etc.  Non-urgent messages can be sent to your provider as well.   To learn more about what you can do with MyChart, go to [email or web address].    Your next appointment:   6 month(s)  Provider:   You may see Debbe Odea, MD or one of the following Advanced Practice Providers on your designated Care Team:   Nicolasa Ducking, NP Eula Listen, PA-C Cadence Fransico Michael, PA-C Charlsie Quest, NP Carlos Levering, NP

## 2023-04-16 NOTE — Progress Notes (Signed)
 Cardiology Office Note:    Date:  04/16/2023   ID:  Lisa Fuentes, DOB [birth year], MRN [id number]  PCP:  Jerl Mina, MD   [facility] Providers Cardiologist:  Debbe Odea, MD Electrophysiologist:  Sherryl Manges, MD     Referring MD: Jerl Mina, MD   Chief Complaint  Patient presents with   Chest Pain    Patient states that she experienced bad chest pain on Saturday night. The patient states that the pain/pressure was centered in her chest and it did not radiate. Patient states that she felt slight pain yesterday. Meds reviewed.     History of Present Illness:    Lisa Fuentes is a 63 y.o. female with a hx of NICM s/p ICD 2008, GEN change 2015, hypertension, hyperlipidemia, PE s/p endovascular thrombolysis 9/24 on Eliquis, obesity, OSA presents for follow-up.  She has chest pains 3 days ago while doing dishes.  Describes pain as sharp lasting about 10 minutes.  She called her daughter who gave her some water and vinegar.  Her Toprol-XL was recently increased to 75 mg daily.  Over the past 2 to 3 days, she has been taking 50 mg daily with no further episodes of chest discomfort.  Taking Lasix daily causes her to cramp up.  Currently takes Lasix every other day with improvement in cramps.   Prior notes Echo 9/24 EF 30 to 35% Lexiscan Myoview 9/24 no ischemia. Left heart cath 03/2020 no CAD Daughter also has heart failure. Not on Jardiance due to yeast infections.  Past Medical History:  Diagnosis Date   AICD (automatic cardioverter/defibrillator) present 2008   a.) Medtronic device placed 2008. b.) replaced 06/21/2013 (Medtronic Evera XT single-chamber ICD; model number [id number]).   Anemia    Anxiety    Aortic dilatation (HCC) 03/24/2019   a.) TTE 03/24/2019: Ao root measured 37 mm. b.) TTE 03/21/2020: Ao root measured 37 mm.   Arthritis    Depression    Difficult intubation    DOE (dyspnea on exertion)    Endometriosis of vagina 09/2013    Intra-operative findings of endometriosis implants on cervical stump   Essential hypertension    GERD (gastroesophageal reflux disease)    Headache    HFrEF (heart failure with reduced ejection fraction) (HCC)    a.) TTE 02/18/2014: EF 40-45%; tiv AR, mild MR/TR/PR; G1DD. b.) TTE 02/22/2017: EF 45-50%; mild LVH; triv PR; G1DD. c.) TTE 03/24/2019: EF 45-50%; glob HK, mild LVH; triv MR/TR/PR. d.) TTE 03/21/2020: EF 40-45%; glob HK.   History of 2019 novel coronavirus disease (COVID-19) 02/02/2020   History of 2019 novel coronavirus disease (COVID-19) 02/02/2020   Hypokalemia    Hypothyroidism    a.) s/p radioactive iodine Tx; on levothyroxine   Mixed hyperlipidemia    Nonischemic cardiomyopathy (HCC)    a.) TTE 02/18/2014: EF 40-45%. b.) TTE 02/22/2017: EF 45-50%. c.) TTE 03/24/2019: EF 45-50%. d.) TTE 03/21/2020: EF 40-45%   OSA on CPAP    Panic attacks    PSVT (paroxysmal supraventricular tachycardia) (HCC) 03/30/2020   a.) Holter 03/30/2020 --> 2 runs; fastest lasting 4 beats (184 bpm); longest lasting 4 beats (113 bpm)   Pulmonary embolism (HCC)    Tachycardia     Past Surgical History:  Procedure Laterality Date   ANTERIOR AND POSTERIOR REPAIR N/A 11/25/2017   Procedure: ANTERIOR (CYSTOCELE) AND POSTERIOR REPAIR (RECTOCELE);  Surgeon: Hildred Laser, MD;  Location: [facility] ORS;  Service: Gynecology;  Laterality: N/A;   BREAST CYST ASPIRATION Left 03/22/2014  neg/ done by Dr Evette Cristal   CARDIAC CATHETERIZATION  2003   [facility]: No significant coronary artery disease with reduced ejection fraction.   CARDIAC DEFIBRILLATOR PLACEMENT  04/2006   replaced 05/2013   CARDIAC DEFIBRILLATOR PLACEMENT  06/21/2013   Procedure:  CARDIAC DEFIBRILLATOR PLACEMENT (Medtronic single-chamber ICD, model number Lianne Moris, model number [id number]): Location: [facility]; Surgeon: Gerre Pebbles, MD   CARPAL TUNNEL RELEASE Right    COLONOSCOPY  2015   COLONOSCOPY N/A 10/13/2020   Procedure: COLONOSCOPY;  Surgeon:  Jaynie Collins, DO;  Location[facility];  Service: Gastroenterology;  Laterality: N/A;   COLONOSCOPY WITH PROPOFOL N/A 10/04/2015   Procedure: COLONOSCOPY WITH PROPOFOL;  Surgeon: Midge Minium, MD;  Location: [facility] ENDOSCOPY;  Service: Endoscopy;  Laterality: N/A;   CORONARY ANGIOPLASTY     CYSTOSCOPY  11/15/2014   Procedure: CYSTOSCOPY;  Surgeon: Hildred Laser, MD;  Location: [facility] ORS;  Service: Gynecology;;   DIAGNOSTIC LAPAROSCOPY     ETHMOIDECTOMY Right 07/19/2021   Procedure: TOTAL ETHMOIDECTOMY WITH FRONTAL SINUS EXPLORATION;  Surgeon: Vernie Murders, MD;  Location: [facility] ORS;  Service: ENT;  Laterality: Right;   FOOT SURGERY Bilateral    heer spur and bunion   IMAGE GUIDED SINUS SURGERY N/A 07/19/2021   Procedure: IMAGE GUIDED SINUS SURGERY;  Surgeon: Vernie Murders, MD;  Location: [facility] ORS;  Service: ENT;  Laterality: N/A;   KNEE ARTHROSCOPY Left 03/02/2015   Procedure: ARTHROSCOPY  LEFT KNEE, PARTIAL LATERAL  MENISECTOMY, SYNOVECTOMY, MEDIAL & LATERAL CHONDROPLASTY;  Surgeon: Juanell Fairly, MD;  Location: [facility] ORS;  Service: Orthopedics;  Laterality: Left;   LAPAROSCOPIC SALPINGO OOPHERECTOMY Left 11/15/2014   Procedure: LAPAROSCOPIC OOPHORECTOMY;  Surgeon: Hildred Laser, MD;  Location: [facility] ORS;  Service: Gynecology;  Laterality: Left;   LAPAROSCOPIC SALPINGOOPHERECTOMY Right 09/2013   also with left salpingectomy   LEFT HEART CATH AND CORONARY ANGIOGRAPHY N/A 03/28/2020   Procedure: LEFT HEART CATH AND CORONARY ANGIOGRAPHY;  Surgeon: Iran Ouch, MD;  Location: [facility] INVASIVE CV LAB;  Service: Cardiovascular;  Laterality: N/A;   MAXILLARY ANTROSTOMY Right 07/19/2021   Procedure: MAXILLARY ANTROSTOMY WITH REMOVAL TISSUE;  Surgeon: Vernie Murders, MD;  Location: [facility] ORS;  Service: ENT;  Laterality: Right;   MINOR HEMORRHOIDECTOMY     NASAL TURBINATE REDUCTION Bilateral 07/19/2021   Procedure: INFERIOR TURBINATE REDUCTION;  Surgeon: Vernie Murders, MD;  Location: [facility] ORS;   Service: ENT;  Laterality: Bilateral;   PULMONARY THROMBECTOMY Bilateral 10/03/2022   Procedure: PULMONARY THROMBECTOMY;  Surgeon: Annice Needy, MD;  Location: [facility] INVASIVE CV LAB;  Service: Cardiovascular;  Laterality: Bilateral;   SEPTOPLASTY N/A 07/19/2021   Procedure: SEPTOPLASTY;  Surgeon: Vernie Murders, MD;  Location: [facility] ORS;  Service: ENT;  Laterality: N/A;   TONSILLECTOMY     TOTAL ABDOMINAL HYSTERECTOMY     TRACHELECTOMY N/A 11/15/2014   Procedure: TRACHELECTOMY;  Surgeon: Hildred Laser, MD;  Location: [facility] ORS;  Service: Gynecology;  Laterality: N/A;   TRIGGER FINGER RELEASE Right    TUBAL LIGATION Bilateral     Current Medications: Current Meds  Medication Sig   Acetaminophen (TYLENOL ARTHRITIS PAIN PO) Take 1,300 mg by mouth every 6 (six) hours as needed (Pain).   apixaban (ELIQUIS) 5 MG TABS tablet Take 5 mg by mouth 2 (two) times daily.   buPROPion (WELLBUTRIN XL) 150 MG 24 hr tablet Take 150 mg by mouth daily.   cyclobenzaprine (FLEXERIL) 10 MG tablet Take 1 tablet (10 mg total) by mouth 3 (three) times daily as needed for muscle spasms.  ENTRESTO 24-26 MG Take 1 tablet by mouth 2 (two) times daily.   esomeprazole (NEXIUM) 40 MG capsule Take 40 mg by mouth every morning.    furosemide (LASIX) 20 MG tablet Take one tablet (20mg ) by mouth Monday, Wednesday, Friday.   gabapentin (NEURONTIN) 100 MG capsule Take 100 mg by mouth 3 (three) times daily.   guaiFENesin-codeine 100-10 MG/5ML syrup Take 5 mLs by mouth every 6 (six) hours as needed for cough.   levothyroxine (SYNTHROID) 125 MCG tablet Take 125 mcg by mouth every morning.   Magnesium 250 MG TABS Take 1 tablet by mouth daily.   oxyCODONE-acetaminophen (PERCOCET/ROXICET) 5-325 MG tablet Take 1 tablet by mouth every 4 (four) hours as needed for severe pain.   sertraline (ZOLOFT) 100 MG tablet Take 200 mg by mouth daily.   spironolactone (ALDACTONE) 25 MG tablet Take 1 tablet (25 mg total) by mouth daily.   tirzepatide  [facility]) 2.5 MG/0.5ML Pen Inject 2.5 mg into the skin once a week.   [DISCONTINUED] metoprolol succinate (TOPROL-XL) 50 MG 24 hr tablet Take 1.5 tablets (75 mg total) by mouth daily. Take with or immediately following a meal.     Allergies:   Sulfa antibiotics, Sulfamethoxazole-trimethoprim, and Jardiance [empagliflozin]   Social History   Socioeconomic History   Marital status: Married    Spouse name: Jimmy   Number of children: 2   Years of education: Not on file   Highest education level: Not on file  Occupational History   Not on file  Tobacco Use   Smoking status: Never   Smokeless tobacco: Never  Vaping Use   Vaping status: Never Used  Substance and Sexual Activity   Alcohol use: Yes    Alcohol/week: 0.0 standard drinks of alcohol    Comment: occassionally   Drug use: No   Sexual activity: Not Currently    Birth control/protection: Surgical  Other Topics Concern   Not on file  Social History Narrative   Not on file   Social Drivers of Health   Financial Resource Strain: Low Risk  (03/11/2023)   Received from [facility]   Overall Financial Resource Strain (CARDIA)    Difficulty of Paying Living Expenses: Not hard at all  Food Insecurity: No Food Insecurity (03/11/2023)   Received from [facility]   Hunger Vital Sign    Worried About Running Out of Food in the Last Year: Never true    Ran Out of Food in the Last Year: Never true  Transportation Needs: No Transportation Needs (03/11/2023)   Received from [facility] - Transportation    In the past 12 months, has lack of transportation kept you from medical appointments or from getting medications?: No    Lack of Transportation (Non-Medical): No  Physical Activity: Not on file  Stress: Not on file  Social Connections: Not on file     Family History: The patient's family history includes Breast cancer (age of onset: 38) in her sister; Colon cancer  in her mother; Hyperlipidemia in her mother; Hypertension in her mother; Ovarian cancer in her sister; Stroke in her mother; Throat cancer in her brother.  ROS:   Please see the history of present illness.     All other systems reviewed and are negative.  EKGs/Labs/Other Studies Reviewed:    The following studies were reviewed today:  EKG Interpretation Date/Time:  Tuesday April 16 2023 09:10:28 EDT Ventricular Rate:  70 PR Interval:  146  QRS Duration:  84 QT Interval:  334 QTC Calculation: 360 R Axis:   -9  Text Interpretation: Normal sinus rhythm Left ventricular hypertrophy with repolarization abnormality ( R in aVL ) Confirmed by Debbe Odea ([id number]) on 04/16/2023 9:28:45 AM    Recent Labs: 02/22/2023: ALT 25; Hemoglobin 15.1; Platelets 142 04/12/2023: B Natriuretic Peptide 36.1; BUN 18; Creatinine, Ser 0.92; Potassium 4.0; Sodium 135  Recent Lipid Panel No results found for: "CHOL", "TRIG", "HDL", "CHOLHDL", "VLDL", "LDLCALC", "LDLDIRECT"   Risk Assessment/Calculations:             Physical Exam:    VS:  BP 116/72   Pulse 70   Ht 5\' 7"  (1.702 m)   Wt 247 lb 12.8 oz (112.4 kg)   SpO2 99%   BMI 38.81 kg/m     Wt Readings from Last 3 Encounters:  04/16/23 247 lb 12.8 oz (112.4 kg)  04/12/23 251 lb 12.8 oz (114.2 kg)  04/05/23 251 lb (113.9 kg)     GEN:  Well nourished, well developed in no acute distress HEENT: Normal NECK: No JVD; No carotid bruits CARDIAC: RRR, no murmurs, rubs, gallops RESPIRATORY:  Clear to auscultation without rales, wheezing or rhonchi  ABDOMEN: Soft, non-tender, non-distended MUSCULOSKELETAL:  No edema; No deformity  SKIN: Warm and dry NEUROLOGIC:  Alert and oriented x 3 PSYCHIATRIC:  Normal affect   ASSESSMENT:    1. NICM (nonischemic cardiomyopathy) (HCC)   2. ICD (implantable cardioverter-defibrillator) in place   3. Morbid obesity (HCC)   4. Precordial pain    PLAN:    In order of problems listed above:  Chest  pain, appears noncardiac.  Could be GERD versus ?medication adverse effect.  Symptoms resolved with reducing Toprol-XL.  Prior left heart cath with no CAD, recent Myoview also with no ischemia.  No indication for ischemic workup at this time. Nonischemic cardiomyopathy EF 35% s/p AICD.  Likely Hereditary  with daughter also having heart failure.  Low normal BPs at home.  Euvolemic on exam.  Reduce Toprol-XL to 50 mg daily, continue Entresto 24-26 mg twice daily, Aldactone 25 mg daily, Lasix 20 mg every other day.   S/p AICD, monitoring as per device clinic. Morbid obesity, history of PEs, heart failure.  Continue Mounjaro.  Follow-up in 6 months.     Medication Adjustments/Labs and Tests Ordered: Current medicines are reviewed at length with the patient today.  Concerns regarding medicines are outlined above.  Orders Placed This Encounter  Procedures   EKG 12-Lead   Meds ordered this encounter  Medications   metoprolol succinate (TOPROL-XL) 50 MG 24 hr tablet    Sig: Take 1 tablet (50 mg total) by mouth daily. Take with or immediately following a meal.    Dispense:  90 tablet    Refill:  3    Patient Instructions  Medication Instructions:  Decrease Metoprolol Succinate back to 50 mg daily   *If you need a refill on your cardiac medications before your next appointment, please call your pharmacy*  Follow-Up: At [facility], you and your health needs are our priority.  As part of our continuing mission to provide you with exceptional heart care, we have created designated Provider Care Teams.  These Care Teams include your primary Cardiologist (physician) and Advanced Practice Providers (APPs -  Physician Assistants and Nurse Practitioners) who all work together to provide you with the care you need, when you need it.  We recommend signing up for the patient portal called "MyChart".  Sign up information is provided on this After Visit Summary.  MyChart is used to connect with  patients for Virtual Visits (Telemedicine).  Patients are able to view lab/test results, encounter notes, upcoming appointments, etc.  Non-urgent messages can be sent to your provider as well.   To learn more about what you can do with MyChart, go to [email or web address].    Your next appointment:   6 month(s)  Provider:   You may see Debbe Odea, MD or one of the following Advanced Practice Providers on your designated Care Team:   Nicolasa Ducking, NP Eula Listen, PA-C Cadence Fransico Michael, PA-C Charlsie Quest, NP Carlos Levering, NP       Signed, Debbe Odea, MD  04/16/2023 10:15 AM    [facility]

## 2023-04-18 NOTE — Telephone Encounter (Signed)
 Patient called stating that the dental office will not accept the clearance from the NP. Patient stated the dental office will only accept the clearance from an MD. Please advise.

## 2023-04-22 ENCOUNTER — Telehealth: Payer: Self-pay | Admitting: Cardiology

## 2023-04-22 NOTE — Telephone Encounter (Signed)
   Pre-operative Risk Assessment    Patient Name: Lisa Fuentes  DOB: Nov 07, 1960 MRN: [id number]   Date of last office visit: 04/16/23 Date of next office visit: n/a   Request for Surgical Clearance    Procedure:   Right shoulder arthroscopy, rotator cuff repair  Date of Surgery:  Clearance 05/01/23                                Surgeon:  Dr. Peri Maris Group or Practice Name:  [facility] Phone number:  [phone] Fax number:  [phone]   Type of Clearance Requested:   - Medical    Type of Anesthesia:  General    Additional requests/questions:    SignedMaceo Pro Schools   04/22/2023, 10:37 AM

## 2023-04-22 NOTE — Telephone Encounter (Signed)
 Duplicate request

## 2023-04-22 NOTE — Telephone Encounter (Signed)
 Primary Cardiologist: Debbe Odea, MD   Per Dr. Azucena Cecil, primary cardiologist: No additional cardiac testing needed prior to dental extraction. She is cleared to proceed.   I will route this recommendation to the requesting party via Epic fax function and remove from pre-op pool.  Please call with questions.  Levi Aland, NP-C  04/22/2023, 7:28 AM [address] Office [phone] Fax [phone]

## 2023-04-23 DIAGNOSIS — M461 Sacroiliitis, not elsewhere classified: Secondary | ICD-10-CM | POA: Diagnosis not present

## 2023-04-23 NOTE — Telephone Encounter (Signed)
 Our office received a duplicate request from the DDS, who states she needs her clearance request form signed by MD and NOT NP.   I will fax these notes to Dr. Azucena Cecil for MD to sign. Once signed MD can have his nurse/CMA fax notes to DDS.   I will also update Ward Givens, FNP who manages preop.

## 2023-04-24 ENCOUNTER — Telehealth: Payer: Self-pay | Admitting: Cardiology

## 2023-04-24 NOTE — Telephone Encounter (Signed)
 Left detailed message for [facility], last echo was 11/2022 and EF% was 25-30%. Nuclear study 12/2022 showed EF% of 40 %. She is to call back with questions.

## 2023-04-24 NOTE — Telephone Encounter (Signed)
 Caller Ames Coupe) stated she faxed over a new medical clearance form and will need the directions section completed and Dr. Merita Norton signature.

## 2023-04-24 NOTE — Telephone Encounter (Signed)
 Request has been received, Dr. Azucena Cecil signed. I faxed back to Attn: Dr. Lorayne Bender

## 2023-04-24 NOTE — Telephone Encounter (Signed)
 She would like a call back with pt's ejection fraction from her last Echo.

## 2023-04-24 NOTE — Telephone Encounter (Signed)
 Lisa Fuentes is requesting a callback regarding them need the MD to sign the front of the page and fax it back over with directions. She stated it's being filled out incorrectly. She's going to fax over another blank one. Please advise

## 2023-04-26 ENCOUNTER — Telehealth: Payer: Self-pay | Admitting: Cardiovascular Disease

## 2023-04-26 ENCOUNTER — Telehealth: Payer: Self-pay | Admitting: *Deleted

## 2023-04-26 NOTE — Telephone Encounter (Signed)
 Per Saun the sent the clearance sheet and has not got it back yet. Please look into this because the surgery is next wed.

## 2023-04-26 NOTE — Telephone Encounter (Signed)
   Pre-operative Risk Assessment    Patient Name: Lisa Fuentes  DOB: Apr 02, 1960 MRN: [id number]   Date of last office visit: 04/16/23 Date of next office visit: n/a   Request for Surgical Clearance    Procedure:   Right Rotator Cuff Repair  Date of Surgery:  Clearance 05/01/23                                Surgeon:  Franco Collet Surgeon's Group or Practice Name:  [facility] Phone number:  [phone] Fax number:  [phone]   Type of Clearance Requested:   - Pharmacy:  Hold Apixaban (Eliquis) 3 days   Type of Anesthesia:  Not Indicated   Additional requests/questions:    [name]   04/26/2023, 7:53 AM

## 2023-04-26 NOTE — Telephone Encounter (Signed)
 Patient with diagnosis of recurrent PE/DVT on Eliquis for anticoagulation.    Procedure: Right Rotator Cuff Repair  Date of procedure: 05/01/23  CrCl 83 ml/min Platelet count 178  Pt reports clot in her heart is 2003/2004 time frame treated with warfarin. Was off anticoagulation for about 20 years. Then had a provoked DVT/PE post surgery in 2023. She was on anticoagulation for 3 months. 10/02/22 patient presented to ER with PE and DVT. She underwent thrombectomy.   Due to pt hx of recurrent VTE, the need to hold Eliquis for 3 days and the fact that she will be just at the 6 month mark, will defer to Dr. Azucena Cecil

## 2023-04-27 NOTE — Telephone Encounter (Signed)
 I signed it on Friday 3/29 and tori said she faxed it

## 2023-04-30 NOTE — Telephone Encounter (Signed)
   Patient Name: Lisa Fuentes  DOB: 1960/07/11 MRN: [id number]  Primary Cardiologist: Debbe Odea, MD  Chart reviewed as part of pre-operative protocol coverage. Pre-op clearance already addressed by colleagues in earlier phone notes. To summarize recommendations:  - Since we are past the six month mark, I am ok with her stopping the Eliquis for three days and resuming as soon after surgery as you feel safe. jason   Medical clearance was not requested however, patient was recently seen in the clinic and was doing well at that time. If no new CV symptoms would be at an acceptable risk to undergo procedure without any additional CV testing.    Will route this bundled recommendation to requesting provider via Epic fax function and remove from pre-op pool. Please call with questions.  Sharlene Dory, PA-C 04/30/2023, 8:58 AM

## 2023-05-01 DIAGNOSIS — M75111 Incomplete rotator cuff tear or rupture of right shoulder, not specified as traumatic: Secondary | ICD-10-CM | POA: Diagnosis not present

## 2023-05-01 DIAGNOSIS — M7521 Bicipital tendinitis, right shoulder: Secondary | ICD-10-CM | POA: Diagnosis not present

## 2023-05-01 DIAGNOSIS — M7541 Impingement syndrome of right shoulder: Secondary | ICD-10-CM | POA: Diagnosis not present

## 2023-05-01 DIAGNOSIS — L539 Erythematous condition, unspecified: Secondary | ICD-10-CM | POA: Diagnosis not present

## 2023-05-01 DIAGNOSIS — G8918 Other acute postprocedural pain: Secondary | ICD-10-CM | POA: Diagnosis not present

## 2023-05-01 DIAGNOSIS — Z950 Presence of cardiac pacemaker: Secondary | ICD-10-CM | POA: Diagnosis not present

## 2023-05-01 DIAGNOSIS — M7551 Bursitis of right shoulder: Secondary | ICD-10-CM | POA: Diagnosis not present

## 2023-05-01 DIAGNOSIS — Z6839 Body mass index (BMI) 39.0-39.9, adult: Secondary | ICD-10-CM | POA: Diagnosis not present

## 2023-05-01 DIAGNOSIS — Z86711 Personal history of pulmonary embolism: Secondary | ICD-10-CM | POA: Diagnosis not present

## 2023-05-01 DIAGNOSIS — M75121 Complete rotator cuff tear or rupture of right shoulder, not specified as traumatic: Secondary | ICD-10-CM | POA: Diagnosis not present

## 2023-05-01 DIAGNOSIS — M94211 Chondromalacia, right shoulder: Secondary | ICD-10-CM | POA: Diagnosis not present

## 2023-05-03 ENCOUNTER — Ambulatory Visit (INDEPENDENT_AMBULATORY_CARE_PROVIDER_SITE_OTHER): Payer: Medicare HMO | Admitting: Nurse Practitioner

## 2023-05-07 ENCOUNTER — Telehealth: Payer: Self-pay | Admitting: Pharmacist

## 2023-05-07 NOTE — Telephone Encounter (Signed)
 Attempted to call patient to discuss tolerability and progress of GLP-1RA treatment, however there was no response. VM left instructing the patient to call back at her earliest convenience.

## 2023-05-09 DIAGNOSIS — M533 Sacrococcygeal disorders, not elsewhere classified: Secondary | ICD-10-CM | POA: Diagnosis not present

## 2023-05-09 DIAGNOSIS — R202 Paresthesia of skin: Secondary | ICD-10-CM | POA: Diagnosis not present

## 2023-05-09 DIAGNOSIS — Z79899 Other long term (current) drug therapy: Secondary | ICD-10-CM | POA: Diagnosis not present

## 2023-05-09 DIAGNOSIS — M47817 Spondylosis without myelopathy or radiculopathy, lumbosacral region: Secondary | ICD-10-CM | POA: Diagnosis not present

## 2023-05-09 DIAGNOSIS — G894 Chronic pain syndrome: Secondary | ICD-10-CM | POA: Diagnosis not present

## 2023-05-09 DIAGNOSIS — B3731 Acute candidiasis of vulva and vagina: Secondary | ICD-10-CM | POA: Diagnosis not present

## 2023-05-14 ENCOUNTER — Encounter: Payer: Self-pay | Admitting: Internal Medicine

## 2023-05-14 DIAGNOSIS — M25511 Pain in right shoulder: Secondary | ICD-10-CM | POA: Diagnosis not present

## 2023-05-14 DIAGNOSIS — M25611 Stiffness of right shoulder, not elsewhere classified: Secondary | ICD-10-CM | POA: Diagnosis not present

## 2023-05-16 NOTE — Telephone Encounter (Signed)
 Pt states dental office would like a MD to sign the clearance instead of the PA/NP. Please advise

## 2023-05-16 NOTE — Telephone Encounter (Signed)
 Message left for Jaylen to call us  back - Message: NP/PA patient care is overseen by a MD. This request is superfluous. If [facility] has a specific policy that specifies that med clearance can only come from a MD then that policy should accompany the clearance request.

## 2023-05-17 DIAGNOSIS — M25611 Stiffness of right shoulder, not elsewhere classified: Secondary | ICD-10-CM | POA: Diagnosis not present

## 2023-05-17 DIAGNOSIS — M25511 Pain in right shoulder: Secondary | ICD-10-CM | POA: Diagnosis not present

## 2023-05-20 DIAGNOSIS — M25611 Stiffness of right shoulder, not elsewhere classified: Secondary | ICD-10-CM | POA: Diagnosis not present

## 2023-05-20 DIAGNOSIS — M25511 Pain in right shoulder: Secondary | ICD-10-CM | POA: Diagnosis not present

## 2023-05-21 ENCOUNTER — Ambulatory Visit (INDEPENDENT_AMBULATORY_CARE_PROVIDER_SITE_OTHER): Admitting: Nurse Practitioner

## 2023-05-24 ENCOUNTER — Other Ambulatory Visit: Payer: Self-pay

## 2023-05-24 ENCOUNTER — Emergency Department

## 2023-05-24 ENCOUNTER — Emergency Department
Admission: EM | Admit: 2023-05-24 | Discharge: 2023-05-24 | Disposition: A | Discharge status: Home / Self Care | Attending: Emergency Medicine | Admitting: Emergency Medicine

## 2023-05-24 DIAGNOSIS — R0781 Pleurodynia: Secondary | ICD-10-CM | POA: Diagnosis not present

## 2023-05-24 DIAGNOSIS — R109 Unspecified abdominal pain: Secondary | ICD-10-CM | POA: Diagnosis not present

## 2023-05-24 DIAGNOSIS — R1011 Right upper quadrant pain: Secondary | ICD-10-CM | POA: Diagnosis not present

## 2023-05-24 DIAGNOSIS — I2699 Other pulmonary embolism without acute cor pulmonale: Secondary | ICD-10-CM | POA: Diagnosis not present

## 2023-05-24 LAB — URINALYSIS, ROUTINE W REFLEX MICROSCOPIC
Bilirubin Urine: NEGATIVE
Glucose, UA: NEGATIVE mg/dL
Ketones, ur: 5 mg/dL — AB
Nitrite: NEGATIVE
Protein, ur: NEGATIVE mg/dL
Specific Gravity, Urine: 1.021 (ref 1.005–1.030)
pH: 5 (ref 5.0–8.0)

## 2023-05-24 LAB — COMPREHENSIVE METABOLIC PANEL WITH GFR
ALT: 16 U/L (ref 0–44)
AST: 16 U/L (ref 15–41)
Albumin: 4.1 g/dL (ref 3.5–5.0)
Alkaline Phosphatase: 70 U/L (ref 38–126)
Anion gap: 8 (ref 5–15)
BUN: 15 mg/dL (ref 6–20)
CO2: 22 mmol/L (ref 22–32)
Calcium: 10 mg/dL (ref 8.9–10.3)
Chloride: 105 mmol/L (ref 98–111)
Creatinine, Ser: 1.08 mg/dL — ABNORMAL HIGH (ref 0.44–1.00)
GFR, Estimated: 59 mL/min — ABNORMAL LOW (ref >60)
Glucose, Bld: 93 mg/dL (ref 70–99)
Potassium: 3.8 mmol/L (ref 3.5–5.1)
Sodium: 135 mmol/L (ref 135–145)
Total Bilirubin: 0.4 mg/dL (ref 0.0–1.2)
Total Protein: 7.6 g/dL (ref 6.5–8.1)

## 2023-05-24 LAB — CBC
HCT: 42 % (ref 36.0–46.0)
Hemoglobin: 13.9 g/dL (ref 12.0–15.0)
MCH: 31 pg (ref 26.0–34.0)
MCHC: 33.1 g/dL (ref 30.0–36.0)
MCV: 93.5 fL (ref 80.0–100.0)
Platelets: 171 10*3/uL (ref 150–400)
RBC: 4.49 MIL/uL (ref 3.87–5.11)
RDW: 13.9 % (ref 11.5–15.5)
WBC: 5.2 10*3/uL (ref 4.0–10.5)
nRBC: 0 % (ref 0.0–0.2)

## 2023-05-24 LAB — TROPONIN I (HIGH SENSITIVITY)
Troponin I (High Sensitivity): <2 ng/L (ref <18)
Troponin I (High Sensitivity): <2 ng/L (ref <18)

## 2023-05-24 MED ORDER — MORPHINE SULFATE (PF) 4 MG/ML IV SOLN
4.0000 mg | Freq: Once | INTRAVENOUS | Status: AC
  Administered 2023-05-24: 4 mg via INTRAVENOUS
  Filled 2023-05-24: qty 1

## 2023-05-24 MED ORDER — IOHEXOL 350 MG/ML SOLN
100.0000 mL | Freq: Once | INTRAVENOUS | Status: AC | PRN
  Administered 2023-05-24: 100 mL via INTRAVENOUS

## 2023-05-24 MED ORDER — LIDOCAINE 5 % EX PTCH: 1.0000 | MEDICATED_PATCH | CUTANEOUS | 0 refills | Status: AC

## 2023-05-24 NOTE — ED Triage Notes (Signed)
 Pt to ED via POV c/o right flank pain x1 day. Has been getting progressively worse. Does not radiate. Denies abd pain, cp, shob, nausea, vomiting, diarrhea

## 2023-05-24 NOTE — ED Notes (Signed)
 Lab called and they stated they can add Troponin on to labs already sent.

## 2023-05-24 NOTE — ED Notes (Signed)
 Answered call light, pt ask to silence the Philips monitor in her room.

## 2023-05-24 NOTE — ED Provider Notes (Signed)
 [facility] Provider Note    Event Date/Time   First MD Initiated Contact with Patient 05/24/23 [phone]     (approximate)   History   Flank Pain   HPI  Lisa Fuentes is a 63 y.o. female with a history of paroxysmal ventricular tachycardia, ICD, prior pulmonary embolism.  Patient reports compliance with Eliquis  takes it as prescribed.  Had shoulder surgery on April 2.  She was doing well until yesterday for the last 24 hours she has been experiencing a pain in her right lower chest versus right upper abdomen.  It is worse when she takes a deep breath and does not radiate.  She says she had a similar symptom in January where they checked her gallbladder but that looked okay she also had the flu then  No fevers or chills no bodyaches no leg swelling.  Compliant with her Eliquis  and other medications.  She reports it is a sharp severe catching type pain especially when she moves or takes a deep breath along her right upper abdomen points towards her right anterior costal margin   No nausea vomiting or diarrhea  Shoulder has been recovering well without shoulder issue except use of immobilizer when up  Physical Exam   Triage Vital Signs: ED Triage Vitals  Encounter Vitals Group     BP 05/24/23 0335 112/85     Systolic BP Percentile --      Diastolic BP Percentile --      Pulse Rate 05/24/23 0335 98     Resp 05/24/23 0335 18     Temp 05/24/23 0335 98.4 F (36.9 C)     Temp Source 05/24/23 0335 Oral     SpO2 05/24/23 0335 96 %     Weight 05/24/23 0334 239 lb (108.4 kg)     Height 05/24/23 0334 5\' 7"  (1.702 m)     Head Circumference --      Peak Flow --      Pain Score 05/24/23 0334 10     Pain Loc --      Pain Education --      Exclude from Growth Chart --     Most recent vital signs: Vitals:   05/24/23 0655 05/24/23 0700  BP: 107/74 103/79  Pulse: 77 79  Resp: 16 13  Temp:    SpO2: 99% 97%     General: Awake, no distress.  She is very  pleasant.  When she shifts or moves about or takes a deep breath she winces somewhat in pain reporting pain over her right anterior lower rib cage versus right upper quadrant CV:  Good peripheral perfusion.  Normal tones and rate Resp:  Normal effort.  Clear lungs bilaterally in all fields Abd:  No distention.  No clearly reproducible abdominal pain, some discomfort with Abigail Abler sign but pain is actually more prominent when she is asked to take a deep breath produces a pleuritic discomfort over the right chest Other:     ED Results / Procedures / Treatments   Labs (all labs ordered are listed, but only abnormal results are displayed) Labs Reviewed  COMPREHENSIVE METABOLIC PANEL WITH GFR - Abnormal; Notable for the following components:      Result Value   Creatinine, Ser 1.08 (*)    GFR, Estimated 59 (*)    All other components within normal limits  URINALYSIS, ROUTINE W REFLEX MICROSCOPIC - Abnormal; Notable for the following components:   Color, Urine YELLOW (*)  APPearance CLOUDY (*)    Hgb urine dipstick SMALL (*)    Ketones, ur 5 (*)    Leukocytes,Ua MODERATE (*)    Bacteria, UA RARE (*)    All other components within normal limits  URINE CULTURE  CBC  TROPONIN I (HIGH SENSITIVITY)  TROPONIN I (HIGH SENSITIVITY)   Labs interpreted as normal CBC and comprehensive metabolic panel  UA with moderate leukocytes and rare bacteria but also present are squamous epithelial cells.  Will send for culture.  The patient's symptoms do not well align with urinary tract infection no pain no burning with urination, no right flank pain.  Her pain seems primarily pleuritic to right upper quadrant in nature.  Trop normal.  EKG  And interpreted by me at 520 heart rate 80 QRS 80 QTc 400 Normal sinus rhythm, no evidence of acute ischemia.  Very mild nonspecific T wave abnormality   RADIOLOGY   CT imaging of the abdomen pelvis as well as PE study pending at this time.  Dr. Vicenta Graft to  follow-up on results   PROCEDURES:  Critical Care performed: No  Procedures   MEDICATIONS ORDERED IN ED: Medications  morphine  (PF) 4 MG/ML injection 4 mg (4 mg Intravenous Given 05/24/23 0530)  iohexol  (OMNIPAQUE ) 350 MG/ML injection 100 mL (100 mLs Intravenous Contrast Given 05/24/23 0546)  morphine  (PF) 4 MG/ML injection 4 mg (4 mg Intravenous Given 05/24/23 0726)     IMPRESSION / MDM / ASSESSMENT AND PLAN / ED COURSE  I reviewed the triage vital signs and the nursing notes.                              Differential diagnosis includes, but is not limited to, somewhat pleuritic leg pain along the costal margin between the abdomen and right lower thoracic region.  No tachycardia no hypoxia.  She is compliant with Eliquis  lowering the risk for thromboembolism but has a history of PE.  Will obtain PE study.  Her symptoms seem very atypical of ACS.  ECG and troponin are pending, I suspect unlikely to represent cardiac etiology of ECG and troponin are normal  She also reports some right upper quadrant aspect.  Her pain that was most reproduced with pleuritic type pain and deep inspiration and movement causing me to think could be musculoskeletal but certainly we need to rule out PE or acute intra-abdominal cause though no fever no elevated white count no transaminitis seem to argue strongly against acute hepatobiliary cause.  Other considerations such as kidney stone with referred pain etc. are also considered.  Discussed with the patient will provide pain relief, proceed with CT PE and abdomen  Patient's presentation is most consistent with acute complicated illness / injury requiring diagnostic workup.   Ongoing care and disposition including follow-up on reassessment and pending CT imaging assigned to Dr. Vicenta Graft       FINAL CLINICAL IMPRESSION(S) / ED DIAGNOSES   Final diagnoses:  RUQ abdominal pain     Rx / DC Orders   ED Discharge Orders     None        Note:   This document was prepared using Dragon voice recognition software and may include unintentional dictation errors.   Iver Marker, MD 05/24/23 [phone]

## 2023-05-24 NOTE — ED Provider Notes (Signed)
 Procedures     ----------------------------------------- 8:06 AM on 05/24/2023 ----------------------------------------- CT chest abdomen pelvis unremarkable.  No PE or other acute findings.  Vitals are normal.  Pain suspected to be musculoskeletal.  Counseled her on therapy, Lidoderm  patch, continuing her other medications, follow-up with primary care.     Jacquie Maudlin, MD 05/24/23 [phone]

## 2023-05-25 LAB — URINE CULTURE: Culture: <10000 — AB

## 2023-05-27 DIAGNOSIS — M25611 Stiffness of right shoulder, not elsewhere classified: Secondary | ICD-10-CM | POA: Diagnosis not present

## 2023-05-27 DIAGNOSIS — M25511 Pain in right shoulder: Secondary | ICD-10-CM | POA: Diagnosis not present

## 2023-05-27 NOTE — Telephone Encounter (Signed)
 Pt requesting c/b. Please advise

## 2023-05-27 NOTE — Telephone Encounter (Signed)
 Preoperative team, please contact requesting office and patient.  We will need details surrounding upcoming procedure.  At that time we will ask for MD to evaluate and provide recommendations.  Thank you for your help.  Chet Cota. Muhamad Serano NP-C     05/27/2023, 3:22 PM [facility] [address] Office [phone] Fax [phone]

## 2023-05-27 NOTE — Telephone Encounter (Signed)
 Returned patient phone call and she states that the requesting provider's only wants the doctor to sign off on her clearance and not an APP. Please advise.

## 2023-05-29 ENCOUNTER — Telehealth (INDEPENDENT_AMBULATORY_CARE_PROVIDER_SITE_OTHER): Payer: Self-pay | Admitting: Nurse Practitioner

## 2023-05-29 NOTE — Telephone Encounter (Signed)
 Pt LVM stating she wants to see JD stating her ankle is numb and swelling after having laser procedure done on 04/05/23. Pt missed last two 4 week post laser fu. Please advise. Pt call back number is [phone].

## 2023-05-30 ENCOUNTER — Telehealth: Payer: Self-pay | Admitting: Pharmacist

## 2023-05-30 MED ORDER — MOUNJARO 5 MG/0.5ML ~~LOC~~ SOAJ: 5.0000 mg | SUBCUTANEOUS | 0 refills | Status: DC

## 2023-05-30 NOTE — Telephone Encounter (Signed)
 Patient tolerating Mounjaro well. Will increase to 5 mg weekly.

## 2023-05-31 ENCOUNTER — Ambulatory Visit (INDEPENDENT_AMBULATORY_CARE_PROVIDER_SITE_OTHER): Admitting: Nurse Practitioner

## 2023-05-31 ENCOUNTER — Encounter (INDEPENDENT_AMBULATORY_CARE_PROVIDER_SITE_OTHER): Payer: Self-pay | Admitting: Nurse Practitioner

## 2023-05-31 VITALS — BP 102/71 | HR 78 | Resp 18 | Wt 240.0 lb

## 2023-05-31 DIAGNOSIS — I10 Essential (primary) hypertension: Secondary | ICD-10-CM | POA: Diagnosis not present

## 2023-05-31 DIAGNOSIS — E7800 Pure hypercholesterolemia, unspecified: Secondary | ICD-10-CM | POA: Diagnosis not present

## 2023-05-31 DIAGNOSIS — I8312 Varicose veins of left lower extremity with inflammation: Secondary | ICD-10-CM

## 2023-06-03 ENCOUNTER — Encounter (INDEPENDENT_AMBULATORY_CARE_PROVIDER_SITE_OTHER): Payer: Self-pay | Admitting: Nurse Practitioner

## 2023-06-03 NOTE — Progress Notes (Signed)
 Subjective:    Patient ID: Lisa Fuentes, female    DOB: Feb 27, 1960, 63 y.o.   MRN: [id number] Chief Complaint  Patient presents with   Follow-up    4 week post laser    The patient returns to the office for followup status post laser ablation of the left great saphenous vein on 04/05/2023.  The patient note significant improvement in the lower extremity pain but not resolution of the symptoms. The patient notes multiple residual varicosities bilaterally which continued to hurt with dependent positions and remained tender to palpation. The patient's swelling is minimally from preoperative status. The patient continues to wear graduated compression stockings on a daily basis but these are not eliminating the pain and discomfort. The patient continues to use over-the-counter anti-inflammatory medications to treat the pain and related symptoms but this has not given the patient relief. The patient notes the pain in the lower extremities is causing problems with daily exercise, problems at work and even with household activities such as preparing meals and doing dishes.  She also has some numbness near the ankle area post intervention.  The patient is otherwise done well and there have been no complications related to the laser procedure or interval changes in the patient's overall   Post laser ultrasound shows successful ablation of the left lower extremity      Review of Systems  Neurological:  Positive for numbness.  All other systems reviewed and are negative.      Objective:   Physical Exam Vitals reviewed.  HENT:     Head: Normocephalic.  Cardiovascular:     Rate and Rhythm: Normal rate.     Pulses: Normal pulses.  Pulmonary:     Effort: Pulmonary effort is normal.  Musculoskeletal:        General: Tenderness present.  Skin:    General: Skin is warm and dry.  Neurological:     Mental Status: She is alert and oriented to person, place, and time.  Psychiatric:        Mood  and Affect: Mood normal.        Behavior: Behavior normal.        Thought Content: Thought content normal.        Judgment: Judgment normal.     BP 102/71   Pulse 78   Resp 18   Wt 240 lb (108.9 kg)   BMI 37.59 kg/m   Past Medical History:  Diagnosis Date   AICD (automatic cardioverter/defibrillator) present 2008   a.) Medtronic device placed 2008. b.) replaced 06/21/2013 (Medtronic Evera XT single-chamber ICD; model number [id number]).   Anemia    Anxiety    Aortic dilatation (HCC) 03/24/2019   a.) TTE 03/24/2019: Ao root measured 37 mm. b.) TTE 03/21/2020: Ao root measured 37 mm.   Arthritis    Depression    Difficult intubation    DOE (dyspnea on exertion)    Endometriosis of vagina 09/2013   Intra-operative findings of endometriosis implants on cervical stump   Essential hypertension    GERD (gastroesophageal reflux disease)    Headache    HFrEF (heart failure with reduced ejection fraction) (HCC)    a.) TTE 02/18/2014: EF 40-45%; tiv AR, mild MR/TR/PR; G1DD. b.) TTE 02/22/2017: EF 45-50%; mild LVH; triv PR; G1DD. c.) TTE 03/24/2019: EF 45-50%; glob HK, mild LVH; triv MR/TR/PR. d.) TTE 03/21/2020: EF 40-45%; glob HK.   History of 2019 novel coronavirus disease (COVID-19) 02/02/2020   History of 2019 novel coronavirus disease (  COVID-19) 02/02/2020   Hypokalemia    Hypothyroidism    a.) s/p radioactive iodine  Tx; on levothyroxine    Mixed hyperlipidemia    Nonischemic cardiomyopathy (HCC)    a.) TTE 02/18/2014: EF 40-45%. b.) TTE 02/22/2017: EF 45-50%. c.) TTE 03/24/2019: EF 45-50%. d.) TTE 03/21/2020: EF 40-45%   OSA on CPAP    Panic attacks    PSVT (paroxysmal supraventricular tachycardia) (HCC) 03/30/2020   a.) Holter 03/30/2020 --> 2 runs; fastest lasting 4 beats (184 bpm); longest lasting 4 beats (113 bpm)   Pulmonary embolism (HCC)    Tachycardia     Social History   Socioeconomic History   Marital status: Married    Spouse name: Jimmy   Number of children:  2   Years of education: Not on file   Highest education level: Not on file  Occupational History   Not on file  Tobacco Use   Smoking status: Never   Smokeless tobacco: Never  Vaping Use   Vaping status: Never Used  Substance and Sexual Activity   Alcohol use: Yes    Alcohol/week: 0.0 standard drinks of alcohol    Comment: occassionally   Drug use: No   Sexual activity: Not Currently    Birth control/protection: Surgical  Other Topics Concern   Not on file  Social History Narrative   Not on file   Social Drivers of Health   Financial Resource Strain: Low Risk  (03/11/2023)   Received from [facility]   Overall Financial Resource Strain (CARDIA)    Difficulty of Paying Living Expenses: Not hard at all  Food Insecurity: No Food Insecurity (03/11/2023)   Received from [facility]   Hunger Vital Sign    Worried About Running Out of Food in the Last Year: Never true    Ran Out of Food in the Last Year: Never true  Transportation Needs: No Transportation Needs (03/11/2023)   Received from [facility] - Transportation    In the past 12 months, has lack of transportation kept you from medical appointments or from getting medications?: No    Lack of Transportation (Non-Medical): No  Physical Activity: Not on file  Stress: Not on file  Social Connections: Not on file  Intimate Partner Violence: Not At Risk (11/02/2022)   Humiliation, Afraid, Rape, and Kick questionnaire    Fear of Current or Ex-Partner: No    Emotionally Abused: No    Physically Abused: No    Sexually Abused: No    Past Surgical History:  Procedure Laterality Date   ANTERIOR AND POSTERIOR REPAIR N/A 11/25/2017   Procedure: ANTERIOR (CYSTOCELE) AND POSTERIOR REPAIR (RECTOCELE);  Surgeon: Teresa Fender, MD;  Location: [facility] ORS;  Service: Gynecology;  Laterality: N/A;   BREAST CYST ASPIRATION Left 03/22/2014   neg/ done by Dr Lorel Roes   CARDIAC  CATHETERIZATION  2003   [facility]: No significant coronary artery disease with reduced ejection fraction.   CARDIAC DEFIBRILLATOR PLACEMENT  04/2006   replaced 05/2013   CARDIAC DEFIBRILLATOR PLACEMENT  06/21/2013   Procedure:  CARDIAC DEFIBRILLATOR PLACEMENT (Medtronic single-chamber ICD, model number Venus Ginsberg, model number [id number]): Location: [facility]; Surgeon: Fredirick Jasmine, MD   CARPAL TUNNEL RELEASE Right    COLONOSCOPY  2015   COLONOSCOPY N/A 10/13/2020   Procedure: COLONOSCOPY;  Surgeon: Quintin Buckle, DO;  Location: [facility] ENDOSCOPY;  Service: Gastroenterology;  Laterality: N/A;   COLONOSCOPY WITH PROPOFOL  N/A 10/04/2015   Procedure: COLONOSCOPY WITH PROPOFOL ;  Surgeon: Marnee Sink, MD;  Location: [facility] ENDOSCOPY;  Service: Endoscopy;  Laterality: N/A;   CORONARY ANGIOPLASTY     CYSTOSCOPY  11/15/2014   Procedure: CYSTOSCOPY;  Surgeon: Teresa Fender, MD;  Location: [facility] ORS;  Service: Gynecology;;   DIAGNOSTIC LAPAROSCOPY     ETHMOIDECTOMY Right 07/19/2021   Procedure: TOTAL ETHMOIDECTOMY WITH FRONTAL SINUS EXPLORATION;  Surgeon: Mellody Sprout, MD;  Location: [facility] ORS;  Service: ENT;  Laterality: Right;   FOOT SURGERY Bilateral    heer spur and bunion   IMAGE GUIDED SINUS SURGERY N/A 07/19/2021   Procedure: IMAGE GUIDED SINUS SURGERY;  Surgeon: Mellody Sprout, MD;  Location: [facility] ORS;  Service: ENT;  Laterality: N/A;   KNEE ARTHROSCOPY Left 03/02/2015   Procedure: ARTHROSCOPY  LEFT KNEE, PARTIAL LATERAL  MENISECTOMY, SYNOVECTOMY, MEDIAL & LATERAL CHONDROPLASTY;  Surgeon: Rande Bushy, MD;  Location: [facility] ORS;  Service: Orthopedics;  Laterality: Left;   LAPAROSCOPIC SALPINGO OOPHERECTOMY Left 11/15/2014   Procedure: LAPAROSCOPIC OOPHORECTOMY;  Surgeon: Teresa Fender, MD;  Location: [facility] ORS;  Service: Gynecology;  Laterality: Left;   LAPAROSCOPIC SALPINGOOPHERECTOMY Right 09/2013   also with left salpingectomy   LEFT HEART CATH AND CORONARY ANGIOGRAPHY N/A 03/28/2020   Procedure:  LEFT HEART CATH AND CORONARY ANGIOGRAPHY;  Surgeon: Wenona Hamilton, MD;  Location: [facility] INVASIVE CV LAB;  Service: Cardiovascular;  Laterality: N/A;   MAXILLARY ANTROSTOMY Right 07/19/2021   Procedure: MAXILLARY ANTROSTOMY WITH REMOVAL TISSUE;  Surgeon: Mellody Sprout, MD;  Location: [facility] ORS;  Service: ENT;  Laterality: Right;   MINOR HEMORRHOIDECTOMY     NASAL TURBINATE REDUCTION Bilateral 07/19/2021   Procedure: INFERIOR TURBINATE REDUCTION;  Surgeon: Mellody Sprout, MD;  Location: [facility] OR[facility];  Service: ENT;  Laterality: Bilateral;   PULMONARY THROMBECTOMY Bilateral 10/03/2022   Procedure: PULMONARY THROMBECTOMY;  Surgeon: Celso College, MD;  Location: [facility] INVASIVE CV LAB;  Service: Cardiovascular;  Laterality: Bilateral;   SEPTOPLASTY N/A 07/19/2021   Procedure: SEPTOPLASTY;  Surgeon: Mellody Sprout, MD;  Location: [facility] OR[facility];  Service: ENT;  Laterality: N/A;   TONSILLECTOMY     TOTAL ABDOMINAL HYSTERECTOMY     TRACHELECTOMY N/A 11/15/2014   Procedure: TRACHELECTOMY;  Surgeon: Teresa Fender, MD;  Location: [facility] ORS;  Service: Gynecology;  Laterality: N/A;   TRIGGER FINGER RELEASE Right    TUBAL LIGATION Bilateral     Family History  Problem Relation Age of Onset   Hypertension Mother    Hyperlipidemia Mother    Stroke Mother    Colon cancer Mother    Breast cancer Sister 24   Ovarian cancer Sister    Throat cancer Brother     Allergies  Allergen Reactions   Sulfa Antibiotics Hives   Sulfamethoxazole-Trimethoprim Hives   Jardiance  [Empagliflozin ] Rash    Yeast rash       Latest Ref Rng & Units 05/24/2023    3:38 AM 02/22/2023    5:26 AM 12/14/2022   11:31 AM  CBC  WBC 4.0 - 10.5 K/uL 5.2  4.7  4.6   Hemoglobin 12.0 - 15.0 g/dL 16.1  09.6  04.5   Hematocrit 36.0 - 46.0 % 42.0  45.5  43.5   Platelets 150 - 400 K/uL 171  142  156       CMP     Component Value Date/Time   NA 135 05/24/2023 0338   NA 138 12/07/2022 1416   NA 137 06/01/2013 1003   K 3.8 05/24/2023 0338   K  3.6 06/01/2013 1003   CL 105 05/24/2023  0338   CL 104 06/01/2013 1003   CO2 22 05/24/2023 0338   CO2 29 06/01/2013 1003   GLUCOSE 93 05/24/2023 0338   GLUCOSE 107 (H) 06/01/2013 1003   BUN 15 05/24/2023 0338   BUN 14 12/07/2022 1416   BUN 11 06/01/2013 1003   CREATININE 1.08 (H) 05/24/2023 0338   CREATININE 0.69 06/01/2013 1003   CALCIUM 10.0 05/24/2023 0338   CALCIUM 9.2 06/01/2013 1003   PROT 7.6 05/24/2023 0338   PROT 6.6 07/21/2011 2246   ALBUMIN 4.1 05/24/2023 0338   ALBUMIN 3.2 (L) 07/21/2011 2246   AST 16 05/24/2023 0338   AST 26 07/21/2011 2246   ALT 16 05/24/2023 0338   ALT 31 07/21/2011 2246   ALKPHOS 70 05/24/2023 0338   ALKPHOS 117 07/21/2011 2246   BILITOT 0.4 05/24/2023 0338   BILITOT 0.3 07/21/2011 2246   EGFR 69 12/07/2022 1416   GFRNONAA 59 (L) 05/24/2023 0338   GFRNONAA >60 06/01/2013 1003     No results found.     Assessment & Plan:   1. Varicose veins of left lower extremity with inflammation (Primary) Recommend:  The patient has had successful ablation of the previously incompetent saphenous venous system but still has persistent symptoms of pain and swelling that are having a negative impact on daily life and daily activities.  Patient should undergo injection sclerotherapy to treat the residual varicosities.  The risks, benefits and alternative therapies were reviewed in detail with the patient.  All questions were answered.  The patient agrees to proceed with sclerotherapy at their convenience.  The patient will continue wearing the graduated compression stockings and using the over-the-counter pain medications to treat her symptoms.      2. Essential hypertension Continue antihypertensive medications as already ordered, these medications have been reviewed and there are no changes at this time.  3. Hypercholesteremia Continue statin as ordered and reviewed, no changes at this time   Current Outpatient Medications on File Prior to Visit   Medication Sig Dispense Refill   Acetaminophen  (TYLENOL  ARTHRITIS PAIN PO) Take 1,300 mg by mouth every 6 (six) hours as needed (Pain).     apixaban  (ELIQUIS ) 5 MG TABS tablet Take 5 mg by mouth 2 (two) times daily.     buPROPion (WELLBUTRIN XL) 150 MG 24 hr tablet Take 150 mg by mouth daily.     cyclobenzaprine  (FLEXERIL ) 10 MG tablet Take 1 tablet (10 mg total) by mouth 3 (three) times daily as needed for muscle spasms.     ENTRESTO  24-26 MG Take 1 tablet by mouth 2 (two) times daily.     esomeprazole (NEXIUM) 40 MG capsule Take 40 mg by mouth every morning.      furosemide  (LASIX ) 20 MG tablet Take one tablet (20mg ) by mouth Monday, Wednesday, Friday. 36 tablet 0   gabapentin  (NEURONTIN ) 100 MG capsule Take 100 mg by mouth 3 (three) times daily.     guaiFENesin -codeine  100-10 MG/5ML syrup Take 5 mLs by mouth every 6 (six) hours as needed for cough. 120 mL 0   levothyroxine  (SYNTHROID ) 125 MCG tablet Take 125 mcg by mouth every morning.     lidocaine  (LIDODERM ) 5 % Place 1 patch onto the skin daily for 15 days. 15 patch 0   Magnesium  250 MG TABS Take 1 tablet by mouth daily.     metoprolol  succinate (TOPROL -XL) 50 MG 24 hr tablet Take 1 tablet (50 mg total) by mouth daily. Take with or immediately following a meal. 90 tablet  3   oxyCODONE -acetaminophen  (PERCOCET/ROXICET) 5-325 MG tablet Take 1 tablet by mouth every 4 (four) hours as needed for severe pain.     sertraline  (ZOLOFT ) 100 MG tablet Take 200 mg by mouth daily.     spironolactone  (ALDACTONE ) 25 MG tablet Take 1 tablet (25 mg total) by mouth daily. 90 tablet 3   tirzepatide (MOUNJARO) 5 MG/0.5ML Pen Inject 5 mg into the skin once a week. 2 mL 0   No current facility-administered medications on file prior to visit.    There are no Patient Instructions on file for this visit. No follow-ups on file.   Gemini Beaumier E Kajuan Guyton, NP

## 2023-06-04 DIAGNOSIS — M25511 Pain in right shoulder: Secondary | ICD-10-CM | POA: Diagnosis not present

## 2023-06-04 DIAGNOSIS — M25611 Stiffness of right shoulder, not elsewhere classified: Secondary | ICD-10-CM | POA: Diagnosis not present

## 2023-06-07 ENCOUNTER — Telehealth (INDEPENDENT_AMBULATORY_CARE_PROVIDER_SITE_OTHER): Payer: Self-pay | Admitting: Nurse Practitioner

## 2023-06-07 NOTE — Telephone Encounter (Signed)
 ATC pt to schedule appt for saline sclerotherapy. Patient's number rang and then states call can't be completed as dialed. I attempted X 2 and received same message each time.

## 2023-06-11 DIAGNOSIS — M25611 Stiffness of right shoulder, not elsewhere classified: Secondary | ICD-10-CM | POA: Diagnosis not present

## 2023-06-11 DIAGNOSIS — M25511 Pain in right shoulder: Secondary | ICD-10-CM | POA: Diagnosis not present

## 2023-06-11 DIAGNOSIS — M25512 Pain in left shoulder: Secondary | ICD-10-CM | POA: Diagnosis not present

## 2023-06-13 DIAGNOSIS — M25611 Stiffness of right shoulder, not elsewhere classified: Secondary | ICD-10-CM | POA: Diagnosis not present

## 2023-06-13 DIAGNOSIS — M25511 Pain in right shoulder: Secondary | ICD-10-CM | POA: Diagnosis not present

## 2023-06-17 ENCOUNTER — Telehealth: Payer: Self-pay | Admitting: Cardiology

## 2023-06-17 NOTE — Telephone Encounter (Signed)
 Pt wants to talk to nurse about having some teeth pulled. Dentist is giving her a hard time about clearance.

## 2023-06-17 NOTE — Telephone Encounter (Signed)
 Pt called. Dental procedure clearance form left at front desk, patient will pick up form tomorrow.

## 2023-06-17 NOTE — Telephone Encounter (Signed)
 Patient states she needs a letter that she is okay to have her tooth pulled tomorrow.   I advised I would check with MD, but for one tooth pull- do they hold Eliquis ?   Thank you!

## 2023-06-18 ENCOUNTER — Encounter (INDEPENDENT_AMBULATORY_CARE_PROVIDER_SITE_OTHER): Payer: Self-pay

## 2023-06-18 ENCOUNTER — Ambulatory Visit (INDEPENDENT_AMBULATORY_CARE_PROVIDER_SITE_OTHER): Payer: Medicare HMO

## 2023-06-18 DIAGNOSIS — M25611 Stiffness of right shoulder, not elsewhere classified: Secondary | ICD-10-CM | POA: Diagnosis not present

## 2023-06-18 DIAGNOSIS — I428 Other cardiomyopathies: Secondary | ICD-10-CM

## 2023-06-18 DIAGNOSIS — M25511 Pain in right shoulder: Secondary | ICD-10-CM | POA: Diagnosis not present

## 2023-06-19 LAB — CUP PACEART REMOTE DEVICE CHECK
Battery Remaining Longevity: 26 mo
Battery Voltage: 2.94 V
Brady Statistic RV Percent Paced: 0.01 %
Date Time Interrogation Session: 20250520044224
HighPow Impedance: 59 Ohm
HighPow Impedance: 73 Ohm
Implantable Lead Connection Status: 753985
Implantable Lead Implant Date: 20150508
Implantable Lead Location: 753860
Implantable Lead Model: 185
Implantable Lead Serial Number: 194163
Implantable Pulse Generator Implant Date: 20150508
Lead Channel Impedance Value: 551 Ohm
Lead Channel Impedance Value: 551 Ohm
Lead Channel Pacing Threshold Amplitude: 0.625 V
Lead Channel Pacing Threshold Pulse Width: 0.4 ms
Lead Channel Sensing Intrinsic Amplitude: 12 mV
Lead Channel Sensing Intrinsic Amplitude: 12 mV
Lead Channel Setting Pacing Amplitude: 2 V
Lead Channel Setting Pacing Pulse Width: 0.4 ms
Lead Channel Setting Sensing Sensitivity: 0.3 mV
Zone Setting Status: 755011

## 2023-06-20 DIAGNOSIS — M25511 Pain in right shoulder: Secondary | ICD-10-CM | POA: Diagnosis not present

## 2023-06-20 DIAGNOSIS — M25611 Stiffness of right shoulder, not elsewhere classified: Secondary | ICD-10-CM | POA: Diagnosis not present

## 2023-06-26 ENCOUNTER — Ambulatory Visit: Payer: Self-pay | Admitting: Cardiovascular Disease

## 2023-06-27 ENCOUNTER — Telehealth: Payer: Self-pay | Admitting: Pharmacist

## 2023-06-27 MED ORDER — MOUNJARO 7.5 MG/0.5ML ~~LOC~~ SOAJ: 7.5000 mg | SUBCUTANEOUS | 0 refills | Status: DC

## 2023-06-27 NOTE — Telephone Encounter (Signed)
 Patient tolerating Mounjaro well other than a small area of inflammation with itching for ~2 days after injection. Patient will take antihistamines prior to the next dose to see if that helps.

## 2023-06-28 DIAGNOSIS — M25511 Pain in right shoulder: Secondary | ICD-10-CM | POA: Diagnosis not present

## 2023-06-28 DIAGNOSIS — M25611 Stiffness of right shoulder, not elsewhere classified: Secondary | ICD-10-CM | POA: Diagnosis not present

## 2023-07-02 DIAGNOSIS — M25511 Pain in right shoulder: Secondary | ICD-10-CM | POA: Diagnosis not present

## 2023-07-02 DIAGNOSIS — M25611 Stiffness of right shoulder, not elsewhere classified: Secondary | ICD-10-CM | POA: Diagnosis not present

## 2023-07-09 DIAGNOSIS — M25611 Stiffness of right shoulder, not elsewhere classified: Secondary | ICD-10-CM | POA: Diagnosis not present

## 2023-07-09 DIAGNOSIS — M25511 Pain in right shoulder: Secondary | ICD-10-CM | POA: Diagnosis not present

## 2023-07-11 DIAGNOSIS — M25511 Pain in right shoulder: Secondary | ICD-10-CM | POA: Diagnosis not present

## 2023-07-11 DIAGNOSIS — M25611 Stiffness of right shoulder, not elsewhere classified: Secondary | ICD-10-CM | POA: Diagnosis not present

## 2023-07-14 ENCOUNTER — Emergency Department
Admission: EM | Admit: 2023-07-14 | Discharge: 2023-07-14 | Disposition: A | Discharge status: Home / Self Care | Attending: Emergency Medicine | Admitting: Emergency Medicine

## 2023-07-14 ENCOUNTER — Emergency Department

## 2023-07-14 ENCOUNTER — Other Ambulatory Visit: Payer: Self-pay

## 2023-07-14 DIAGNOSIS — M25511 Pain in right shoulder: Secondary | ICD-10-CM | POA: Insufficient documentation

## 2023-07-14 DIAGNOSIS — Z7901 Long term (current) use of anticoagulants: Secondary | ICD-10-CM | POA: Insufficient documentation

## 2023-07-14 DIAGNOSIS — I509 Heart failure, unspecified: Secondary | ICD-10-CM | POA: Insufficient documentation

## 2023-07-14 DIAGNOSIS — M778 Other enthesopathies, not elsewhere classified: Secondary | ICD-10-CM | POA: Diagnosis not present

## 2023-07-14 DIAGNOSIS — W01190A Fall on same level from slipping, tripping and stumbling with subsequent striking against furniture, initial encounter: Secondary | ICD-10-CM | POA: Diagnosis not present

## 2023-07-14 DIAGNOSIS — R079 Chest pain, unspecified: Secondary | ICD-10-CM | POA: Diagnosis not present

## 2023-07-14 DIAGNOSIS — R0789 Other chest pain: Secondary | ICD-10-CM | POA: Diagnosis not present

## 2023-07-14 DIAGNOSIS — I110 Hypertensive heart disease with heart failure: Secondary | ICD-10-CM | POA: Insufficient documentation

## 2023-07-14 DIAGNOSIS — W19XXXA Unspecified fall, initial encounter: Secondary | ICD-10-CM

## 2023-07-14 DIAGNOSIS — M79601 Pain in right arm: Secondary | ICD-10-CM | POA: Diagnosis not present

## 2023-07-14 DIAGNOSIS — M19011 Primary osteoarthritis, right shoulder: Secondary | ICD-10-CM | POA: Diagnosis not present

## 2023-07-14 DIAGNOSIS — S4991XA Unspecified injury of right shoulder and upper arm, initial encounter: Secondary | ICD-10-CM | POA: Diagnosis not present

## 2023-07-14 DIAGNOSIS — M79621 Pain in right upper arm: Secondary | ICD-10-CM | POA: Diagnosis not present

## 2023-07-14 LAB — COMPREHENSIVE METABOLIC PANEL WITH GFR
ALT: 18 U/L (ref 0–44)
AST: 20 U/L (ref 15–41)
Albumin: 4.3 g/dL (ref 3.5–5.0)
Alkaline Phosphatase: 56 U/L (ref 38–126)
Anion gap: 8 (ref 5–15)
BUN: 12 mg/dL (ref 6–20)
CO2: 25 mmol/L (ref 22–32)
Calcium: 9.8 mg/dL (ref 8.9–10.3)
Chloride: 102 mmol/L (ref 98–111)
Creatinine, Ser: 0.89 mg/dL (ref 0.44–1.00)
GFR, Estimated: >60 mL/min (ref >60)
Glucose, Bld: 94 mg/dL (ref 70–99)
Potassium: 3.1 mmol/L — ABNORMAL LOW (ref 3.5–5.1)
Sodium: 135 mmol/L (ref 135–145)
Total Bilirubin: 0.9 mg/dL (ref 0.0–1.2)
Total Protein: 7.3 g/dL (ref 6.5–8.1)

## 2023-07-14 LAB — CBC WITH DIFFERENTIAL/PLATELET
Abs Immature Granulocytes: 0 10*3/uL (ref 0.00–0.07)
Basophils Absolute: 0 10*3/uL (ref 0.0–0.1)
Basophils Relative: 0 %
Eosinophils Absolute: 0 10*3/uL (ref 0.0–0.5)
Eosinophils Relative: 1 %
HCT: 43.7 % (ref 36.0–46.0)
Hemoglobin: 14.3 g/dL (ref 12.0–15.0)
Immature Granulocytes: 0 %
Lymphocytes Relative: 41 %
Lymphs Abs: 1.7 10*3/uL (ref 0.7–4.0)
MCH: 30.4 pg (ref 26.0–34.0)
MCHC: 32.7 g/dL (ref 30.0–36.0)
MCV: 92.8 fL (ref 80.0–100.0)
Monocytes Absolute: 0.2 10*3/uL (ref 0.1–1.0)
Monocytes Relative: 6 %
Neutro Abs: 2.2 10*3/uL (ref 1.7–7.7)
Neutrophils Relative %: 52 %
Platelets: 173 10*3/uL (ref 150–400)
RBC: 4.71 MIL/uL (ref 3.87–5.11)
RDW: 14.5 % (ref 11.5–15.5)
WBC: 4.2 10*3/uL (ref 4.0–10.5)
nRBC: 0 % (ref 0.0–0.2)

## 2023-07-14 LAB — TROPONIN I (HIGH SENSITIVITY): Troponin I (High Sensitivity): 3 ng/L (ref <18)

## 2023-07-14 MED ORDER — ACETAMINOPHEN 500 MG PO TABS
1000.0000 mg | ORAL_TABLET | Freq: Once | ORAL | Status: AC
  Administered 2023-07-14: 1000 mg via ORAL
  Filled 2023-07-14: qty 2

## 2023-07-14 MED ORDER — POTASSIUM CHLORIDE CRYS ER 20 MEQ PO TBCR
40.0000 meq | EXTENDED_RELEASE_TABLET | Freq: Once | ORAL | Status: AC
  Administered 2023-07-14: 40 meq via ORAL
  Filled 2023-07-14: qty 2

## 2023-07-14 MED ORDER — LIDOCAINE 5 % EX PTCH
1.0000 | MEDICATED_PATCH | CUTANEOUS | Status: DC
  Administered 2023-07-14: 1 via TRANSDERMAL
  Filled 2023-07-14: qty 1

## 2023-07-14 MED ORDER — LIDOCAINE 5 % EX PTCH: 1.0000 | MEDICATED_PATCH | CUTANEOUS | 0 refills | Status: AC

## 2023-07-14 NOTE — Discharge Instructions (Addendum)
 You can take 500 mg of Tylenol  every 6 hours as needed for pain.  Also you can use the Lidoderm  patches that I prescribed.  Please make sure to follow-up with your primary care doctor for further management of your symptoms.

## 2023-07-14 NOTE — ED Provider Notes (Signed)
 [facility] Provider Note    Event Date/Time   First MD Initiated Contact with Patient 07/14/23 1638     (approximate)   History   Fall   HPI  Lisa Fuentes is a 63 y.o. female with history of GERD, hypertension, hyperlipidemia, heart failure, on Eliquis , presenting with right shoulder pain after fall.  States that she tripped and fell, landed on her chest and knees.  Fall had occurred yesterday.  She denies any head strike, no LOC.  She denies any focal weakness or numbness.  Is ambulatory.    On independent review, she is on Eliquis  5 mg twice daily.  Echo from 24 showed EF of 25 to 30%.  Physical Exam   Triage Vital Signs: ED Triage Vitals [07/14/23 1607]  Encounter Vitals Group     BP 108/82     Girls Systolic BP Percentile      Girls Diastolic BP Percentile      Boys Systolic BP Percentile      Boys Diastolic BP Percentile      Pulse Rate 88     Resp 16     Temp 97.9 F (36.6 C)     Temp Source Oral     SpO2 98 %     Weight      Height 5' 7 (1.702 m)     Head Circumference      Peak Flow      Pain Score 0     Pain Loc      Pain Education      Exclude from Growth Chart     Most recent vital signs: Vitals:   07/14/23 1607 07/14/23 1639  BP: 108/82   Pulse: 88   Resp: 16   Temp: 97.9 F (36.6 C)   SpO2: 98% 98%     General: Awake, no distress.  CV:  Good peripheral perfusion.  Resp:  Normal effort.  Right upper anterior thoracic cage tenderness without overlying ecchymoses or swelling or erythema. Abd:  No distention.  Soft nontender Other:  Right shoulder tender to palpation, right posterior mid humeral tenderness.  No swelling or palpable deformities, able to passively range her right upper extremity, able to range her left upper extremity as well as bilateral lower extremities without focal weakness or numbness, equal radial pulses and DP pulses bilaterally, no midline spinal tenderness, no palpable skull deformities or  tenderness, no facial deformities or tenderness, she does have healing ecchymoses to her bilateral legs.  Superficial healing laceration to top of her right foot, no bony tenderness to her left upper extremity, bilateral lower extremities.   ED Results / Procedures / Treatments   Labs (all labs ordered are listed, but only abnormal results are displayed) Labs Reviewed  COMPREHENSIVE METABOLIC PANEL WITH GFR - Abnormal; Notable for the following components:      Result Value   Potassium 3.1 (*)    All other components within normal limits  CBC WITH DIFFERENTIAL/PLATELET  TROPONIN I (HIGH SENSITIVITY)     EKG  EKG shows, sinus rhythm, rate 79, normal QS, normal QTc, no ischemic ST elevation, T wave flattening to V3, V5, V6, not significantly changed compared to prior   RADIOLOGY On my independent interpretation, x-ray shoulder without obvious fracture   PROCEDURES:  Critical Care performed: No  Procedures   MEDICATIONS ORDERED IN ED: Medications  lidocaine  (LIDODERM ) 5 % 1 patch (1 patch Transdermal Patch Applied 07/14/23 1715)  potassium chloride  SA (KLOR-CON   M) CR tablet 40 mEq (has no administration in time range)  acetaminophen  (TYLENOL ) tablet 1,000 mg (1,000 mg Oral Given 07/14/23 1711)     IMPRESSION / MDM / ASSESSMENT AND PLAN / ED COURSE  I reviewed the triage vital signs and the nursing notes.                              Differential diagnosis includes, but is not limited to, strain, sprain, contusion, also considered fractures, no palpable deformities to suggest dislocation.  Superficial healing laceration to right foot does not require any repair.  Will get labs, EKG, troponin, chest x-ray, shoulder x-ray, humeral x-ray.  Tylenol  Lidoderm  patch.  Patient denies hitting her head, LOC, no indication for CT imaging at this time.  Patient's presentation is most consistent with acute presentation with potential threat to life or bodily function.  Independent  interpretation of labs and imaging below.  Clinical course as below.  On reassessment patient's pain is controlled, no indication for inpatient admission at this time, she is safe for outpatient management.  Will discharge with strict return precautions.  Instructed her to follow-up with her primary care doctor this week for reassessment.  Shared decision making done with patient and she is agreeable with this plan.  Discharged.    Clinical Course as of 07/14/23 1823  Sun Jul 14, 2023  1659 DG Shoulder Right 1. Degenerative changes of the right shoulder.  No acute fracture.  [TT]  1719 Troponin I (High Sensitivity): 3 Not elevated [TT]  1801 DG Chest 2 View No active cardiopulmonary disease.  [TT]  1802 Independent review of labs, no leukocytosis, her potassium is 3.1, will replete, rest electrolytes not severely deranged, LFTs are normal, troponin is not elevated. [TT]  1819 DG Humerus Right No acute displaced fracture or dislocation.  [TT]    Clinical Course User Index [TT] Shane Darling, MD     FINAL CLINICAL IMPRESSION(S) / ED DIAGNOSES   Final diagnoses:  Fall, initial encounter  Acute pain of right shoulder  Pain of right upper extremity  Chest pain, unspecified type     Rx / DC Orders   ED Discharge Orders          Ordered    lidocaine  (LIDODERM ) 5 %  Every 24 hours        07/14/23 1820             Note:  This document was prepared using Dragon voice recognition software and may include unintentional dictation errors.    Shane Darling, MD 07/14/23 [id number]

## 2023-07-14 NOTE — Telephone Encounter (Signed)
 Pt with hx of non-ischemic cardiomyopathy, prior PE on Eliquis . She tripped and fell on concrete yesterday. She called to see if she needs to be evaluated for bleeding. She did not hit her head. She is concerned about a rib Fx. I advised her to go to the ED if she is having severe pain or feels like she has a significant injury. Marlyse Single, PA-C    07/14/2023 3:15 PM

## 2023-07-14 NOTE — ED Notes (Signed)
 Blue top sent down.

## 2023-07-14 NOTE — ED Triage Notes (Addendum)
 Pt to ed from home via POV for a fall. Pt states I tripped and fell and landed on her chest and knees. Fall occurred yesterday.  Pt denies hitting her head. Pt is on blood thinners. Pt is caox4, denies any LOC, in no acute distress and ambulatory in triage. Pt has pain in toes, and in her right shoulder.

## 2023-07-16 DIAGNOSIS — M25611 Stiffness of right shoulder, not elsewhere classified: Secondary | ICD-10-CM | POA: Diagnosis not present

## 2023-07-16 DIAGNOSIS — M25511 Pain in right shoulder: Secondary | ICD-10-CM | POA: Diagnosis not present

## 2023-07-17 ENCOUNTER — Ambulatory Visit (INDEPENDENT_AMBULATORY_CARE_PROVIDER_SITE_OTHER): Admitting: Nurse Practitioner

## 2023-07-18 ENCOUNTER — Telehealth: Payer: Self-pay | Admitting: Cardiology

## 2023-07-18 NOTE — Telephone Encounter (Signed)
 Called to confirm/remind patient of their appointment at [facility] on 07/19/23.   Appointment:   [x] Confirmed  [] Left mess   [] No answer/No voice mail  [] VM Full/unable to leave message  [] Phone not in service  Patient reminded to bring all medications and/or complete list.  Confirmed patient has transportation. Gave directions, instructed to utilize valet parking.

## 2023-07-19 ENCOUNTER — Ambulatory Visit: Attending: Cardiology | Admitting: Cardiology

## 2023-07-19 ENCOUNTER — Encounter: Payer: Self-pay | Admitting: Cardiology

## 2023-07-19 VITALS — BP 104/66 | HR 102 | Wt 229.2 lb

## 2023-07-19 DIAGNOSIS — I428 Other cardiomyopathies: Secondary | ICD-10-CM

## 2023-07-19 DIAGNOSIS — G4733 Obstructive sleep apnea (adult) (pediatric): Secondary | ICD-10-CM | POA: Insufficient documentation

## 2023-07-19 DIAGNOSIS — I110 Hypertensive heart disease with heart failure: Secondary | ICD-10-CM | POA: Insufficient documentation

## 2023-07-19 DIAGNOSIS — Z86711 Personal history of pulmonary embolism: Secondary | ICD-10-CM | POA: Insufficient documentation

## 2023-07-19 DIAGNOSIS — Z86718 Personal history of other venous thrombosis and embolism: Secondary | ICD-10-CM | POA: Insufficient documentation

## 2023-07-19 DIAGNOSIS — I50810 Right heart failure, unspecified: Secondary | ICD-10-CM | POA: Diagnosis not present

## 2023-07-19 DIAGNOSIS — I5022 Chronic systolic (congestive) heart failure: Secondary | ICD-10-CM | POA: Diagnosis not present

## 2023-07-19 DIAGNOSIS — Z6835 Body mass index (BMI) 35.0-35.9, adult: Secondary | ICD-10-CM | POA: Insufficient documentation

## 2023-07-19 DIAGNOSIS — Z8249 Family history of ischemic heart disease and other diseases of the circulatory system: Secondary | ICD-10-CM | POA: Insufficient documentation

## 2023-07-19 DIAGNOSIS — Z79899 Other long term (current) drug therapy: Secondary | ICD-10-CM | POA: Insufficient documentation

## 2023-07-19 DIAGNOSIS — Z8616 Personal history of COVID-19: Secondary | ICD-10-CM | POA: Diagnosis not present

## 2023-07-19 DIAGNOSIS — Z95810 Presence of automatic (implantable) cardiac defibrillator: Secondary | ICD-10-CM | POA: Diagnosis not present

## 2023-07-19 DIAGNOSIS — Z7901 Long term (current) use of anticoagulants: Secondary | ICD-10-CM | POA: Insufficient documentation

## 2023-07-19 MED ORDER — DIGOXIN 125 MCG PO TABS: 0.1250 mg | ORAL_TABLET | Freq: Every day | ORAL | 3 refills | Status: DC

## 2023-07-19 NOTE — Addendum Note (Signed)
 Addended by: Autry Legions A on: 07/19/2023 02:51 PM   Modules accepted: Orders

## 2023-07-19 NOTE — Progress Notes (Signed)
 [facility]  NOTE   Primary Care: Lyle San, MD Primary Cardiologist: Constancia Delton, MD  HF provider: Alwin Baars, MD   Chief complaint: Heart failure  HPI:  Lisa Fuentes is a 63 y.o. female with a hx of systolic HF due to NICM, VT s/p MDT ICD morbid obesity, hypertension, PE s/p endovascular thrombolysis 9/24 on Eliquis , OSA and RUE DVT (06/23). Diagnosed with systolic HF in 2000. Cath 2003 no CAD. Had repeat cath 2022 with normal cors.  Had RUE DVT 6/23 after trauma. Upper extremity u/s with multiple RUE DVTs. CTA chest LLL segmental and subsegmental pulmonary emboli without evidence of RV strain. Started on Eliquis . She completed several months of Eliquis , and it was then discontinued per hematology.   Admitted [facility] 10/01/2022. CTA chest with moderate volume of acute PE with RV strain. She was started on heparin . She underwent pulmonary thrombectomy. D/c'd on Eliquis . Nuclear stress test on 10/03/2022,  LVEF 44% with moderate fixed basal septal defect without ischemia. Echo 10/03/22 EF 30-35% mild TR. Was in the ED 10/10/22 due to mid-sternal chest pain and shortness of breath. Labs normal.   Echo 02/18/14: EF 40-45% with Grade I DD Echo 02/22/17: EF 45-50% with Grade I DD Echo 03/24/19: EF 45-50% with mild LVH, Grade I DD, borderline aortic root dilatation 37 mm Echo 03/21/20: EF 40-45% with Grade I DD, borderline aortic root dilatation 37 mm Echo 10/03/22: EF 30-35% with moderate LVH, Grade I DD, mild LAE  LHC 03/28/20:  1.  Normal coronary arteries. 2.  Left ventricular angiography was not performed.  EF was mildly reduced by echo. 3.  Mildly elevated left ventricular end-diastolic pressure 20 mmHg.  She was scheduled for PYP scan but it looks like she had a nuclear stress test instead on 01/09/23 EF 40%   Interval hx:  Reports she has felt fatigued over the past few days.    SH:  Social History   Socioeconomic History   Marital status: Married    Spouse  name: Jimmy   Number of children: 2   Years of education: Not on file   Highest education level: Not on file  Occupational History   Not on file  Tobacco Use   Smoking status: Never   Smokeless tobacco: Never  Vaping Use   Vaping status: Never Used  Substance and Sexual Activity   Alcohol use: Yes    Alcohol/week: 0.0 standard drinks of alcohol    Comment: occassionally   Drug use: No   Sexual activity: Not Currently    Birth control/protection: Surgical  Other Topics Concern   Not on file  Social History Narrative   Not on file   Social Drivers of Health   Financial Resource Strain: Low Risk  (03/11/2023)   Received from [facility]   Overall Financial Resource Strain (CARDIA)    Difficulty of Paying Living Expenses: Not hard at all  Food Insecurity: No Food Insecurity (03/11/2023)   Received from [facility]   Hunger Vital Sign    Within the past 12 months, you worried that your food would run out before you got the money to buy more.: Never true    Within the past 12 months, the food you bought just didn't last and you didn't have money to get more.: Never true  Transportation Needs: No Transportation Needs (03/11/2023)   Received from [facility] - Transportation    In the past 12  months, has lack of transportation kept you from medical appointments or from getting medications?: No    Lack of Transportation (Non-Medical): No  Physical Activity: Not on file  Stress: Not on file  Social Connections: Not on file  Intimate Partner Violence: Not At Risk (11/02/2022)   Humiliation, Afraid, Rape, and Kick questionnaire    Fear of Current or Ex-Partner: No    Emotionally Abused: No    Physically Abused: No    Sexually Abused: No    FH:  Family History  Problem Relation Age of Onset   Hypertension Mother    Hyperlipidemia Mother    Stroke Mother    Colon cancer Mother    Breast cancer Sister 19   Ovarian  cancer Sister    Throat cancer Brother     Past Medical History:  Diagnosis Date   AICD (automatic cardioverter/defibrillator) present 2008   a.) Medtronic device placed 2008. b.) replaced 06/21/2013 (Medtronic Evera XT single-chamber ICD; model number [id number]).   Anemia    Anxiety    Aortic dilatation (HCC) 03/24/2019   a.) TTE 03/24/2019: Ao root measured 37 mm. b.) TTE 03/21/2020: Ao root measured 37 mm.   Arthritis    Depression    Difficult intubation    DOE (dyspnea on exertion)    Endometriosis of vagina 09/2013   Intra-operative findings of endometriosis implants on cervical stump   Essential hypertension    GERD (gastroesophageal reflux disease)    Headache    HFrEF (heart failure with reduced ejection fraction) (HCC)    a.) TTE 02/18/2014: EF 40-45%; tiv AR, mild MR/TR/PR; G1DD. b.) TTE 02/22/2017: EF 45-50%; mild LVH; triv PR; G1DD. c.) TTE 03/24/2019: EF 45-50%; glob HK, mild LVH; triv MR/TR/PR. d.) TTE 03/21/2020: EF 40-45%; glob HK.   History of 2019 novel coronavirus disease (COVID-19) 02/02/2020   History of 2019 novel coronavirus disease (COVID-19) 02/02/2020   Hypokalemia    Hypothyroidism    a.) s/p radioactive iodine  Tx; on levothyroxine    Mixed hyperlipidemia    Nonischemic cardiomyopathy (HCC)    a.) TTE 02/18/2014: EF 40-45%. b.) TTE 02/22/2017: EF 45-50%. c.) TTE 03/24/2019: EF 45-50%. d.) TTE 03/21/2020: EF 40-45%   OSA on CPAP    Panic attacks    PSVT (paroxysmal supraventricular tachycardia) (HCC) 03/30/2020   a.) Holter 03/30/2020 --> 2 runs; fastest lasting 4 beats (184 bpm); longest lasting 4 beats (113 bpm)   Pulmonary embolism (HCC)    Tachycardia     Current Outpatient Medications  Medication Sig Dispense Refill   Acetaminophen  (TYLENOL  ARTHRITIS PAIN PO) Take 1,300 mg by mouth every 6 (six) hours as needed (Pain).     apixaban  (ELIQUIS ) 5 MG TABS tablet Take 5 mg by mouth 2 (two) times daily.     ENTRESTO  24-26 MG Take 1 tablet by mouth 2  (two) times daily.     esomeprazole (NEXIUM) 40 MG capsule Take 40 mg by mouth every morning.      furosemide  (LASIX ) 20 MG tablet Take one tablet (20mg ) by mouth Monday, Wednesday, Friday. 36 tablet 0   gabapentin  (NEURONTIN ) 100 MG capsule Take 100 mg by mouth 3 (three) times daily.     levothyroxine  (SYNTHROID ) 125 MCG tablet Take 125 mcg by mouth every morning.     lidocaine  (LIDODERM ) 5 % Place 1 patch onto the skin daily. Remove & Discard patch within 12 hours or as directed by MD 30 patch 0   Magnesium  250 MG TABS Take 1 tablet by  mouth daily.     metoprolol  succinate (TOPROL -XL) 50 MG 24 hr tablet Take 1 tablet (50 mg total) by mouth daily. Take with or immediately following a meal. 90 tablet 3   oxyCODONE -acetaminophen  (PERCOCET/ROXICET) 5-325 MG tablet Take 1 tablet by mouth every 4 (four) hours as needed for severe pain.     sertraline  (ZOLOFT ) 100 MG tablet Take 200 mg by mouth daily.     spironolactone  (ALDACTONE ) 25 MG tablet Take 1 tablet (25 mg total) by mouth daily. 90 tablet 3   tirzepatide  (MOUNJARO ) 7.5 MG/0.5ML Pen Inject 7.5 mg into the skin once a week. 2 mL 0   No current facility-administered medications for this visit.   Vitals:   07/19/23 1415  BP: 104/66  Pulse: (!) 102  SpO2: 98%  Weight: 229 lb 3.2 oz (104 kg)    Wt Readings from Last 3 Encounters:  07/19/23 229 lb 3.2 oz (104 kg)  05/31/23 240 lb (108.9 kg)  05/24/23 239 lb (108.4 kg)   Lab Results  Component Value Date   CREATININE 0.89 07/14/2023   CREATININE 1.08 (H) 05/24/2023   CREATININE 0.92 04/12/2023   PHYSICAL EXAM: Vitals:   07/19/23 1415  BP: 104/66  Pulse: (!) 102  SpO2: 98%   GENERAL: NAD Lungs- CTA CARDIAC:  JVP: 7 cm          Normal rate with regular rhythm. NO murmur.  Pulses 2. NO edema.  ABDOMEN: Soft, non-tender, non-distended.  EXTREMITIES: Warm and well perfused.  NEUROLOGIC: No obvious FND   ASSESSMENT & PLAN:  1. NICM with reduced ejection fraction- - cath  2003 and 2022 no CAD - nuclear stress test on 10/03/2022 EF 44% with moderate fixed basal septal defect without evidence of ischemia.  - Stable NYHA II-III - Volume sttus looks good on exam - ICD interrogated persoanlly as above. Volume ws up but now down. Needs to continue daily lasix   - Echo 02/18/14: EF 40-45% with Grade I DD - Echo 02/22/17: EF 45-50% with Grade I DD - Echo 03/24/19: EF 45-50% with mild LVH, Grade I DD, borderline aortic root dilatation 37 mm  -Echo 03/21/20: EF 40-45% with Grade I DD, borderline aortic root dilatation 37 mm - Echo 10/03/22: EF 30-35% with moderate LVH, Grade I DD, mild LAE - 12/13/22: Echo LVEF 25-30%, normal RV function.  - 12/24 Myoview   EF 40% - continue entresto  24/26mg  BID - continues to take lasix  20mg  every other day; euvolemic on exam today.  - continue spironolactone  25mg  daily - continue metoprolol  succinate 75 mg daily; recently increased by EP  - will continue to hold SGLT2i; she reports having a very severe yeast infections - She has had carpal tunnel in both hands now s/p surgical repair; although, her TTE does not appear like a typical case of amyloid, will obtain PYP. She is concerned about having ATTR amyloid after reading about the disease. She was scheduled for PYP scan but it looks like she had a nuclear stress test instead on 01/09/23 EF 40%. I have re-ordered PYP - Repeat labs today - No echo since 2024; will order repeat.  - She feels very fatigued; will start digoxin  125mcg daily. Refer to cardiac rehab. - Euvolemic on exam today.   2. RV Failure- - TTE from 10/03/22 with at least moderately reduced RV function 2/2 acute PE; suspect this has improved after thrombectomy.  - TTE 12/13/22 w/ LVEF 25%-30%, with low normal RV function now. Euvolemic on exam with no clinical  signs of RV failure - Start digoxin  - Repeat TTE; RHC if function is declined further.   3. H/o VT- - s/p MDT ICD - device interrogation today with no VT/VF.   4.  Recurrent DVT/PE- - s/p thrombectomy - 12/13/22: Echo LVEF 25-30%, normal RV function.  - continue lifelong Eliquis  -> after 1 year can decrease to 2.5 bid per [employer]-EXT data - saw cardiology (Agbor-Etang) 10/24 - No evidence of RV strain on echo   5. Morbid obesity- - Body mass index is Body mass index is 35.9 kg/m. - Off wegovy  due lack of insurance denial  - Encouraged to get Mounjaro . Will have HF Pharmacist see her today  6: HTN- - Blood pressure well controlled. Continue current regimen.  7. OSA - Compliant with CPAP I spent 35 minutes caring for this patient today including face to face time, ordering and reviewing labs,discussing cardiac rehab, reviewing labs, seeing the patient, documenting in the record, and arranging follow ups.   Zian Mohamed, DO  2:21 PM

## 2023-07-19 NOTE — Patient Instructions (Signed)
 Medication Changes:  START Digoxin  0.125mg  (1 tab) daily  Lab Work:  Go over to the [facility]. Go pass the gift shop and have your blood work completed. THIS WILL NEED TO BE DONE IN 1 WEEK.  We will only call you if the results are abnormal or if the provider would like to make medication changes.   Testing/Procedures:  Your physician has requested that you have an echocardiogram. Echocardiography is a painless test that uses sound waves to create images of your heart. It provides your doctor with information about the size and shape of your heart and how well your heart's chambers and valves are working. This procedure takes approximately one hour. There are no restrictions for this procedure. Please do NOT wear cologne, perfume, aftershave, or lotions (deodorant is allowed). Please arrive 15 minutes prior to your appointment time.  Please note: We ask at that you not bring children with you during ultrasound (echo/ vascular) testing. Due to room size and safety concerns, children are not allowed in the ultrasound rooms during exams. Our front office staff cannot provide observation of children in our lobby area while testing is being conducted. An adult accompanying a patient to their appointment will only be allowed in the ultrasound room at the discretion of the ultrasound technician under special circumstances. We apologize for any inconvenience.   Referrals:  You have been referred to [facility] cardiac rehab. They will reach out to you in order to schedule an appointment.    Follow-Up in: Please follow up with the [facility] in 3 months with Dr. Julane Ny.   At the [facility], you and your health needs are our priority. We have a designated team specialized in the treatment of Heart Failure. This Care Team includes your primary Heart Failure Specialized Cardiologist (physician), Advanced Practice Providers (APPs- Physician Assistants and Nurse  Practitioners), and Pharmacist who all work together to provide you with the care you need, when you need it.   You may see any of the following providers on your designated Care Team at your next follow up:  Dr. Jules Oar Dr. Peder Bourdon Dr. Alwin Baars Dr. Judyth Nunnery Shawnee Dellen, FNP Bevely Brush, RPH-CPP  Please be sure to bring in all your medications bottles to every appointment.   Need to Contact Us :  If you have any questions or concerns before your next appointment please send us  a message through [place] or call our office at [phone].    TO LEAVE A MESSAGE FOR THE NURSE SELECT OPTION 2, PLEASE LEAVE A MESSAGE INCLUDING: YOUR NAME DATE OF BIRTH CALL BACK NUMBER REASON FOR CALL**this is important as we prioritize the call backs  YOU WILL RECEIVE A CALL BACK THE SAME DAY AS LONG AS YOU CALL BEFORE 4:00 PM

## 2023-07-23 DIAGNOSIS — M25611 Stiffness of right shoulder, not elsewhere classified: Secondary | ICD-10-CM | POA: Diagnosis not present

## 2023-07-23 DIAGNOSIS — M25511 Pain in right shoulder: Secondary | ICD-10-CM | POA: Diagnosis not present

## 2023-07-25 ENCOUNTER — Telehealth: Payer: Self-pay | Admitting: Pharmacist

## 2023-07-25 MED ORDER — MOUNJARO 10 MG/0.5ML ~~LOC~~ SOAJ: 10.0000 mg | SUBCUTANEOUS | 0 refills | Status: DC

## 2023-07-25 NOTE — Telephone Encounter (Signed)
 Patient tolerating Mounjaro  7.5 mg weekly well. Will send in Mounjaro  10 mg weekly.

## 2023-07-26 DIAGNOSIS — M25512 Pain in left shoulder: Secondary | ICD-10-CM | POA: Diagnosis not present

## 2023-07-29 DIAGNOSIS — M25511 Pain in right shoulder: Secondary | ICD-10-CM | POA: Diagnosis not present

## 2023-07-29 DIAGNOSIS — M25611 Stiffness of right shoulder, not elsewhere classified: Secondary | ICD-10-CM | POA: Diagnosis not present

## 2023-07-30 ENCOUNTER — Encounter: Attending: Cardiology | Admitting: *Deleted

## 2023-07-30 DIAGNOSIS — I5022 Chronic systolic (congestive) heart failure: Secondary | ICD-10-CM | POA: Insufficient documentation

## 2023-07-30 NOTE — Progress Notes (Signed)
 Initial phone call completed. Diagnosis can be found in [facility] 6/20. EP Orientation scheduled for Wednesday 7/2 at 9:30.

## 2023-07-31 ENCOUNTER — Ambulatory Visit

## 2023-07-31 ENCOUNTER — Other Ambulatory Visit
Admission: RE | Admit: 2023-07-31 | Discharge: 2023-07-31 | Disposition: A | Discharge status: Home / Self Care | Attending: Cardiology | Admitting: Cardiology

## 2023-07-31 VITALS — Ht 68.0 in | Wt 227.8 lb

## 2023-07-31 DIAGNOSIS — I428 Other cardiomyopathies: Secondary | ICD-10-CM | POA: Diagnosis not present

## 2023-07-31 DIAGNOSIS — I5022 Chronic systolic (congestive) heart failure: Secondary | ICD-10-CM | POA: Diagnosis not present

## 2023-07-31 LAB — BRAIN NATRIURETIC PEPTIDE: B Natriuretic Peptide: 72.8 pg/mL (ref 0.0–100.0)

## 2023-07-31 LAB — BASIC METABOLIC PANEL WITH GFR
Anion gap: 13 (ref 5–15)
BUN: 14 mg/dL (ref 6–20)
CO2: 24 mmol/L (ref 22–32)
Calcium: 10 mg/dL (ref 8.9–10.3)
Chloride: 102 mmol/L (ref 98–111)
Creatinine, Ser: 0.88 mg/dL (ref 0.44–1.00)
GFR, Estimated: >60 mL/min (ref >60)
Glucose, Bld: 76 mg/dL (ref 70–99)
Potassium: 3.3 mmol/L — ABNORMAL LOW (ref 3.5–5.1)
Sodium: 139 mmol/L (ref 135–145)

## 2023-07-31 LAB — DIGOXIN LEVEL: Digoxin Level: 0.6 ng/mL — ABNORMAL LOW (ref 0.8–2.0)

## 2023-07-31 NOTE — Patient Instructions (Addendum)
 Patient Instructions  Patient Details  Name: Lisa Fuentes MRN: [id number] Date of Birth: December 27, 1960 Referring Provider:  Gardenia Led, DO  Below are your personal goals for exercise, nutrition, and risk factors. Our goal is to help you stay on track towards obtaining and maintaining these goals. We will be discussing your progress on these goals with you throughout the program.  Initial Exercise Prescription:  Initial Exercise Prescription - 07/31/23 1100       Date of Initial Exercise RX and Referring Provider   Date 07/31/23    Referring Provider Dr. Aditya Sabharwal, MD      Oxygen   Maintain Oxygen Saturation 88% or higher      Recumbant Bike   Level 1    RPM 50    Watts 15    Minutes 15    METs 2.16      NuStep   Level 2    SPM 80    Minutes 15    METs 2.16      Track   Laps 20    Minutes 15    METs 2.09      Prescription Details   Frequency (times per week) 3    Duration Progress to 30 minutes of continuous aerobic without signs/symptoms of physical distress      Intensity   THRR 40-80% of Max Heartrate 119-146    Ratings of Perceived Exertion 11-13    Perceived Dyspnea 0-4      Progression   Progression Continue to progress workloads to maintain intensity without signs/symptoms of physical distress.      Resistance Training   Training Prescription Yes    Weight 2 lb, 3 lb    Reps 10-15          Exercise Goals: Frequency: Be able to perform aerobic exercise two to three times per week in program working toward 2-5 days per week of home exercise.  Intensity: Work with a perceived exertion of 11 (fairly light) - 15 (hard) while following your exercise prescription.  We will make changes to your prescription with you as you progress through the program.   Duration: Be able to do 30 to 45 minutes of continuous aerobic exercise in addition to a 5 minute warm-up and a 5 minute cool-down routine.   Nutrition Goals: Your personal nutrition goals  will be established when you do your nutrition analysis with the dietician.  The following are general nutrition guidelines to follow: Cholesterol < 200mg /day Sodium < 1500mg /day Fiber: Women over 50 yrs - 21 grams per day  Personal Goals:  Personal Goals and Risk Factors at Admission - 07/30/23 1605       Core Components/Risk Factors/Patient Goals on Admission   Heart Failure Yes    Intervention Provide a combined exercise and nutrition program that is supplemented with education, support and counseling about heart failure. Directed toward relieving symptoms such as shortness of breath, decreased exercise tolerance, and extremity edema.    Expected Outcomes Improve functional capacity of life;Short term: Attendance in program 2-3 days a week with increased exercise capacity. Reported lower sodium intake. Reported increased fruit and vegetable intake. Reports medication compliance.;Short term: Daily weights obtained and reported for increase. Utilizing diuretic protocols set by physician.;Long term: Adoption of self-care skills and reduction of barriers for early signs and symptoms recognition and intervention leading to self-care maintenance.    Hypertension Yes    Intervention Provide education on lifestyle modifcations including regular physical activity/exercise, weight management, moderate sodium restriction  and increased consumption of fresh fruit, vegetables, and low fat dairy, alcohol moderation, and smoking cessation.;Monitor prescription use compliance.    Expected Outcomes Short Term: Continued assessment and intervention until BP is < 140/41mm HG in hypertensive participants. < 130/74mm HG in hypertensive participants with diabetes, heart failure or chronic kidney disease.;Long Term: Maintenance of blood pressure at goal levels.         Exercise Goals and Review:  Exercise Goals     Row Name 07/31/23 1128             Exercise Goals   Increase Physical Activity Yes        Intervention Provide advice, education, support and counseling about physical activity/exercise needs.;Develop an individualized exercise prescription for aerobic and resistive training based on initial evaluation findings, risk stratification, comorbidities and participant's personal goals.       Expected Outcomes Short Term: Attend rehab on a regular basis to increase amount of physical activity.;Long Term: Add in home exercise to make exercise part of routine and to increase amount of physical activity.;Long Term: Exercising regularly at least 3-5 days a week.       Increase Strength and Stamina Yes       Intervention Provide advice, education, support and counseling about physical activity/exercise needs.;Develop an individualized exercise prescription for aerobic and resistive training based on initial evaluation findings, risk stratification, comorbidities and participant's personal goals.       Expected Outcomes Short Term: Increase workloads from initial exercise prescription for resistance, speed, and METs.;Short Term: Perform resistance training exercises routinely during rehab and add in resistance training at home;Long Term: Improve cardiorespiratory fitness, muscular endurance and strength as measured by increased METs and functional capacity ( )       Able to understand and use rate of perceived exertion (RPE) scale Yes       Intervention Provide education and explanation on how to use RPE scale       Expected Outcomes Short Term: Able to use RPE daily in rehab to express subjective intensity level;Long Term:  Able to use RPE to guide intensity level when exercising independently       Able to understand and use Dyspnea scale Yes       Intervention Provide education and explanation on how to use Dyspnea scale       Expected Outcomes Short Term: Able to use Dyspnea scale daily in rehab to express subjective sense of shortness of breath during exertion;Long Term: Able to use Dyspnea scale to  guide intensity level when exercising independently       Knowledge and understanding of Target Heart Rate Range (THRR) Yes       Intervention Provide education and explanation of THRR including how the numbers were predicted and where they are located for reference       Expected Outcomes Short Term: Able to state/look up THRR;Long Term: Able to use THRR to govern intensity when exercising independently;Short Term: Able to use daily as guideline for intensity in rehab       Able to check pulse independently Yes       Intervention Provide education and demonstration on how to check pulse in carotid and radial arteries.;Review the importance of being able to check your own pulse for safety during independent exercise       Expected Outcomes Short Term: Able to explain why pulse checking is important during independent exercise;Long Term: Able to check pulse independently and accurately       Understanding  of Exercise Prescription Yes       Intervention Provide education, explanation, and written materials on patient's individual exercise prescription       Expected Outcomes Short Term: Able to explain program exercise prescription;Long Term: Able to explain home exercise prescription to exercise independently

## 2023-07-31 NOTE — Progress Notes (Signed)
 Cardiac Individual Treatment Plan  Patient Details  Name: Lisa Fuentes MRN: [id number] Date of Birth: 27-Nov-1960 Referring Provider:   Flowsheet Row Cardiac Rehab from 07/31/2023 in [facility] Cardiac and Pulmonary Rehab  Referring Provider Dr. Ria Commander, MD    Initial Encounter Date:  Flowsheet Row Cardiac Rehab from 07/31/2023 in [facility] Cardiac and Pulmonary Rehab  Date 07/31/23    Visit Diagnosis: Heart failure, chronic systolic (HCC)  Patient's Home Medications on Admission:  Current Outpatient Medications:    Acetaminophen  (TYLENOL  ARTHRITIS PAIN PO), Take 1,300 mg by mouth every 6 (six) hours as needed (Pain)., Disp: , Rfl:    apixaban  (ELIQUIS ) 5 MG TABS tablet, Take 5 mg by mouth 2 (two) times daily., Disp: , Rfl:    digoxin  (LANOXIN ) 0.125 MG tablet, Take 1 tablet (0.125 mg total) by mouth daily., Disp: 90 tablet, Rfl: 3   ENTRESTO  24-26 MG, Take 1 tablet by mouth 2 (two) times daily., Disp: , Rfl:    esomeprazole (NEXIUM) 40 MG capsule, Take 40 mg by mouth every morning. , Disp: , Rfl:    furosemide  (LASIX ) 20 MG tablet, Take one tablet (20mg ) by mouth Monday, Wednesday, Friday., Disp: 36 tablet, Rfl: 0   gabapentin  (NEURONTIN ) 100 MG capsule, Take 100 mg by mouth 3 (three) times daily., Disp: , Rfl:    levothyroxine  (SYNTHROID ) 125 MCG tablet, Take 125 mcg by mouth every morning., Disp: , Rfl:    lidocaine  (LIDODERM ) 5 %, Place 1 patch onto the skin daily. Remove & Discard patch within 12 hours or as directed by MD, Disp: 30 patch, Rfl: 0   Magnesium  250 MG TABS, Take 1 tablet by mouth daily., Disp: , Rfl:    metoprolol  succinate (TOPROL -XL) 50 MG 24 hr tablet, Take 1 tablet (50 mg total) by mouth daily. Take with or immediately following a meal., Disp: 90 tablet, Rfl: 3   oxyCODONE -acetaminophen  (PERCOCET/ROXICET) 5-325 MG tablet, Take 1 tablet by mouth every 4 (four) hours as needed for severe pain., Disp: , Rfl:    sertraline  (ZOLOFT ) 100 MG tablet, Take 200 mg by mouth  daily., Disp: , Rfl:    spironolactone  (ALDACTONE ) 25 MG tablet, Take 1 tablet (25 mg total) by mouth daily., Disp: 90 tablet, Rfl: 3   tirzepatide  (MOUNJARO ) 10 MG/0.5ML Pen, Inject 10 mg into the skin once a week., Disp: 2 mL, Rfl: 0  Past Medical History: Past Medical History:  Diagnosis Date   AICD (automatic cardioverter/defibrillator) present 2008   a.) Medtronic device placed 2008. b.) replaced 06/21/2013 (Medtronic Evera XT single-chamber ICD; model number [id number]).   Anemia    Anxiety    Aortic dilatation (HCC) 03/24/2019   a.) TTE 03/24/2019: Ao root measured 37 mm. b.) TTE 03/21/2020: Ao root measured 37 mm.   Arthritis    Depression    Difficult intubation    DOE (dyspnea on exertion)    Endometriosis of vagina 09/2013   Intra-operative findings of endometriosis implants on cervical stump   Essential hypertension    GERD (gastroesophageal reflux disease)    Headache    HFrEF (heart failure with reduced ejection fraction) (HCC)    a.) TTE 02/18/2014: EF 40-45%; tiv AR, mild MR/TR/PR; G1DD. b.) TTE 02/22/2017: EF 45-50%; mild LVH; triv PR; G1DD. c.) TTE 03/24/2019: EF 45-50%; glob HK, mild LVH; triv MR/TR/PR. d.) TTE 03/21/2020: EF 40-45%; glob HK.   History of 2019 novel coronavirus disease (COVID-19) 02/02/2020   History of 2019 novel coronavirus disease (COVID-19) 02/02/2020   Hypokalemia  Hypothyroidism    a.) s/p radioactive iodine  Tx; on levothyroxine    Mixed hyperlipidemia    Nonischemic cardiomyopathy (HCC)    a.) TTE 02/18/2014: EF 40-45%. b.) TTE 02/22/2017: EF 45-50%. c.) TTE 03/24/2019: EF 45-50%. d.) TTE 03/21/2020: EF 40-45%   OSA on CPAP    Panic attacks    PSVT (paroxysmal supraventricular tachycardia) (HCC) 03/30/2020   a.) Holter 03/30/2020 --> 2 runs; fastest lasting 4 beats (184 bpm); longest lasting 4 beats (113 bpm)   Pulmonary embolism (HCC)    Tachycardia     Tobacco Use: Social History   Tobacco Use  Smoking Status Never  Smokeless  Tobacco Never    Labs: Review Flowsheet       08/06/2007  Labs for ITP Cardiac and Pulmonary Rehab  TCO2 30      Exercise Target Goals: Exercise Program Goal: Individual exercise prescription set using results from initial 6 min walk test and THRR while considering  patient's activity barriers and safety.   Exercise Prescription Goal: Initial exercise prescription builds to 30-45 minutes a day of aerobic activity, 2-3 days per week.  Home exercise guidelines will be given to patient during program as part of exercise prescription that the participant will acknowledge.   Education: Aerobic Exercise: - Group verbal and visual presentation on the components of exercise prescription. Introduces F.I.T.T principle from ACSM for exercise prescriptions.  Reviews F.I.T.T. principles of aerobic exercise including progression. Written material given at graduation.   Education: Resistance Exercise: - Group verbal and visual presentation on the components of exercise prescription. Introduces F.I.T.T principle from ACSM for exercise prescriptions  Reviews F.I.T.T. principles of resistance exercise including progression. Written material given at graduation.    Education: Exercise & Equipment Safety: - Individual verbal instruction and demonstration of equipment use and safety with use of the equipment. Flowsheet Row Cardiac Rehab from 07/31/2023 in [facility] Cardiac and Pulmonary Rehab  Date 07/31/23  Educator NT  Instruction Review Code 1- Verbalizes Understanding    Education: Exercise Physiology & General Exercise Guidelines: - Group verbal and written instruction with models to review the exercise physiology of the cardiovascular system and associated critical values. Provides general exercise guidelines with specific guidelines to those with heart or lung disease.    Education: Flexibility, Balance, Mind/Body Relaxation: - Group verbal and visual presentation with interactive activity on the  components of exercise prescription. Introduces F.I.T.T principle from ACSM for exercise prescriptions. Reviews F.I.T.T. principles of flexibility and balance exercise training including progression. Also discusses the mind body connection.  Reviews various relaxation techniques to help reduce and manage stress (i.e. Deep breathing, progressive muscle relaxation, and visualization). Balance handout provided to take home. Written material given at graduation.   Activity Barriers & Risk Stratification:  Activity Barriers & Cardiac Risk Stratification - 07/30/23 1541       Activity Barriers & Cardiac Risk Stratification   Activity Barriers Back Problems;Joint Problems;Balance Concerns;History of Falls   rotator cuff- injured   Cardiac Risk Stratification High          6 Minute Walk:  6 Minute Walk     Row Name 07/31/23 1122         6 Minute Walk   Phase Initial     Distance 780 feet     Walk Time 5.5 minutes     # of Rest Breaks 1     MPH 1.61     METS 2.16     RPE 15     Perceived Dyspnea  2     VO2 Peak 7.56     Symptoms Yes (comment)     Comments fatigue     Resting HR 93 bpm     Resting BP 98/58     Resting Oxygen Saturation  97 %     Exercise Oxygen Saturation  during 6 min walk 97 %     Max Ex. HR 111 bpm     Max Ex. BP 122/66     2 Minute Post BP 102/60        Oxygen Initial Assessment:   Oxygen Re-Evaluation:   Oxygen Discharge (Final Oxygen Re-Evaluation):   Initial Exercise Prescription:  Initial Exercise Prescription - 07/31/23 1100       Date of Initial Exercise RX and Referring Provider   Date 07/31/23    Referring Provider Dr. Aditya Sabharwal, MD      Oxygen   Maintain Oxygen Saturation 88% or higher      Recumbant Bike   Level 1    RPM 50    Watts 15    Minutes 15    METs 2.16      NuStep   Level 2    SPM 80    Minutes 15    METs 2.16      Track   Laps 20    Minutes 15    METs 2.09      Prescription Details   Frequency  (times per week) 3    Duration Progress to 30 minutes of continuous aerobic without signs/symptoms of physical distress      Intensity   THRR 40-80% of Max Heartrate 119-146    Ratings of Perceived Exertion 11-13    Perceived Dyspnea 0-4      Progression   Progression Continue to progress workloads to maintain intensity without signs/symptoms of physical distress.      Resistance Training   Training Prescription Yes    Weight 2 lb, 3 lb    Reps 10-15          Perform Capillary Blood Glucose checks as needed.  Exercise Prescription Changes:   Exercise Prescription Changes     Row Name 07/31/23 1100             Response to Exercise   Blood Pressure (Admit) 98/58       Blood Pressure (Exercise) 122/66       Blood Pressure (Exit) 102/60       Heart Rate (Admit) 93 bpm       Heart Rate (Exercise) 111 bpm       Heart Rate (Exit) 88 bpm       Oxygen Saturation (Admit) 97 %       Oxygen Saturation (Exercise) 97 %       Rating of Perceived Exertion (Exercise) 15       Perceived Dyspnea (Exercise) 2       Symptoms fatigue       Comments Results          Exercise Comments:   Exercise Goals and Review:   Exercise Goals     Row Name 07/31/23 1128             Exercise Goals   Increase Physical Activity Yes       Intervention Provide advice, education, support and counseling about physical activity/exercise needs.;Develop an individualized exercise prescription for aerobic and resistive training based on initial evaluation findings, risk stratification, comorbidities and participant's personal goals.  Expected Outcomes Short Term: Attend rehab on a regular basis to increase amount of physical activity.;Long Term: Add in home exercise to make exercise part of routine and to increase amount of physical activity.;Long Term: Exercising regularly at least 3-5 days a week.       Increase Strength and Stamina Yes       Intervention Provide advice, education,  support and counseling about physical activity/exercise needs.;Develop an individualized exercise prescription for aerobic and resistive training based on initial evaluation findings, risk stratification, comorbidities and participant's personal goals.       Expected Outcomes Short Term: Increase workloads from initial exercise prescription for resistance, speed, and METs.;Short Term: Perform resistance training exercises routinely during rehab and add in resistance training at home;Long Term: Improve cardiorespiratory fitness, muscular endurance and strength as measured by increased METs and functional capacity ( )       Able to understand and use rate of perceived exertion (RPE) scale Yes       Intervention Provide education and explanation on how to use RPE scale       Expected Outcomes Short Term: Able to use RPE daily in rehab to express subjective intensity level;Long Term:  Able to use RPE to guide intensity level when exercising independently       Able to understand and use Dyspnea scale Yes       Intervention Provide education and explanation on how to use Dyspnea scale       Expected Outcomes Short Term: Able to use Dyspnea scale daily in rehab to express subjective sense of shortness of breath during exertion;Long Term: Able to use Dyspnea scale to guide intensity level when exercising independently       Knowledge and understanding of Target Heart Rate Range (THRR) Yes       Intervention Provide education and explanation of THRR including how the numbers were predicted and where they are located for reference       Expected Outcomes Short Term: Able to state/look up THRR;Long Term: Able to use THRR to govern intensity when exercising independently;Short Term: Able to use daily as guideline for intensity in rehab       Able to check pulse independently Yes       Intervention Provide education and demonstration on how to check pulse in carotid and radial arteries.;Review the importance of  being able to check your own pulse for safety during independent exercise       Expected Outcomes Short Term: Able to explain why pulse checking is important during independent exercise;Long Term: Able to check pulse independently and accurately       Understanding of Exercise Prescription Yes       Intervention Provide education, explanation, and written materials on patient's individual exercise prescription       Expected Outcomes Short Term: Able to explain program exercise prescription;Long Term: Able to explain home exercise prescription to exercise independently          Exercise Goals Re-Evaluation :   Discharge Exercise Prescription (Final Exercise Prescription Changes):  Exercise Prescription Changes - 07/31/23 1100       Response to Exercise   Blood Pressure (Admit) 98/58    Blood Pressure (Exercise) 122/66    Blood Pressure (Exit) 102/60    Heart Rate (Admit) 93 bpm    Heart Rate (Exercise) 111 bpm    Heart Rate (Exit) 88 bpm    Oxygen Saturation (Admit) 97 %    Oxygen Saturation (Exercise) 97 %  Rating of Perceived Exertion (Exercise) 15    Perceived Dyspnea (Exercise) 2    Symptoms fatigue    Comments Results          Nutrition:  Target Goals: Understanding of nutrition guidelines, daily intake of sodium 1500mg , cholesterol 200mg , calories 30% from fat and 7% or less from saturated fats, daily to have 5 or more servings of fruits and vegetables.  Education: All About Nutrition: -Group instruction provided by verbal, written material, interactive activities, discussions, models, and posters to present general guidelines for heart healthy nutrition including fat, fiber, MyPlate, the role of sodium in heart healthy nutrition, utilization of the nutrition label, and utilization of this knowledge for meal planning. Follow up email sent as well. Written material given at graduation.   Biometrics:  Pre Biometrics - 07/31/23 1129       Pre Biometrics    Height 5' 8 (1.727 m)    Weight 227 lb 12.8 oz (103.3 kg)    Waist Circumference 43 inches    Hip Circumference 46 inches    Waist to Hip Ratio 0.93 %    BMI (Calculated) 34.64    Single Leg Stand 2.9 seconds           Nutrition Therapy Plan and Nutrition Goals:  Nutrition Therapy & Goals - 07/31/23 1121       Nutrition Therapy   RD appointment deferred Yes      Intervention Plan   Intervention Prescribe, educate and counsel regarding individualized specific dietary modifications aiming towards targeted core components such as weight, hypertension, lipid management, diabetes, heart failure and other comorbidities.    Expected Outcomes Short Term Goal: Understand basic principles of dietary content, such as calories, fat, sodium, cholesterol and nutrients.;Short Term Goal: A plan has been developed with personal nutrition goals set during dietitian appointment.;Long Term Goal: Adherence to prescribed nutrition plan.          Nutrition Assessments:  MEDIFICTS Score Key: >=70 Need to make dietary changes  40-70 Heart Healthy Diet <= 40 Therapeutic Level Cholesterol Diet   Picture Your Plate Scores: <59 Unhealthy dietary pattern with much room for improvement. 41-50 Dietary pattern unlikely to meet recommendations for good health and room for improvement. 51-60 More healthful dietary pattern, with some room for improvement.  >60 Healthy dietary pattern, although there may be some specific behaviors that could be improved.    Nutrition Goals Re-Evaluation:   Nutrition Goals Discharge (Final Nutrition Goals Re-Evaluation):   Psychosocial: Target Goals: Acknowledge presence or absence of significant depression and/or stress, maximize coping skills, provide positive support system. Participant is able to verbalize types and ability to use techniques and skills needed for reducing stress and depression.   Education: Stress, Anxiety, and Depression - Group verbal and  visual presentation to define topics covered.  Reviews how body is impacted by stress, anxiety, and depression.  Also discusses healthy ways to reduce stress and to treat/manage anxiety and depression.  Written material given at graduation.   Education: Sleep Hygiene -Provides group verbal and written instruction about how sleep can affect your health.  Define sleep hygiene, discuss sleep cycles and impact of sleep habits. Review good sleep hygiene tips.    Initial Review & Psychosocial Screening:  Initial Psych Review & Screening - 07/30/23 1545       Initial Review   Current issues with Current Depression;History of Depression;Current Stress Concerns;Current Psychotropic Meds    Source of Stress Concerns Family;Unable to participate in former interests or  hobbies;Unable to perform yard/household activities      Family Dynamics   Good Support System? Yes   family     Barriers   Psychosocial barriers to participate in program There are no identifiable barriers or psychosocial needs.;The patient should benefit from training in stress management and relaxation.      Screening Interventions   Interventions Encouraged to exercise;To provide support and resources with identified psychosocial needs;Provide feedback about the scores to participant;Program counselor consult    Expected Outcomes Short Term goal: Utilizing psychosocial counselor, staff and physician to assist with identification of specific Stressors or current issues interfering with healing process. Setting desired goal for each stressor or current issue identified.;Long Term Goal: Stressors or current issues are controlled or eliminated.;Short Term goal: Identification and review with participant of any Quality of Life or Depression concerns found by scoring the questionnaire.;Long Term goal: The participant improves quality of Life and PHQ9 Scores as seen by post scores and/or verbalization of changes          Quality of Life  Scores:   Scores of 19 and below usually indicate a poorer quality of life in these areas.  A difference of  2-3 points is a clinically meaningful difference.  A difference of 2-3 points in the total score of the Quality of Life Index has been associated with significant improvement in overall quality of life, self-image, physical symptoms, and general health in studies assessing change in quality of life.  PHQ-9: Review Flowsheet       07/31/2023 11/02/2022  Depression screen PHQ 2/9  Decreased Interest 1 0  Down, Depressed, Hopeless 1 0  PHQ - 2 Score 2 0  Altered sleeping 3 -  Tired, decreased energy 3 -  Change in appetite 0 -  Feeling bad or failure about yourself  0 -  Trouble concentrating 1 -  Moving slowly or fidgety/restless 0 -  Suicidal thoughts 0 -  PHQ-9 Score 9 -  Difficult doing work/chores Somewhat difficult -   Interpretation of Total Score  Total Score Depression Severity:  1-4 = Minimal depression, 5-9 = Mild depression, 10-14 = Moderate depression, 15-19 = Moderately severe depression, 20-27 = Severe depression   Psychosocial Evaluation and Intervention:  Psychosocial Evaluation - 07/30/23 1557       Psychosocial Evaluation & Interventions   Interventions Encouraged to exercise with the program and follow exercise prescription;Stress management education;Relaxation education    Comments Ms. Seider is coming to cardiac rehab with systolic HF. She states she has been in poor health for a while now, unable to do do the things she once did without feeling short of breath or very tired. She notes she has become weaker and is dealing with rotator cuff issues after her last fall. She is going to PT for her shoulder and wants to start this program to see if she can increase her stamina. She is on medication for depression and notes that she has had multiple deaths in the family over the recent years, including her son, that have worsened her depression. Her medication  helps some but some days when it gets too bad, she lays in the bed. 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    Expected Outcomes Short: attend cardiac rehab for education and exercise  Long: develop and maintain positive self care habits.    Continue Psychosocial Services  Follow up required by staff          Psychosocial Re-Evaluation:   Psychosocial Discharge (Final Psychosocial Re-Evaluation):   Vocational Rehabilitation: Provide vocational rehab assistance to qualifying candidates.   Vocational Rehab Evaluation & Intervention:  Vocational Rehab - 07/30/23 1545       Initial Vocational Rehab Evaluation & Intervention   Assessment shows need for Vocational Rehabilitation No          Education: Education Goals: Education classes will be provided on a variety of topics geared toward better understanding of heart health and risk factor modification. Participant will state understanding/return demonstration of topics presented as noted by education test scores.  Learning Barriers/Preferences:  Learning Barriers/Preferences - 07/30/23 1544       Learning Barriers/Preferences   Learning Barriers None    Learning Preferences None          General Cardiac Education Topics:  AED/CPR: - Group verbal and written instruction with the use of models to demonstrate the basic use of the AED with the basic ABC's of resuscitation.   Anatomy and Cardiac Procedures: - Group verbal and visual presentation and models provide information about basic cardiac anatomy and function. Reviews the testing methods done to diagnose heart disease and the outcomes of the test results. Describes the treatment choices: Medical Management, Angioplasty, or Coronary Bypass Surgery for treating various heart conditions including Myocardial Infarction, Angina, Valve Disease, and Cardiac Arrhythmias.   Written material given at graduation.   Medication Safety: - Group verbal and visual instruction to review commonly prescribed medications for heart and lung disease. Reviews the medication, class of the drug, and side effects. Includes the steps to properly store meds and maintain the prescription regimen.  Written material given at graduation.   Intimacy: - Group verbal instruction through game format to discuss how heart and lung disease can affect sexual intimacy. Written material given at graduation..   Know Your Numbers and Heart Failure: - Group verbal and visual instruction to discuss disease risk factors for cardiac and pulmonary disease and treatment options.  Reviews associated critical values for Overweight/Obesity, Hypertension, Cholesterol, and Diabetes.  Discusses basics of heart failure: signs/symptoms and treatments.  Introduces Heart Failure Zone chart for action plan for heart failure.  Written material given at graduation.   Infection Prevention: - Provides verbal and written material to individual with discussion of infection control including proper hand washing and proper equipment cleaning during exercise session. Flowsheet Row Cardiac Rehab from 07/31/2023 in [facility] Cardiac and Pulmonary Rehab  Date 07/31/23  Educator NT  Instruction Review Code 1- Verbalizes Understanding    Falls Prevention: - Provides verbal and written material to individual with discussion of falls prevention and safety. Flowsheet Row Cardiac Rehab from 07/31/2023 in [facility] Cardiac and Pulmonary Rehab  Date 07/31/23  Educator NT  Instruction Review Code 1- Verbalizes Understanding    Other: -Provides group and verbal instruction on various topics (see comments)   Knowledge Questionnaire Score:   Core Components/Risk Factors/Patient Goals at Admission:  Personal Goals and Risk Factors at Admission - 07/30/23 1605       Core Components/Risk Factors/Patient Goals on Admission   Heart  Failure Yes    Intervention Provide a combined exercise and nutrition program that is supplemented with education, support and counseling about heart failure. Directed toward relieving symptoms such as shortness of breath, decreased exercise tolerance, and extremity edema.    Expected Outcomes Improve functional capacity of life;Short term: Attendance in program 2-3 days a  week with increased exercise capacity. Reported lower sodium intake. Reported increased fruit and vegetable intake. Reports medication compliance.;Short term: Daily weights obtained and reported for increase. Utilizing diuretic protocols set by physician.;Long term: Adoption of self-care skills and reduction of barriers for early signs and symptoms recognition and intervention leading to self-care maintenance.    Hypertension Yes    Intervention Provide education on lifestyle modifcations including regular physical activity/exercise, weight management, moderate sodium restriction and increased consumption of fresh fruit, vegetables, and low fat dairy, alcohol moderation, and smoking cessation.;Monitor prescription use compliance.    Expected Outcomes Short Term: Continued assessment and intervention until BP is < 140/4mm HG in hypertensive participants. < 130/37mm HG in hypertensive participants with diabetes, heart failure or chronic kidney disease.;Long Term: Maintenance of blood pressure at goal levels.          Education:Diabetes - Individual verbal and written instruction to review signs/symptoms of diabetes, desired ranges of glucose level fasting, after meals and with exercise. Acknowledge that pre and post exercise glucose checks will be done for 3 sessions at entry of program.   Core Components/Risk Factors/Patient Goals Review:    Core Components/Risk Factors/Patient Goals at Discharge (Final Review):    ITP Comments:  ITP Comments     Row Name 07/30/23 1605 07/31/23 1120         ITP Comments Initial phone  call completed. Diagnosis can be found in [facility] 6/20. EP Orientation scheduled for Wednesday 7/2 at 9:30. Completed and gym orientation for cardiac rehab. Initial ITP created and sent for review to Dr. Oneil Pinal, Medical Director.         Comments: Initial ITP

## 2023-08-01 DIAGNOSIS — I509 Heart failure, unspecified: Secondary | ICD-10-CM | POA: Diagnosis not present

## 2023-08-01 DIAGNOSIS — R1013 Epigastric pain: Secondary | ICD-10-CM | POA: Diagnosis not present

## 2023-08-05 ENCOUNTER — Encounter: Admitting: *Deleted

## 2023-08-05 DIAGNOSIS — M25511 Pain in right shoulder: Secondary | ICD-10-CM | POA: Diagnosis not present

## 2023-08-05 DIAGNOSIS — I5022 Chronic systolic (congestive) heart failure: Secondary | ICD-10-CM | POA: Diagnosis not present

## 2023-08-05 DIAGNOSIS — M25611 Stiffness of right shoulder, not elsewhere classified: Secondary | ICD-10-CM | POA: Diagnosis not present

## 2023-08-05 NOTE — Progress Notes (Signed)
 Daily Session Note  Patient Details  Name: Lisa Fuentes MRN: [id number] Date of Birth: Mar 14, 1960 Referring Provider:   Flowsheet Row Cardiac Rehab from 07/31/2023 in [facility] Cardiac and Pulmonary Rehab  Referring Provider Dr. Ria Commander, MD    Encounter Date: 08/05/2023  Check In:  Session Check In - 08/05/23 1354       Check-In   Supervising physician immediately available to respond to emergencies See telemetry face sheet for immediately available ER MD    Location [facility]-Cardiac & Pulmonary Rehab    Staff Present Hoy Rodney RN,BSN;Laureen Delores, BS, RRT, CPFT;Maxon Conetta BS, Exercise Physiologist;Kelly [facility]    Virtual Visit No    Medication changes reported     No    Warm-up and Cool-down Performed on first and last piece of equipment    Resistance Training Performed Yes    VAD Patient? No    PAD/SET Patient? No      Pain Assessment   Currently in Pain? No/denies             Social History   Tobacco Use  Smoking Status Never  Smokeless Tobacco Never    Goals Met:  Independence with exercise equipment Exercise tolerated well No report of concerns or symptoms today Strength training completed today  Goals Unmet:  Not Applicable  Comments: First full day of exercise!  Patient was oriented to gym and equipment including functions, settings, policies, and procedures.  Patient's individual exercise prescription and treatment plan were reviewed.  All starting workloads were established based on the results of the 6 minute walk test done at initial orientation visit.  The plan for exercise progression was also introduced and progression will be customized based on patient's performance and goals.     Dr. Oneil Pinal is Medical Director for [facility] Cardiac Rehabilitation.  Dr. Fuad Aleskerov is Medical Director for [facility] Pulmonary Rehabilitation.

## 2023-08-07 ENCOUNTER — Encounter

## 2023-08-08 ENCOUNTER — Encounter: Admitting: *Deleted

## 2023-08-08 ENCOUNTER — Encounter

## 2023-08-08 DIAGNOSIS — I5022 Chronic systolic (congestive) heart failure: Secondary | ICD-10-CM | POA: Diagnosis not present

## 2023-08-08 DIAGNOSIS — M47817 Spondylosis without myelopathy or radiculopathy, lumbosacral region: Secondary | ICD-10-CM | POA: Diagnosis not present

## 2023-08-08 DIAGNOSIS — Z79899 Other long term (current) drug therapy: Secondary | ICD-10-CM | POA: Diagnosis not present

## 2023-08-08 DIAGNOSIS — G894 Chronic pain syndrome: Secondary | ICD-10-CM | POA: Diagnosis not present

## 2023-08-08 DIAGNOSIS — R202 Paresthesia of skin: Secondary | ICD-10-CM | POA: Diagnosis not present

## 2023-08-08 DIAGNOSIS — M533 Sacrococcygeal disorders, not elsewhere classified: Secondary | ICD-10-CM | POA: Diagnosis not present

## 2023-08-08 NOTE — Progress Notes (Signed)
 Daily Session Note  Patient Details  Name: BROOKELYNNE DIMPERIO MRN: [id number] Date of Birth: Jul 29, 1960 Referring Provider:   Flowsheet Row Cardiac Rehab from 07/31/2023 in [facility] Cardiac and Pulmonary Rehab  Referring Provider Dr. Ria Commander, MD    Encounter Date: 08/08/2023  Check In:  Session Check In - 08/08/23 1718       Check-In   Supervising physician immediately available to respond to emergencies See telemetry face sheet for immediately available ER MD    Staff Present Hoy Rodney RN,BSN;Joseph Rolinda RCP,RRT,BSRT;Kristen Coble RN,BC,MSN    Virtual Visit No    Medication changes reported     No    Fall or balance concerns reported    No    Warm-up and Cool-down Performed on first and last piece of equipment    Resistance Training Performed Yes    VAD Patient? No    PAD/SET Patient? No      Pain Assessment   Currently in Pain? No/denies             Social History   Tobacco Use  Smoking Status Never  Smokeless Tobacco Never    Goals Met:  Independence with exercise equipment Exercise tolerated well No report of concerns or symptoms today Strength training completed today  Goals Unmet:  Not Applicable  Comments: Pt able to follow exercise prescription today without complaint.  Will continue to monitor for progression.    Dr. Oneil Pinal is Medical Director for [facility] Cardiac Rehabilitation.  Dr. Fuad Aleskerov is Medical Director for [facility] Pulmonary Rehabilitation.

## 2023-08-09 DIAGNOSIS — M25611 Stiffness of right shoulder, not elsewhere classified: Secondary | ICD-10-CM | POA: Diagnosis not present

## 2023-08-09 DIAGNOSIS — M25511 Pain in right shoulder: Secondary | ICD-10-CM | POA: Diagnosis not present

## 2023-08-12 ENCOUNTER — Encounter

## 2023-08-12 DIAGNOSIS — M25611 Stiffness of right shoulder, not elsewhere classified: Secondary | ICD-10-CM | POA: Diagnosis not present

## 2023-08-12 DIAGNOSIS — M25511 Pain in right shoulder: Secondary | ICD-10-CM | POA: Diagnosis not present

## 2023-08-13 NOTE — Progress Notes (Signed)
 Remote ICD transmission.

## 2023-08-13 NOTE — Addendum Note (Signed)
 Addended by: VICCI SELLER A on: 08/13/2023 09:51 AM   Modules accepted: Orders

## 2023-08-14 ENCOUNTER — Encounter

## 2023-08-14 DIAGNOSIS — I5022 Chronic systolic (congestive) heart failure: Secondary | ICD-10-CM

## 2023-08-14 NOTE — Progress Notes (Signed)
 Daily Session Note  Patient Details  Name: Lisa Fuentes MRN: [id number] Date of Birth: 1960/08/29 Referring Provider:   Flowsheet Row Cardiac Rehab from 07/31/2023 in [facility] Cardiac and Pulmonary Rehab  Referring Provider Dr. Ria Commander, MD    Encounter Date: 08/14/2023  Check In:  Session Check In - 08/14/23 1704       Check-In   Supervising physician immediately available to respond to emergencies See telemetry face sheet for immediately available ER MD    Location [facility]-Cardiac & Pulmonary Rehab    Staff Present Burnard Davenport RN,BSN,MPA;Joseph [facility] Dyane BS, ACSM CEP, Exercise Physiologist    Virtual Visit No    Medication changes reported     No    Fall or balance concerns reported    No    Warm-up and Cool-down Performed on first and last piece of equipment    Resistance Training Performed Yes    VAD Patient? No    PAD/SET Patient? No      Pain Assessment   Currently in Pain? No/denies             Social History   Tobacco Use  Smoking Status Never  Smokeless Tobacco Never    Goals Met:  Independence with exercise equipment Exercise tolerated well No report of concerns or symptoms today Strength training completed today  Goals Unmet:  Not Applicable  Comments: Pt able to follow exercise prescription today without complaint.  Will continue to monitor for progression.    Dr. Oneil Pinal is Medical Director for [facility] Cardiac Rehabilitation.  Dr. Fuad Aleskerov is Medical Director for [facility] Pulmonary Rehabilitation.

## 2023-08-15 ENCOUNTER — Encounter: Admitting: *Deleted

## 2023-08-15 ENCOUNTER — Encounter

## 2023-08-15 ENCOUNTER — Telehealth: Payer: Self-pay | Admitting: Pharmacist

## 2023-08-15 DIAGNOSIS — I5022 Chronic systolic (congestive) heart failure: Secondary | ICD-10-CM | POA: Diagnosis not present

## 2023-08-15 MED ORDER — MOUNJARO 5 MG/0.5ML ~~LOC~~ SOAJ: 5.0000 mg | SUBCUTANEOUS | 1 refills | Status: DC

## 2023-08-15 NOTE — Progress Notes (Signed)
 Daily Session Note  Patient Details  Name: Lisa Fuentes MRN: [id number] Date of Birth: [birth year] Referring Provider:   Flowsheet Row Cardiac Rehab from 07/31/2023 in [facility] Cardiac and Pulmonary Rehab  Referring Provider Dr. Ria Commander, MD    Encounter Date: 08/15/2023  Check In:  Session Check In - 08/15/23 1707       Check-In   Supervising physician immediately available to respond to emergencies See telemetry face sheet for immediately available ER MD    Location [facility]-Cardiac & Pulmonary Rehab    Staff Present Hoy Rodney RN,BSN;Joseph [facility] BS, Exercise Physiologist;Kristen Coble RN,BC,MSN    Virtual Visit No    Medication changes reported     No    Fall or balance concerns reported    No    Warm-up and Cool-down Performed on first and last piece of equipment    Resistance Training Performed Yes    VAD Patient? No    PAD/SET Patient? No      Pain Assessment   Currently in Pain? No/denies             Social History   Tobacco Use  Smoking Status Never  Smokeless Tobacco Never    Goals Met:  Independence with exercise equipment Exercise tolerated well No report of concerns or symptoms today Strength training completed today  Goals Unmet:  Not Applicable  Comments: Pt able to follow exercise prescription today without complaint.  Will continue to monitor for progression.    Dr. Oneil Pinal is Medical Director for [facility] Cardiac Rehabilitation.  Dr. Fuad Aleskerov is Medical Director for [facility] Pulmonary Rehabilitation.

## 2023-08-15 NOTE — Telephone Encounter (Signed)
 Patient reports indigestion and burping on the 10 mg dose of Mounjaro , so her PCP decreased again to 5 mg weekly. Non-pharmacologic interventions to prevent this given. Continue Mounjaro  5 mg weekly.

## 2023-08-16 ENCOUNTER — Ambulatory Visit (INDEPENDENT_AMBULATORY_CARE_PROVIDER_SITE_OTHER): Admitting: Nurse Practitioner

## 2023-08-16 ENCOUNTER — Encounter (INDEPENDENT_AMBULATORY_CARE_PROVIDER_SITE_OTHER): Payer: Self-pay | Admitting: Nurse Practitioner

## 2023-08-16 VITALS — BP 103/71 | HR 87 | Resp 18 | Wt 223.6 lb

## 2023-08-16 DIAGNOSIS — I8312 Varicose veins of left lower extremity with inflammation: Secondary | ICD-10-CM

## 2023-08-18 ENCOUNTER — Encounter (INDEPENDENT_AMBULATORY_CARE_PROVIDER_SITE_OTHER): Payer: Self-pay | Admitting: Nurse Practitioner

## 2023-08-18 NOTE — Progress Notes (Signed)
Varicose veins of left lower extremity with inflammation (454.1  I83.10) Current Plans   Indication: Patient presents with symptomatic varicose veins of the left lower extremity.   Procedure: Sclerotherapy using hypertonic saline mixed with 1% Lidocaine was performed on the left lower extremity. Compression wraps were placed. The patient tolerated the procedure well. 

## 2023-08-19 ENCOUNTER — Encounter

## 2023-08-21 ENCOUNTER — Encounter

## 2023-08-22 ENCOUNTER — Encounter

## 2023-08-26 ENCOUNTER — Encounter

## 2023-08-27 ENCOUNTER — Other Ambulatory Visit: Payer: Self-pay

## 2023-08-27 ENCOUNTER — Emergency Department

## 2023-08-27 ENCOUNTER — Observation Stay
Admission: EM | Admit: 2023-08-27 | Discharge: 2023-08-30 | Disposition: A | Discharge status: Home / Self Care | Attending: Hospitalist | Admitting: Hospitalist

## 2023-08-27 DIAGNOSIS — K219 Gastro-esophageal reflux disease without esophagitis: Secondary | ICD-10-CM | POA: Insufficient documentation

## 2023-08-27 DIAGNOSIS — E119 Type 2 diabetes mellitus without complications: Secondary | ICD-10-CM | POA: Insufficient documentation

## 2023-08-27 DIAGNOSIS — I428 Other cardiomyopathies: Secondary | ICD-10-CM | POA: Insufficient documentation

## 2023-08-27 DIAGNOSIS — Z7901 Long term (current) use of anticoagulants: Secondary | ICD-10-CM | POA: Insufficient documentation

## 2023-08-27 DIAGNOSIS — F1120 Opioid dependence, uncomplicated: Secondary | ICD-10-CM | POA: Diagnosis not present

## 2023-08-27 DIAGNOSIS — I959 Hypotension, unspecified: Secondary | ICD-10-CM | POA: Diagnosis not present

## 2023-08-27 DIAGNOSIS — Z86718 Personal history of other venous thrombosis and embolism: Secondary | ICD-10-CM | POA: Insufficient documentation

## 2023-08-27 DIAGNOSIS — N179 Acute kidney failure, unspecified: Secondary | ICD-10-CM | POA: Diagnosis not present

## 2023-08-27 DIAGNOSIS — F32A Depression, unspecified: Secondary | ICD-10-CM | POA: Diagnosis not present

## 2023-08-27 DIAGNOSIS — G8929 Other chronic pain: Secondary | ICD-10-CM | POA: Insufficient documentation

## 2023-08-27 DIAGNOSIS — R5383 Other fatigue: Secondary | ICD-10-CM

## 2023-08-27 DIAGNOSIS — I110 Hypertensive heart disease with heart failure: Secondary | ICD-10-CM | POA: Insufficient documentation

## 2023-08-27 DIAGNOSIS — Z95810 Presence of automatic (implantable) cardiac defibrillator: Secondary | ICD-10-CM | POA: Insufficient documentation

## 2023-08-27 DIAGNOSIS — Z8616 Personal history of COVID-19: Secondary | ICD-10-CM | POA: Diagnosis not present

## 2023-08-27 DIAGNOSIS — E039 Hypothyroidism, unspecified: Secondary | ICD-10-CM | POA: Diagnosis not present

## 2023-08-27 DIAGNOSIS — R531 Weakness: Secondary | ICD-10-CM

## 2023-08-27 DIAGNOSIS — I5043 Acute on chronic combined systolic (congestive) and diastolic (congestive) heart failure: Secondary | ICD-10-CM | POA: Diagnosis not present

## 2023-08-27 DIAGNOSIS — E876 Hypokalemia: Secondary | ICD-10-CM | POA: Diagnosis not present

## 2023-08-27 DIAGNOSIS — R0602 Shortness of breath: Secondary | ICD-10-CM | POA: Diagnosis not present

## 2023-08-27 LAB — BASIC METABOLIC PANEL WITH GFR
Anion gap: 17 — ABNORMAL HIGH (ref 5–15)
Anion gap: 14 (ref 5–15)
BUN: 24 mg/dL — ABNORMAL HIGH (ref 6–20)
BUN: 23 mg/dL — ABNORMAL HIGH (ref 6–20)
CO2: 25 mmol/L (ref 22–32)
CO2: 28 mmol/L (ref 22–32)
Calcium: 10.5 mg/dL — ABNORMAL HIGH (ref 8.9–10.3)
Calcium: 9.8 mg/dL (ref 8.9–10.3)
Chloride: 95 mmol/L — ABNORMAL LOW (ref 98–111)
Chloride: 94 mmol/L — ABNORMAL LOW (ref 98–111)
Creatinine, Ser: 1.32 mg/dL — ABNORMAL HIGH (ref 0.44–1.00)
Creatinine, Ser: 1.13 mg/dL — ABNORMAL HIGH (ref 0.44–1.00)
GFR, Estimated: 46 mL/min — ABNORMAL LOW (ref >60)
GFR, Estimated: 56 mL/min — ABNORMAL LOW (ref >60)
Glucose, Bld: 103 mg/dL — ABNORMAL HIGH (ref 70–99)
Glucose, Bld: 101 mg/dL — ABNORMAL HIGH (ref 70–99)
Potassium: 3.2 mmol/L — ABNORMAL LOW (ref 3.5–5.1)
Potassium: 3 mmol/L — ABNORMAL LOW (ref 3.5–5.1)
Sodium: 137 mmol/L (ref 135–145)
Sodium: 136 mmol/L (ref 135–145)

## 2023-08-27 LAB — CBC
HCT: 47.8 % — ABNORMAL HIGH (ref 36.0–46.0)
Hemoglobin: 15.8 g/dL — ABNORMAL HIGH (ref 12.0–15.0)
MCH: 30.4 pg (ref 26.0–34.0)
MCHC: 33.1 g/dL (ref 30.0–36.0)
MCV: 91.9 fL (ref 80.0–100.0)
Platelets: 207 K/uL (ref 150–400)
RBC: 5.2 MIL/uL — ABNORMAL HIGH (ref 3.87–5.11)
RDW: 16.2 % — ABNORMAL HIGH (ref 11.5–15.5)
WBC: 5.6 K/uL (ref 4.0–10.5)
nRBC: 0 % (ref 0.0–0.2)

## 2023-08-27 LAB — MAGNESIUM: Magnesium: 1.9 mg/dL (ref 1.7–2.4)

## 2023-08-27 LAB — TROPONIN I (HIGH SENSITIVITY)
Troponin I (High Sensitivity): 3 ng/L (ref <18)
Troponin I (High Sensitivity): 3 ng/L

## 2023-08-27 LAB — BRAIN NATRIURETIC PEPTIDE: B Natriuretic Peptide: 49.2 pg/mL (ref 0.0–100.0)

## 2023-08-27 LAB — DIGOXIN LEVEL: Digoxin Level: 0.8 ng/mL (ref 0.8–2.0)

## 2023-08-27 LAB — HIV ANTIBODY (ROUTINE TESTING W REFLEX): HIV Screen 4th Generation wRfx: NONREACTIVE

## 2023-08-27 MED ORDER — MAGNESIUM SULFATE 2 GM/50ML IV SOLN
2.0000 g | Freq: Once | INTRAVENOUS | Status: AC
  Administered 2023-08-27: 2 g via INTRAVENOUS
  Filled 2023-08-27 (×2): qty 50

## 2023-08-27 MED ORDER — OXYCODONE HCL 5 MG PO TABS: 5.0000 mg | ORAL_TABLET | ORAL | Status: DC | PRN

## 2023-08-27 MED ORDER — ENOXAPARIN SODIUM 60 MG/0.6ML IJ SOSY
0.5000 mg/kg | PREFILLED_SYRINGE | INTRAMUSCULAR | Status: DC
  Administered 2023-08-27: 55 mg via SUBCUTANEOUS
  Filled 2023-08-27: qty 0.6

## 2023-08-27 MED ORDER — ONDANSETRON HCL 4 MG/2ML IJ SOLN: 4.0000 mg | Freq: Four times a day (QID) | INTRAMUSCULAR | Status: DC | PRN

## 2023-08-27 MED ORDER — FUROSEMIDE 10 MG/ML IJ SOLN
60.0000 mg | Freq: Once | INTRAMUSCULAR | Status: AC
  Administered 2023-08-27: 60 mg via INTRAVENOUS
  Filled 2023-08-27: qty 8

## 2023-08-27 MED ORDER — GABAPENTIN 100 MG PO CAPS
100.0000 mg | ORAL_CAPSULE | Freq: Three times a day (TID) | ORAL | Status: DC
  Administered 2023-08-27 – 2023-08-30 (×8): 100 mg via ORAL
  Filled 2023-08-27 (×8): qty 1

## 2023-08-27 MED ORDER — POTASSIUM CHLORIDE CRYS ER 20 MEQ PO TBCR
40.0000 meq | EXTENDED_RELEASE_TABLET | Freq: Once | ORAL | Status: AC
  Administered 2023-08-27: 40 meq via ORAL
  Filled 2023-08-27: qty 2

## 2023-08-27 MED ORDER — ACETAMINOPHEN 325 MG PO TABS: 650.0000 mg | ORAL_TABLET | Freq: Four times a day (QID) | ORAL | Status: DC | PRN

## 2023-08-27 MED ORDER — POLYETHYLENE GLYCOL 3350 17 G PO PACK
17.0000 g | PACK | Freq: Every day | ORAL | Status: DC | PRN
  Administered 2023-08-27 (×2): 17 g via ORAL
  Filled 2023-08-27 (×2): qty 1

## 2023-08-27 MED ORDER — MIDODRINE HCL 5 MG PO TABS
5.0000 mg | ORAL_TABLET | Freq: Two times a day (BID) | ORAL | Status: DC
  Administered 2023-08-27 – 2023-08-30 (×6): 5 mg via ORAL
  Filled 2023-08-27 (×6): qty 1

## 2023-08-27 MED ORDER — APIXABAN 5 MG PO TABS
5.0000 mg | ORAL_TABLET | Freq: Two times a day (BID) | ORAL | Status: DC
  Administered 2023-08-27 – 2023-08-30 (×6): 5 mg via ORAL
  Filled 2023-08-27 (×6): qty 1

## 2023-08-27 MED ORDER — PANTOPRAZOLE SODIUM 40 MG PO TBEC
40.0000 mg | DELAYED_RELEASE_TABLET | Freq: Every day | ORAL | Status: DC
  Administered 2023-08-27 – 2023-08-30 (×4): 40 mg via ORAL
  Filled 2023-08-27 (×4): qty 1

## 2023-08-27 MED ORDER — FUROSEMIDE 10 MG/ML IJ SOLN
40.0000 mg | Freq: Two times a day (BID) | INTRAMUSCULAR | Status: DC
  Administered 2023-08-27 – 2023-08-29 (×4): 40 mg via INTRAVENOUS
  Filled 2023-08-27 (×5): qty 4

## 2023-08-27 MED ORDER — ACETAMINOPHEN 650 MG RE SUPP
650.0000 mg | Freq: Four times a day (QID) | RECTAL | Status: DC | PRN
Start: 2023-08-27 — End: 2023-08-30

## 2023-08-27 MED ORDER — SERTRALINE HCL 50 MG PO TABS
200.0000 mg | ORAL_TABLET | Freq: Every day | ORAL | Status: DC
  Administered 2023-08-27 – 2023-08-30 (×4): 200 mg via ORAL
  Filled 2023-08-27 (×4): qty 4

## 2023-08-27 NOTE — ED Triage Notes (Signed)
 Pt c/o SOB, increasing fatigue, and BLE pain/weakness x5 days.  Significant cardiac history.   Pt able to speak full sentences.

## 2023-08-27 NOTE — Progress Notes (Signed)
 Heart Failure Navigator Progress Note  Assessed for Heart & Vascular TOC clinic readiness.  Patient does not meet criteria due to current Advanced Heart Failure Team patient of Jules Oar, MD.   Navigator will sign off at this time.  Celedonio Coil, RN, BSN [facility] Heart Failure Navigator Secure Chat Only

## 2023-08-27 NOTE — Progress Notes (Signed)
 PHARMACIST - PHYSICIAN COMMUNICATION  CONCERNING:  Enoxaparin  (Lovenox ) for DVT Prophylaxis    RECOMMENDATION: Patient was prescribed enoxaprin 40mg  q24 hours for VTE prophylaxis.   Filed Weights   08/27/23 1007  Weight: 110.7 kg (244 lb)    Body mass index is 37.1 kg/m.  Estimated Creatinine Clearance: 59.1 mL/min (A) (by C-G formula based on SCr of 1.32 mg/dL (H)).   Based on [facility] policy patient is candidate for enoxaparin  0.5mg /kg TBW SQ every 24 hours based on BMI being >30.  DESCRIPTION: Pharmacy has adjusted enoxaparin  dose per [facility] policy.  Patient is now receiving enoxaparin  55 mg every 24 hours    Damien Napoleon, PharmD Clinical Pharmacist  08/27/2023 11:41 AM

## 2023-08-27 NOTE — H&P (Addendum)
 History and Physical    Patient: Lisa Fuentes FMW:[id number] DOB: 1960/10/28 DOA: 08/27/2023 DOS: the patient was seen and examined on 08/27/2023 PCP: Valora Agent, MD  Patient coming from: Home  Chief Complaint:  Chief Complaint  Patient presents with   Shortness of Breath   Fatigue   HPI: Lisa Fuentes is a 63 y.o. female with medical history significant of nonischemic cardiomyopathy, combined CHF with EF 25-30% on last Echo in 11/2022, Vtach with Medtronic ICD, history of DVT/PE on Eliquis , OSA on CPAP, diabetes non-insulin dependent, HTN who presented to the ED today for evaluation of worsening shortness of breath and fatigue. Reports gradual worsening over past couple of weeks, got significantly worse past couple of days.  Associated lower extremity edema.  Dyspnea on even minimal exertion.  Does admit to missing Lasix  doses at times, but not specific. Reports compliance with Lasix .  ED course -- temp 98.4 F, HR 84, RR 18, BP 103/67, spO2 99% room air Labs -- K 3.2, Cl 95, glucose 103, BUN 24, Cr 1.32 (up from baseline 0.88 on 07/31/23), Ca 10.5, gap 17. BNP 49.2 Hs-troponin 3 CBC notable for Hbg 15.8 Chest x-ray no acute findings  Patient is admitted as observation for IV diuresis for symptoms consistent with decompensated CHF.   Review of Systems: As mentioned in the history of present illness. All other systems reviewed and are negative.   Past Medical History:  Diagnosis Date   AICD (automatic cardioverter/defibrillator) present 2008   a.) Medtronic device placed 2008. b.) replaced 06/21/2013 (Medtronic Evera XT single-chamber ICD; model number [id number]).   Anemia    Anxiety    Aortic dilatation (HCC) 03/24/2019   a.) TTE 03/24/2019: Ao root measured 37 mm. b.) TTE 03/21/2020: Ao root measured 37 mm.   Arthritis    Depression    Difficult intubation    DOE (dyspnea on exertion)    Endometriosis of vagina 09/2013   Intra-operative findings of endometriosis  implants on cervical stump   Essential hypertension    GERD (gastroesophageal reflux disease)    Headache    HFrEF (heart failure with reduced ejection fraction) (HCC)    a.) TTE 02/18/2014: EF 40-45%; tiv AR, mild MR/TR/PR; G1DD. b.) TTE 02/22/2017: EF 45-50%; mild LVH; triv PR; G1DD. c.) TTE 03/24/2019: EF 45-50%; glob HK, mild LVH; triv MR/TR/PR. d.) TTE 03/21/2020: EF 40-45%; glob HK.   History of 2019 novel coronavirus disease (COVID-19) 02/02/2020   History of 2019 novel coronavirus disease (COVID-19) 02/02/2020   Hypokalemia    Hypothyroidism    a.) s/p radioactive iodine  Tx; on levothyroxine    Mixed hyperlipidemia    Nonischemic cardiomyopathy (HCC)    a.) TTE 02/18/2014: EF 40-45%. b.) TTE 02/22/2017: EF 45-50%. c.) TTE 03/24/2019: EF 45-50%. d.) TTE 03/21/2020: EF 40-45%   OSA on CPAP    Panic attacks    PSVT (paroxysmal supraventricular tachycardia) (HCC) 03/30/2020   a.) Holter 03/30/2020 --> 2 runs; fastest lasting 4 beats (184 bpm); longest lasting 4 beats (113 bpm)   Pulmonary embolism (HCC)    Tachycardia    Past Surgical History:  Procedure Laterality Date   ANTERIOR AND POSTERIOR REPAIR N/A 11/25/2017   Procedure: ANTERIOR (CYSTOCELE) AND POSTERIOR REPAIR (RECTOCELE);  Surgeon: Connell Davies, MD;  Location: [facility] ORS;  Service: Gynecology;  Laterality: N/A;   BREAST CYST ASPIRATION Left 03/22/2014   neg/ done by Dr Dellie   CARDIAC CATHETERIZATION  2003   [facility]: No significant coronary artery disease with reduced ejection  fraction.   CARDIAC DEFIBRILLATOR PLACEMENT  04/2006   replaced 05/2013   CARDIAC DEFIBRILLATOR PLACEMENT  06/21/2013   Procedure:  CARDIAC DEFIBRILLATOR PLACEMENT (Medtronic single-chamber ICD, model number Sylvan HEWS, model number [id number]): Location: [facility]; Surgeon: Franky Ned, MD   CARPAL TUNNEL RELEASE Right    COLONOSCOPY  2015   COLONOSCOPY N/A 10/13/2020   Procedure: COLONOSCOPY;  Surgeon: Onita Elspeth Sharper, DO;  Location: [facility]  ENDOSCOPY;  Service: Gastroenterology;  Laterality: N/A;   COLONOSCOPY WITH PROPOFOL  N/A 10/04/2015   Procedure: COLONOSCOPY WITH PROPOFOL ;  Surgeon: Rogelia Copping, MD;  Location: [facility] ENDOSCOPY;  Service: Endoscopy;  Laterality: N/A;   CORONARY ANGIOPLASTY     CYSTOSCOPY  11/15/2014   Procedure: CYSTOSCOPY;  Surgeon: Archie Savers, MD;  Location: [facility] ORS;  Service: Gynecology;;   DIAGNOSTIC LAPAROSCOPY     ETHMOIDECTOMY Right 07/19/2021   Procedure: TOTAL ETHMOIDECTOMY WITH FRONTAL SINUS EXPLORATION;  Surgeon: Edda Mt, MD;  Location: [facility] ORS;  Service: ENT;  Laterality: Right;   FOOT SURGERY Bilateral    heer spur and bunion   IMAGE GUIDED SINUS SURGERY N/A 07/19/2021   Procedure: IMAGE GUIDED SINUS SURGERY;  Surgeon: Edda Mt, MD;  Location: [facility] ORS;  Service: ENT;  Laterality: N/A;   KNEE ARTHROSCOPY Left 03/02/2015   Procedure: ARTHROSCOPY  LEFT KNEE, PARTIAL LATERAL  MENISECTOMY, SYNOVECTOMY, MEDIAL & LATERAL CHONDROPLASTY;  Surgeon: Franky Cranker, MD;  Location: [facility] ORS;  Service: Orthopedics;  Laterality: Left;   LAPAROSCOPIC SALPINGO OOPHERECTOMY Left 11/15/2014   Procedure: LAPAROSCOPIC OOPHORECTOMY;  Surgeon: Archie Savers, MD;  Location: [facility] ORS;  Service: Gynecology;  Laterality: Left;   LAPAROSCOPIC SALPINGOOPHERECTOMY Right 09/2013   also with left salpingectomy   LEFT HEART CATH AND CORONARY ANGIOGRAPHY N/A 03/28/2020   Procedure: LEFT HEART CATH AND CORONARY ANGIOGRAPHY;  Surgeon: Darron Deatrice LABOR, MD;  Location: [facility] INVASIVE CV LAB;  Service: Cardiovascular;  Laterality: N/A;   MAXILLARY ANTROSTOMY Right 07/19/2021   Procedure: MAXILLARY ANTROSTOMY WITH REMOVAL TISSUE;  Surgeon: Edda Mt, MD;  Location: [facility] ORS;  Service: ENT;  Laterality: Right;   MINOR HEMORRHOIDECTOMY     NASAL TURBINATE REDUCTION Bilateral 07/19/2021   Procedure: INFERIOR TURBINATE REDUCTION;  Surgeon: Edda Mt, MD;  Location: [facility] ORS;  Service: ENT;  Laterality: Bilateral;    PULMONARY THROMBECTOMY Bilateral 10/03/2022   Procedure: PULMONARY THROMBECTOMY;  Surgeon: Marea Selinda RAMAN, MD;  Location: [facility] INVASIVE CV LAB;  Service: Cardiovascular;  Laterality: Bilateral;   SEPTOPLASTY N/A 07/19/2021   Procedure: SEPTOPLASTY;  Surgeon: Edda Mt, MD;  Location: [facility] ORS;  Service: ENT;  Laterality: N/A;   TONSILLECTOMY     TOTAL ABDOMINAL HYSTERECTOMY     TRACHELECTOMY N/A 11/15/2014   Procedure: TRACHELECTOMY;  Surgeon: Archie Savers, MD;  Location: [facility] ORS;  Service: Gynecology;  Laterality: N/A;   TRIGGER FINGER RELEASE Right    TUBAL LIGATION Bilateral    Social History:  reports that she has never smoked. She has never used smokeless tobacco. She reports current alcohol use. She reports that she does not use drugs.  Allergies  Allergen Reactions   Sulfa Antibiotics Hives   Sulfamethoxazole-Trimethoprim Hives   Jardiance  [Empagliflozin ] Rash    Yeast rash    Family History  Problem Relation Age of Onset   Hypertension Mother    Hyperlipidemia Mother    Stroke Mother    Colon cancer Mother    Breast cancer Sister 3   Ovarian cancer Sister    Throat cancer Brother     Prior  to Admission medications   Medication Sig Start Date End Date Taking? Authorizing Provider  Acetaminophen  (TYLENOL  ARTHRITIS PAIN PO) Take 1,300 mg by mouth every 6 (six) hours as needed (Pain).    [provider]  apixaban  (ELIQUIS ) 5 MG TABS tablet Take 5 mg by mouth 2 (two) times daily.    [provider]  digoxin  (LANOXIN ) 0.125 MG tablet Take 1 tablet (0.125 mg total) by mouth daily. 07/19/23   Sabharwal, Aditya, DO  ENTRESTO  24-26 MG Take 1 tablet by mouth 2 (two) times daily. 06/19/22   [provider]  esomeprazole (NEXIUM) 40 MG capsule Take 40 mg by mouth every morning.     [provider]  furosemide  (LASIX ) 20 MG tablet Take one tablet (20mg ) by mouth Monday, Wednesday, Friday. 02/11/23   Darliss Rogue, MD  gabapentin  (NEURONTIN ) 100  MG capsule Take 100 mg by mouth 3 (three) times daily. 02/16/21   [provider]  levothyroxine  (SYNTHROID ) 125 MCG tablet Take 125 mcg by mouth every morning. 02/27/21   [provider]  Magnesium  250 MG TABS Take 1 tablet by mouth daily.    [provider]  metoprolol  succinate (TOPROL -XL) 50 MG 24 hr tablet Take 1 tablet (50 mg total) by mouth daily. Take with or immediately following a meal. 04/16/23   Darliss Rogue, MD  oxyCODONE -acetaminophen  (PERCOCET/ROXICET) 5-325 MG tablet Take 1 tablet by mouth every 4 (four) hours as needed for severe pain.    [provider]  sertraline  (ZOLOFT ) 100 MG tablet Take 200 mg by mouth daily. 03/04/20   [provider]  spironolactone  (ALDACTONE ) 25 MG tablet Take 1 tablet (25 mg total) by mouth daily. 11/19/22   Sabharwal, Aditya, DO  tirzepatide  (MOUNJARO ) 5 MG/0.5ML Pen Inject 5 mg into the skin once a week. 08/15/23   Bensimhon, Toribio SAUNDERS, MD    Physical Exam: Vitals:   08/27/23 1006 08/27/23 1007  BP: 103/67   Pulse: 84   Resp: 18   Temp: 98.4 F (36.9 C)   TempSrc: Oral   SpO2: 99%   Weight:  110.7 kg  Height:  5' 8 (1.727 m)   General exam: awake, alert, no acute distress, obese HEENT: atraumatic, clear conjunctiva, anicteric sclera, moist mucus membranes, hearing grossly normal  Respiratory system: CTAB, no wheezes, rales or rhonchi, normal respiratory effort at rest on room air.  Dyspneic with exertion of sitting up in the bed.   Cardiovascular system: normal S1/S2, RRR, unable to visualize JVD, bilteral LE edema appears symmetric Gastrointestinal system: soft, NT, ND Central nervous system: A&O x3. no gross focal neurologic deficits, normal speech Skin: dry, intact, normal temperature Psychiatry: normal mood, congruent affect, judgement and insight appear normal    Data Reviewed:  As reviewed above  Assessment and Plan:   Acute on chronic combined systolic/diastolic CHF /  Nonischemic Cardiomyopathy Pt presents with progressive dyspnea and fatigue over weeks, lower extremity edema, some non-compliance with lasix  possibly contributing. EF in November 2024 was 25-30%, grade I dd. Follows at [facility]. HF clinic visit 07/19/23 reviewed -- started on digoxin  0.125 mg daily Given lasix  60 mg IV x 1 in the ED --Repeat Echo, last was November 2024 and EF at that had declined from ~45% to 25-30% --Consult [facility] Cardiology  --Continue IV Lasix  40 mg IV BID --Add midodrine  5 mg BID for BP support in diuresis --Hold Entresto , Toprol , Aldactone  with BP soft --Monitor renal function daily --Replace electrolytes for K>4, Mg>2 --Telemetry --  Daily weights & Strict Io's --Check digoxin  level, hold digoxin  for now    AKI - ? Cardiorenal etiology.   Cr on admission 1.32, up from 0.88 on 07/31/23 --Monitor closely with diuresis --Avoid other nephrotoxins --Repeat BMP this evening to closely trend, if worsening with diuresis, would point to pre-renal / dehydration.  Note Hbg slightly concentrated.  Would start very gentle IV hydration.   Hx of Vtach s/p AICD --Telemetry --Replace electrolytes for K>4, Mg>2  Hx of recurrent DVT/PE --Continue Eliquis  5 mg BID  Non-insulin dependent Diabetes Type 2 --Hold Mounjaro  --Sliding scale Novolog  Depression  --Continue Zoloft   GERD --Protonix  for home Nexium  Chronic pain --Continue home regimen with oxycodone , gabapentin     Advance Care Planning: Code status - full code  Consults: Cardiology  Family Communication: None present  Severity of Illness: The appropriate patient status for this patient is OBSERVATION. Observation status is judged to be reasonable and necessary in order to provide the required intensity of service to ensure the patient's safety. The patient's presenting symptoms, physical exam findings, and initial radiographic and laboratory data in the context of their medical  condition is felt to place them at decreased risk for further clinical deterioration. Furthermore, it is anticipated that the patient will be medically stable for discharge from the hospital within 2 midnights of admission.   Author: Burnard DELENA Cunning, DO 08/27/2023 11:33 AM  For on call review [email or web address].

## 2023-08-27 NOTE — ED Provider Notes (Signed)
 [facility] Provider Note    Event Date/Time   First MD Initiated Contact with Patient 08/27/23 1014     (approximate)   History   Shortness of Breath and Fatigue   HPI  Lisa Fuentes is a 63 y.o. female who presents to the ED for evaluation of Shortness of Breath and Fatigue   Review of cardiology clinic visit from 6/20.  Nonischemic cardiomyopathy, V. tach with [employer] ICD, reduced EF, PE on Eliquis   Patient presents to the ED for evaluation of dyspnea on exertion, progressively worsening over the past couple weeks.  She reluctantly admits to only taking her Lasix  2 or 3 days/week as she thinks it is the cause of leg cramps and she does not like having to urinate all the time.  Does report compliance with her Eliquis .   Physical Exam   Triage Vital Signs: ED Triage Vitals  Encounter Vitals Group     BP 08/27/23 1006 103/67     Girls Systolic BP Percentile --      Girls Diastolic BP Percentile --      Boys Systolic BP Percentile --      Boys Diastolic BP Percentile --      Pulse Rate 08/27/23 1006 84     Resp 08/27/23 1006 18     Temp 08/27/23 1006 98.4 F (36.9 C)     Temp Source 08/27/23 1006 Oral     SpO2 08/27/23 1006 99 %     Weight 08/27/23 1007 244 lb (110.7 kg)     Height 08/27/23 1007 5' 8 (1.727 m)     Head Circumference --      Peak Flow --      Pain Score 08/27/23 1007 0     Pain Loc --      Pain Education --      Exclude from Growth Chart --     Most recent vital signs: Vitals:   08/27/23 1006  BP: 103/67  Pulse: 84  Resp: 18  Temp: 98.4 F (36.9 C)  SpO2: 99%    General: Awake, no distress.  CV:  Good peripheral perfusion.  Resp:  Normal effort.  Abd:  No distention.  MSK:  No deformity noted.  Neuro:  No focal deficits appreciated. Other:     ED Results / Procedures / Treatments   Labs (all labs ordered are listed, but only abnormal results are displayed) Labs Reviewed  BASIC METABOLIC PANEL WITH  GFR - Abnormal; Notable for the following components:      Result Value   Potassium 3.2 (*)    Chloride 95 (*)    Glucose, Bld 103 (*)    BUN 24 (*)    Creatinine, Ser 1.32 (*)    Calcium 10.5 (*)    GFR, Estimated 46 (*)    Anion gap 17 (*)    All other components within normal limits  CBC - Abnormal; Notable for the following components:   RBC 5.20 (*)    Hemoglobin 15.8 (*)    HCT 47.8 (*)    RDW 16.2 (*)    All other components within normal limits  BRAIN NATRIURETIC PEPTIDE  MAGNESIUM   HIV ANTIBODY (ROUTINE TESTING W REFLEX)  TROPONIN I (HIGH SENSITIVITY)  TROPONIN I (HIGH SENSITIVITY)    EKG Treatment is baseline, sinus rhythm with a rate of 86 bpm.  Normal axis and intervals without clear signs of acute ischemia.  Nonspecific mild J-point depressions to lateral leads.  RADIOLOGY CXR interpreted by me without evidence of acute cardiopulmonary pathology.  Official radiology report(s): DG Chest 2 View Result Date: 08/27/2023 CLINICAL DATA:  Shortness of breath. EXAM: CHEST - 2 VIEW COMPARISON:  07/14/2023. FINDINGS: The heart size and mediastinal contours are unchanged. Stable single lead ICD. No focal consolidation, pleural effusion, or pneumothorax. No acute osseous abnormality. IMPRESSION: No acute cardiopulmonary findings. Electronically Signed   By: Harrietta Sherry M.D.   On: 08/27/2023 11:38    PROCEDURES and INTERVENTIONS:  .1-3 Lead EKG Interpretation  Performed by: Claudene Rover, MD Authorized by: Claudene Rover, MD     Interpretation: normal     ECG rate:  80   ECG rate assessment: normal     Rhythm: sinus rhythm     Ectopy: none     Conduction: normal     Medications  potassium chloride  SA (KLOR-CON  M) CR tablet 40 mEq (has no administration in time range)  furosemide  (LASIX ) injection 60 mg (has no administration in time range)  furosemide  (LASIX ) injection 40 mg (has no administration in time range)  enoxaparin  (LOVENOX ) injection 55 mg (has no  administration in time range)  acetaminophen  (TYLENOL ) tablet 650 mg (has no administration in time range)    Or  acetaminophen  (TYLENOL ) suppository 650 mg (has no administration in time range)  polyethylene glycol (MIRALAX  / GLYCOLAX ) packet 17 g (has no administration in time range)     IMPRESSION / MDM / ASSESSMENT AND PLAN / ED COURSE  I reviewed the triage vital signs and the nursing notes.  Differential diagnosis includes, but is not limited to, ACS, PTX, PNA, muscle strain/spasm, PE, dissection, anxiety, pleural effusion  {Patient presents with symptoms of an acute illness or injury that is potentially life-threatening.  Patient presents with progressive dyspnea on exertion in the setting of noncompliance with her diuretics and known combined CHF, likely CHF exacerbation and ultimately requiring medical admission for diuresis and electrolyte management.  No hypoxia, only dyspnea on exertion.  AKI is noted and likely cardiorenal as she overall is volume overloaded.  Normal BNP.  No leukocytosis or signs of infectious etiology.  Negative troponin.  Initiate potassium replacement, diuresis and consult with medicine for admission  Clinical Course as of 08/27/23 1143  Tue Aug 27, 2023  1134 I consulted medicine who agrees to admit [DS]    Clinical Course User Index [DS] Claudene Rover, MD     FINAL CLINICAL IMPRESSION(S) / ED DIAGNOSES   Final diagnoses:  Acute on chronic combined systolic and diastolic congestive heart failure (HCC)     Rx / DC Orders   ED Discharge Orders     None        Note:  This document was prepared using Dragon voice recognition software and may include unintentional dictation errors.   Claudene Rover, MD 08/27/23 [phone]

## 2023-08-28 ENCOUNTER — Encounter

## 2023-08-28 ENCOUNTER — Other Ambulatory Visit: Payer: Self-pay

## 2023-08-28 ENCOUNTER — Observation Stay (HOSPITAL_BASED_OUTPATIENT_CLINIC_OR_DEPARTMENT_OTHER)
Admit: 2023-08-28 | Discharge: 2023-08-28 | Disposition: A | Discharge status: Home / Self Care | Attending: Internal Medicine

## 2023-08-28 DIAGNOSIS — R5383 Other fatigue: Secondary | ICD-10-CM

## 2023-08-28 DIAGNOSIS — I5021 Acute systolic (congestive) heart failure: Secondary | ICD-10-CM | POA: Diagnosis not present

## 2023-08-28 DIAGNOSIS — R531 Weakness: Secondary | ICD-10-CM

## 2023-08-28 DIAGNOSIS — I429 Cardiomyopathy, unspecified: Secondary | ICD-10-CM | POA: Diagnosis not present

## 2023-08-28 DIAGNOSIS — N179 Acute kidney failure, unspecified: Secondary | ICD-10-CM | POA: Diagnosis not present

## 2023-08-28 DIAGNOSIS — I5022 Chronic systolic (congestive) heart failure: Secondary | ICD-10-CM

## 2023-08-28 DIAGNOSIS — I959 Hypotension, unspecified: Secondary | ICD-10-CM

## 2023-08-28 DIAGNOSIS — I5020 Unspecified systolic (congestive) heart failure: Secondary | ICD-10-CM

## 2023-08-28 DIAGNOSIS — R000 Tachycardia, unspecified: Secondary | ICD-10-CM

## 2023-08-28 DIAGNOSIS — I5043 Acute on chronic combined systolic (congestive) and diastolic (congestive) heart failure: Secondary | ICD-10-CM | POA: Diagnosis not present

## 2023-08-28 DIAGNOSIS — F32A Depression, unspecified: Secondary | ICD-10-CM | POA: Diagnosis not present

## 2023-08-28 LAB — ECHOCARDIOGRAM COMPLETE
AR max vel: 2.06 cm2
AV Area VTI: 2.19 cm2
AV Area mean vel: 2.05 cm2
AV Mean grad: 2 mmHg
AV Peak grad: 4.2 mmHg
Ao pk vel: 1.02 m/s
Area-P 1/2: 6.22 cm2
Calc EF: 35.6 %
Height: 68 in
S' Lateral: 3.4 cm
Single Plane A2C EF: 38.3 %
Single Plane A4C EF: 35.9 %
Weight: 3443.2 [oz_av]

## 2023-08-28 LAB — BASIC METABOLIC PANEL WITH GFR
Anion gap: 11 (ref 5–15)
BUN: 24 mg/dL — ABNORMAL HIGH (ref 6–20)
CO2: 27 mmol/L (ref 22–32)
Calcium: 9.8 mg/dL (ref 8.9–10.3)
Chloride: 98 mmol/L (ref 98–111)
Creatinine, Ser: 1.15 mg/dL — ABNORMAL HIGH (ref 0.44–1.00)
GFR, Estimated: 55 mL/min — ABNORMAL LOW (ref >60)
Glucose, Bld: 112 mg/dL — ABNORMAL HIGH (ref 70–99)
Potassium: 3.5 mmol/L (ref 3.5–5.1)
Sodium: 136 mmol/L (ref 135–145)

## 2023-08-28 LAB — TROPONIN I (HIGH SENSITIVITY): Troponin I (High Sensitivity): 3 ng/L (ref <18)

## 2023-08-28 MED ORDER — BISACODYL 5 MG PO TBEC
5.0000 mg | DELAYED_RELEASE_TABLET | Freq: Every day | ORAL | Status: DC | PRN
  Administered 2023-08-28 – 2023-08-29 (×2): 5 mg via ORAL
  Filled 2023-08-28 (×2): qty 1

## 2023-08-28 MED ORDER — LEVOTHYROXINE SODIUM 125 MCG PO TABS
125.0000 ug | ORAL_TABLET | Freq: Every day | ORAL | Status: DC
Start: 2023-08-28 — End: 2023-08-30
  Administered 2023-08-29 – 2023-08-30 (×2): 125 ug via ORAL
  Filled 2023-08-28 (×3): qty 1

## 2023-08-28 MED ORDER — LOSARTAN POTASSIUM 25 MG PO TABS
12.5000 mg | ORAL_TABLET | Freq: Every day | ORAL | Status: DC
  Administered 2023-08-29: 12.5 mg via ORAL
  Filled 2023-08-28: qty 1

## 2023-08-28 MED ORDER — OXYCODONE-ACETAMINOPHEN 5-325 MG PO TABS: 1.0000 | ORAL_TABLET | Freq: Three times a day (TID) | ORAL | Status: DC | PRN

## 2023-08-28 MED ORDER — BISACODYL 10 MG RE SUPP
10.0000 mg | Freq: Once | RECTAL | Status: AC
  Administered 2023-08-28: 10 mg via RECTAL
  Filled 2023-08-28: qty 1

## 2023-08-28 MED ORDER — POTASSIUM CHLORIDE CRYS ER 20 MEQ PO TBCR
40.0000 meq | EXTENDED_RELEASE_TABLET | Freq: Once | ORAL | Status: AC
  Administered 2023-08-28: 40 meq via ORAL
  Filled 2023-08-28: qty 2

## 2023-08-28 MED ORDER — METOPROLOL SUCCINATE ER 25 MG PO TB24
12.5000 mg | ORAL_TABLET | Freq: Every day | ORAL | Status: DC
  Administered 2023-08-28 – 2023-08-30 (×3): 12.5 mg via ORAL
  Filled 2023-08-28 (×3): qty 1

## 2023-08-28 NOTE — Plan of Care (Signed)
   Problem: Education: Goal: Knowledge of General Education information will improve Description Including pain rating scale, medication(s)/side effects and non-pharmacologic comfort measures Outcome: Progressing

## 2023-08-28 NOTE — Progress Notes (Signed)
*  PRELIMINARY RESULTS* Echocardiogram 2D Echocardiogram has been performed.  Lisa Fuentes 08/28/2023, 10:21 AM

## 2023-08-28 NOTE — Progress Notes (Signed)
 Cardiac Individual Treatment Plan  Patient Details  Name: Lisa Fuentes MRN: [id number] Date of Birth: [birth year] Referring Provider:   Flowsheet Row Cardiac Rehab from 07/31/2023 in [facility] Cardiac and Pulmonary Rehab  Referring Provider Dr. Ria Commander, MD    Initial Encounter Date:  Flowsheet Row Cardiac Rehab from 07/31/2023 in [facility] Cardiac and Pulmonary Rehab  Date 07/31/23    Visit Diagnosis: Heart failure, chronic systolic (HCC)  Patient's Home Medications on Admission: No current facility-administered medications for this visit. No current outpatient medications on file.  Facility-Administered Medications Ordered in Other Visits:    acetaminophen  (TYLENOL ) tablet 650 mg, 650 mg, Oral, Q6H PRN **OR** acetaminophen  (TYLENOL ) suppository 650 mg, 650 mg, Rectal, Q6H PRN, Fausto Sor A, DO   apixaban  (ELIQUIS ) tablet 5 mg, 5 mg, Oral, BID, Fausto Sor A, DO, 5 mg at 08/28/23 9086   furosemide  (LASIX ) injection 40 mg, 40 mg, Intravenous, BID, Fausto Sor A, DO, 40 mg at 08/28/23 0914   gabapentin  (NEURONTIN ) capsule 100 mg, 100 mg, Oral, TID, Fausto Sor A, DO, 100 mg at 08/28/23 0913   levothyroxine  (SYNTHROID ) tablet 125 mcg, 125 mcg, Oral, Q0600, [place], MD   midodrine  (PROAMATINE ) tablet 5 mg, 5 mg, Oral, BID WC, Fausto Sor A, DO, 5 mg at 08/28/23 0913   ondansetron  (ZOFRAN ) injection 4 mg, 4 mg, Intravenous, Q6H PRN, Fausto Sor A, DO   oxyCODONE  (Oxy IR/ROXICODONE ) immediate release tablet 5 mg, 5 mg, Oral, Q4H PRN, Fausto Sor A, DO   pantoprazole  (PROTONIX ) EC tablet 40 mg, 40 mg, Oral, Daily, Fausto Sor A, DO, 40 mg at 08/28/23 0913   polyethylene glycol (MIRALAX  / GLYCOLAX ) packet 17 g, 17 g, Oral, Daily PRN, Fausto Sor A, DO, 17 g at 08/27/23 2125   sertraline  (ZOLOFT ) tablet 200 mg, 200 mg, Oral, Daily, Fausto Sor A, DO, 200 mg at 08/28/23 9087  Past Medical History: Past Medical History:  Diagnosis Date   AICD  (automatic cardioverter/defibrillator) present 2008   a.) Medtronic device placed 2008. b.) replaced 06/21/2013 (Medtronic Evera XT single-chamber ICD; model number [id number]).   Anemia    Anxiety    Aortic dilatation (HCC) 03/24/2019   a.) TTE 03/24/2019: Ao root measured 37 mm. b.) TTE 03/21/2020: Ao root measured 37 mm.   Arthritis    Depression    Difficult intubation    DOE (dyspnea on exertion)    Endometriosis of vagina 09/2013   Intra-operative findings of endometriosis implants on cervical stump   Essential hypertension    GERD (gastroesophageal reflux disease)    Headache    HFrEF (heart failure with reduced ejection fraction) (HCC)    a.) TTE 02/18/2014: EF 40-45%; tiv AR, mild MR/TR/PR; G1DD. b.) TTE 02/22/2017: EF 45-50%; mild LVH; triv PR; G1DD. c.) TTE 03/24/2019: EF 45-50%; glob HK, mild LVH; triv MR/TR/PR. d.) TTE 03/21/2020: EF 40-45%; glob HK.   History of 2019 novel coronavirus disease (COVID-19) 02/02/2020   History of 2019 novel coronavirus disease (COVID-19) 02/02/2020   Hypokalemia    Hypothyroidism    a.) s/p radioactive iodine  Tx; on levothyroxine    Mixed hyperlipidemia    Nonischemic cardiomyopathy (HCC)    a.) TTE 02/18/2014: EF 40-45%. b.) TTE 02/22/2017: EF 45-50%. c.) TTE 03/24/2019: EF 45-50%. d.) TTE 03/21/2020: EF 40-45%   OSA on CPAP    Panic attacks    PSVT (paroxysmal supraventricular tachycardia) (HCC) 03/30/2020   a.) Holter 03/30/2020 --> 2 runs; fastest lasting 4 beats (184 bpm); longest lasting 4 beats (  113 bpm)   Pulmonary embolism (HCC)    Tachycardia     Tobacco Use: Social History   Tobacco Use  Smoking Status Never  Smokeless Tobacco Never    Labs: Review Flowsheet       08/06/2007  Labs for ITP Cardiac and Pulmonary Rehab  TCO2 30      Exercise Target Goals: Exercise Program Goal: Individual exercise prescription set using results from initial 6 min walk test and THRR while considering  patient's activity barriers and  safety.   Exercise Prescription Goal: Initial exercise prescription builds to 30-45 minutes a day of aerobic activity, 2-3 days per week.  Home exercise guidelines will be given to patient during program as part of exercise prescription that the participant will acknowledge.   Education: Aerobic Exercise: - Group verbal and visual presentation on the components of exercise prescription. Introduces F.I.T.T principle from ACSM for exercise prescriptions.  Reviews F.I.T.T. principles of aerobic exercise including progression. Written material given at graduation.   Education: Resistance Exercise: - Group verbal and visual presentation on the components of exercise prescription. Introduces F.I.T.T principle from ACSM for exercise prescriptions  Reviews F.I.T.T. principles of resistance exercise including progression. Written material given at graduation.    Education: Exercise & Equipment Safety: - Individual verbal instruction and demonstration of equipment use and safety with use of the equipment. Flowsheet Row Cardiac Rehab from 07/31/2023 in [facility] Cardiac and Pulmonary Rehab  Date 07/31/23  Educator NT  Instruction Review Code 1- Verbalizes Understanding    Education: Exercise Physiology & General Exercise Guidelines: - Group verbal and written instruction with models to review the exercise physiology of the cardiovascular system and associated critical values. Provides general exercise guidelines with specific guidelines to those with heart or lung disease.    Education: Flexibility, Balance, Mind/Body Relaxation: - Group verbal and visual presentation with interactive activity on the components of exercise prescription. Introduces F.I.T.T principle from ACSM for exercise prescriptions. Reviews F.I.T.T. principles of flexibility and balance exercise training including progression. Also discusses the mind body connection.  Reviews various relaxation techniques to help reduce and manage stress  (i.e. Deep breathing, progressive muscle relaxation, and visualization). Balance handout provided to take home. Written material given at graduation.   Activity Barriers & Risk Stratification:  Activity Barriers & Cardiac Risk Stratification - 07/30/23 1541       Activity Barriers & Cardiac Risk Stratification   Activity Barriers Back Problems;Joint Problems;Balance Concerns;History of Falls   rotator cuff- injured   Cardiac Risk Stratification High          6 Minute Walk:  6 Minute Walk     Row Name 07/31/23 1122         6 Minute Walk   Phase Initial     Distance 780 feet     Walk Time 5.5 minutes     # of Rest Breaks 1     MPH 1.61     METS 2.16     RPE 15     Perceived Dyspnea  2     VO2 Peak 7.56     Symptoms Yes (comment)     Comments fatigue     Resting HR 93 bpm     Resting BP 98/58     Resting Oxygen Saturation  97 %     Exercise Oxygen Saturation  during 6 min walk 97 %     Max Ex. HR 111 bpm     Max Ex. BP 122/66  2 Minute Post BP 102/60        Oxygen Initial Assessment:   Oxygen Re-Evaluation:   Oxygen Discharge (Final Oxygen Re-Evaluation):   Initial Exercise Prescription:  Initial Exercise Prescription - 07/31/23 1100       Date of Initial Exercise RX and Referring Provider   Date 07/31/23    Referring Provider Dr. Aditya Sabharwal, MD      Oxygen   Maintain Oxygen Saturation 88% or higher      Recumbant Bike   Level 1    RPM 50    Watts 15    Minutes 15    METs 2.16      NuStep   Level 2    SPM 80    Minutes 15    METs 2.16      Track   Laps 20    Minutes 15    METs 2.09      Prescription Details   Frequency (times per week) 3    Duration Progress to 30 minutes of continuous aerobic without signs/symptoms of physical distress      Intensity   THRR 40-80% of Max Heartrate 119-146    Ratings of Perceived Exertion 11-13    Perceived Dyspnea 0-4      Progression   Progression Continue to progress workloads to  maintain intensity without signs/symptoms of physical distress.      Resistance Training   Training Prescription Yes    Weight 2 lb, 3 lb    Reps 10-15          Perform Capillary Blood Glucose checks as needed.  Exercise Prescription Changes:   Exercise Prescription Changes     Row Name 07/31/23 1100 08/13/23 1100           Response to Exercise   Blood Pressure (Admit) 98/58 118/70      Blood Pressure (Exercise) 122/66 130/62      Blood Pressure (Exit) 102/60 104/62      Heart Rate (Admit) 93 bpm 98 bpm      Heart Rate (Exercise) 111 bpm 125 bpm      Heart Rate (Exit) 88 bpm 103 bpm      Oxygen Saturation (Admit) 97 % --      Oxygen Saturation (Exercise) 97 % --      Rating of Perceived Exertion (Exercise) 15 12      Perceived Dyspnea (Exercise) 2 --      Symptoms fatigue none      Comments Results first 2 weeks of exercise      Duration -- Progress to 30 minutes of  aerobic without signs/symptoms of physical distress      Intensity -- THRR unchanged        Progression   Progression -- Continue to progress workloads to maintain intensity without signs/symptoms of physical distress.      Average METs -- 3.29        Resistance Training   Training Prescription -- Yes      Weight -- 2 lb, 3 lb      Reps -- 10-15        Interval Training   Interval Training -- No        Recumbant Bike   Level -- 4      Watts -- 15      Minutes -- 15      METs -- 3.5        NuStep   Level -- 5  Minutes -- 15      METs -- 5.5        Oxygen   Maintain Oxygen Saturation -- 88% or higher         Exercise Comments:   Exercise Comments     Row Name 08/05/23 1356           Exercise Comments First full day of exercise!  Patient was oriented to gym and equipment including functions, settings, policies, and procedures.  Patient's individual exercise prescription and treatment plan were reviewed.  All starting workloads were established based on the results of the 6  minute walk test done at initial orientation visit.  The plan for exercise progression was also introduced and progression will be customized based on patient's performance and goals.          Exercise Goals and Review:   Exercise Goals     Row Name 07/31/23 1128             Exercise Goals   Increase Physical Activity Yes       Intervention Provide advice, education, support and counseling about physical activity/exercise needs.;Develop an individualized exercise prescription for aerobic and resistive training based on initial evaluation findings, risk stratification, comorbidities and participant's personal goals.       Expected Outcomes Short Term: Attend rehab on a regular basis to increase amount of physical activity.;Long Term: Add in home exercise to make exercise part of routine and to increase amount of physical activity.;Long Term: Exercising regularly at least 3-5 days a week.       Increase Strength and Stamina Yes       Intervention Provide advice, education, support and counseling about physical activity/exercise needs.;Develop an individualized exercise prescription for aerobic and resistive training based on initial evaluation findings, risk stratification, comorbidities and participant's personal goals.       Expected Outcomes Short Term: Increase workloads from initial exercise prescription for resistance, speed, and METs.;Short Term: Perform resistance training exercises routinely during rehab and add in resistance training at home;Long Term: Improve cardiorespiratory fitness, muscular endurance and strength as measured by increased METs and functional capacity ( )       Able to understand and use rate of perceived exertion (RPE) scale Yes       Intervention Provide education and explanation on how to use RPE scale       Expected Outcomes Short Term: Able to use RPE daily in rehab to express subjective intensity level;Long Term:  Able to use RPE to guide intensity level  when exercising independently       Able to understand and use Dyspnea scale Yes       Intervention Provide education and explanation on how to use Dyspnea scale       Expected Outcomes Short Term: Able to use Dyspnea scale daily in rehab to express subjective sense of shortness of breath during exertion;Long Term: Able to use Dyspnea scale to guide intensity level when exercising independently       Knowledge and understanding of Target Heart Rate Range (THRR) Yes       Intervention Provide education and explanation of THRR including how the numbers were predicted and where they are located for reference       Expected Outcomes Short Term: Able to state/look up THRR;Long Term: Able to use THRR to govern intensity when exercising independently;Short Term: Able to use daily as guideline for intensity in rehab       Able to check pulse independently  Yes       Intervention Provide education and demonstration on how to check pulse in carotid and radial arteries.;Review the importance of being able to check your own pulse for safety during independent exercise       Expected Outcomes Short Term: Able to explain why pulse checking is important during independent exercise;Long Term: Able to check pulse independently and accurately       Understanding of Exercise Prescription Yes       Intervention Provide education, explanation, and written materials on patient's individual exercise prescription       Expected Outcomes Short Term: Able to explain program exercise prescription;Long Term: Able to explain home exercise prescription to exercise independently          Exercise Goals Re-Evaluation :  Exercise Goals Re-Evaluation     Row Name 08/05/23 1356 08/13/23 1142           Exercise Goal Re-Evaluation   Exercise Goals Review Increase Physical Activity;Able to understand and use rate of perceived exertion (RPE) scale;Knowledge and understanding of Target Heart Rate Range (THRR);Understanding of  Exercise Prescription Increase Physical Activity;Increase Strength and Stamina;Understanding of Exercise Prescription      Comments Reviewed RPE and dyspnea scale, THR and program prescription with pt today.  Pt voiced understanding and was given a copy of goals to take home. Kamylah is off to a great start in the program. She has been able to attend her first two sessions during this review period. During her sessions she was able to increase from level 2 to 5 on the T4 nustep, and increase from level 1 to 4 on the recumbent bike. We will continue to monitor her progress in the program.      Expected Outcomes Short: Use RPE daily to regulate intensity.  Long: Follow program prescription in THR. Short: Continue to follow exercise prescription. Long: Continue exercise to improve strength and stamina.         Discharge Exercise Prescription (Final Exercise Prescription Changes):  Exercise Prescription Changes - 08/13/23 1100       Response to Exercise   Blood Pressure (Admit) 118/70    Blood Pressure (Exercise) 130/62    Blood Pressure (Exit) 104/62    Heart Rate (Admit) 98 bpm    Heart Rate (Exercise) 125 bpm    Heart Rate (Exit) 103 bpm    Rating of Perceived Exertion (Exercise) 12    Symptoms none    Comments first 2 weeks of exercise    Duration Progress to 30 minutes of  aerobic without signs/symptoms of physical distress    Intensity THRR unchanged      Progression   Progression Continue to progress workloads to maintain intensity without signs/symptoms of physical distress.    Average METs 3.29      Resistance Training   Training Prescription Yes    Weight 2 lb, 3 lb    Reps 10-15      Interval Training   Interval Training No      Recumbant Bike   Level 4    Watts 15    Minutes 15    METs 3.5      NuStep   Level 5    Minutes 15    METs 5.5      Oxygen   Maintain Oxygen Saturation 88% or higher          Nutrition:  Target Goals: Understanding of nutrition  guidelines, daily intake of sodium 1500mg , cholesterol 200mg , calories 30%  from fat and 7% or less from saturated fats, daily to have 5 or more servings of fruits and vegetables.  Education: All About Nutrition: -Group instruction provided by verbal, written material, interactive activities, discussions, models, and posters to present general guidelines for heart healthy nutrition including fat, fiber, MyPlate, the role of sodium in heart healthy nutrition, utilization of the nutrition label, and utilization of this knowledge for meal planning. Follow up email sent as well. Written material given at graduation.   Biometrics:  Pre Biometrics - 07/31/23 1129       Pre Biometrics   Height 5' 8 (1.727 m)    Weight 227 lb 12.8 oz (103.3 kg)    Waist Circumference 43 inches    Hip Circumference 46 inches    Waist to Hip Ratio 0.93 %    BMI (Calculated) 34.64    Single Leg Stand 2.9 seconds           Nutrition Therapy Plan and Nutrition Goals:  Nutrition Therapy & Goals - 07/31/23 1121       Nutrition Therapy   RD appointment deferred Yes      Intervention Plan   Intervention Prescribe, educate and counsel regarding individualized specific dietary modifications aiming towards targeted core components such as weight, hypertension, lipid management, diabetes, heart failure and other comorbidities.    Expected Outcomes Short Term Goal: Understand basic principles of dietary content, such as calories, fat, sodium, cholesterol and nutrients.;Short Term Goal: A plan has been developed with personal nutrition goals set during dietitian appointment.;Long Term Goal: Adherence to prescribed nutrition plan.          Nutrition Assessments:  MEDIFICTS Score Key: >=70 Need to make dietary changes  40-70 Heart Healthy Diet <= 40 Therapeutic Level Cholesterol Diet   Picture Your Plate Scores: <59 Unhealthy dietary pattern with much room for improvement. 41-50 Dietary pattern unlikely to  meet recommendations for good health and room for improvement. 51-60 More healthful dietary pattern, with some room for improvement.  >60 Healthy dietary pattern, although there may be some specific behaviors that could be improved.    Nutrition Goals Re-Evaluation:   Nutrition Goals Discharge (Final Nutrition Goals Re-Evaluation):   Psychosocial: Target Goals: Acknowledge presence or absence of significant depression and/or stress, maximize coping skills, provide positive support system. Participant is able to verbalize types and ability to use techniques and skills needed for reducing stress and depression.   Education: Stress, Anxiety, and Depression - Group verbal and visual presentation to define topics covered.  Reviews how body is impacted by stress, anxiety, and depression.  Also discusses healthy ways to reduce stress and to treat/manage anxiety and depression.  Written material given at graduation.   Education: Sleep Hygiene -Provides group verbal and written instruction about how sleep can affect your health.  Define sleep hygiene, discuss sleep cycles and impact of sleep habits. Review good sleep hygiene tips.    Initial Review & Psychosocial Screening:  Initial Psych Review & Screening - 07/30/23 1545       Initial Review   Current issues with Current Depression;History of Depression;Current Stress Concerns;Current Psychotropic Meds    Source of Stress Concerns Family;Unable to participate in former interests or hobbies;Unable to perform yard/household activities      Family Dynamics   Good Support System? Yes   family     Barriers   Psychosocial barriers to participate in program There are no identifiable barriers or psychosocial needs.;The patient should benefit from training in stress management and  relaxation.      Screening Interventions   Interventions Encouraged to exercise;To provide support and resources with identified psychosocial needs;Provide feedback  about the scores to participant;Program counselor consult    Expected Outcomes Short Term goal: Utilizing psychosocial counselor, staff and physician to assist with identification of specific Stressors or current issues interfering with healing process. Setting desired goal for each stressor or current issue identified.;Long Term Goal: Stressors or current issues are controlled or eliminated.;Short Term goal: Identification and review with participant of any Quality of Life or Depression concerns found by scoring the questionnaire.;Long Term goal: The participant improves quality of Life and PHQ9 Scores as seen by post scores and/or verbalization of changes          Quality of Life Scores:   Quality of Life - 08/05/23 1435       Quality of Life   Select Quality of Life      Quality of Life Scores   Health/Function Pre 19.11 %    Socioeconomic Pre 22.5 %    Psych/Spiritual Pre 24 %    Family Pre 21 %    GLOBAL Pre 21.07 %         Scores of 19 and below usually indicate a poorer quality of life in these areas.  A difference of  2-3 points is a clinically meaningful difference.  A difference of 2-3 points in the total score of the Quality of Life Index has been associated with significant improvement in overall quality of life, self-image, physical symptoms, and general health in studies assessing change in quality of life.  PHQ-9: Review Flowsheet       07/31/2023 11/02/2022  Depression screen PHQ 2/9  Decreased Interest 1 0  Down, Depressed, Hopeless 1 0  PHQ - 2 Score 2 0  Altered sleeping 3 -  Tired, decreased energy 3 -  Change in appetite 0 -  Feeling bad or failure about yourself  0 -  Trouble concentrating 1 -  Moving slowly or fidgety/restless 0 -  Suicidal thoughts 0 -  PHQ-9 Score 9 -  Difficult doing work/chores Somewhat difficult -   Interpretation of Total Score  Total Score Depression Severity:  1-4 = Minimal depression, 5-9 = Mild depression, 10-14 = Moderate  depression, 15-19 = Moderately severe depression, 20-27 = Severe depression   Psychosocial Evaluation and Intervention:  Psychosocial Evaluation - 07/30/23 1557       Psychosocial Evaluation & Interventions   Interventions Encouraged to exercise with the program and follow exercise prescription;Stress management education;Relaxation education    Comments Ms. Mcclay is coming to cardiac rehab with systolic HF. She states she has been in poor health for a while now, unable to do do the things she once did without feeling short of breath or very tired. She notes she has become weaker and is dealing with rotator cuff issues after her last fall. She is going to PT for her shoulder and wants to start this program to see if she can increase her stamina. She is on medication for depression and notes that she has had multiple deaths in the family over the recent years, including her son, that have worsened her depression. Her medication helps some but some days when it gets too bad, she lays in the bed. 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    Expected Outcomes Short: attend cardiac  rehab for education and exercise  Long: develop and maintain positive self care habits.    Continue Psychosocial Services  Follow up required by staff          Psychosocial Re-Evaluation:   Psychosocial Discharge (Final Psychosocial Re-Evaluation):   Vocational Rehabilitation: Provide vocational rehab assistance to qualifying candidates.   Vocational Rehab Evaluation & Intervention:  Vocational Rehab - 07/30/23 1545       Initial Vocational Rehab Evaluation & Intervention   Assessment shows need for Vocational Rehabilitation No          Education: Education Goals: Education classes will be provided on a variety of topics geared toward better understanding of heart health and risk factor modification.  Participant will state understanding/return demonstration of topics presented as noted by education test scores.  Learning Barriers/Preferences:  Learning Barriers/Preferences - 07/30/23 1544       Learning Barriers/Preferences   Learning Barriers None    Learning Preferences None          General Cardiac Education Topics:  AED/CPR: - Group verbal and written instruction with the use of models to demonstrate the basic use of the AED with the basic ABC's of resuscitation.   Anatomy and Cardiac Procedures: - Group verbal and visual presentation and models provide information about basic cardiac anatomy and function. Reviews the testing methods done to diagnose heart disease and the outcomes of the test results. Describes the treatment choices: Medical Management, Angioplasty, or Coronary Bypass Surgery for treating various heart conditions including Myocardial Infarction, Angina, Valve Disease, and Cardiac Arrhythmias.  Written material given at graduation.   Medication Safety: - Group verbal and visual instruction to review commonly prescribed medications for heart and lung disease. Reviews the medication, class of the drug, and side effects. Includes the steps to properly store meds and maintain the prescription regimen.  Written material given at graduation.   Intimacy: - Group verbal instruction through game format to discuss how heart and lung disease can affect sexual intimacy. Written material given at graduation..   Know Your Numbers and Heart Failure: - Group verbal and visual instruction to discuss disease risk factors for cardiac and pulmonary disease and treatment options.  Reviews associated critical values for Overweight/Obesity, Hypertension, Cholesterol, and Diabetes.  Discusses basics of heart failure: signs/symptoms and treatments.  Introduces Heart Failure Zone chart for action plan for heart failure.  Written material given at graduation.   Infection  Prevention: - Provides verbal and written material to individual with discussion of infection control including proper hand washing and proper equipment cleaning during exercise session. Flowsheet Row Cardiac Rehab from 07/31/2023 in [facility] Cardiac and Pulmonary Rehab  Date 07/31/23  Educator NT  Instruction Review Code 1- Verbalizes Understanding    Falls Prevention: - Provides verbal and written material to individual with discussion of falls prevention and safety. Flowsheet Row Cardiac Rehab from 07/31/2023 in [facility] Cardiac and Pulmonary Rehab  Date 07/31/23  Educator NT  Instruction Review Code 1- Verbalizes Understanding    Other: -Provides group and verbal instruction on various topics (see comments)   Knowledge Questionnaire Score:  Knowledge Questionnaire Score - 08/05/23 1435       Knowledge Questionnaire Score   Pre Score 22/26          Core Components/Risk Factors/Patient Goals at Admission:  Personal Goals and Risk Factors at Admission - 07/30/23 1605       Core Components/Risk Factors/Patient Goals on Admission   Heart Failure Yes    Intervention Provide  a combined exercise and nutrition program that is supplemented with education, support and counseling about heart failure. Directed toward relieving symptoms such as shortness of breath, decreased exercise tolerance, and extremity edema.    Expected Outcomes Improve functional capacity of life;Short term: Attendance in program 2-3 days a week with increased exercise capacity. Reported lower sodium intake. Reported increased fruit and vegetable intake. Reports medication compliance.;Short term: Daily weights obtained and reported for increase. Utilizing diuretic protocols set by physician.;Long term: Adoption of self-care skills and reduction of barriers for early signs and symptoms recognition and intervention leading to self-care maintenance.    Hypertension Yes    Intervention Provide education on lifestyle  modifcations including regular physical activity/exercise, weight management, moderate sodium restriction and increased consumption of fresh fruit, vegetables, and low fat dairy, alcohol moderation, and smoking cessation.;Monitor prescription use compliance.    Expected Outcomes Short Term: Continued assessment and intervention until BP is < 140/86mm HG in hypertensive participants. < 130/87mm HG in hypertensive participants with diabetes, heart failure or chronic kidney disease.;Long Term: Maintenance of blood pressure at goal levels.          Education:Diabetes - Individual verbal and written instruction to review signs/symptoms of diabetes, desired ranges of glucose level fasting, after meals and with exercise. Acknowledge that pre and post exercise glucose checks will be done for 3 sessions at entry of program.   Core Components/Risk Factors/Patient Goals Review:    Core Components/Risk Factors/Patient Goals at Discharge (Final Review):    ITP Comments:  ITP Comments     Row Name 07/30/23 1605 07/31/23 1120 08/05/23 1355 08/28/23 1011     ITP Comments Initial phone call completed. Diagnosis can be found in [facility] 6/20. EP Orientation scheduled for Wednesday 7/2 at 9:30. Completed and gym orientation for cardiac rehab. Initial ITP created and sent for review to Dr. Oneil Pinal, Medical Director. First full day of exercise!  Patient was oriented to gym and equipment including functions, settings, policies, and procedures.  Patient's individual exercise prescription and treatment plan were reviewed.  All starting workloads were established based on the results of the 6 minute walk test done at initial orientation visit.  The plan for exercise progression was also introduced and progression will be customized based on patient's performance and goals. 30 Day review completed. Medical Director ITP review done, changes made as directed, and signed approval by Medical Director.  New to program.        Comments: 30 day review

## 2023-08-28 NOTE — Plan of Care (Signed)

## 2023-08-28 NOTE — Care Management Obs Status (Signed)
 MEDICARE OBSERVATION STATUS NOTIFICATION   Patient Details  Name: Lisa Fuentes MRN: [id number] Date of Birth: 1960-02-08   Medicare Observation Status Notification Given:  No (patient did not want a copy)    [name] 08/28/2023, 11:25 AM

## 2023-08-28 NOTE — Progress Notes (Signed)
  PROGRESS NOTE    Lisa Fuentes  FMW:[id number] DOB: [birth year] DOA: 08/27/2023 PCP: Valora Agent, MD  [id number]  LOS: 0 days   Brief hospital course: Lisa Fuentes is a 63 y.o. female with medical history significant of nonischemic cardiomyopathy, combined CHF with EF 25-30% on last Echo in 11/2022, Vtach with Medtronic ICD, history of DVT/PE on Eliquis , OSA on CPAP, diabetes non-insulin dependent, HTN who presented to the ED today for evaluation of worsening shortness of breath and fatigue. Reports gradual worsening over past couple of weeks, got significantly worse past couple of days.  Associated lower extremity edema.  Dyspnea on even minimal exertion.   Assessment & Plan:  Acute on chronic combined CHF  Nonischemic Cardiomyopathy Pt presents with progressive dyspnea and fatigue over weeks, lower extremity edema, some non-compliance with lasix  possibly contributing. EF in November 2024 was 25-30%, grade I dd. Follows at [facility]. HF clinic visit 07/19/23 reviewed -- started on digoxin  0.125 mg daily Given lasix  60 mg IV x 1 in the ED Current Echo LVEF 30-35%. --consult cardio --cont IV lasix  40 BID for now --Hold Entresto , Toprol , Aldactone  with BP soft  Hypotension --cont midodrine  (new)   AKI  Cr on admission 1.32, up from 0.88 on 07/31/23 --monitor Cr with diuresis   Hx of Vtach s/p AICD --monitor on tele   Hx of recurrent DVT/PE --Continue Eliquis  5 mg BID   Non-insulin dependent Diabetes Type 2 --Hold Mounjaro  --no need for BG checks   Depression  --Continue Zoloft    GERD --Protonix  for home Nexium   Chronic pain on chronic opioids --cont home percocet   Hypokalemia --monitor and supplement PRN   DVT prophylaxis: On:Eliquis  Code Status: Full code  Family Communication:  Level of care: Telemetry Cardiac Dispo:   The patient is from: home Anticipated d/c is to: home Anticipated d/c date is: 2-3 days   Subjective  and Interval History:  Pt reported DOE, no swelling.  Pt reported not much urine output with IV lasix .   Objective: Vitals:   08/28/23 0738 08/28/23 1148 08/28/23 1609 08/28/23 1945  BP: 99/74 110/77 99/73 106/74  Pulse: 92 90 90 85  Resp: 16 18 18 18   Temp: 98.2 F (36.8 C) 98 F (36.7 C) 98.5 F (36.9 C) 98.1 F (36.7 C)  TempSrc:  Oral Oral   SpO2: 98% 100% 94% 99%  Weight:      Height:        Intake/Output Summary (Last 24 hours) at 08/28/2023 2050 Last data filed at 08/28/2023 1930 Gross per 24 hour  Intake 1090 ml  Output 750 ml  Net 340 ml   Filed Weights   08/27/23 1007 08/28/23 0500  Weight: 110.7 kg 97.6 kg    Examination:   Constitutional: NAD, AAOx3 HEENT: conjunctivae and lids normal, EOMI CV: No cyanosis.   RESP: normal respiratory effort, on RA Neuro: II - XII grossly intact.   Psych: Normal mood and affect.  Appropriate judgement and reason   Data Reviewed: I have personally reviewed labs and imaging studies  Time spent: 50 minutes  Ellouise Haber, MD [facility] If 7PM-7AM, please contact night-coverage 08/28/2023, 8:50 PM

## 2023-08-28 NOTE — Consult Note (Signed)
 Cardiology Consultation   Patient ID: [name] MRN: [id number]; DOB: 11/01/1960  Admit date: 08/27/2023 Date of Consult: 08/28/2023  PCP:  Valora Agent, MD   [facility] Providers Cardiologist:  Redell Cave, MD  Electrophysiologist:  Elspeth Sage, MD       Patient Profile: Lisa Fuentes is a 63 y.o. female with a hx of systolic heart failure due to NICM, VT s/p MDT ICD, morbid obesity, hypertension, PAD s/p endovascular thrombolysis 09/2022 on Eliquis , OSA, type 2 diabetes, and right upper extremity DVT (06/2021) who is being seen 08/28/2023 for the evaluation of dyspnea at the request of Dr. Awanda.  History of Present Illness: Lisa Fuentes has a longstanding history of systolic heart failure dating back to 2000.  In 2003 without evidence of CAD.  Repeat cath 2022 with normal coronaries.  Echo 09/2022 showed EF 30 to 35% with moderate LVH, G1 DD, and mild LAE.  Nuclear stress testing 12/2022 showed no evidence of ischemia, EF 40%.  She is followed as an outpatient by the advanced heart failure clinic.  She was most recently seen 07/19/2023.  She reported fatigue and was started on digoxin .  PYP scan was ordered to evaluate for possible amyloid and repeat echo was ordered.  Patient reports several day history of worsening fatigue, lower extremity weakness, and dyspnea on exertion.  She reports becoming extremely fatigued with very little activity. This is also associated with a feeling of heart racing. She decided to be evaluated in the ED for the symptoms.  In the ED, vital signs were normal.  Pertinent labs include K3.2, BUN 24, creatinine 1.32.  BNP 49.2.  Chest x-ray without acute findings.  EKG without acute ischemic changes.  She was given IV Lasix  60 mg x 1, 40 mg x 2 with poor urine output.  Blood pressure running low and GDMT was held.  She was started on midodrine  5 mg twice daily to help with blood pressure support.  Cardiology was asked to consult for further  evaluation of dyspnea on exertion.  At time of cardiology consult, patient is resting comfortably without dyspnea.  She reports compliance with her home medications, although ED provider documented that she had only been taking Lasix  2-3 times per week.  She denies chest pain, lightheadedness, dizziness, lower extremity swelling, orthopnea, and PND.  Denies fevers, chills, and cough.  No recent sick contacts.  Past Medical History:  Diagnosis Date   AICD (automatic cardioverter/defibrillator) present 2008   a.) Medtronic device placed 2008. b.) replaced 06/21/2013 (Medtronic Evera XT single-chamber ICD; model number [id number]).   Anemia    Anxiety    Aortic dilatation (HCC) 03/24/2019   a.) TTE 03/24/2019: Ao root measured 37 mm. b.) TTE 03/21/2020: Ao root measured 37 mm.   Arthritis    Depression    Difficult intubation    DOE (dyspnea on exertion)    Endometriosis of vagina 09/2013   Intra-operative findings of endometriosis implants on cervical stump   Essential hypertension    GERD (gastroesophageal reflux disease)    Headache    HFrEF (heart failure with reduced ejection fraction) (HCC)    a.) TTE 02/18/2014: EF 40-45%; tiv AR, mild MR/TR/PR; G1DD. b.) TTE 02/22/2017: EF 45-50%; mild LVH; triv PR; G1DD. c.) TTE 03/24/2019: EF 45-50%; glob HK, mild LVH; triv MR/TR/PR. d.) TTE 03/21/2020: EF 40-45%; glob HK.   History of 2019 novel coronavirus disease (COVID-19) 02/02/2020   History of 2019 novel coronavirus disease (COVID-19) 02/02/2020   Hypokalemia  Hypothyroidism    a.) s/p radioactive iodine  Tx; on levothyroxine    Mixed hyperlipidemia    Nonischemic cardiomyopathy (HCC)    a.) TTE 02/18/2014: EF 40-45%. b.) TTE 02/22/2017: EF 45-50%. c.) TTE 03/24/2019: EF 45-50%. d.) TTE 03/21/2020: EF 40-45%   OSA on CPAP    Panic attacks    PSVT (paroxysmal supraventricular tachycardia) (HCC) 03/30/2020   a.) Holter 03/30/2020 --> 2 runs; fastest lasting 4 beats (184 bpm); longest  lasting 4 beats (113 bpm)   Pulmonary embolism (HCC)    Tachycardia     Past Surgical History:  Procedure Laterality Date   ANTERIOR AND POSTERIOR REPAIR N/A 11/25/2017   Procedure: ANTERIOR (CYSTOCELE) AND POSTERIOR REPAIR (RECTOCELE);  Surgeon: Connell Davies, MD;  Location: [facility] ORS;  Service: Gynecology;  Laterality: N/A;   BREAST CYST ASPIRATION Left 03/22/2014   neg/ done by Dr Dellie   CARDIAC CATHETERIZATION  2003   [facility]: No significant coronary artery disease with reduced ejection fraction.   CARDIAC DEFIBRILLATOR PLACEMENT  04/2006   replaced 05/2013   CARDIAC DEFIBRILLATOR PLACEMENT  06/21/2013   Procedure:  CARDIAC DEFIBRILLATOR PLACEMENT (Medtronic single-chamber ICD, model number Sylvan HEWS, model number [id number]): Location: [facility]; Surgeon: Franky Ned, MD   CARPAL TUNNEL RELEASE Right    COLONOSCOPY  2015   COLONOSCOPY N/A 10/13/2020   Procedure: COLONOSCOPY;  Surgeon: Onita Elspeth Sharper, DO;  Location: [facility] ENDOSCOPY;  Service: Gastroenterology;  Laterality: N/A;   COLONOSCOPY WITH PROPOFOL  N/A 10/04/2015   Procedure: COLONOSCOPY WITH PROPOFOL ;  Surgeon: Rogelia Copping, MD;  Location: [facility] ENDOSCOPY;  Service: Endoscopy;  Laterality: N/A;   CORONARY ANGIOPLASTY     CYSTOSCOPY  11/15/2014   Procedure: CYSTOSCOPY;  Surgeon: Davies Connell, MD;  Location: [facility] ORS;  Service: Gynecology;;   DIAGNOSTIC LAPAROSCOPY     ETHMOIDECTOMY Right 07/19/2021   Procedure: TOTAL ETHMOIDECTOMY WITH FRONTAL SINUS EXPLORATION;  Surgeon: Edda Mt, MD;  Location: [facility] ORS;  Service: ENT;  Laterality: Right;   FOOT SURGERY Bilateral    heer spur and bunion   IMAGE GUIDED SINUS SURGERY N/A 07/19/2021   Procedure: IMAGE GUIDED SINUS SURGERY;  Surgeon: Edda Mt, MD;  Location: [facility] ORS;  Service: ENT;  Laterality: N/A;   KNEE ARTHROSCOPY Left 03/02/2015   Procedure: ARTHROSCOPY  LEFT KNEE, PARTIAL LATERAL  MENISECTOMY, SYNOVECTOMY, MEDIAL & LATERAL CHONDROPLASTY;  Surgeon: Franky Cranker, MD;  Location: [facility] ORS;  Service: Orthopedics;  Laterality: Left;   LAPAROSCOPIC SALPINGO OOPHERECTOMY Left 11/15/2014   Procedure: LAPAROSCOPIC OOPHORECTOMY;  Surgeon: Davies Connell, MD;  Location: [facility] ORS;  Service: Gynecology;  Laterality: Left;   LAPAROSCOPIC SALPINGOOPHERECTOMY Right 09/2013   also with left salpingectomy   LEFT HEART CATH AND CORONARY ANGIOGRAPHY N/A 03/28/2020   Procedure: LEFT HEART CATH AND CORONARY ANGIOGRAPHY;  Surgeon: Darron Deatrice LABOR, MD;  Location: [facility] INVASIVE CV LAB;  Service: Cardiovascular;  Laterality: N/A;   MAXILLARY ANTROSTOMY Right 07/19/2021   Procedure: MAXILLARY ANTROSTOMY WITH REMOVAL TISSUE;  Surgeon: Edda Mt, MD;  Location: [facility] ORS;  Service: ENT;  Laterality: Right;   MINOR HEMORRHOIDECTOMY     NASAL TURBINATE REDUCTION Bilateral 07/19/2021   Procedure: INFERIOR TURBINATE REDUCTION;  Surgeon: Edda Mt, MD;  Location: [facility] ORS;  Service: ENT;  Laterality: Bilateral;   PULMONARY THROMBECTOMY Bilateral 10/03/2022   Procedure: PULMONARY THROMBECTOMY;  Surgeon: Marea Selinda RAMAN, MD;  Location: [facility] INVASIVE CV LAB;  Service: Cardiovascular;  Laterality: Bilateral;   SEPTOPLASTY N/A 07/19/2021   Procedure: SEPTOPLASTY;  Surgeon: Edda Mt, MD;  Location: [facility] ORS;  Service: ENT;  Laterality: N/A;   TONSILLECTOMY     TOTAL ABDOMINAL HYSTERECTOMY     TRACHELECTOMY N/A 11/15/2014   Procedure: TRACHELECTOMY;  Surgeon: Archie Savers, MD;  Location: [facility] ORS;  Service: Gynecology;  Laterality: N/A;   TRIGGER FINGER RELEASE Right    TUBAL LIGATION Bilateral        Scheduled Meds:  apixaban   5 mg Oral BID   bisacodyl   10 mg Rectal Once   furosemide   40 mg Intravenous BID   gabapentin   100 mg Oral TID   levothyroxine   125 mcg Oral Q0600   midodrine   5 mg Oral BID WC   pantoprazole   40 mg Oral Daily   sertraline   200 mg Oral Daily   Continuous Infusions:  PRN Meds: acetaminophen  **OR** acetaminophen , ondansetron  (ZOFRAN ) IV,  oxyCODONE , polyethylene glycol  Allergies:    Allergies  Allergen Reactions   Sulfa Antibiotics Hives   Sulfamethoxazole-Trimethoprim Hives   Jardiance  [Empagliflozin ] Rash    Yeast rash    Social History:   Social History   Socioeconomic History   Marital status: Married    Spouse name: Jimmy   Number of children: 2   Years of education: Not on file   Highest education level: Not on file  Occupational History   Not on file  Tobacco Use   Smoking status: Never   Smokeless tobacco: Never  Vaping Use   Vaping status: Never Used  Substance and Sexual Activity   Alcohol use: Yes    Alcohol/week: 0.0 standard drinks of alcohol    Comment: occassionally   Drug use: No   Sexual activity: Not Currently    Birth control/protection: Surgical  Other Topics Concern   Not on file  Social History Narrative   Not on file   Social Drivers of Health   Financial Resource Strain: Low Risk  (03/11/2023)   Received from [facility]   Overall Financial Resource Strain (CARDIA)    Difficulty of Paying Living Expenses: Not hard at all  Food Insecurity: No Food Insecurity (08/27/2023)   Hunger Vital Sign    Worried About Running Out of Food in the Last Year: Never true    Ran Out of Food in the Last Year: Never true  Transportation Needs: No Transportation Needs (08/27/2023)   PRAPARE - Administrator, Civil Service (Medical): No    Lack of Transportation (Non-Medical): No  Physical Activity: Not on file  Stress: Not on file  Social Connections: Not on file  Intimate Partner Violence: Not At Risk (08/27/2023)   Humiliation, Afraid, Rape, and Kick questionnaire    Fear of Current or Ex-Partner: No    Emotionally Abused: No    Physically Abused: No    Sexually Abused: No    Family History:    Family History  Problem Relation Age of Onset   Hypertension Mother    Hyperlipidemia Mother    Stroke Mother    Colon cancer Mother    Breast cancer Sister  50   Ovarian cancer Sister    Throat cancer Brother      ROS:  Please see the history of present illness.   Physical Exam/Data: Vitals:   08/28/23 0316 08/28/23 0500 08/28/23 0738 08/28/23 1148  BP: 98/77  99/74 110/77  Pulse: 83  92 90  Resp: 18  16 18   Temp: 97.7 F (36.5 C)  98.2 F (36.8 C) 98 F (36.7 C)  TempSrc:  Oral  SpO2: 99%  98% 100%  Weight:  97.6 kg    Height:        Intake/Output Summary (Last 24 hours) at 08/28/2023 1403 Last data filed at 08/28/2023 1148 Gross per 24 hour  Intake 730 ml  Output 400 ml  Net 330 ml      08/28/2023    5:00 AM 08/27/2023   10:07 AM 08/16/2023   10:58 AM  Last 3 Weights  Weight (lbs) 215 lb 3.2 oz 244 lb 223 lb 9.6 oz  Weight (kg) 97.614 kg 110.678 kg 101.424 kg     Body mass index is 32.72 kg/m.  General:  Well nourished, well developed, in no acute distress HEENT: normal Neck: no JVD Vascular: No carotid bruits; Distal pulses 2+ bilaterally Cardiac:  normal S1, S2; RRR; no murmur Lungs:  clear to auscultation bilaterally, no wheezing, rhonchi or rales  Abd: soft, nontender, no hepatomegaly  Ext: no edema Skin: warm and dry  Psych:  Normal affect   EKG:  The EKG was personally reviewed and demonstrates:  sinus rhythm with repolarization abnormality, consistent with prior tracings Telemetry:  Telemetry was personally reviewed and demonstrates:  Sinus rhythm with intermittent tachycardia with activity, up to 110 bpm  Relevant CV Studies:  08/28/2023 Echo complete 1. Left ventricular ejection fraction, by estimation, is 30 to 35%. The  left ventricle has moderately decreased function. The left ventricle has  no regional wall motion abnormalities. Left ventricular diastolic  parameters are consistent with Grade I  diastolic dysfunction (impaired relaxation).   2. Right ventricular systolic function is normal. The right ventricular  size is normal. There is normal pulmonary artery systolic pressure. The  estimated  right ventricular systolic pressure is 12.3 mmHg.   3. The mitral valve is normal in structure. No evidence of mitral valve  regurgitation. No evidence of mitral stenosis.   4. The aortic valve is normal in structure. Aortic valve regurgitation is  not visualized. No aortic stenosis is present.   5. The inferior vena cava is normal in size with greater than 50%  respiratory variability, suggesting right atrial pressure of 3 mmHg.   Laboratory Data: High Sensitivity Troponin:   Recent Labs  Lab 08/27/23 1010 08/27/23 1735  TROPONINIHS 3 3     Chemistry Recent Labs  Lab 08/27/23 1010 08/27/23 1735 08/28/23 0318  NA 137 136 136  K 3.2* 3.0* 3.5  CL 95* 94* 98  CO2 25 28 27   GLUCOSE 103* 101* 112*  BUN 24* 23* 24*  CREATININE 1.32* 1.13* 1.15*  CALCIUM 10.5* 9.8 9.8  MG 1.9  --   --   GFRNONAA 46* 56* 55*  ANIONGAP 17* 14 11    No results for input(s): PROT, ALBUMIN, AST, ALT, ALKPHOS, BILITOT in the last 168 hours. Lipids No results for input(s): CHOL, TRIG, HDL, LABVLDL, LDLCALC, CHOLHDL in the last 168 hours.  Hematology Recent Labs  Lab 08/27/23 1010  WBC 5.6  RBC 5.20*  HGB 15.8*  HCT 47.8*  MCV 91.9  MCH 30.4  MCHC 33.1  RDW 16.2*  PLT 207   Thyroid  No results for input(s): TSH, FREET4 in the last 168 hours.  BNP Recent Labs  Lab 08/27/23 1010  BNP 49.2    DDimer No results for input(s): DDIMER in the last 168 hours.  Radiology/Studies:  DG Chest 2 View Result Date: 08/27/2023 IMPRESSION: No acute cardiopulmonary findings. Electronically Signed   By: Harrietta Sherry M.D.   On: 08/27/2023 11:38  Assessment and Plan:  HFrEF s/p ICD - Patient presenting with several days of fatigue, lower extremity weakness, and dyspnea on exertion - Echo overall stable from prior with EF at 30-35% and G1DD, RV systolic function improved to normal.  - BNP 49 - Received IV Lasix  60 mg x 1, 40 mg x 2 with reportedly poor UOP - Appears  relatively euvolemic on exam - Can continue IV Lasix  for now with low threshold to discontinue given hypotension - Continue to monitor kidney function, strict I/Os, and daily weights with ongoing diuresis - Recommend weaning midodrine  and slowly reintroducing GDMT as BP/renal function allows  - Will start metoprolol  succinate 12.5 mg daily given sinus tachycardia worsened with activity  AKI - Cr 1.32 on admission - Improved some with diuresis, could be cardiorenal  - Continue to monitor closely  History of DVT/PE - Continued on Eliquis  5 mg twice daily   [place]  Heart Association (NYHA) Functional Class NYHA Class II-III  For questions or updates, please contact [facility] Please consult [email or web address] for contact info under    Signed, Lesley LITTIE Maffucci, PA-C  08/28/2023 2:03 PM

## 2023-08-28 NOTE — Progress Notes (Signed)
*  PRELIMINARY RESULTS* Echocardiogram 2D Echocardiogram has been performed.  Floydene Harder 08/28/2023, 10:22 AM

## 2023-08-29 ENCOUNTER — Other Ambulatory Visit: Payer: Self-pay

## 2023-08-29 ENCOUNTER — Encounter

## 2023-08-29 DIAGNOSIS — N179 Acute kidney failure, unspecified: Secondary | ICD-10-CM | POA: Diagnosis not present

## 2023-08-29 DIAGNOSIS — I5022 Chronic systolic (congestive) heart failure: Secondary | ICD-10-CM

## 2023-08-29 DIAGNOSIS — I959 Hypotension, unspecified: Secondary | ICD-10-CM | POA: Diagnosis not present

## 2023-08-29 DIAGNOSIS — I5043 Acute on chronic combined systolic (congestive) and diastolic (congestive) heart failure: Secondary | ICD-10-CM | POA: Diagnosis not present

## 2023-08-29 DIAGNOSIS — I5023 Acute on chronic systolic (congestive) heart failure: Secondary | ICD-10-CM | POA: Diagnosis not present

## 2023-08-29 LAB — CBC
HCT: 39.8 % (ref 36.0–46.0)
Hemoglobin: 13.4 g/dL (ref 12.0–15.0)
MCH: 30.8 pg (ref 26.0–34.0)
MCHC: 33.7 g/dL (ref 30.0–36.0)
MCV: 91.5 fL (ref 80.0–100.0)
Platelets: 176 K/uL (ref 150–400)
RBC: 4.35 MIL/uL (ref 3.87–5.11)
RDW: 16.1 % — ABNORMAL HIGH (ref 11.5–15.5)
WBC: 4.8 K/uL (ref 4.0–10.5)
nRBC: 0 % (ref 0.0–0.2)

## 2023-08-29 LAB — BASIC METABOLIC PANEL WITH GFR
Anion gap: 8 (ref 5–15)
BUN: 20 mg/dL (ref 6–20)
CO2: 29 mmol/L (ref 22–32)
Calcium: 9.5 mg/dL (ref 8.9–10.3)
Chloride: 99 mmol/L (ref 98–111)
Creatinine, Ser: 1.05 mg/dL — ABNORMAL HIGH (ref 0.44–1.00)
GFR, Estimated: >60 mL/min (ref >60)
Glucose, Bld: 102 mg/dL — ABNORMAL HIGH (ref 70–99)
Potassium: 3.3 mmol/L — ABNORMAL LOW (ref 3.5–5.1)
Sodium: 136 mmol/L (ref 135–145)

## 2023-08-29 LAB — TROPONIN I (HIGH SENSITIVITY): Troponin I (High Sensitivity): 3 ng/L (ref <18)

## 2023-08-29 LAB — MAGNESIUM: Magnesium: 2.2 mg/dL (ref 1.7–2.4)

## 2023-08-29 MED ORDER — POTASSIUM CHLORIDE CRYS ER 20 MEQ PO TBCR
40.0000 meq | EXTENDED_RELEASE_TABLET | ORAL | Status: AC
  Administered 2023-08-29 (×2): 40 meq via ORAL
  Filled 2023-08-29 (×2): qty 2

## 2023-08-29 NOTE — Consult Note (Signed)
 Advanced Heart Failure Team Consult Note   Primary Physician: Valora Agent, MD Cardiologist:  Redell Cave, MD  Reason for Consultation: A/CHFrEF  HPI:    Lisa Fuentes is seen today for evaluation of A/C HFrEF at the request of Dr. Cleatus, [facility].   Lisa Fuentes is a 63 y.o. female with a hx of systolic HF due to NICM, VT s/p MDT ICD morbid obesity, hypertension, PE s/p endovascular thrombolysis 9/24 on Eliquis , OSA and RUE DVT (06/23). Diagnosed with systolic HF in 2000. Cath 2003 no CAD. Had repeat cath 2022 with normal cors.   Had RUE DVT 6/23 after trauma. Upper extremity u/s with multiple RUE DVTs. CTA chest LLL segmental and subsegmental pulmonary emboli without evidence of RV strain. Started on Eliquis . She completed several months of Eliquis , and it was then discontinued per hematology.   Admitted 10/01/2022. CTA chest with moderate volume of acute PE with RV strain. She was started on heparin . She underwent pulmonary thrombectomy. D/c'd on Eliquis . Nuclear stress test on 10/03/2022,  LVEF 44% with moderate fixed basal septal defect without ischemia. Echo 10/03/22 EF 30-35% mild TR. Was in the ED 10/10/22 due to mid-sternal chest pain and shortness of breath. Labs normal.   Had chest CT 5/25 No PE  She presented to ED with SOB and severe exertional fatigue. Says it has progressed to the point where she gets SOB taking a shower. Denies edema, orthopnea or PND. In ED, ecg ok. BNP 49 hstrop 3 on multiple occasions. Has received iv lasix  but she feels she is dry and urine remains dark   Echo today EF 30-35% RV ok G1DD Personally reviewed   Cardiac studies reviewed:  Echo 02/18/14: EF 40-45% with Grade I DD Echo 02/22/17: EF 45-50% with Grade I DD Echo 03/24/19: EF 45-50% with mild LVH, Grade I DD, borderline aortic root dilatation 37 mm Echo 03/21/20: EF 40-45% with Grade I DD, borderline aortic root dilatation 37 mm Echo 10/03/22: EF 30-35% with moderate LVH,  Grade I DD, mild LAE   LHC 03/28/20:  Normal coronary arteries.  Left ventricular angiography was not performed.  EF was mildly reduced by echo. Mildly elevated left ventricular end-diastolic pressure 20 mmHg.   She was scheduled for PYP scan but it looks like she had a nuclear stress test instead on 01/09/23 EF 40%  Home Medications Prior to Admission medications   Medication Sig Start Date End Date Taking? Authorizing Provider  Acetaminophen  (TYLENOL  ARTHRITIS PAIN PO) Take 1,300 mg by mouth every 6 (six) hours as needed (Pain).   Yes [provider]  apixaban  (ELIQUIS ) 5 MG TABS tablet Take 5 mg by mouth 2 (two) times daily.   Yes [provider]  digoxin  (LANOXIN ) 0.125 MG tablet Take 1 tablet (0.125 mg total) by mouth daily. 07/19/23  Yes Sabharwal, Aditya, DO  ENTRESTO  24-26 MG Take 1 tablet by mouth 2 (two) times daily. 06/19/22  Yes [provider]  esomeprazole (NEXIUM) 40 MG capsule Take 40 mg by mouth every morning.    Yes [provider]  furosemide  (LASIX ) 20 MG tablet Take one tablet (20mg ) by mouth Monday, Wednesday, Friday. 02/11/23  Yes Cave Redell, MD  gabapentin  (NEURONTIN ) 100 MG capsule Take 100 mg by mouth 3 (three) times daily. 02/16/21  Yes [provider]  levothyroxine  (SYNTHROID ) 125 MCG tablet Take 125 mcg by mouth every morning. 02/27/21  Yes [provider]  metoprolol  succinate (TOPROL -XL) 50 MG 24 hr tablet Take  1 tablet (50 mg total) by mouth daily. Take with or immediately following a meal. 04/16/23  Yes Agbor-Etang, Redell, MD  oxyCODONE  (OXY IR/ROXICODONE ) 5 MG immediate release tablet Take 5 mg by mouth every 4 (four) hours as needed for moderate pain (pain score 4-6). 04/29/23  Yes [provider]  oxyCODONE -acetaminophen  (PERCOCET/ROXICET) 5-325 MG tablet Take 1 tablet by mouth every 4 (four) hours as needed for severe pain.   Yes [provider]  sertraline  (ZOLOFT ) 100 MG tablet Take 200  mg by mouth daily. 03/04/20  Yes [provider]  spironolactone  (ALDACTONE ) 25 MG tablet Take 1 tablet (25 mg total) by mouth daily. 11/19/22  Yes Sabharwal, Aditya, DO  tirzepatide  (MOUNJARO ) 5 MG/0.5ML Pen Inject 5 mg into the skin once a week. Patient taking differently: Inject 5 mg into the skin once a week. On mondays 08/15/23  Yes Glenora Morocho, Toribio SAUNDERS, MD  Magnesium  250 MG TABS Take 1 tablet by mouth daily. Patient not taking: Reported on 08/27/2023    [provider]    Past Medical History: Past Medical History:  Diagnosis Date   AICD (automatic cardioverter/defibrillator) present 2008   a.) Medtronic device placed 2008. b.) replaced 06/21/2013 (Medtronic Evera XT single-chamber ICD; model number VRDVBB1D1).   Anemia    Anxiety    Aortic dilatation (HCC) 03/24/2019   a.) TTE 03/24/2019: Ao root measured 37 mm. b.) TTE 03/21/2020: Ao root measured 37 mm.   Arthritis    Depression    Difficult intubation    DOE (dyspnea on exertion)    Endometriosis of vagina 09/2013   Intra-operative findings of endometriosis implants on cervical stump   Essential hypertension    GERD (gastroesophageal reflux disease)    Headache    HFrEF (heart failure with reduced ejection fraction) (HCC)    a.) TTE 02/18/2014: EF 40-45%; tiv AR, mild MR/TR/PR; G1DD. b.) TTE 02/22/2017: EF 45-50%; mild LVH; triv PR; G1DD. c.) TTE 03/24/2019: EF 45-50%; glob HK, mild LVH; triv MR/TR/PR. d.) TTE 03/21/2020: EF 40-45%; glob HK.   History of 2019 novel coronavirus disease (COVID-19) 02/02/2020   History of 2019 novel coronavirus disease (COVID-19) 02/02/2020   Hypokalemia    Hypothyroidism    a.) s/p radioactive iodine  Tx; on levothyroxine    Mixed hyperlipidemia    Nonischemic cardiomyopathy (HCC)    a.) TTE 02/18/2014: EF 40-45%. b.) TTE 02/22/2017: EF 45-50%. c.) TTE 03/24/2019: EF 45-50%. d.) TTE 03/21/2020: EF 40-45%   OSA on CPAP    Panic attacks    PSVT (paroxysmal supraventricular  tachycardia) (HCC) 03/30/2020   a.) Holter 03/30/2020 --> 2 runs; fastest lasting 4 beats (184 bpm); longest lasting 4 beats (113 bpm)   Pulmonary embolism (HCC)    Tachycardia     Past Surgical History: Past Surgical History:  Procedure Laterality Date   ANTERIOR AND POSTERIOR REPAIR N/A 11/25/2017   Procedure: ANTERIOR (CYSTOCELE) AND POSTERIOR REPAIR (RECTOCELE);  Surgeon: Connell Davies, MD;  Location: [facility];  Service: Gynecology;  Laterality: N/A;   BREAST CYST ASPIRATION Left 03/22/2014   neg/ done by Dr Dellie   CARDIAC CATHETERIZATION  2003   [facility]: No significant coronary artery disease with reduced ejection fraction.   CARDIAC DEFIBRILLATOR PLACEMENT  04/2006   replaced 05/2013   CARDIAC DEFIBRILLATOR PLACEMENT  06/21/2013   Procedure:  CARDIAC DEFIBRILLATOR PLACEMENT (Medtronic single-chamber ICD, model number Evera XT, model number [id number]): Location: [facility]; Surgeon: Franky Ned, MD   CARPAL TUNNEL RELEASE Right    COLONOSCOPY  2015   COLONOSCOPY N/A 10/13/2020   Procedure: COLONOSCOPY;  Surgeon: Onita Elspeth Sharper, DO;  Location: [facility];  Service: Gastroenterology;  Laterality: N/A;   COLONOSCOPY WITH PROPOFOL  N/A 10/04/2015   Procedure: COLONOSCOPY WITH PROPOFOL ;  Surgeon: Rogelia Copping, MD;  Location: [facility] ENDOSCOPY;  Service: Endoscopy;  Laterality: N/A;   CORONARY ANGIOPLASTY     CYSTOSCOPY  11/15/2014   Procedure: CYSTOSCOPY;  Surgeon: Archie Savers, MD;  Location: [facility];  Service: Gynecology;;   DIAGNOSTIC LAPAROSCOPY     ETHMOIDECTOMY Right 07/19/2021   Procedure: TOTAL ETHMOIDECTOMY WITH FRONTAL SINUS EXPLORATION;  Surgeon: Edda Mt, MD;  Location: [facility];  Service: ENT;  Laterality: Right;   FOOT SURGERY Bilateral    heer spur and bunion   IMAGE GUIDED SINUS SURGERY N/A 07/19/2021   Procedure: IMAGE GUIDED SINUS SURGERY;  Surgeon: Edda Mt, MD;  Location: [facility];  Service: ENT;  Laterality: N/A;   KNEE ARTHROSCOPY Left  03/02/2015   Procedure: ARTHROSCOPY  LEFT KNEE, PARTIAL LATERAL  MENISECTOMY, SYNOVECTOMY, MEDIAL & LATERAL CHONDROPLASTY;  Surgeon: Franky Cranker, MD;  Location: [facility];  Service: Orthopedics;  Laterality: Left;   LAPAROSCOPIC SALPINGO OOPHERECTOMY Left 11/15/2014   Procedure: LAPAROSCOPIC OOPHORECTOMY;  Surgeon: Archie Savers, MD;  Location: [facility];  Service: Gynecology;  Laterality: Left;   LAPAROSCOPIC SALPINGOOPHERECTOMY Right 09/2013   also with left salpingectomy   LEFT HEART CATH AND CORONARY ANGIOGRAPHY N/A 03/28/2020   Procedure: LEFT HEART CATH AND CORONARY ANGIOGRAPHY;  Surgeon: Darron Deatrice LABOR, MD;  Location: [facility] INVASIVE [facility];  Service: Cardiovascular;  Laterality: N/A;   MAXILLARY ANTROSTOMY Right 07/19/2021   Procedure: MAXILLARY ANTROSTOMY WITH REMOVAL TISSUE;  Surgeon: Edda Mt, MD;  Location: [facility];  Service: ENT;  Laterality: Right;   MINOR HEMORRHOIDECTOMY     NASAL TURBINATE REDUCTION Bilateral 07/19/2021   Procedure: INFERIOR TURBINATE REDUCTION;  Surgeon: Edda Mt, MD;  Location: [facility];  Service: ENT;  Laterality: Bilateral;   PULMONARY THROMBECTOMY Bilateral 10/03/2022   Procedure: PULMONARY THROMBECTOMY;  Surgeon: Marea Selinda RAMAN, MD;  Location: [facility];  Service: Cardiovascular;  Laterality: Bilateral;   SEPTOPLASTY N/A 07/19/2021   Procedure: SEPTOPLASTY;  Surgeon: Edda Mt, MD;  Location: [facility];  Service: ENT;  Laterality: N/A;   TONSILLECTOMY     TOTAL ABDOMINAL HYSTERECTOMY     TRACHELECTOMY N/A 11/15/2014   Procedure: TRACHELECTOMY;  Surgeon: Archie Savers, MD;  Location: [facility];  Service: Gynecology;  Laterality: N/A;   TRIGGER FINGER RELEASE Right    TUBAL LIGATION Bilateral     Family History: Family History  Problem Relation Age of Onset   Hypertension Mother    Hyperlipidemia Mother    Stroke Mother    Colon cancer Mother    Breast cancer Sister 19   Ovarian cancer Sister    Throat cancer Brother      Social History: Social History   Socioeconomic History   Marital status: Married    Spouse name: Jimmy   Number of children: 2   Years of education: Not on file   Highest education level: Not on file  Occupational History   Not on file  Tobacco Use   Smoking status: Never   Smokeless tobacco: Never  Vaping Use   Vaping status: Never Used  Substance and Sexual Activity   Alcohol use: Yes    Alcohol/week: 0.0 standard drinks of alcohol    Comment: occassionally   Drug use: No   Sexual activity: Not Currently  Birth control/protection: Surgical  Other Topics Concern   Not on file  Social History Narrative   Not on file   Social Drivers of Health   Financial Resource Strain: Low Risk  (03/11/2023)   Received from [facility]   Overall Financial Resource Strain (CARDIA)    Difficulty of Paying Living Expenses: Not hard at all  Food Insecurity: No Food Insecurity (08/27/2023)   Hunger Vital Sign    Worried About Running Out of Food in the Last Year: Never true    Ran Out of Food in the Last Year: Never true  Transportation Needs: No Transportation Needs (08/27/2023)   PRAPARE - Administrator, Civil Service (Medical): No    Lack of Transportation (Non-Medical): No  Physical Activity: Not on file  Stress: Not on file  Social Connections: Not on file    Allergies:  Allergies  Allergen Reactions   Sulfa Antibiotics Hives   Sulfamethoxazole-Trimethoprim Hives   Jardiance  [Empagliflozin ] Rash    Yeast rash    Objective:    Vital Signs:   Temp:  [97.8 F (36.6 C)-98.7 F (37.1 C)] 98.5 F (36.9 C) (07/31 1111) Pulse Rate:  [80-90] 88 (07/31 1111) Resp:  [16-20] 18 (07/31 1111) BP: (90-111)/(65-74) 96/65 (07/31 1111) SpO2:  [90 %-99 %] 95 % (07/31 1111) FiO2 (%):  [21 %] 21 % (07/30 2320) Weight:  [99.3 kg] 99.3 kg (07/31 0500) Last BM Date : 08/28/23  Weight change: Filed Weights   08/27/23 1007 08/28/23 0500 08/29/23 0500   Weight: 110.7 kg 97.6 kg 99.3 kg    Intake/Output:   Intake/Output Summary (Last 24 hours) at 08/29/2023 1243 Last data filed at 08/29/2023 0440 Gross per 24 hour  Intake 360 ml  Output 650 ml  Net -290 ml      Physical Exam    General:  Well appearing. No resp difficulty HEENT: normal Neck: supple. no JVD. Carotids 2+ bilat; no bruits. No lymphadenopathy or thryomegaly appreciated. Cor: PMI nondisplaced. Regular rate & rhythm. No rubs, gallops or murmurs. Lungs: clear Abdomen: soft, nontender, nondistended. No hepatosplenomegaly. No bruits or masses. Good bowel sounds. Extremities: no cyanosis, clubbing, rash, edema Neuro: alert & orientedx3, cranial nerves grossly intact. moves all 4 extremities w/o difficulty. Affect pleasant   Telemetry   Sinus 80s Personally reviewed  EKG    NSR 78 bpm nonspecific ST-Ts Personally reviewed  Labs   Basic Metabolic Panel: Recent Labs  Lab 08/27/23 1010 08/27/23 1735 08/28/23 0318 08/29/23 0216  NA 137 136 136 136  K 3.2* 3.0* 3.5 3.3*  CL 95* 94* 98 99  CO2 25 28 27 29   GLUCOSE 103* 101* 112* 102*  BUN 24* 23* 24* 20  CREATININE 1.32* 1.13* 1.15* 1.05*  CALCIUM 10.5* 9.8 9.8 9.5  MG 1.9  --   --  2.2    Liver Function Tests: No results for input(s): AST, ALT, ALKPHOS, BILITOT, PROT, ALBUMIN in the last 168 hours. No results for input(s): LIPASE, AMYLASE in the last 168 hours. No results for input(s): AMMONIA in the last 168 hours.  CBC: Recent Labs  Lab 08/27/23 1010 08/29/23 0216  WBC 5.6 4.8  HGB 15.8* 13.4  HCT 47.8* 39.8  MCV 91.9 91.5  PLT 207 176    Cardiac Enzymes: No results for input(s): CKTOTAL, CKMB, CKMBINDEX, TROPONINI in the last 168 hours.  BNP: BNP (last 3 results) Recent Labs    04/12/23 1511 07/31/23 0857 08/27/23 1010  BNP 36.1 72.8  49.2    ProBNP (last 3 results) No results for input(s): PROBNP in the last 8760 hours.   CBG: No results for  input(s): GLUCAP in the last 168 hours.  Coagulation Studies: No results for input(s): LABPROT, INR in the last 72 hours.   Imaging   No results found.   Medications:     Current Medications:  apixaban   5 mg Oral BID   furosemide   40 mg Intravenous BID   gabapentin   100 mg Oral TID   levothyroxine   125 mcg Oral Q0600   losartan   12.5 mg Oral Daily   metoprolol  succinate  12.5 mg Oral Daily   midodrine   5 mg Oral BID WC   pantoprazole   40 mg Oral Daily   potassium chloride   40 mEq Oral Q4H   sertraline   200 mg Oral Daily    Infusions:     Patient Profile   JASSLYN FINKEL is a 63 y.o. AAF with systolic HF due to NICM, VT s/p MDT ICD morbid obesity, hypertension, PE s/p endovascular thrombolysis 9/24 on Eliquis , OSA and RUE DVT (06/23). AHF team to see with A/C HFrEF.   Assessment/Plan   Acute on chronic HFrEF, NiCM - See cardiac studies as above in HPI - Echo 08/28/23: EF 30-35%, no RWMA, GIDD, RV normal, no MR - NYHA IIIb - Volume status is low - Off MRA with hypotension and now on midodrine .  - Off Entresto , now on losartan  12.5 mg daily. Will stop with need for midodrine  - Continue metoprolol  succinate 12.5 mg daily - Off SGLT2i; she reports having a very severe yeast infections - Volume status looks very good.  - Symptoms seem out of proportion to clinical findings. Labs and exam very reassuring.  - I think it would be OK to discharge her and continue with CPX testing as outpatient. If CPX testing concerning for cardiac limitation would proceed with RHC - There has been concern over potential infiltrative process. Can get PYP - Discussed with TRH. I  will arrange outpatient f/u and s/o   Hx VT - s/p MDT ICD - No recent VT  Hypotension - Now on midodrine  - no change  AKI - Scr 1.32 on admission - Baseline SCr 0.08.  - Back to close to baseline, 1.08 today - avoid hypotension  Hx recurrent DVT/PE - s/p thrombectomy - CT chest 5/25 No  evidence chronic PE. RVSP normal on echo today - continue lifelong Eliquis  -> after 1 year can decrease to 2.5 bid per [employer] data - No evidence of RV strain on echo   DMII - Per primary team - Would update A1c. Last A1c 5.9 02/28/22  Toribio Fuel, MD  5:14 PM   Advanced Heart Failure Team Pager [phone] (M-F; 7a - 5p)  Please contact [facility] for night-coverage after hours (4p -7a ) and weekends on [email or web address]

## 2023-08-29 NOTE — Progress Notes (Signed)
 Orders place for outpatient cpx per Dr. Bensimhon

## 2023-08-29 NOTE — Plan of Care (Signed)
   Problem: Education: Goal: Knowledge of General Education information will improve Description Including pain rating scale, medication(s)/side effects and non-pharmacologic comfort measures Outcome: Progressing

## 2023-08-29 NOTE — Progress Notes (Addendum)
       CROSS COVER NOTE  NAME: Lisa Fuentes MRN: [id number] DOB : 1960/08/05    Concern as stated by nurse / staff   patient complaining of chest pain. described as pressure then stabbing that radiated to her left jaw. lasted 3-5 minutes. EKG complete and available in chart. bp 100/71 map 81 hr 85. 100% on room air. pain 8/10 at the time but gone away.      Pertinent findings on chart review: HFrEF secondary to NICM with history of V. tach s/p AICD   Patient Assessment Patient pain-free at the time of my assessment    08/28/2023   11:40 PM 08/28/2023   10:17 PM 08/28/2023    7:45 PM  Vitals with BMI  Systolic 90 100 106  Diastolic 68 71 74  Pulse 82 85 85   Physical Exam Vitals and nursing note reviewed.  Constitutional:      General: She is not in acute distress. HENT:     Head: Normocephalic and atraumatic.  Cardiovascular:     Rate and Rhythm: Normal rate and regular rhythm.     Heart sounds: Normal heart sounds.  Pulmonary:     Effort: Pulmonary effort is normal.     Breath sounds: Normal breath sounds.  Abdominal:     Palpations: Abdomen is soft.     Tenderness: There is no abdominal tenderness.  Neurological:     Mental Status: Mental status is at baseline.      Workup Cardiac Panel (last 3 results) Recent Labs    08/27/23 1735 08/28/23 2252 08/29/23 0021  TROPONINIHS 3 3 3    EKG showing normal T wave changes   Assessment and  Interventions   Assessment:  Chest pain, probably noncardiac  Plan: Continue to monitor      CRITICAL CARE Performed by: Delayne LULLA Solian   Total critical care time: 35 minutes  Critical care time was exclusive of separately billable procedures and treating other patients.  Critical care was necessary to treat or prevent imminent or life-threatening deterioration.  Critical care was time spent personally by me on the following activities: development of treatment plan with patient and/or surrogate as well as  nursing, discussions with consultants, evaluation of patient's response to treatment, examination of patient, obtaining history from patient or surrogate, ordering and performing treatments and interventions, ordering and review of laboratory studies, ordering and review of radiographic studies, pulse oximetry and re-evaluation of patient's condition.

## 2023-08-29 NOTE — Progress Notes (Signed)
  PROGRESS NOTE    Lisa Fuentes  FMW:[id number] DOB: Mar 16, 1960 DOA: 08/27/2023 PCP: Valora Agent, MD  [id number]  LOS: 0 days   Brief hospital course: Lisa Fuentes is a 63 y.o. female with medical history significant of nonischemic cardiomyopathy, combined CHF with EF 25-30% on last Echo in 11/2022, Vtach with Medtronic ICD, history of DVT/PE on Eliquis , OSA on CPAP, diabetes non-insulin dependent, HTN who presented to the ED today for evaluation of worsening shortness of breath and fatigue. Reports gradual worsening over past couple of weeks, got significantly worse past couple of days.  Associated lower extremity edema.  Dyspnea on even minimal exertion.   Assessment & Plan:  Acute on chronic combined CHF  Nonischemic Cardiomyopathy Pt presents with progressive dyspnea and fatigue over weeks, lower extremity edema, some non-compliance with lasix  possibly contributing. EF in November 2024 was 25-30%, grade I dd. Follows at [facility]. HF clinic visit 07/19/23 reviewed -- started on digoxin  0.125 mg daily Given lasix  60 mg IV x 1 in the ED f/b IV lasix  40 BID.  Has not had much urine output. Current Echo LVEF 30-35%. --consulted Heart failure.  Per cardio, Volume status looks very good.  Symptoms seem out of proportion to clinical findings. Labs and exam very reassuring. --d/c IV lasix  --resume Toprol   Hypotension --cont midodrine  (new)   AKI  Cr on admission 1.32, up from 0.88 on 07/31/23 --monitor Cr with diuresis   Hx of Vtach s/p AICD --monitor on tele   Hx of recurrent DVT/PE --Continue Eliquis  5 mg BID   Non-insulin dependent Diabetes Type 2 --Hold Mounjaro  --no need for BG checks   Depression  --Continue Zoloft    GERD --Protonix  for home Nexium   Chronic pain on chronic opioids --cont home percocet   Hypokalemia --monitor and supplement PRN   DVT prophylaxis: On:Eliquis  Code Status: Full code  Family Communication:   Level of care: Telemetry Cardiac Dispo:   The patient is from: home Anticipated d/c is to: home Anticipated d/c date is: tomorrow   Subjective and Interval History:  Pt continued to have DOE, specifically mentioned while taking a shower.  Reported dark urine output.   Objective: Vitals:   08/29/23 0500 08/29/23 0729 08/29/23 1111 08/29/23 1457  BP:  111/67 96/65 96/70   Pulse:  86 88 83  Resp:  16 18 16   Temp:  98.2 F (36.8 C) 98.5 F (36.9 C) 97.9 F (36.6 C)  TempSrc:   Oral Oral  SpO2:  90% 95% 98%  Weight: 99.3 kg     Height:        Intake/Output Summary (Last 24 hours) at 08/29/2023 1859 Last data filed at 08/29/2023 1400 Gross per 24 hour  Intake 120 ml  Output 850 ml  Net -730 ml   Filed Weights   08/27/23 1007 08/28/23 0500 08/29/23 0500  Weight: 110.7 kg 97.6 kg 99.3 kg    Examination:   Constitutional: NAD, AAOx3 HEENT: conjunctivae and lids normal, EOMI CV: No cyanosis.   RESP: normal respiratory effort, on RA Neuro: II - XII grossly intact.   Psych: Normal mood and affect.  Appropriate judgement and reason   Data Reviewed: I have personally reviewed labs and imaging studies  Time spent: 35 minutes  Ellouise Haber, MD [facility] If 7PM-7AM, please contact night-coverage 08/29/2023, 6:59 PM

## 2023-08-30 DIAGNOSIS — I5043 Acute on chronic combined systolic (congestive) and diastolic (congestive) heart failure: Secondary | ICD-10-CM | POA: Diagnosis not present

## 2023-08-30 LAB — BASIC METABOLIC PANEL WITH GFR
Anion gap: 8 (ref 5–15)
BUN: 17 mg/dL (ref 6–20)
CO2: 25 mmol/L (ref 22–32)
Calcium: 9.6 mg/dL (ref 8.9–10.3)
Chloride: 101 mmol/L (ref 98–111)
Creatinine, Ser: 0.9 mg/dL (ref 0.44–1.00)
GFR, Estimated: >60 mL/min (ref >60)
Glucose, Bld: 95 mg/dL (ref 70–99)
Potassium: 3.8 mmol/L (ref 3.5–5.1)
Sodium: 134 mmol/L — ABNORMAL LOW (ref 135–145)

## 2023-08-30 LAB — TSH: TSH: 35.061 u[IU]/mL — ABNORMAL HIGH (ref 0.350–4.500)

## 2023-08-30 LAB — T4, FREE: Free T4: 0.68 ng/dL (ref 0.61–1.12)

## 2023-08-30 MED ORDER — DIGOXIN 125 MCG PO TABS: 0.0625 mg | ORAL_TABLET | Freq: Every day | ORAL | Status: AC

## 2023-08-30 MED ORDER — OXYCODONE-ACETAMINOPHEN 5-325 MG PO TABS: 1.0000 | ORAL_TABLET | Freq: Three times a day (TID) | ORAL | Status: AC | PRN

## 2023-08-30 MED ORDER — MIDODRINE HCL 5 MG PO TABS: 5.0000 mg | ORAL_TABLET | Freq: Two times a day (BID) | ORAL | 2 refills | Status: DC

## 2023-08-30 MED ORDER — METOPROLOL SUCCINATE ER 25 MG PO TB24: 12.5000 mg | ORAL_TABLET | Freq: Every day | ORAL | 2 refills | Status: DC

## 2023-08-30 NOTE — Progress Notes (Signed)
 Transition of Care [facility]) - Inpatient Brief Assessment   Patient Details  Name: Lisa Fuentes MRN: [id number] Date of Birth: 07/04/1960  [facility] CM/SW Contact:    Noura Purpura C Rogene Meth, RN Phone Number: 08/30/2023, 9:35 AM   Clinical Narrative: TOC continuing to follow patient's progress throughout discharge planning.   Transition of Care Asessment: Insurance and Status: Insurance coverage has been reviewed Patient has primary care physician: Yes   Prior level of function:: Independent Prior/Current Home Services: No current home services Social Drivers of Health Review: SDOH reviewed no interventions necessary Readmission risk has been reviewed: Yes Transition of care needs: no transition of care needs at this time

## 2023-08-30 NOTE — Discharge Summary (Signed)
 Physician Discharge Summary   Lisa Fuentes  female DOB: 07-17-1960  FMW:[id number]  PCP: Valora Agent, MD  Admit date: 08/27/2023 Discharge date: 08/30/2023  Admitted From: home Disposition:  home CODE STATUS: Full code  Discharge Instructions     Diet - low sodium heart healthy   Complete by: As directed       Hospital Course:  For full details, please see H&P, progress notes, consult notes and ancillary notes.  Briefly,  Lisa Fuentes is a 63 y.o. female with medical history significant of nonischemic cardiomyopathy, combined CHF with EF 25-30% on last Echo in 11/2022, Vtach with [employer] ICD, history of DVT/PE on Eliquis , OSA on CPAP, diabetes non-insulin dependent, HTN who presented to the ED for evaluation of worsening shortness of breath and fatigue, associated lower extremity edema, dyspnea on even minimal exertion.    Acute on chronic combined CHF  Nonischemic Cardiomyopathy --reported dyspnea on exertion, but no hypoxia and did not appear to be in respiratory distress. --Follows at [facility].  Given lasix  60 mg IV x 1 in the ED f/b IV lasix  40 BID.  Had not had much urine output. Current Echo LVEF 30-35%. --consulted Heart failure.  Per cardio, Volume status looks very good.  Symptoms seem out of proportion to clinical findings. Labs and exam very reassuring. --discharged on home lasix  and aldactone . --home Toprol  reduced from 50 to 12.5 mg daily. --hold home entresto  due to low BP. --started on midodrine  due to low BP. --home digoxin  reduced from 0.125 to 0.0625 mg daily.   Hypotension --started on midodrine    AKI  Cr on admission 1.32, up from 0.88 on 07/31/23.  Cr improved with diuresis to 0.9 prior to discharge.   Hx of Vtach s/p AICD   Hx of recurrent DVT/PE --Continue Eliquis  5 mg BID   Non-insulin dependent Diabetes Type 2 --resume home Mounjaro  after discharge   Depression  --Continue Zoloft    GERD --cont PPI    Chronic pain on chronic opioids --cont home percocet    Hypokalemia --monitored and supplemented PRN   Unless noted above, medications under STOP list are ones pt was not taking PTA.  Discharge Diagnoses:  Principal Problem:   Acute on chronic combined systolic and diastolic congestive heart failure (HCC) Active Problems:   Hypotension   Acute renal failure (HCC)   Weakness   Other fatigue     Discharge Instructions:  Allergies as of 08/30/2023       Reactions   Sulfa Antibiotics Hives   Sulfamethoxazole-trimethoprim Hives   Jardiance  [empagliflozin ] Rash   Yeast rash        Medication List     PAUSE taking these medications    Entresto  24-26 MG Wait to take this until your doctor or other care provider tells you to start again. Due to low blood pressure. Generic drug: sacubitril -valsartan  Take 1 tablet by mouth 2 (two) times daily.       STOP taking these medications    Magnesium  250 MG Tabs   oxyCODONE  5 MG immediate release tablet Commonly known as: Oxy IR/ROXICODONE        TAKE these medications    digoxin  0.125 MG tablet Commonly known as: LANOXIN  Take 0.5 tablets (0.0625 mg total) by mouth daily. Reduced from 0.125 mg. What changed:  how much to take additional instructions   Eliquis  5 MG Tabs tablet Generic drug: apixaban  Take 5 mg by mouth 2 (two) times daily.   esomeprazole 40 MG  capsule Commonly known as: NEXIUM Take 40 mg by mouth every morning.   furosemide  20 MG tablet Commonly known as: LASIX  Take one tablet (20mg ) by mouth Monday, Wednesday, Friday.   gabapentin  100 MG capsule Commonly known as: NEURONTIN  Take 100 mg by mouth 3 (three) times daily.   levothyroxine  125 MCG tablet Commonly known as: SYNTHROID  Take 125 mcg by mouth every morning.   metoprolol  succinate 25 MG 24 hr tablet Commonly known as: TOPROL -XL Take 0.5 tablets (12.5 mg total) by mouth daily. Reduced from 50 mg daily. What changed:   medication strength how much to take additional instructions   midodrine  5 MG tablet Commonly known as: PROAMATINE  Take 1 tablet (5 mg total) by mouth 2 (two) times daily with a meal.   Mounjaro  5 MG/0.5ML Pen Generic drug: tirzepatide  Inject 5 mg into the skin once a week. What changed: additional instructions   oxyCODONE -acetaminophen  5-325 MG tablet Commonly known as: PERCOCET/ROXICET Take 1 tablet by mouth 3 (three) times daily as needed for severe pain (pain score 7-10). Home med. What changed:  when to take this additional instructions   sertraline  100 MG tablet Commonly known as: ZOLOFT  Take 200 mg by mouth daily.   spironolactone  25 MG tablet Commonly known as: ALDACTONE  Take 1 tablet (25 mg total) by mouth daily.   TYLENOL  ARTHRITIS PAIN PO Take 1,300 mg by mouth every 6 (six) hours as needed (Pain).         Follow-up Information     Valora Agent, MD Follow up in 1 week(s).   Specialty: Family Medicine Contact information: [address] [facility] [place] [address] [phone]         Bensimhon, Toribio SAUNDERS, MD Follow up.   Specialty: Cardiology Contact information: [address] [place] [address] [phone]                 Allergies  Allergen Reactions   Sulfa Antibiotics Hives   Sulfamethoxazole-Trimethoprim Hives   Jardiance  [Empagliflozin ] Rash    Yeast rash     The results of significant diagnostics from this hospitalization (including imaging, microbiology, ancillary and laboratory) are listed below for reference.   Consultations:   Procedures/Studies: ECHOCARDIOGRAM COMPLETE Result Date: 08/28/2023    ECHOCARDIOGRAM REPORT   Patient Name:   Lisa Fuentes Date of Exam: 08/28/2023 Medical Rec #:  [id number]        Height:       68.0 in Accession #:    [id number]       Weight:       215.2 lb Date of Birth:  January 30, 1960         BSA:          2.108 m Patient Age:    62 years         BP:            99/74 mmHg Patient Gender: F                HR:           92 bpm. Exam Location:  [facility] Procedure: 2D Echo, Cardiac Doppler and Color Doppler (Both Spectral and Color            Flow Doppler were utilized during procedure). Indications:     CHF--acute systolic I50.21  History:         Patient has prior history of Echocardiogram examinations, most  recent 12/13/2022. AICD; Risk Factors:Hypertension. Heart                  failure with reduced ejection fraction.  Sonographer:     Christopher Furnace Referring Phys:  [id number] BURNARD A GRIFFITH Diagnosing Phys: Evalene Lunger MD IMPRESSIONS  1. Left ventricular ejection fraction, by estimation, is 30 to 35%. The left ventricle has moderately decreased function. The left ventricle has no regional wall motion abnormalities. Left ventricular diastolic parameters are consistent with Grade I diastolic dysfunction (impaired relaxation).  2. Right ventricular systolic function is normal. The right ventricular size is normal. There is normal pulmonary artery systolic pressure. The estimated right ventricular systolic pressure is 12.3 mmHg.  3. The mitral valve is normal in structure. No evidence of mitral valve regurgitation. No evidence of mitral stenosis.  4. The aortic valve is normal in structure. Aortic valve regurgitation is not visualized. No aortic stenosis is present.  5. The inferior vena cava is normal in size with greater than 50% respiratory variability, suggesting right atrial pressure of 3 mmHg. FINDINGS  Left Ventricle: Left ventricular ejection fraction, by estimation, is 30 to 35%. The left ventricle has moderately decreased function. The left ventricle has no regional wall motion abnormalities. Strain was performed and the global longitudinal strain is indeterminate. The left ventricular internal cavity size was normal in size. There is no left ventricular hypertrophy. Left ventricular diastolic parameters are consistent with Grade I diastolic  dysfunction (impaired relaxation). Right Ventricle: The right ventricular size is normal. No increase in right ventricular wall thickness. Right ventricular systolic function is normal. There is normal pulmonary artery systolic pressure. The tricuspid regurgitant velocity is 1.35 m/s, and  with an assumed right atrial pressure of 5 mmHg, the estimated right ventricular systolic pressure is 12.3 mmHg. Left Atrium: Left atrial size was normal in size. Right Atrium: Right atrial size was normal in size. Pericardium: There is no evidence of pericardial effusion. Mitral Valve: The mitral valve is normal in structure. No evidence of mitral valve regurgitation. No evidence of mitral valve stenosis. Tricuspid Valve: The tricuspid valve is normal in structure. Tricuspid valve regurgitation is not demonstrated. No evidence of tricuspid stenosis. Aortic Valve: The aortic valve is normal in structure. Aortic valve regurgitation is not visualized. No aortic stenosis is present. Aortic valve mean gradient measures 2.0 mmHg. Aortic valve peak gradient measures 4.2 mmHg. Aortic valve area, by VTI measures 2.19 cm. Pulmonic Valve: The pulmonic valve was normal in structure. Pulmonic valve regurgitation is not visualized. No evidence of pulmonic stenosis. Aorta: The aortic root is normal in size and structure. Venous: The inferior vena cava is normal in size with greater than 50% respiratory variability, suggesting right atrial pressure of 3 mmHg. IAS/Shunts: No atrial level shunt detected by color flow Doppler. Additional Comments: 3D was performed not requiring image post processing on an independent workstation and was indeterminate. A device lead is visualized.  LEFT VENTRICLE PLAX 2D LVIDd:         4.90 cm     Diastology LVIDs:         3.40 cm     LV e' medial:    5.66 cm/s LV PW:         0.90 cm     LV E/e' medial:  7.7 LV IVS:        1.00 cm     LV e' lateral:   6.85 cm/s LVOT diam:     2.10 cm  LV E/e' lateral: 6.4 LV SV:          35 LV SV Index:   16 LVOT Area:     3.46 cm  LV Volumes (MOD) LV vol d, MOD A2C: 57.5 ml LV vol d, MOD A4C: 70.0 ml LV vol s, MOD A2C: 35.5 ml LV vol s, MOD A4C: 44.9 ml LV SV MOD A2C:     22.0 ml LV SV MOD A4C:     70.0 ml LV SV MOD BP:      24.1 ml RIGHT VENTRICLE RV Basal diam:  2.40 cm RV Mid diam:    1.80 cm RV S prime:     13.40 cm/s TAPSE (M-mode): 1.8 cm LEFT ATRIUM             Index        RIGHT ATRIUM           Index LA diam:        2.30 cm 1.09 cm/m   RA Area:     11.50 cm LA Vol (A2C):   32.8 ml 15.56 ml/m  RA Volume:   25.50 ml  12.09 ml/m LA Vol (A4C):   26.1 ml 12.38 ml/m LA Biplane Vol: 30.7 ml 14.56 ml/m  AORTIC VALVE AV Area (Vmax):    2.06 cm AV Area (Vmean):   2.05 cm AV Area (VTI):     2.19 cm AV Vmax:           102.00 cm/s AV Vmean:          73.300 cm/s AV VTI:            0.158 m AV Peak Grad:      4.2 mmHg AV Mean Grad:      2.0 mmHg LVOT Vmax:         60.70 cm/s LVOT Vmean:        43.300 cm/s LVOT VTI:          0.100 m LVOT/AV VTI ratio: 0.63  AORTA Ao Root diam: 3.40 cm MITRAL VALVE               TRICUSPID VALVE MV Area (PHT): 6.22 cm    TR Peak grad:   7.3 mmHg MV Decel Time: 122 msec    TR Vmax:        135.00 cm/s MV E velocity: 43.50 cm/s MV A velocity: 89.60 cm/s  SHUNTS MV E/A ratio:  0.49        Systemic VTI:  0.10 m                            Systemic Diam: 2.10 cm Evalene Lunger MD Electronically signed by Evalene Lunger MD Signature Date/Time: 08/28/2023/12:37:57 PM    Final    DG Chest 2 View Result Date: 08/27/2023 CLINICAL DATA:  Shortness of breath. EXAM: CHEST - 2 VIEW COMPARISON:  07/14/2023. FINDINGS: The heart size and mediastinal contours are unchanged. Stable single lead ICD. No focal consolidation, pleural effusion, or pneumothorax. No acute osseous abnormality. IMPRESSION: No acute cardiopulmonary findings. Electronically Signed   By: Harrietta Sherry M.D.   On: 08/27/2023 11:38      Labs: BNP (last 3 results) Recent Labs    04/12/23 1511  07/31/23 0857 08/27/23 1010  BNP 36.1 72.8 49.2   Basic Metabolic Panel: Recent Labs  Lab 08/27/23 1010 08/27/23 1735 08/28/23 0318 08/29/23 0216 08/30/23 0300  NA 137 136 136 136 134*  K 3.2* 3.0* 3.5 3.3* 3.8  CL 95* 94* 98 99 101  CO2 25 28 27 29 25   GLUCOSE 103* 101* 112* 102* 95  BUN 24* 23* 24* 20 17  CREATININE 1.32* 1.13* 1.15* 1.05* 0.90  CALCIUM 10.5* 9.8 9.8 9.5 9.6  MG 1.9  --   --  2.2  --    Liver Function Tests: No results for input(s): AST, ALT, ALKPHOS, BILITOT, PROT, ALBUMIN in the last 168 hours. No results for input(s): LIPASE, AMYLASE in the last 168 hours. No results for input(s): AMMONIA in the last 168 hours. CBC: Recent Labs  Lab 08/27/23 1010 08/29/23 0216  WBC 5.6 4.8  HGB 15.8* 13.4  HCT 47.8* 39.8  MCV 91.9 91.5  PLT 207 176   Cardiac Enzymes: No results for input(s): CKTOTAL, CKMB, CKMBINDEX, TROPONINI in the last 168 hours. BNP: Invalid input(s): POCBNP CBG: No results for input(s): GLUCAP in the last 168 hours. D-Dimer No results for input(s): DDIMER in the last 72 hours. Hgb A1c No results for input(s): HGBA1C in the last 72 hours. Lipid Profile No results for input(s): CHOL, HDL, LDLCALC, TRIG, CHOLHDL, LDLDIRECT in the last 72 hours. Thyroid  function studies No results for input(s): TSH, T4TOTAL, T3FREE, THYROIDAB in the last 72 hours.  Invalid input(s): FREET3 Anemia work up No results for input(s): VITAMINB12, FOLATE, FERRITIN, TIBC, IRON, RETICCTPCT in the last 72 hours. Urinalysis    Component Value Date/Time   COLORURINE YELLOW (A) 05/24/2023 0421   APPEARANCEUR CLOUDY (A) 05/24/2023 0421   APPEARANCEUR Clear 06/01/2013 1003   LABSPEC 1.021 05/24/2023 0421   LABSPEC 1.010 06/01/2013 1003   PHURINE 5.0 05/24/2023 0421   GLUCOSEU NEGATIVE 05/24/2023 0421   GLUCOSEU Negative 06/01/2013 1003   HGBUR SMALL (A) 05/24/2023 0421   BILIRUBINUR NEGATIVE  05/24/2023 0421   BILIRUBINUR Negative 03/22/2021 1620   BILIRUBINUR Negative 06/01/2013 1003   KETONESUR 5 (A) 05/24/2023 0421   PROTEINUR NEGATIVE 05/24/2023 0421   UROBILINOGEN 0.2 03/22/2021 1620   NITRITE NEGATIVE 05/24/2023 0421   LEUKOCYTESUR MODERATE (A) 05/24/2023 0421   LEUKOCYTESUR Negative 06/01/2013 1003   Sepsis Labs Recent Labs  Lab 08/27/23 1010 08/29/23 0216  WBC 5.6 4.8   Microbiology No results found for this or any previous visit (from the past 240 hours).   Total time spend on discharging this patient, including the last patient exam, discussing the hospital stay, instructions for ongoing care as it relates to all pertinent caregivers, as well as preparing the medical discharge records, prescriptions, and/or referrals as applicable, is 35 minutes.    Ellouise Haber, MD  [facility] 08/30/2023, 9:38 AM

## 2023-08-30 NOTE — Progress Notes (Signed)
 Pt given dc/rx instructions. Pt advised to return to an ED as needed/worse.Pt voices understanding. Pt has bathed and dressed herself. Pt with cell phone, charger, clothing. No other valuables with pt. No distress noted with pt. Pt's ride coming for her at this time. Pt to be wheeled out.

## 2023-08-30 NOTE — Plan of Care (Signed)

## 2023-09-02 ENCOUNTER — Telehealth: Payer: Self-pay | Admitting: Cardiology

## 2023-09-02 ENCOUNTER — Encounter

## 2023-09-02 DIAGNOSIS — E0590 Thyrotoxicosis, unspecified without thyrotoxic crisis or storm: Secondary | ICD-10-CM | POA: Diagnosis not present

## 2023-09-02 NOTE — Telephone Encounter (Signed)
 Called patient - scheduled for appointment with Ag[facility]-Etang 8/8

## 2023-09-02 NOTE — Telephone Encounter (Signed)
  STAT if patient feels like he/she is going to faint   1. Are you feeling dizzy, lightheaded, or faint right now? No     2. Have you passed out?  No  (If yes move to .SYNCOPECHMG)   3. Do you have any other symptoms?    4. Have you checked your HR and BP (record if available)?    Pt said, she was at the hospital and was told her symptoms is not from fluid build up. Her pee colored like orange juice. She feel dizzy and the feel like going to past out and she have SOB on exertion

## 2023-09-04 ENCOUNTER — Encounter

## 2023-09-05 ENCOUNTER — Encounter

## 2023-09-06 ENCOUNTER — Encounter: Payer: Self-pay | Admitting: Cardiology

## 2023-09-06 ENCOUNTER — Ambulatory Visit: Attending: Cardiology | Admitting: Cardiology

## 2023-09-06 VITALS — BP 116/68 | HR 79 | Ht 67.0 in | Wt 218.0 lb

## 2023-09-06 DIAGNOSIS — I428 Other cardiomyopathies: Secondary | ICD-10-CM

## 2023-09-06 DIAGNOSIS — Z6834 Body mass index (BMI) 34.0-34.9, adult: Secondary | ICD-10-CM | POA: Diagnosis not present

## 2023-09-06 DIAGNOSIS — Z95810 Presence of automatic (implantable) cardiac defibrillator: Secondary | ICD-10-CM | POA: Diagnosis not present

## 2023-09-06 NOTE — Patient Instructions (Signed)
 Medication Instructions:  Your physician recommends that you continue on your current medications as directed. Please refer to the Current Medication list given to you today.   *If you need a refill on your cardiac medications before your next appointment, please call your pharmacy*  Lab Work: No labs ordered today  If you have labs (blood work) drawn today and your tests are completely normal, you will receive your results only by: MyChart Message (if you have MyChart) OR A paper copy in the mail If you have any lab test that is abnormal or we need to change your treatment, we will call you to review the results.  Testing/Procedures: No test ordered today   Follow-Up: At [facility], you and your health needs are our priority.  As part of our continuing mission to provide you with exceptional heart care, our providers are all part of one team.  This team includes your primary Cardiologist (physician) and Advanced Practice Providers or APPs (Physician Assistants and Nurse Practitioners) who all work together to provide you with the care you need, when you need it.  Your next appointment:   6 month(s)  Provider:   You may see Constancia Delton, MD or one of the following Advanced Practice Providers on your designated Care Team:   Laneta Pintos, NP Gildardo Labrador, PA-C Varney Gentleman, PA-C Cadence Kettering, PA-C Ronald Cockayne, NP Morey Ar, NP    We recommend signing up for the patient portal called "MyChart".  Sign up information is provided on this After Visit Summary.  MyChart is used to connect with patients for Virtual Visits (Telemedicine).  Patients are able to view lab/test results, encounter notes, upcoming appointments, etc.  Non-urgent messages can be sent to your provider as well.   To learn more about what you can do with MyChart, go to [email or web address].

## 2023-09-06 NOTE — Progress Notes (Signed)
 Cardiology Office Note:    Date:  09/06/2023   ID:  [name], DOB 1960-08-26, MRN [id number]  PCP:  Lisa Agent, MD   [facility] Providers Cardiologist:  Lisa Cave, MD Electrophysiologist:  Lisa Sage, MD     Referring MD: Lisa Agent, MD   No chief complaint on file.   History of Present Illness:    Lisa Fuentes is a 63 y.o. female with a hx of NICM EF 30 to 35%, s/p ICD 2008, GEN change 2015, hypertension, hyperlipidemia, PE s/p endovascular thrombolysis 9/24 on Eliquis , obesity, OSA presents for follow-up.  Recently admitted with symptoms of dyspnea.  Evaluated by heart failure service.  Symptoms seem out of proportion to heart failure.  Toprol -XL reduced from 50 to 12.5 mg daily.  Digoxin  reduce to 0.0625.  Midodrine  started.  Entresto  was stopped.  No SGLT2 due to history of yeast infections.  She has not taken midodrine .  BP is normal at home, states feeling better, more energetic.  Prior notes Echo 9/24 EF 30 to 35% Lexiscan  Myoview  9/24 no ischemia. Left heart cath 03/2020 no CAD Daughter also has heart failure. Not on Jardiance  due to yeast infections.  Past Medical History:  Diagnosis Date   AICD (automatic cardioverter/defibrillator) present 2008   a.) Medtronic device placed 2008. b.) replaced 06/21/2013 (Medtronic Evera XT single-chamber ICD; model number [id number]).   Anemia    Anxiety    Aortic dilatation (HCC) 03/24/2019   a.) TTE 03/24/2019: Ao root measured 37 mm. b.) TTE 03/21/2020: Ao root measured 37 mm.   Arthritis    Depression    Difficult intubation    DOE (dyspnea on exertion)    Endometriosis of vagina 09/2013   Intra-operative findings of endometriosis implants on cervical stump   Essential hypertension    GERD (gastroesophageal reflux disease)    Headache    HFrEF (heart failure with reduced ejection fraction) (HCC)    a.) TTE 02/18/2014: EF 40-45%; tiv AR, mild MR/TR/PR; G1DD. b.) TTE 02/22/2017: EF  45-50%; mild LVH; triv PR; G1DD. c.) TTE 03/24/2019: EF 45-50%; glob HK, mild LVH; triv MR/TR/PR. d.) TTE 03/21/2020: EF 40-45%; glob HK.   History of 2019 novel coronavirus disease (COVID-19) 02/02/2020   History of 2019 novel coronavirus disease (COVID-19) 02/02/2020   Hypokalemia    Hypothyroidism    a.) s/p radioactive iodine  Tx; on levothyroxine    Mixed hyperlipidemia    Nonischemic cardiomyopathy (HCC)    a.) TTE 02/18/2014: EF 40-45%. b.) TTE 02/22/2017: EF 45-50%. c.) TTE 03/24/2019: EF 45-50%. d.) TTE 03/21/2020: EF 40-45%   OSA on CPAP    Panic attacks    PSVT (paroxysmal supraventricular tachycardia) (HCC) 03/30/2020   a.) Holter 03/30/2020 --> 2 runs; fastest lasting 4 beats (184 bpm); longest lasting 4 beats (113 bpm)   Pulmonary embolism (HCC)    Tachycardia     Past Surgical History:  Procedure Laterality Date   ANTERIOR AND POSTERIOR REPAIR N/A 11/25/2017   Procedure: ANTERIOR (CYSTOCELE) AND POSTERIOR REPAIR (RECTOCELE);  Surgeon: Connell Davies, MD;  Location: [facility];  Service: Gynecology;  Laterality: N/A;   BREAST CYST ASPIRATION Left 03/22/2014   neg/ done by Dr Dellie   CARDIAC CATHETERIZATION  2003   [facility]: No significant coronary artery disease with reduced ejection fraction.   CARDIAC DEFIBRILLATOR PLACEMENT  04/2006   replaced 05/2013   CARDIAC DEFIBRILLATOR PLACEMENT  06/21/2013   Procedure:  CARDIAC DEFIBRILLATOR PLACEMENT (Medtronic single-chamber ICD, model number Evera XT, model number  [id number]): Location: [facility]; Surgeon: Franky Ned, MD   CARPAL TUNNEL RELEASE Right    COLONOSCOPY  2015   COLONOSCOPY N/A 10/13/2020   Procedure: COLONOSCOPY;  Surgeon: Onita Lisa Sharper, DO;  Location: [facility] END[facility]COPY;  Service: Gastroenterology;  Laterality: N/A;   COLONOSCOPY WITH PROPOFOL  N/A 10/04/2015   Procedure: COLONOSCOPY WITH PROPOFOL ;  Surgeon: Rogelia Copping, MD;  Location: [facility] ENDOSCOPY;  Service: Endoscopy;  Laterality: N/A;   CORONARY ANGIOPLASTY      CYSTOSCOPY  11/15/2014   Procedure: CYSTOSCOPY;  Surgeon: Archie Savers, MD;  Location: [facility];  Service: Gynecology;;   DIAGNOSTIC LAPAROSCOPY     ETHMOIDECTOMY Right 07/19/2021   Procedure: TOTAL ETHMOIDECTOMY WITH FRONTAL SINUS EXPLORATION;  Surgeon: Edda Mt, MD;  Location: [facility];  Service: ENT;  Laterality: Right;   FOOT SURGERY Bilateral    heer spur and bunion   IMAGE GUIDED SINUS SURGERY N/A 07/19/2021   Procedure: IMAGE GUIDED SINUS SURGERY;  Surgeon: Edda Mt, MD;  Location: [facility];  Service: ENT;  Laterality: N/A;   KNEE ARTHROSCOPY Left 03/02/2015   Procedure: ARTHROSCOPY  LEFT KNEE, PARTIAL LATERAL  MENISECTOMY, SYNOVECTOMY, MEDIAL & LATERAL CHONDROPLASTY;  Surgeon: Franky Cranker, MD;  Location: [facility];  Service: Orthopedics;  Laterality: Left;   LAPAROSCOPIC SALPINGO OOPHERECTOMY Left 11/15/2014   Procedure: LAPAROSCOPIC OOPHORECTOMY;  Surgeon: Archie Savers, MD;  Location: [facility];  Service: Gynecology;  Laterality: Left;   LAPAROSCOPIC SALPINGOOPHERECTOMY Right 09/2013   also with left salpingectomy   LEFT HEART CATH AND CORONARY ANGIOGRAPHY N/A 03/28/2020   Procedure: LEFT HEART CATH AND CORONARY ANGIOGRAPHY;  Surgeon: Darron Deatrice LABOR, MD;  Location: [facility];  Service: Cardiovascular;  Laterality: N/A;   MAXILLARY ANTROSTOMY Right 07/19/2021   Procedure: MAXILLARY ANTROSTOMY WITH REMOVAL TISSUE;  Surgeon: Edda Mt, MD;  Location: [facility];  Service: ENT;  Laterality: Right;   MINOR HEMORRHOIDECTOMY     NASAL TURBINATE REDUCTION Bilateral 07/19/2021   Procedure: INFERIOR TURBINATE REDUCTION;  Surgeon: Edda Mt, MD;  Location: [facility];  Service: ENT;  Laterality: Bilateral;   PULMONARY THROMBECTOMY Bilateral 10/03/2022   Procedure: PULMONARY THROMBECTOMY;  Surgeon: Marea Selinda RAMAN, MD;  Location: [facility];  Service: Cardiovascular;  Laterality: Bilateral;   SEPTOPLASTY N/A 07/19/2021   Procedure: SEPTOPLASTY;  Surgeon:  Edda Mt, MD;  Location: [facility];  Service: ENT;  Laterality: N/A;   TONSILLECTOMY     TOTAL ABDOMINAL HYSTERECTOMY     TRACHELECTOMY N/A 11/15/2014   Procedure: TRACHELECTOMY;  Surgeon: Archie Savers, MD;  Location: [facility];  Service: Gynecology;  Laterality: N/A;   TRIGGER FINGER RELEASE Right    TUBAL LIGATION Bilateral     Current Medications: Current Meds  Medication Sig   Acetaminophen  (TYLENOL  ARTHRITIS PAIN PO) Take 1,300 mg by mouth every 6 (six) hours as needed (Pain).   apixaban  (ELIQUIS ) 5 MG TABS tablet Take 5 mg by mouth 2 (two) times daily.   digoxin  (LANOXIN ) 0.125 MG tablet Take 0.5 tablets (0.0625 mg total) by mouth daily. Reduced from 0.125 mg.   esomeprazole (NEXIUM) 40 MG capsule Take 40 mg by mouth every morning.    furosemide  (LASIX ) 20 MG tablet Take one tablet (20mg ) by mouth Monday, Wednesday, Friday.   gabapentin  (NEURONTIN ) 100 MG capsule Take 100 mg by mouth 3 (three) times daily.   levothyroxine  (SYNTHROID ) 125 MCG tablet Take 125 mcg by mouth every morning.   metoprolol  succinate (TOPROL -XL) 25 MG 24 hr tablet Take 0.5 tablets (12.5 mg total)  by mouth daily. Reduced from 50 mg daily.   oxyCODONE -acetaminophen  (PERCOCET/ROXICET) 5-325 MG tablet Take 1 tablet by mouth 3 (three) times daily as needed for severe pain (pain score 7-10). Home med.   sertraline  (ZOLOFT ) 100 MG tablet Take 200 mg by mouth daily.   spironolactone  (ALDACTONE ) 25 MG tablet Take 1 tablet (25 mg total) by mouth daily.   tirzepatide  (MOUNJARO ) 5 MG/0.5ML Pen Inject 5 mg into the skin once a week. (Patient taking differently: Inject 5 mg into the skin once a week. On mondays)   [DISCONTINUED] ENTRESTO  24-26 MG Take 1 tablet by mouth 2 (two) times daily.   [DISCONTINUED] midodrine  (PROAMATINE ) 5 MG tablet Take 1 tablet (5 mg total) by mouth 2 (two) times daily with a meal.     Allergies:   Sulfa antibiotics, Sulfamethoxazole-trimethoprim, and Jardiance  [empagliflozin ]   Social  History   Socioeconomic History   Marital status: Married    Spouse name: Lisa Fuentes   Number of children: 2   Years of education: Not on file   Highest education level: Not on file  Occupational History   Not on file  Tobacco Use   Smoking status: Never   Smokeless tobacco: Never  Vaping Use   Vaping status: Never Used  Substance and Sexual Activity   Alcohol use: Yes    Alcohol/week: 0.0 standard drinks of alcohol    Comment: occassionally   Drug use: No   Sexual activity: Not Currently    Birth control/protection: Surgical  Other Topics Concern   Not on file  Social History Narrative   Not on file   Social Drivers of Health   Financial Resource Strain: Low Risk  (03/11/2023)   Received from [facility]   Overall Financial Resource Strain (CARDIA)    Difficulty of Paying Living Expenses: Not hard at all  Food Insecurity: No Food Insecurity (08/27/2023)   Hunger Vital Sign    Worried About Running Out of Food in the Last Year: Never true    Ran Out of Food in the Last Year: Never true  Transportation Needs: No Transportation Needs (08/27/2023)   PRAPARE - Administrator, Civil Service (Medical): No    Lack of Transportation (Non-Medical): No  Physical Activity: Not on file  Stress: Not on file  Social Connections: Not on file     Family History: The patient's family history includes Breast cancer (age of onset: 36) in her sister; Colon cancer in her mother; Hyperlipidemia in her mother; Hypertension in her mother; Ovarian cancer in her sister; Stroke in her mother; Throat cancer in her brother.  ROS:   Please see the history of present illness.     All other systems reviewed and are negative.  EKGs/Labs/Other Studies Reviewed:    The following studies were reviewed today:       Recent Labs: 07/14/2023: ALT 18 08/27/2023: B Natriuretic Peptide 49.2 08/29/2023: Hemoglobin 13.4; Magnesium  2.2; Platelets 176 08/30/2023: BUN 17; Creatinine,  Ser 0.90; Potassium 3.8; Sodium 134; TSH 35.061  Recent Lipid Panel No results found for: CHOL, TRIG, HDL, CHOLHDL, VLDL, LDLCALC, LDLDIRECT   Risk Assessment/Calculations:             Physical Exam:    VS:  BP 116/68 (BP Location: Left Arm, Patient Position: Sitting, Cuff Size: Normal)   Pulse 79   Ht 5' 7 (1.702 m)   Wt 218 lb (98.9 kg)   SpO2 98%   BMI 34.14 kg/m  Wt Readings from Last 3 Encounters:  09/06/23 218 lb (98.9 kg)  08/30/23 219 lb 2.2 oz (99.4 kg)  08/16/23 223 lb 9.6 oz (101.4 kg)     GEN:  Well nourished, well developed in no acute distress HEENT: Normal NECK: No JVD; No carotid bruits CARDIAC: RRR, no murmurs, rubs, gallops RESPIRATORY:  Clear to auscultation without rales, wheezing or rhonchi  ABDOMEN: Soft, non-tender, non-distended MUSCULOSKELETAL:  No edema; No deformity  SKIN: Warm and dry NEUROLOGIC:  Alert and oriented x 3 PSYCHIATRIC:  Normal affect   ASSESSMENT:    1. NICM (nonischemic cardiomyopathy) (HCC)   2. ICD (implantable cardioverter-defibrillator) in place   3. BMI 34.0-34.9,adult    PLAN:    In order of problems listed above:  Nonischemic cardiomyopathy EF 35% s/p AICD.  Likely Hereditary  with daughter also having heart failure.  BP much better, less fatigue.  Euvolemic on exam.  Continue Toprol -XL 12.5 mg daily, Aldactone  25 mg daily, Lasix  20 mg MWF.  Keep appointment with heart failure service. S/p AICD, monitoring as per device clinic. Morbid obesity, history of PEs, heart failure.  Continue Mounjaro .  Follow-up in 6 months.     Medication Adjustments/Labs and Tests Ordered: Current medicines are reviewed at length with the patient today.  Concerns regarding medicines are outlined above.  Orders Placed This Encounter  Procedures   EKG 12-Lead   No orders of the defined types were placed in this encounter.   Patient Instructions  Medication Instructions:  Your physician recommends that you  continue on your current medications as directed. Please refer to the Current Medication list given to you today.   *If you need a refill on your cardiac medications before your next appointment, please call your pharmacy*  Lab Work: No labs ordered today  If you have labs (blood work) drawn today and your tests are completely normal, you will receive your results only by: MyChart Message (if you have MyChart) OR A paper copy in the mail If you have any lab test that is abnormal or we need to change your treatment, we will call you to review the results.  Testing/Procedures: No test ordered today   Follow-Up: At [facility], [facility], [facility], you and your health needs are our priority.  As part of our continuing mission to provide you with exceptional heart care, our providers are all part of one team.  This team includes your primary Cardiologist (physician) and Advanced Practice Providers or APPs (Physician Assistants and Nurse Practitioners) who all work together to provide you with the care you need, when you need it.  Your next appointment:   6 month(s)  Provider:   You may see Lisa Cave, MD or one of the following Advanced Practice Providers on your designated Care Team:   Lonni Meager, NP Lesley Maffucci, PA-C Bernardino Bring, PA-C Cadence Crandall, PA-C Tylene Lunch, NP Barnie Hila, NP    We recommend signing up for the patient portal called MyChart.  Sign up information is provided on this After Visit Summary.  MyChart is used to connect with patients for Virtual Visits (Telemedicine).  Patients are able to view lab/test results, encounter notes, upcoming appointments, etc.  Non-urgent messages can be sent to your provider as well.   To learn more about what you can do with MyChart, go to [email or web address].          Signed, Lisa Cave, MD  09/06/2023 12:46 PM    [facility]

## 2023-09-09 ENCOUNTER — Encounter

## 2023-09-10 ENCOUNTER — Telehealth: Payer: Self-pay | Admitting: Family

## 2023-09-10 ENCOUNTER — Telehealth (HOSPITAL_COMMUNITY): Payer: Self-pay

## 2023-09-10 NOTE — Telephone Encounter (Signed)
 Called to confirm/remind patient of their appointment at the [facility] on 09/11/23.   Appointment:   [x] Confirmed  [] Left mess   [] No answer/No voice mail  [] VM Full/unable to leave message  [] Phone not in service  Patient reminded to bring all medications and/or complete list.  Confirmed patient has transportation. Gave directions, instructed to utilize valet parking.

## 2023-09-10 NOTE — Telephone Encounter (Signed)
 Attempted to contact the patient with instructions for her test, but there was no answer or answering machine set up. S.Everardo Voris CCT

## 2023-09-11 ENCOUNTER — Telehealth: Payer: Self-pay | Admitting: *Deleted

## 2023-09-11 ENCOUNTER — Encounter: Payer: Self-pay | Admitting: Family

## 2023-09-11 ENCOUNTER — Encounter

## 2023-09-11 ENCOUNTER — Ambulatory Visit: Admitting: Family

## 2023-09-11 ENCOUNTER — Other Ambulatory Visit
Admission: RE | Admit: 2023-09-11 | Discharge: 2023-09-11 | Disposition: A | Discharge status: Home / Self Care | Source: Ambulatory Visit | Attending: Family | Admitting: Family

## 2023-09-11 VITALS — BP 104/78 | HR 82 | Wt 218.0 lb

## 2023-09-11 DIAGNOSIS — G4733 Obstructive sleep apnea (adult) (pediatric): Secondary | ICD-10-CM | POA: Insufficient documentation

## 2023-09-11 DIAGNOSIS — Z79899 Other long term (current) drug therapy: Secondary | ICD-10-CM | POA: Insufficient documentation

## 2023-09-11 DIAGNOSIS — Z7901 Long term (current) use of anticoagulants: Secondary | ICD-10-CM | POA: Insufficient documentation

## 2023-09-11 DIAGNOSIS — R2241 Localized swelling, mass and lump, right lower limb: Secondary | ICD-10-CM | POA: Insufficient documentation

## 2023-09-11 DIAGNOSIS — I50810 Right heart failure, unspecified: Secondary | ICD-10-CM | POA: Diagnosis not present

## 2023-09-11 DIAGNOSIS — I5022 Chronic systolic (congestive) heart failure: Secondary | ICD-10-CM

## 2023-09-11 DIAGNOSIS — Z6834 Body mass index (BMI) 34.0-34.9, adult: Secondary | ICD-10-CM | POA: Insufficient documentation

## 2023-09-11 DIAGNOSIS — I428 Other cardiomyopathies: Secondary | ICD-10-CM | POA: Insufficient documentation

## 2023-09-11 DIAGNOSIS — E7800 Pure hypercholesterolemia, unspecified: Secondary | ICD-10-CM | POA: Insufficient documentation

## 2023-09-11 DIAGNOSIS — Z86718 Personal history of other venous thrombosis and embolism: Secondary | ICD-10-CM | POA: Insufficient documentation

## 2023-09-11 DIAGNOSIS — I110 Hypertensive heart disease with heart failure: Secondary | ICD-10-CM | POA: Insufficient documentation

## 2023-09-11 DIAGNOSIS — I10 Essential (primary) hypertension: Secondary | ICD-10-CM | POA: Diagnosis not present

## 2023-09-11 DIAGNOSIS — Z86711 Personal history of pulmonary embolism: Secondary | ICD-10-CM | POA: Insufficient documentation

## 2023-09-11 DIAGNOSIS — I472 Ventricular tachycardia, unspecified: Secondary | ICD-10-CM | POA: Diagnosis not present

## 2023-09-11 LAB — BASIC METABOLIC PANEL WITH GFR
Anion gap: 9 (ref 5–15)
BUN: 10 mg/dL (ref 8–23)
CO2: 26 mmol/L (ref 22–32)
Calcium: 9.8 mg/dL (ref 8.9–10.3)
Chloride: 104 mmol/L (ref 98–111)
Creatinine, Ser: 0.68 mg/dL (ref 0.44–1.00)
GFR, Estimated: >60 mL/min (ref >60)
Glucose, Bld: 96 mg/dL (ref 70–99)
Potassium: 5.1 mmol/L (ref 3.5–5.1)
Sodium: 139 mmol/L (ref 135–145)

## 2023-09-11 LAB — LIPID PANEL
Cholesterol: 216 mg/dL — ABNORMAL HIGH (ref 0–200)
HDL: 33 mg/dL — ABNORMAL LOW (ref >40)
LDL Cholesterol: 172 mg/dL — ABNORMAL HIGH (ref 0–99)
Total CHOL/HDL Ratio: 6.5 ratio
Triglycerides: 57 mg/dL (ref <150)
VLDL: 11 mg/dL (ref 0–40)

## 2023-09-11 LAB — HEMOGLOBIN A1C
Hgb A1c MFr Bld: 5.3 % (ref 4.8–5.6)
Mean Plasma Glucose: 105 mg/dL

## 2023-09-11 NOTE — Telephone Encounter (Signed)
 Lisa Fuentes has been out from our program since 7/17. She states she has had some health issues but is planning to come back next week once she gets her back injections. Follow up next week if she does not return.

## 2023-09-11 NOTE — Progress Notes (Signed)
 [facility]  NOTE   Primary Care: Valora Agent, MD Primary Cardiologist: Redell Cave, MD  HF provider: Gardenia Led, MD   Chief complaint: shortness of breath   HPI:  Lisa Fuentes is a 63 y.o. female with a hx of systolic HF due to NICM, VT s/p MDT ICD morbid obesity, hypertension, PE s/p endovascular thrombolysis 9/24 on Eliquis , OSA and RUE DVT (06/23). Diagnosed with systolic HF in 2000. Cath 2003 no CAD. Had repeat cath 2022 with normal cors.  LHC 03/28/20:  1.  Normal coronary arteries. 2.  Left ventricular angiography was not performed.  EF was mildly reduced by echo. 3.  Mildly elevated left ventricular end-diastolic pressure 20 mmHg.  Had RUE DVT 06/23 after trauma. Upper extremity u/s with multiple RUE DVTs. CTA chest LLL segmental and subsegmental pulmonary emboli without evidence of RV strain. Started on Eliquis . She completed several months of Eliquis , and it was then discontinued per hematology.   Admitted [facility] 10/01/2022. CTA chest with moderate volume of acute PE with RV strain. She was started on heparin . She underwent pulmonary thrombectomy. D/c'd on Eliquis . Nuclear stress test on 10/03/2022,  LVEF 44% with moderate fixed basal septal defect without ischemia. Echo 10/03/22 EF 30-35% mild TR.   Was in the ED 10/10/22 due to mid-sternal chest pain and shortness of breath. Labs normal.   Echo 10/03/22: EF 30-35% with moderate LVH, Grade I DD, mild LAE  She was scheduled for PYP scan but it looks like she had a nuclear stress test instead on 01/09/23 EF 40%  Admitted 08/27/23 with worsening shortness of breath and fatigue, associated lower extremity edema, dyspnea on even minimal exertion due to a/c heart failure. IV diuresed. Echo 08/28/23: EF 30-35%, G1DD, normal RV. Diuretic changed to oral. Started on midodrine  due to hypotension.   She presents today for a hospital follow-up visit with a chief complaint of shortness of breath (improving). Has  associated fatigue (improving), occasional palpitations, frequent dizziness, stable pedal edema. Says that she's sleeping well with CPAP. Has not resumed cardiac rehab yet. Notes a pea sized nodule on the outer aspect of her right thigh as well as one on the back on her right thigh. Slightly tender to touch. No injuries, falls on injections in that area.   Hasn't not taken her mounjaro  this week because she really doesn't want to lose any more weight. She increased her lasix  on her own last week due to her shortness of breath. Estimates that she took it 4-5 days (instead of 3) last week. Has taken it the last 2 days this week (today is Wed).   Previous cardiac studies:  Echo 02/18/14: EF 40-45% with Grade I DD Echo 02/22/17: EF 45-50% with Grade I DD Echo 03/24/19: EF 45-50% with mild LVH, Grade I DD, borderline aortic root dilatation 37 mm Echo 03/21/20: EF 40-45% with Grade I DD, borderline aortic root dilatation 37 mm  ROS: All systems negative except what is listed in HPI, PMH and Problem List   SH:  Social History   Socioeconomic History   Marital status: Married    Spouse name: Jimmy   Number of children: 2   Years of education: Not on file   Highest education level: Not on file  Occupational History   Not on file  Tobacco Use   Smoking status: Never   Smokeless tobacco: Never  Vaping Use   Vaping status: Never Used  Substance and Sexual Activity   Alcohol use: Yes  Alcohol/week: 0.0 standard drinks of alcohol    Comment: occassionally   Drug use: No   Sexual activity: Not Currently    Birth control/protection: Surgical  Other Topics Concern   Not on file  Social History Narrative   Not on file   Social Drivers of Health   Financial Resource Strain: Low Risk  (03/11/2023)   Received from [facility]   Overall Financial Resource Strain (CARDIA)    Difficulty of Paying Living Expenses: Not hard at all  Food Insecurity: No Food Insecurity (08/27/2023)    Hunger Vital Sign    Worried About Running Out of Food in the Last Year: Never true    Ran Out of Food in the Last Year: Never true  Transportation Needs: No Transportation Needs (08/27/2023)   PRAPARE - Administrator, Civil Service (Medical): No    Lack of Transportation (Non-Medical): No  Physical Activity: Not on file  Stress: Not on file  Social Connections: Not on file  Intimate Partner Violence: Not At Risk (08/27/2023)   Humiliation, Afraid, Rape, and Kick questionnaire    Fear of Current or Ex-Partner: No    Emotionally Abused: No    Physically Abused: No    Sexually Abused: No    FH:  Family History  Problem Relation Age of Onset   Hypertension Mother    Hyperlipidemia Mother    Stroke Mother    Colon cancer Mother    Breast cancer Sister 71   Ovarian cancer Sister    Throat cancer Brother     Past Medical History:  Diagnosis Date   AICD (automatic cardioverter/defibrillator) present 2008   a.) Medtronic device placed 2008. b.) replaced 06/21/2013 (Medtronic Evera XT single-chamber ICD; model number [id number]).   Anemia    Anxiety    Aortic dilatation (HCC) 03/24/2019   a.) TTE 03/24/2019: Ao root measured 37 mm. b.) TTE 03/21/2020: Ao root measured 37 mm.   Arthritis    Depression    Difficult intubation    DOE (dyspnea on exertion)    Endometriosis of vagina 09/2013   Intra-operative findings of endometriosis implants on cervical stump   Essential hypertension    GERD (gastroesophageal reflux disease)    Headache    HFrEF (heart failure with reduced ejection fraction) (HCC)    a.) TTE 02/18/2014: EF 40-45%; tiv AR, mild MR/TR/PR; G1DD. b.) TTE 02/22/2017: EF 45-50%; mild LVH; triv PR; G1DD. c.) TTE 03/24/2019: EF 45-50%; glob HK, mild LVH; triv MR/TR/PR. d.) TTE 03/21/2020: EF 40-45%; glob HK.   History of 2019 novel coronavirus disease (COVID-19) 02/02/2020   History of 2019 novel coronavirus disease (COVID-19) 02/02/2020   Hypokalemia     Hypothyroidism    a.) s/p radioactive iodine  Tx; on levothyroxine    Mixed hyperlipidemia    Nonischemic cardiomyopathy (HCC)    a.) TTE 02/18/2014: EF 40-45%. b.) TTE 02/22/2017: EF 45-50%. c.) TTE 03/24/2019: EF 45-50%. d.) TTE 03/21/2020: EF 40-45%   OSA on CPAP    Panic attacks    PSVT (paroxysmal supraventricular tachycardia) (HCC) 03/30/2020   a.) Holter 03/30/2020 --> 2 runs; fastest lasting 4 beats (184 bpm); longest lasting 4 beats (113 bpm)   Pulmonary embolism (HCC)    Tachycardia     Current Outpatient Medications  Medication Sig Dispense Refill   Acetaminophen  (TYLENOL  ARTHRITIS PAIN PO) Take 1,300 mg by mouth every 6 (six) hours as needed (Pain).     apixaban  (ELIQUIS ) 5 MG TABS tablet Take 5  mg by mouth 2 (two) times daily.     digoxin  (LANOXIN ) 0.125 MG tablet Take 0.5 tablets (0.0625 mg total) by mouth daily. Reduced from 0.125 mg.     esomeprazole (NEXIUM) 40 MG capsule Take 40 mg by mouth every morning.      furosemide  (LASIX ) 20 MG tablet Take one tablet (20mg ) by mouth Monday, Wednesday, Friday. 36 tablet 0   gabapentin  (NEURONTIN ) 100 MG capsule Take 100 mg by mouth 3 (three) times daily.     levothyroxine  (SYNTHROID ) 125 MCG tablet Take 125 mcg by mouth every morning.     metoprolol  succinate (TOPROL -XL) 25 MG 24 hr tablet Take 0.5 tablets (12.5 mg total) by mouth daily. Reduced from 50 mg daily. 15 tablet 2   oxyCODONE -acetaminophen  (PERCOCET/ROXICET) 5-325 MG tablet Take 1 tablet by mouth 3 (three) times daily as needed for severe pain (pain score 7-10). Home med.     sertraline  (ZOLOFT ) 100 MG tablet Take 200 mg by mouth daily.     spironolactone  (ALDACTONE ) 25 MG tablet Take 1 tablet (25 mg total) by mouth daily. 90 tablet 3   tirzepatide  (MOUNJARO ) 5 MG/0.5ML Pen Inject 5 mg into the skin once a week. (Patient taking differently: Inject 5 mg into the skin once a week. On mondays) 2 mL 1   No current facility-administered medications for this visit.   Vitals:    09/11/23 0956  BP: 104/78  Pulse: 82  SpO2: 97%  Weight: 218 lb (98.9 kg)   Wt Readings from Last 3 Encounters:  09/11/23 218 lb (98.9 kg)  09/06/23 218 lb (98.9 kg)  08/30/23 219 lb 2.2 oz (99.4 kg)   Lab Results  Component Value Date   CREATININE 0.90 08/30/2023   CREATININE 1.05 (H) 08/29/2023   CREATININE 1.15 (H) 08/28/2023    PHYSICAL EXAM:  General: Well appearing.  Cor: No JVD. Regular rhythm, rate.  Lungs: clear Abdomen: soft, nontender, nondistended. Extremities: no edema. Pea sized nodule right lateral thigh, slightly mobile Neuro:. Affect pleasant   ASSESSMENT & PLAN:  1. NICM with reduced ejection fraction- - cath 2003 and 2022 no CAD - nuclear stress test on 10/03/2022 EF 44% with moderate fixed basal septal defect without evidence of ischemia.  - Stable NYHA II - appears euvolemic although optivol reading today is trending up - weight down 11 pounds from last visit here 3 months ago - ICD interrogated without VT / VF episodes - Echo 02/18/14: EF 40-45% with Grade I DD - Echo 02/22/17: EF 45-50% with Grade I DD - Echo 03/24/19: EF 45-50% with mild LVH, Grade I DD, borderline aortic root dilatation 37 mm  -Echo 03/21/20: EF 40-45% with Grade I DD, borderline aortic root dilatation 37 mm - Echo 10/03/22: EF 30-35% with moderate LVH, Grade I DD, mild LAE - 12/13/22: Echo LVEF 25-30%, normal RV function.  - 12/24 Myoview   EF 40% - Echo 08/28/23: EF 30-35%, G1DD, normal RV. - updated echo currently scheduled for 09/26/23 - continue digoxin  0.0625 mg daily. Dig level 08/27/23 was 0.8 - continue lasix  20mg  M, W, F although she has been taking it more frequently. 4-5 days last week and twice already this week. Will get BMET today and call patient if frequency needs to change or if she needs potassium supplement - continue metoprolol  succinate 25mg  daily - continue spironolactone  25mg  daily - was previously on entresto  / losartan  but it was stopped due to hypotension -  will continue to hold SGLT2i; she reports having a history  of very severe yeast infections - has CPX testing scheduled for 09/12/23 - She has had carpal tunnel in both hands now s/p surgical repair; although, her TTE does not appear like a typical case of amyloid, will obtain PYP. She is concerned about having ATTR amyloid after reading about the disease. She was scheduled for PYP scan but it looks like she had a nuclear stress test instead on 01/09/23 EF 40%.  - PYP scheduled for 09/17/23 - participated in cardiac rehab but needs to resume now that her SOB has improved. - BNP 08/27/23 was 49.2  2. RV Failure- - TTE from 10/03/22 with at least moderately reduced RV function 2/2 acute PE; suspect this has improved after thrombectomy.  - TTE 12/13/22 w/ LVEF 25%-30%, with low normal RV function now. Euvolemic on exam with no clinical signs of RV failure - continue digoxin  125mcg daily - Echo 08/28/23: EF 30-35%, G1DD, normal RV. - RHC if function is declines  3. H/o VT- - s/p MDT ICD - Device interrogation today with no VT/VF   4. Recurrent DVT/PE- - s/p thrombectomy - 12/13/22: Echo LVEF 25-30%, normal RV function.  - Echo 08/28/23: EF 30-35%, G1DD, normal RV. - No evidence of RV strain on echo  - continue lifelong Eliquis  -> after 1 year can decrease to 2.5 bid per [employer] data - saw cardiology (Agbor-Etang) 08/25  5. Morbid obesity- - Body mass index is 34.14 kg/ m2 - continues mounjaro  5mg  weekly on Mondays although hasn't taken it this week because she doesn't want to lose any more weight. Encouraged her to resume this for potential cardiac benefits - Last A1c 5.9 02/28/22  - A1c today  6: HTN- - BP 104/78 - BMET 08/30/23 reviewed: sodium 134, potassium 3.8, creatinine 0.9 & GFR >60 - BMET today  7. OSA - Compliant with CPAP  8: HLD- - LDL 03/25/23 was 136 - lipid panel today  9: Right leg nodule- - unclear of etiology of this. Can try warm compresses on nodule - follow-up  with PCP regarding this - no calf pain and no edema so unlikely related to previous DVT. If this changes, she will let us  know   Return for already scheduled appt 09/25 with HF MD, sooner if needed.   Ellouise DELENA Class, FNP  8:02 AM 09/11/23

## 2023-09-11 NOTE — Patient Instructions (Addendum)
 Medication Changes:  No medication changes.   Lab Work:  Go over to the [facility]. Go pass the gift shop and have your blood work completed.  We will only call you if the results are abnormal or if the provider would like to make medication changes.  No news is good news.   Follow-Up on: 10/25/23 at 2:45 PM   Thank you for choosing [facility].    At the [facility], you and your health needs are our priority. We have a designated team specialized in the treatment of Heart Failure. This Care Team includes your primary Heart Failure Specialized Cardiologist (physician), Advanced Practice Providers (APPs- Physician Assistants and Nurse Practitioners), and Pharmacist who all work together to provide you with the care you need, when you need it.   You may see any of the following providers on your designated Care Team at your next follow up:  Dr. Toribio Fuel Dr. Ezra Shuck Dr. Ria Commander Dr. Morene Brownie Ellouise Class, FNP Jaun Bash, RPH-CPP  Please be sure to bring in all your medications bottles to every appointment.   Need to Contact Us :  If you have any questions or concerns before your next appointment please send us  a message through [place] or call our office at [phone].    TO LEAVE A MESSAGE FOR THE NURSE SELECT OPTION 2, PLEASE LEAVE A MESSAGE INCLUDING: YOUR NAME DATE OF BIRTH CALL BACK NUMBER REASON FOR CALL**this is important as we prioritize the call backs  YOU WILL RECEIVE A CALL BACK THE SAME DAY AS LONG AS YOU CALL BEFORE 4:00 PM

## 2023-09-12 ENCOUNTER — Ambulatory Visit: Payer: Self-pay | Admitting: Family

## 2023-09-12 ENCOUNTER — Encounter

## 2023-09-12 ENCOUNTER — Encounter (HOSPITAL_COMMUNITY)

## 2023-09-12 MED ORDER — ROSUVASTATIN CALCIUM 5 MG PO TABS: 5.0000 mg | ORAL_TABLET | Freq: Every day | ORAL | 3 refills | Status: DC

## 2023-09-12 NOTE — Telephone Encounter (Signed)
-----   Message from Ellouise DELENA Class sent at 09/12/2023  8:26 AM EDT ----- Kidney function and A1c look good. Cholesterol and LDL (bad cholesterol) are both elevated and higher than when they were checked back in February. Decrease fried/ fatty foods and start rosuvastatin   5mg  daily.  ----- Message ----- From: Interface, Lab In [facility] Sent: 09/11/2023  11:30 AM EDT To: Ellouise DELENA Class, FNP

## 2023-09-12 NOTE — Telephone Encounter (Signed)
 Spoke to pt to relay lab work and new medication information. Pt verbalized understanding and is agreeable to plan. Orders placed. No further questions at this time.

## 2023-09-13 DIAGNOSIS — M461 Sacroiliitis, not elsewhere classified: Secondary | ICD-10-CM | POA: Diagnosis not present

## 2023-09-16 ENCOUNTER — Other Ambulatory Visit: Payer: Self-pay | Admitting: Internal Medicine

## 2023-09-16 ENCOUNTER — Encounter

## 2023-09-16 ENCOUNTER — Ambulatory Visit (INDEPENDENT_AMBULATORY_CARE_PROVIDER_SITE_OTHER): Admitting: Nurse Practitioner

## 2023-09-16 DIAGNOSIS — I5022 Chronic systolic (congestive) heart failure: Secondary | ICD-10-CM

## 2023-09-17 ENCOUNTER — Ambulatory Visit (HOSPITAL_COMMUNITY)
Admission: RE | Admit: 2023-09-17 | Discharge: 2023-09-17 | Disposition: A | Discharge status: Home / Self Care | Source: Ambulatory Visit | Attending: Internal Medicine | Admitting: Internal Medicine

## 2023-09-17 ENCOUNTER — Ambulatory Visit (INDEPENDENT_AMBULATORY_CARE_PROVIDER_SITE_OTHER): Payer: Medicare HMO

## 2023-09-17 DIAGNOSIS — I5022 Chronic systolic (congestive) heart failure: Secondary | ICD-10-CM

## 2023-09-17 DIAGNOSIS — I428 Other cardiomyopathies: Secondary | ICD-10-CM | POA: Diagnosis not present

## 2023-09-17 MED ORDER — TECHNETIUM TC 99M TETROFOSMIN IV KIT
21.2000 | PACK | Freq: Once | INTRAVENOUS | Status: AC | PRN
  Administered 2023-09-17: 21.2 via INTRAVENOUS

## 2023-09-18 ENCOUNTER — Encounter

## 2023-09-18 ENCOUNTER — Encounter: Payer: Self-pay | Admitting: *Deleted

## 2023-09-18 DIAGNOSIS — M25511 Pain in right shoulder: Secondary | ICD-10-CM | POA: Diagnosis not present

## 2023-09-18 DIAGNOSIS — M25611 Stiffness of right shoulder, not elsewhere classified: Secondary | ICD-10-CM | POA: Diagnosis not present

## 2023-09-18 DIAGNOSIS — I5022 Chronic systolic (congestive) heart failure: Secondary | ICD-10-CM

## 2023-09-18 LAB — MYOCARDIAL AMYLOID PLANAR & SPECT: H/CL Ratio: 1.2

## 2023-09-18 NOTE — Progress Notes (Signed)
 Cardiac Individual Treatment Plan  Patient Details  Name: Lisa Fuentes MRN: [id number] Date of Birth: Oct 17, 1960 Referring Provider:   Flowsheet Row Cardiac Rehab from 07/31/2023 in [facility] Cardiac and Pulmonary Rehab  Referring Provider Dr. Ria Commander, MD    Initial Encounter Date:  Flowsheet Row Cardiac Rehab from 07/31/2023 in [facility] Cardiac and Pulmonary Rehab  Date 07/31/23    Visit Diagnosis: Heart failure, chronic systolic (HCC)  Patient's Home Medications on Admission:  Current Outpatient Medications:    Acetaminophen  (TYLENOL  ARTHRITIS PAIN PO), Take 1,300 mg by mouth every 6 (six) hours as needed (Pain)., Disp: , Rfl:    apixaban  (ELIQUIS ) 5 MG TABS tablet, Take 5 mg by mouth 2 (two) times daily., Disp: , Rfl:    digoxin  (LANOXIN ) 0.125 MG tablet, Take 0.5 tablets (0.0625 mg total) by mouth daily. Reduced from 0.125 mg., Disp: , Rfl:    esomeprazole (NEXIUM) 40 MG capsule, Take 40 mg by mouth every morning. , Disp: , Rfl:    furosemide  (LASIX ) 20 MG tablet, Take one tablet (20mg ) by mouth Monday, Wednesday, Friday., Disp: 36 tablet, Rfl: 0   levothyroxine  (SYNTHROID ) 125 MCG tablet, Take 125 mcg by mouth every morning., Disp: , Rfl:    metoprolol  succinate (TOPROL -XL) 25 MG 24 hr tablet, Take 0.5 tablets (12.5 mg total) by mouth daily. Reduced from 50 mg daily., Disp: 15 tablet, Rfl: 2   oxyCODONE -acetaminophen  (PERCOCET/ROXICET) 5-325 MG tablet, Take 1 tablet by mouth 3 (three) times daily as needed for severe pain (pain score 7-10). Home med., Disp: , Rfl:    rosuvastatin  (CRESTOR ) 5 MG tablet, Take 1 tablet (5 mg total) by mouth daily., Disp: 90 tablet, Rfl: 3   sertraline  (ZOLOFT ) 100 MG tablet, Take 200 mg by mouth daily., Disp: , Rfl:    spironolactone  (ALDACTONE ) 25 MG tablet, Take 1 tablet (25 mg total) by mouth daily., Disp: 90 tablet, Rfl: 3   tirzepatide  (MOUNJARO ) 5 MG/0.5ML Pen, Inject 5 mg into the skin once a week. (Patient taking differently: Inject 5 mg into  the skin once a week. On mondays), Disp: 2 mL, Rfl: 1  Past Medical History: Past Medical History:  Diagnosis Date   AICD (automatic cardioverter/defibrillator) present 2008   a.) Medtronic device placed 2008. b.) replaced 06/21/2013 (Medtronic Evera XT single-chamber ICD; model number [id number]).   Anemia    Anxiety    Aortic dilatation (HCC) 03/24/2019   a.) TTE 03/24/2019: Ao root measured 37 mm. b.) TTE 03/21/2020: Ao root measured 37 mm.   Arthritis    Depression    Difficult intubation    DOE (dyspnea on exertion)    Endometriosis of vagina 09/2013   Intra-operative findings of endometriosis implants on cervical stump   Essential hypertension    GERD (gastroesophageal reflux disease)    Headache    HFrEF (heart failure with reduced ejection fraction) (HCC)    a.) TTE 02/18/2014: EF 40-45%; tiv AR, mild MR/TR/PR; G1DD. b.) TTE 02/22/2017: EF 45-50%; mild LVH; triv PR; G1DD. c.) TTE 03/24/2019: EF 45-50%; glob HK, mild LVH; triv MR/TR/PR. d.) TTE 03/21/2020: EF 40-45%; glob HK.   History of 2019 novel coronavirus disease (COVID-19) 02/02/2020   History of 2019 novel coronavirus disease (COVID-19) 02/02/2020   Hypokalemia    Hypothyroidism    a.) s/p radioactive iodine  Tx; on levothyroxine    Mixed hyperlipidemia    Nonischemic cardiomyopathy (HCC)    a.) TTE 02/18/2014: EF 40-45%. b.) TTE 02/22/2017: EF 45-50%. c.) TTE 03/24/2019: EF 45-50%. d.)  TTE 03/21/2020: EF 40-45%   OSA on CPAP    Panic attacks    PSVT (paroxysmal supraventricular tachycardia) (HCC) 03/30/2020   a.) Holter 03/30/2020 --> 2 runs; fastest lasting 4 beats (184 bpm); longest lasting 4 beats (113 bpm)   Pulmonary embolism (HCC)    Tachycardia     Tobacco Use: Social History   Tobacco Use  Smoking Status Never  Smokeless Tobacco Never    Labs: Review Flowsheet       Latest Ref Rng & Units 08/06/2007 09/11/2023  Labs for ITP Cardiac and Pulmonary Rehab  Cholestrol 0 - 200 mg/dL - 783   LDL (calc)  0 - 99 mg/dL - 827   HDL-C >59 mg/dL - 33   Trlycerides <849 mg/dL - 57   Hemoglobin J8r 4.8 - 5.6 % - 5.3   TCO2 - 30  -     Exercise Target Goals: Exercise Program Goal: Individual exercise prescription set using results from initial 6 min walk test and THRR while considering  patient's activity barriers and safety.   Exercise Prescription Goal: Initial exercise prescription builds to 30-45 minutes a day of aerobic activity, 2-3 days per week.  Home exercise guidelines will be given to patient during program as part of exercise prescription that the participant will acknowledge.   Education: Aerobic Exercise: - Group verbal and visual presentation on the components of exercise prescription. Introduces F.I.T.T principle from ACSM for exercise prescriptions.  Reviews F.I.T.T. principles of aerobic exercise including progression. Written material provided at class time.   Education: Resistance Exercise: - Group verbal and visual presentation on the components of exercise prescription. Introduces F.I.T.T principle from ACSM for exercise prescriptions  Reviews F.I.T.T. principles of resistance exercise including progression. Written material provided at class time.    Education: Exercise & Equipment Safety: - Individual verbal instruction and demonstration of equipment use and safety with use of the equipment. Flowsheet Row Cardiac Rehab from 07/31/2023 in [facility] Cardiac and Pulmonary Rehab  Date 07/31/23  Educator NT  Instruction Review Code 1- Verbalizes Understanding    Education: Exercise Physiology & General Exercise Guidelines: - Group verbal and written instruction with models to review the exercise physiology of the cardiovascular system and associated critical values. Provides general exercise guidelines with specific guidelines to those with heart or lung disease. Written material provided at class time.   Education: Flexibility, Balance, Mind/Body Relaxation: - Group verbal  and visual presentation with interactive activity on the components of exercise prescription. Introduces F.I.T.T principle from ACSM for exercise prescriptions. Reviews F.I.T.T. principles of flexibility and balance exercise training including progression. Also discusses the mind body connection.  Reviews various relaxation techniques to help reduce and manage stress (i.e. Deep breathing, progressive muscle relaxation, and visualization). Balance handout provided to take home. Written material provided at class time.   Activity Barriers & Risk Stratification:  Activity Barriers & Cardiac Risk Stratification - 07/30/23 1541       Activity Barriers & Cardiac Risk Stratification   Activity Barriers Back Problems;Joint Problems;Balance Concerns;History of Falls   rotator cuff- injured   Cardiac Risk Stratification High          6 Minute Walk:  6 Minute Walk     Row Name 07/31/23 1122         6 Minute Walk   Phase Initial     Distance 780 feet     Walk Time 5.5 minutes     # of Rest Breaks 1     MPH  1.61     METS 2.16     RPE 15     Perceived Dyspnea  2     VO2 Peak 7.56     Symptoms Yes (comment)     Comments fatigue     Resting HR 93 bpm     Resting BP 98/58     Resting Oxygen Saturation  97 %     Exercise Oxygen Saturation  during 6 min walk 97 %     Max Ex. HR 111 bpm     Max Ex. BP 122/66     2 Minute Post BP 102/60        Oxygen Initial Assessment:   Oxygen Re-Evaluation:   Oxygen Discharge (Final Oxygen Re-Evaluation):   Initial Exercise Prescription:  Initial Exercise Prescription - 07/31/23 1100       Date of Initial Exercise RX and Referring Provider   Date 07/31/23    Referring Provider Dr. Aditya Sabharwal, MD      Oxygen   Maintain Oxygen Saturation 88% or higher      Recumbant Bike   Level 1    RPM 50    Watts 15    Minutes 15    METs 2.16      NuStep   Level 2    SPM 80    Minutes 15    METs 2.16      Track   Laps 20    Minutes  15    METs 2.09      Prescription Details   Frequency (times per week) 3    Duration Progress to 30 minutes of continuous aerobic without signs/symptoms of physical distress      Intensity   THRR 40-80% of Max Heartrate 119-146    Ratings of Perceived Exertion 11-13    Perceived Dyspnea 0-4      Progression   Progression Continue to progress workloads to maintain intensity without signs/symptoms of physical distress.      Resistance Training   Training Prescription Yes    Weight 2 lb, 3 lb    Reps 10-15          Perform Capillary Blood Glucose checks as needed.  Exercise Prescription Changes:   Exercise Prescription Changes     Row Name 07/31/23 1100 08/13/23 1100 08/28/23 1100         Response to Exercise   Blood Pressure (Admit) 98/58 118/70 104/62     Blood Pressure (Exercise) 122/66 130/62 128/62     Blood Pressure (Exit) 102/60 104/62 98/70     Heart Rate (Admit) 93 bpm 98 bpm 83 bpm     Heart Rate (Exercise) 111 bpm 125 bpm 109 bpm     Heart Rate (Exit) 88 bpm 103 bpm 93 bpm     Oxygen Saturation (Admit) 97 % -- --     Oxygen Saturation (Exercise) 97 % -- --     Rating of Perceived Exertion (Exercise) 15 12 13      Perceived Dyspnea (Exercise) 2 -- --     Symptoms fatigue none none     Comments Results first 2 weeks of exercise --     Duration -- Progress to 30 minutes of  aerobic without signs/symptoms of physical distress Progress to 30 minutes of  aerobic without signs/symptoms of physical distress     Intensity -- THRR unchanged THRR unchanged       Progression   Progression -- Continue to progress workloads to maintain intensity without  signs/symptoms of physical distress. Continue to progress workloads to maintain intensity without signs/symptoms of physical distress.     Average METs -- 3.29 3.38       Resistance Training   Training Prescription -- Yes Yes     Weight -- 2 lb, 3 lb 2 lb, 3 lb     Reps -- 10-15 10-15       Interval Training    Interval Training -- No No       Recumbant Bike   Level -- 4 5     Watts -- 15 15     Minutes -- 15 15     METs -- 3.5 4.2       NuStep   Level -- 5 5     Minutes -- 15 15     METs -- 5.5 4       Oxygen   Maintain Oxygen Saturation -- 88% or higher 88% or higher        Exercise Comments:   Exercise Comments     Row Name 08/05/23 1356           Exercise Comments First full day of exercise!  Patient was oriented to gym and equipment including functions, settings, policies, and procedures.  Patient's individual exercise prescription and treatment plan were reviewed.  All starting workloads were established based on the results of the 6 minute walk test done at initial orientation visit.  The plan for exercise progression was also introduced and progression will be customized based on patient's performance and goals.          Exercise Goals and Review:   Exercise Goals     Row Name 07/31/23 1128             Exercise Goals   Increase Physical Activity Yes       Intervention Provide advice, education, support and counseling about physical activity/exercise needs.;Develop an individualized exercise prescription for aerobic and resistive training based on initial evaluation findings, risk stratification, comorbidities and participant's personal goals.       Expected Outcomes Short Term: Attend rehab on a regular basis to increase amount of physical activity.;Long Term: Add in home exercise to make exercise part of routine and to increase amount of physical activity.;Long Term: Exercising regularly at least 3-5 days a week.       Increase Strength and Stamina Yes       Intervention Provide advice, education, support and counseling about physical activity/exercise needs.;Develop an individualized exercise prescription for aerobic and resistive training based on initial evaluation findings, risk stratification, comorbidities and participant's personal goals.       Expected  Outcomes Short Term: Increase workloads from initial exercise prescription for resistance, speed, and METs.;Short Term: Perform resistance training exercises routinely during rehab and add in resistance training at home;Long Term: Improve cardiorespiratory fitness, muscular endurance and strength as measured by increased METs and functional capacity ( )       Able to understand and use rate of perceived exertion (RPE) scale Yes       Intervention Provide education and explanation on how to use RPE scale       Expected Outcomes Short Term: Able to use RPE daily in rehab to express subjective intensity level;Long Term:  Able to use RPE to guide intensity level when exercising independently       Able to understand and use Dyspnea scale Yes       Intervention Provide education and explanation on how to  use Dyspnea scale       Expected Outcomes Short Term: Able to use Dyspnea scale daily in rehab to express subjective sense of shortness of breath during exertion;Long Term: Able to use Dyspnea scale to guide intensity level when exercising independently       Knowledge and understanding of Target Heart Rate Range (THRR) Yes       Intervention Provide education and explanation of THRR including how the numbers were predicted and where they are located for reference       Expected Outcomes Short Term: Able to state/look up THRR;Long Term: Able to use THRR to govern intensity when exercising independently;Short Term: Able to use daily as guideline for intensity in rehab       Able to check pulse independently Yes       Intervention Provide education and demonstration on how to check pulse in carotid and radial arteries.;Review the importance of being able to check your own pulse for safety during independent exercise       Expected Outcomes Short Term: Able to explain why pulse checking is important during independent exercise;Long Term: Able to check pulse independently and accurately       Understanding of  Exercise Prescription Yes       Intervention Provide education, explanation, and written materials on patient's individual exercise prescription       Expected Outcomes Short Term: Able to explain program exercise prescription;Long Term: Able to explain home exercise prescription to exercise independently          Exercise Goals Re-Evaluation :  Exercise Goals Re-Evaluation     Row Name 08/05/23 1356 08/13/23 1142 08/28/23 1148 09/11/23 0823       Exercise Goal Re-Evaluation   Exercise Goals Review Increase Physical Activity;Able to understand and use rate of perceived exertion (RPE) scale;Knowledge and understanding of Target Heart Rate Range (THRR);Understanding of Exercise Prescription Increase Physical Activity;Increase Strength and Stamina;Understanding of Exercise Prescription Increase Physical Activity;Increase Strength and Stamina;Understanding of Exercise Prescription Increase Physical Activity;Increase Strength and Stamina;Understanding of Exercise Prescription    Comments Reviewed RPE and dyspnea scale, THR and program prescription with pt today.  Pt voiced understanding and was given a copy of goals to take home. Lisa Fuentes is off to a great start in the program. She has been able to attend her first two sessions during this review period. During her sessions she was able to increase from level 2 to 5 on the T4 nustep, and increase from level 1 to 4 on the recumbent bike. We will continue to monitor her progress in the program. Lisa Fuentes is doing well in the program. She recently was able to increase from level 4 to 5 on the recumbent bike. She was also able to maintain level 5 on the T4 nustep. We will continue to monitor her progress in the program. Lisa Fuentes was unable to attend rehab during this review period. We will continue to reach out in order to determine her status in the program.    Expected Outcomes Short: Use RPE daily to regulate intensity.  Long: Follow program prescription in THR.  Short: Continue to follow exercise prescription. Long: Continue exercise to improve strength and stamina. Short: Continue to increase level on the recumbent bike. Long: Continue exercise to improve strength and stamina. Short: Return to rehab. Long: Continue exercise to improve strength and stamina.       Discharge Exercise Prescription (Final Exercise Prescription Changes):  Exercise Prescription Changes - 08/28/23 1100  Response to Exercise   Blood Pressure (Admit) 104/62    Blood Pressure (Exercise) 128/62    Blood Pressure (Exit) 98/70    Heart Rate (Admit) 83 bpm    Heart Rate (Exercise) 109 bpm    Heart Rate (Exit) 93 bpm    Rating of Perceived Exertion (Exercise) 13    Symptoms none    Duration Progress to 30 minutes of  aerobic without signs/symptoms of physical distress    Intensity THRR unchanged      Progression   Progression Continue to progress workloads to maintain intensity without signs/symptoms of physical distress.    Average METs 3.38      Resistance Training   Training Prescription Yes    Weight 2 lb, 3 lb    Reps 10-15      Interval Training   Interval Training No      Recumbant Bike   Level 5    Watts 15    Minutes 15    METs 4.2      NuStep   Level 5    Minutes 15    METs 4      Oxygen   Maintain Oxygen Saturation 88% or higher          Nutrition:  Target Goals: Understanding of nutrition guidelines, daily intake of sodium 1500mg , cholesterol 200mg , calories 30% from fat and 7% or less from saturated fats, daily to have 5 or more servings of fruits and vegetables.  Education: Nutrition 1 -Group instruction provided by verbal, written material, interactive activities, discussions, models, and posters to present general guidelines for heart healthy nutrition including macronutrients, label reading, and promoting whole foods over processed counterparts. Education serves as Pensions consultant of discussion of heart healthy eating for all.  Written material provided at class time.    Education: Nutrition 2 -Group instruction provided by verbal, written material, interactive activities, discussions, models, and posters to present general guidelines for heart healthy nutrition including sodium, cholesterol, and saturated fat. Providing guidance of habit forming to improve blood pressure, cholesterol, and body weight. Written material provided at class time.     Biometrics:  Pre Biometrics - 07/31/23 1129       Pre Biometrics   Height 5' 8 (1.727 m)    Weight 227 lb 12.8 oz (103.3 kg)    Waist Circumference 43 inches    Hip Circumference 46 inches    Waist to Hip Ratio 0.93 %    BMI (Calculated) 34.64    Single Leg Stand 2.9 seconds           Nutrition Therapy Plan and Nutrition Goals:  Nutrition Therapy & Goals - 07/31/23 1121       Nutrition Therapy   RD appointment deferred Yes      Intervention Plan   Intervention Prescribe, educate and counsel regarding individualized specific dietary modifications aiming towards targeted core components such as weight, hypertension, lipid management, diabetes, heart failure and other comorbidities.    Expected Outcomes Short Term Goal: Understand basic principles of dietary content, such as calories, fat, sodium, cholesterol and nutrients.;Short Term Goal: A plan has been developed with personal nutrition goals set during dietitian appointment.;Long Term Goal: Adherence to prescribed nutrition plan.          Nutrition Assessments:  MEDIFICTS Score Key: >=70 Need to make dietary changes  40-70 Heart Healthy Diet <= 40 Therapeutic Level Cholesterol Diet   Picture Your Plate Scores: <59 Unhealthy dietary pattern with much room for improvement. 41-50 Dietary  pattern unlikely to meet recommendations for good health and room for improvement. 51-60 More healthful dietary pattern, with some room for improvement.  >60 Healthy dietary pattern, although there may be some  specific behaviors that could be improved.    Nutrition Goals Re-Evaluation:   Nutrition Goals Discharge (Final Nutrition Goals Re-Evaluation):   Psychosocial: Target Goals: Acknowledge presence or absence of significant depression and/or stress, maximize coping skills, provide positive support system. Participant is able to verbalize types and ability to use techniques and skills needed for reducing stress and depression.   Education: Stress, Anxiety, and Depression - Group verbal and visual presentation to define topics covered.  Reviews how body is impacted by stress, anxiety, and depression.  Also discusses healthy ways to reduce stress and to treat/manage anxiety and depression. Written material provided at class time.   Education: Sleep Hygiene -Provides group verbal and written instruction about how sleep can affect your health.  Define sleep hygiene, discuss sleep cycles and impact of sleep habits. Review good sleep hygiene tips.   Initial Review & Psychosocial Screening:  Initial Psych Review & Screening - 07/30/23 1545       Initial Review   Current issues with Current Depression;History of Depression;Current Stress Concerns;Current Psychotropic Meds    Source of Stress Concerns Family;Unable to participate in former interests or hobbies;Unable to perform yard/household activities      Family Dynamics   Good Support System? Yes   family     Barriers   Psychosocial barriers to participate in program There are no identifiable barriers or psychosocial needs.;The patient should benefit from training in stress management and relaxation.      Screening Interventions   Interventions Encouraged to exercise;To provide support and resources with identified psychosocial needs;Provide feedback about the scores to participant;Program counselor consult    Expected Outcomes Short Term goal: Utilizing psychosocial counselor, staff and physician to assist with identification of specific  Stressors or current issues interfering with healing process. Setting desired goal for each stressor or current issue identified.;Long Term Goal: Stressors or current issues are controlled or eliminated.;Short Term goal: Identification and review with participant of any Quality of Life or Depression concerns found by scoring the questionnaire.;Long Term goal: The participant improves quality of Life and PHQ9 Scores as seen by post scores and/or verbalization of changes          Quality of Life Scores:   Quality of Life - 08/05/23 1435       Quality of Life   Select Quality of Life      Quality of Life Scores   Health/Function Pre 19.11 %    Socioeconomic Pre 22.5 %    Psych/Spiritual Pre 24 %    Family Pre 21 %    GLOBAL Pre 21.07 %         Scores of 19 and below usually indicate a poorer quality of life in these areas.  A difference of  2-3 points is a clinically meaningful difference.  A difference of 2-3 points in the total score of the Quality of Life Index has been associated with significant improvement in overall quality of life, self-image, physical symptoms, and general health in studies assessing change in quality of life.  PHQ-9: Review Flowsheet       07/31/2023 11/02/2022  Depression screen PHQ 2/9  Decreased Interest 1 0  Down, Depressed, Hopeless 1 0  PHQ - 2 Score 2 0  Altered sleeping 3 -  Tired, decreased energy 3 -  Change in appetite 0 -  Feeling bad or failure about yourself  0 -  Trouble concentrating 1 -  Moving slowly or fidgety/restless 0 -  Suicidal thoughts 0 -  PHQ-9 Score 9 -  Difficult doing work/chores Somewhat difficult -   Interpretation of Total Score  Total Score Depression Severity:  1-4 = Minimal depression, 5-9 = Mild depression, 10-14 = Moderate depression, 15-19 = Moderately severe depression, 20-27 = Severe depression   Psychosocial Evaluation and Intervention:  Psychosocial Evaluation - 07/30/23 1557       Psychosocial  Evaluation & Interventions   Interventions Encouraged to exercise with the program and follow exercise prescription;Stress management education;Relaxation education    Comments Lisa Fuentes is coming to cardiac rehab with systolic HF. She states she has been in poor health for a while now, unable to do do the things she once did without feeling short of breath or very tired. She notes she has become weaker and is dealing with rotator cuff issues after her last fall. She is going to PT for her shoulder and wants to start this program to see if she can increase her stamina. She is on medication for depression and notes that she has had multiple deaths in the family over the recent years, including her son, that have worsened her depression. Her medication helps some but some days when it gets too bad, she lays in the bed. 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    Expected Outcomes Short: attend cardiac rehab for education and exercise  Long: develop and maintain positive self care habits.    Continue Psychosocial Services  Follow up required by staff          Psychosocial Re-Evaluation:   Psychosocial Discharge (Final Psychosocial Re-Evaluation):   Vocational Rehabilitation: Provide vocational rehab assistance to qualifying candidates.   Vocational Rehab Evaluation & Intervention:  Vocational Rehab - 07/30/23 1545       Initial Vocational Rehab Evaluation & Intervention   Assessment shows need for Vocational Rehabilitation No          Education: Education Goals: Education classes will be provided on a variety of topics geared toward better understanding of heart health and risk factor modification. Participant will state understanding/return demonstration of topics presented as noted by education test scores.  Learning Barriers/Preferences:  Learning Barriers/Preferences - 07/30/23  1544       Learning Barriers/Preferences   Learning Barriers None    Learning Preferences None          General Cardiac Education Topics:  AED/CPR: - Group verbal and written instruction with the use of models to demonstrate the basic use of the AED with the basic ABC's of resuscitation.   Test and Procedures: - Group verbal and visual presentation and models provide information about basic cardiac anatomy and function. Reviews the testing methods done to diagnose heart disease and the outcomes of the test results. Describes the treatment choices: Medical Management, Angioplasty, or Coronary Bypass Surgery for treating various heart conditions including Myocardial Infarction, Angina, Valve Disease, and Cardiac Arrhythmias. Written material provided at class time.   Medication Safety: - Group verbal and visual instruction to review commonly prescribed medications for heart and lung disease. Reviews the medication, class of the drug, and side effects. Includes the steps to properly store meds and maintain the prescription regimen. Written material provided at class time.  Intimacy: - Group verbal instruction through game format to discuss how heart and lung disease can affect sexual intimacy. Written material provided at class time.   Know Your Numbers and Heart Failure: - Group verbal and visual instruction to discuss disease risk factors for cardiac and pulmonary disease and treatment options.  Reviews associated critical values for Overweight/Obesity, Hypertension, Cholesterol, and Diabetes.  Discusses basics of heart failure: signs/symptoms and treatments.  Introduces Heart Failure Zone chart for action plan for heart failure. Written material provided at class time.   Infection Prevention: - Provides verbal and written material to individual with discussion of infection control including proper hand washing and proper equipment cleaning during exercise session. Flowsheet Row  Cardiac Rehab from 07/31/2023 in [facility] Cardiac and Pulmonary Rehab  Date 07/31/23  Educator NT  Instruction Review Code 1- Verbalizes Understanding    Falls Prevention: - Provides verbal and written material to individual with discussion of falls prevention and safety. Flowsheet Row Cardiac Rehab from 07/31/2023 in [facility] Cardiac and Pulmonary Rehab  Date 07/31/23  Educator NT  Instruction Review Code 1- Verbalizes Understanding    Other: -Provides group and verbal instruction on various topics (see comments)   Knowledge Questionnaire Score:  Knowledge Questionnaire Score - 08/05/23 1435       Knowledge Questionnaire Score   Pre Score 22/26          Core Components/Risk Factors/Patient Goals at Admission:  Personal Goals and Risk Factors at Admission - 07/30/23 1605       Core Components/Risk Factors/Patient Goals on Admission   Heart Failure Yes    Intervention Provide a combined exercise and nutrition program that is supplemented with education, support and counseling about heart failure. Directed toward relieving symptoms such as shortness of breath, decreased exercise tolerance, and extremity edema.    Expected Outcomes Improve functional capacity of life;Short term: Attendance in program 2-3 days a week with increased exercise capacity. Reported lower sodium intake. Reported increased fruit and vegetable intake. Reports medication compliance.;Short term: Daily weights obtained and reported for increase. Utilizing diuretic protocols set by physician.;Long term: Adoption of self-care skills and reduction of barriers for early signs and symptoms recognition and intervention leading to self-care maintenance.    Hypertension Yes    Intervention Provide education on lifestyle modifcations including regular physical activity/exercise, weight management, moderate sodium restriction and increased consumption of fresh fruit, vegetables, and low fat dairy, alcohol moderation, and smoking  cessation.;Monitor prescription use compliance.    Expected Outcomes Short Term: Continued assessment and intervention until BP is < 140/14mm HG in hypertensive participants. < 130/34mm HG in hypertensive participants with diabetes, heart failure or chronic kidney disease.;Long Term: Maintenance of blood pressure at goal levels.          Education:Diabetes - Individual verbal and written instruction to review signs/symptoms of diabetes, desired ranges of glucose level fasting, after meals and with exercise. Acknowledge that pre and post exercise glucose checks will be done for 3 sessions at entry of program.   Core Components/Risk Factors/Patient Goals Review:    Core Components/Risk Factors/Patient Goals at Discharge (Final Review):    ITP Comments:  ITP Comments     Row Name 07/30/23 1605 07/31/23 1120 08/05/23 1355 08/28/23 1011 09/18/23 1127   ITP Comments Initial phone call completed. Diagnosis can be found in [facility] 6/20. EP Orientation scheduled for Wednesday 7/2 at 9:30. Completed and gym orientation for cardiac rehab. Initial ITP created and sent for review to Dr. Oneil Pinal, Medical Director.  First full day of exercise!  Patient was oriented to gym and equipment including functions, settings, policies, and procedures.  Patient's individual exercise prescription and treatment plan were reviewed.  All starting workloads were established based on the results of the 6 minute walk test done at initial orientation visit.  The plan for exercise progression was also introduced and progression will be customized based on patient's performance and goals. 30 Day review completed. Medical Director ITP review done, changes made as directed, and signed approval by Medical Director.  New to program. Patient requesting to be discharged at this time due to health concerns and too many appointments coming up. She completed 5 of 36 sessions      Comments: Early Discharge ITP

## 2023-09-18 NOTE — Progress Notes (Signed)
 Early Discharge Summary  Lisa Fuentes 08-13-1960  Lisa Fuentes is requesting to be discharged at this time due to health concerns and too many appointments coming up. She completed 5 of 36 sessions    6 Minute Walk     Row Name 07/31/23 1122         6 Minute Walk   Phase Initial     Distance 780 feet     Walk Time 5.5 minutes     # of Rest Breaks 1     MPH 1.61     METS 2.16     RPE 15     Perceived Dyspnea  2     VO2 Peak 7.56     Symptoms Yes (comment)     Comments fatigue     Resting HR 93 bpm     Resting BP 98/58     Resting Oxygen Saturation  97 %     Exercise Oxygen Saturation  during 6 min walk 97 %     Max Ex. HR 111 bpm     Max Ex. BP 122/66     2 Minute Post BP 102/60

## 2023-09-19 ENCOUNTER — Encounter

## 2023-09-19 ENCOUNTER — Ambulatory Visit (HOSPITAL_COMMUNITY): Attending: Internal Medicine

## 2023-09-19 DIAGNOSIS — Z95810 Presence of automatic (implantable) cardiac defibrillator: Secondary | ICD-10-CM | POA: Diagnosis not present

## 2023-09-19 DIAGNOSIS — I420 Dilated cardiomyopathy: Secondary | ICD-10-CM | POA: Diagnosis not present

## 2023-09-19 DIAGNOSIS — I5022 Chronic systolic (congestive) heart failure: Secondary | ICD-10-CM

## 2023-09-19 DIAGNOSIS — I5042 Chronic combined systolic (congestive) and diastolic (congestive) heart failure: Secondary | ICD-10-CM | POA: Insufficient documentation

## 2023-09-19 LAB — CUP PACEART REMOTE DEVICE CHECK
Battery Remaining Longevity: 22 mo
Battery Voltage: 2.94 V
Brady Statistic RV Percent Paced: 0 %
Date Time Interrogation Session: 20250819001603
HighPow Impedance: 46 Ohm
HighPow Impedance: 57 Ohm
Implantable Lead Connection Status: 753985
Implantable Lead Implant Date: 20150508
Implantable Lead Location: 753860
Implantable Lead Model: 185
Implantable Lead Serial Number: 194163
Implantable Pulse Generator Implant Date: 20150508
Lead Channel Impedance Value: 513 Ohm
Lead Channel Impedance Value: 532 Ohm
Lead Channel Pacing Threshold Amplitude: 0.75 V
Lead Channel Pacing Threshold Pulse Width: 0.4 ms
Lead Channel Sensing Intrinsic Amplitude: 7.875 mV
Lead Channel Sensing Intrinsic Amplitude: 7.875 mV
Lead Channel Setting Pacing Amplitude: 2 V
Lead Channel Setting Pacing Pulse Width: 0.4 ms
Lead Channel Setting Sensing Sensitivity: 0.3 mV
Zone Setting Status: 755011

## 2023-09-20 DIAGNOSIS — I5040 Unspecified combined systolic (congestive) and diastolic (congestive) heart failure: Secondary | ICD-10-CM | POA: Diagnosis not present

## 2023-09-23 ENCOUNTER — Encounter

## 2023-09-25 ENCOUNTER — Encounter

## 2023-09-25 DIAGNOSIS — E039 Hypothyroidism, unspecified: Secondary | ICD-10-CM | POA: Diagnosis not present

## 2023-09-26 ENCOUNTER — Encounter

## 2023-09-26 ENCOUNTER — Ambulatory Visit: Admission: RE | Admit: 2023-09-26 | Source: Ambulatory Visit

## 2023-10-02 ENCOUNTER — Encounter

## 2023-10-02 DIAGNOSIS — M25511 Pain in right shoulder: Secondary | ICD-10-CM | POA: Diagnosis not present

## 2023-10-02 DIAGNOSIS — M25611 Stiffness of right shoulder, not elsewhere classified: Secondary | ICD-10-CM | POA: Diagnosis not present

## 2023-10-03 ENCOUNTER — Encounter

## 2023-10-04 DIAGNOSIS — M25611 Stiffness of right shoulder, not elsewhere classified: Secondary | ICD-10-CM | POA: Diagnosis not present

## 2023-10-04 DIAGNOSIS — M25511 Pain in right shoulder: Secondary | ICD-10-CM | POA: Diagnosis not present

## 2023-10-07 ENCOUNTER — Encounter

## 2023-10-07 DIAGNOSIS — M25511 Pain in right shoulder: Secondary | ICD-10-CM | POA: Diagnosis not present

## 2023-10-07 DIAGNOSIS — M25611 Stiffness of right shoulder, not elsewhere classified: Secondary | ICD-10-CM | POA: Diagnosis not present

## 2023-10-09 ENCOUNTER — Encounter

## 2023-10-09 DIAGNOSIS — G894 Chronic pain syndrome: Secondary | ICD-10-CM | POA: Diagnosis not present

## 2023-10-09 DIAGNOSIS — M533 Sacrococcygeal disorders, not elsewhere classified: Secondary | ICD-10-CM | POA: Diagnosis not present

## 2023-10-09 DIAGNOSIS — Z79899 Other long term (current) drug therapy: Secondary | ICD-10-CM | POA: Diagnosis not present

## 2023-10-09 DIAGNOSIS — M47817 Spondylosis without myelopathy or radiculopathy, lumbosacral region: Secondary | ICD-10-CM | POA: Diagnosis not present

## 2023-10-09 DIAGNOSIS — M25511 Pain in right shoulder: Secondary | ICD-10-CM | POA: Diagnosis not present

## 2023-10-09 DIAGNOSIS — M25611 Stiffness of right shoulder, not elsewhere classified: Secondary | ICD-10-CM | POA: Diagnosis not present

## 2023-10-10 ENCOUNTER — Encounter

## 2023-10-10 ENCOUNTER — Other Ambulatory Visit: Payer: Self-pay | Admitting: Neurosurgery

## 2023-10-10 DIAGNOSIS — M47817 Spondylosis without myelopathy or radiculopathy, lumbosacral region: Secondary | ICD-10-CM

## 2023-10-13 ENCOUNTER — Ambulatory Visit (HOSPITAL_COMMUNITY): Payer: Self-pay | Admitting: Internal Medicine

## 2023-10-14 ENCOUNTER — Encounter

## 2023-10-14 NOTE — Telephone Encounter (Signed)
 Pt aware and voiced understanding

## 2023-10-15 ENCOUNTER — Other Ambulatory Visit: Payer: Self-pay | Admitting: Cardiology

## 2023-10-15 DIAGNOSIS — I110 Hypertensive heart disease with heart failure: Secondary | ICD-10-CM | POA: Diagnosis not present

## 2023-10-15 DIAGNOSIS — I5020 Unspecified systolic (congestive) heart failure: Secondary | ICD-10-CM | POA: Diagnosis not present

## 2023-10-15 DIAGNOSIS — E039 Hypothyroidism, unspecified: Secondary | ICD-10-CM | POA: Diagnosis not present

## 2023-10-15 DIAGNOSIS — B372 Candidiasis of skin and nail: Secondary | ICD-10-CM | POA: Diagnosis not present

## 2023-10-15 DIAGNOSIS — I420 Dilated cardiomyopathy: Secondary | ICD-10-CM | POA: Diagnosis not present

## 2023-10-16 ENCOUNTER — Encounter

## 2023-10-17 ENCOUNTER — Encounter

## 2023-10-21 ENCOUNTER — Telehealth: Payer: Self-pay | Admitting: Internal Medicine

## 2023-10-21 ENCOUNTER — Encounter

## 2023-10-21 NOTE — Telephone Encounter (Signed)
 Called to confirm/remind patient of their appointment at the [facility] on 10/22/23.   Appointment:   [x] Confirmed  [] Left mess   [] No answer/No voice mail  [] VM Full/unable to leave message  [] Phone not in service  Patient reminded to bring all medications and/or complete list.  Confirmed patient has transportation. Gave directions, instructed to utilize valet parking.

## 2023-10-22 ENCOUNTER — Telehealth: Payer: Self-pay

## 2023-10-22 ENCOUNTER — Ambulatory Visit (HOSPITAL_BASED_OUTPATIENT_CLINIC_OR_DEPARTMENT_OTHER): Admitting: Internal Medicine

## 2023-10-22 ENCOUNTER — Ambulatory Visit
Admission: RE | Admit: 2023-10-22 | Discharge: 2023-10-22 | Disposition: A | Discharge status: Home / Self Care | Source: Ambulatory Visit | Attending: Neurosurgery | Admitting: Neurosurgery

## 2023-10-22 VITALS — BP 113/72 | HR 77 | Wt 215.0 lb

## 2023-10-22 DIAGNOSIS — I428 Other cardiomyopathies: Secondary | ICD-10-CM | POA: Insufficient documentation

## 2023-10-22 DIAGNOSIS — M51379 Other intervertebral disc degeneration, lumbosacral region without mention of lumbar back pain or lower extremity pain: Secondary | ICD-10-CM | POA: Diagnosis not present

## 2023-10-22 DIAGNOSIS — M47817 Spondylosis without myelopathy or radiculopathy, lumbosacral region: Secondary | ICD-10-CM | POA: Insufficient documentation

## 2023-10-22 DIAGNOSIS — G4733 Obstructive sleep apnea (adult) (pediatric): Secondary | ICD-10-CM | POA: Insufficient documentation

## 2023-10-22 DIAGNOSIS — Z95810 Presence of automatic (implantable) cardiac defibrillator: Secondary | ICD-10-CM | POA: Insufficient documentation

## 2023-10-22 DIAGNOSIS — I50810 Right heart failure, unspecified: Secondary | ICD-10-CM

## 2023-10-22 DIAGNOSIS — M47816 Spondylosis without myelopathy or radiculopathy, lumbar region: Secondary | ICD-10-CM | POA: Diagnosis not present

## 2023-10-22 DIAGNOSIS — I110 Hypertensive heart disease with heart failure: Secondary | ICD-10-CM | POA: Insufficient documentation

## 2023-10-22 DIAGNOSIS — Z6833 Body mass index (BMI) 33.0-33.9, adult: Secondary | ICD-10-CM | POA: Insufficient documentation

## 2023-10-22 DIAGNOSIS — I472 Ventricular tachycardia, unspecified: Secondary | ICD-10-CM

## 2023-10-22 DIAGNOSIS — M48061 Spinal stenosis, lumbar region without neurogenic claudication: Secondary | ICD-10-CM | POA: Diagnosis not present

## 2023-10-22 DIAGNOSIS — M4316 Spondylolisthesis, lumbar region: Secondary | ICD-10-CM | POA: Diagnosis not present

## 2023-10-22 DIAGNOSIS — Z79899 Other long term (current) drug therapy: Secondary | ICD-10-CM | POA: Insufficient documentation

## 2023-10-22 DIAGNOSIS — I5022 Chronic systolic (congestive) heart failure: Secondary | ICD-10-CM | POA: Diagnosis not present

## 2023-10-22 DIAGNOSIS — Z86718 Personal history of other venous thrombosis and embolism: Secondary | ICD-10-CM | POA: Insufficient documentation

## 2023-10-22 DIAGNOSIS — Z7901 Long term (current) use of anticoagulants: Secondary | ICD-10-CM | POA: Insufficient documentation

## 2023-10-22 DIAGNOSIS — M25611 Stiffness of right shoulder, not elsewhere classified: Secondary | ICD-10-CM | POA: Diagnosis not present

## 2023-10-22 DIAGNOSIS — I2699 Other pulmonary embolism without acute cor pulmonale: Secondary | ICD-10-CM

## 2023-10-22 DIAGNOSIS — Z86711 Personal history of pulmonary embolism: Secondary | ICD-10-CM | POA: Insufficient documentation

## 2023-10-22 DIAGNOSIS — M25511 Pain in right shoulder: Secondary | ICD-10-CM | POA: Diagnosis not present

## 2023-10-22 MED ORDER — ROSUVASTATIN CALCIUM 10 MG PO TABS: 10.0000 mg | ORAL_TABLET | Freq: Every day | ORAL | 3 refills | Status: DC

## 2023-10-22 MED ORDER — ROSUVASTATIN CALCIUM 20 MG PO TABS: 20.0000 mg | ORAL_TABLET | Freq: Every day | ORAL | 3 refills | Status: DC

## 2023-10-22 NOTE — Progress Notes (Unsigned)
 Pt called stating that after her appt today (10/22/23) with Dr. Bensimhon, she started having visible bilateral swelling in her legs. Pt denies any SOB. Just stated she is feeling more fatigue. Pt states her weight did not go up.  Pt currently taking Lasix  20 MG on Mondays, Wednesday and Fridays.  Please advise.

## 2023-10-22 NOTE — H&P (View-Only) (Signed)
 ADVANCED [facility] CLINIC  NOTE   Primary Care: Valora Agent, MD Primary Cardiologist: Redell Cave, MD  HF provider: Gardenia Led, MD   Chief complaint: Heart failure  HPI:  Lisa Fuentes is a 63  y.o. female with a hx of systolic HF due to NICM, VT s/p MDT ICD morbid obesity, hypertension, PE s/p endovascular thrombolysis 9/24 on Eliquis , OSA and RUE DVT (06/23). Diagnosed with systolic HF in 2000. Cath 2003 no CAD. Had repeat cath 2022 with normal cors.  Had RUE DVT 6/23 after trauma. Upper extremity u/s with multiple RUE DVTs. CTA chest LLL segmental and subsegmental pulmonary emboli without evidence of RV strain. Started on Eliquis . She completed several months of Eliquis , and it was then discontinued per hematology.   Admitted [facility] 10/01/2022. CTA chest with moderate volume of acute PE with RV strain. She was started on heparin . She underwent pulmonary thrombectomy. D/c'd on Eliquis .    Nuclear stress test on 10/03/2022,  LVEF 44% with moderate fixed basal septal defect without ischemia. Echo 10/03/22 EF 30-35% mild TR. Was in the ED 10/10/22 due to mid-sternal chest pain and shortness of breath. Labs normal.   Echo 7/25 EF 30-35% RV ok RVSP normal Personally reviewed   CPX test 8/25 FVC 2.84 (95%)      FEV1 2.48(105%)        FEV1/FVC 87 (109%)        MVV 132 (135%)   pVO2: 8.9 (51%) VE/VCO2 slope:  51 OUES: 0.6 (30% of predicted) Peak RER: 1.22 (see  note below about RER) Ventilatory Threshold: 6.4 37% predicted or measured peak VO2    Here for f/u. Says she gets SOB easily - can't get to mailbox. She is worried it is her thyroid . Mild LE edema. No orthopnea or PND. Dropped of CR after 4 weeks Compliant with meds including Eliquis .   ICD interrogation: No VT/AF volume ok activity level dropped 3hr/day -> 1.5hr/day    Cardiac studies:  Echo 02/18/14: EF 40-45% with Grade I DD Echo 02/22/17: EF 45-50% with Grade I DD Echo 03/24/19: EF 45-50% with mild LVH, Grade I DD,  borderline aortic root dilatation 37 mm Echo 03/21/20: EF 40-45% with Grade I DD, borderline aortic root dilatation 37 mm Echo 10/03/22: EF 30-35% with moderate LVH, Grade I DD, mild LAE  LHC 03/28/20:  1.  Normal coronary arteries. 2.  Left ventricular angiography was not performed.  EF was mildly reduced by echo. 3.  Mildly elevated left ventricular end-diastolic pressure 20 mmHg.   SH:  Social History   Socioeconomic History   Marital status: Married    Spouse name: Jimmy   Number of children: 2   Years of education: Not on file   Highest education level: Not on file  Occupational History   Not on file  Tobacco Use   Smoking status: Never   Smokeless tobacco: Never  Vaping Use   Vaping status: Never Used  Substance and Sexual Activity   Alcohol use: Yes    Alcohol/week: 0.0 standard drinks of alcohol    Comment: occassionally   Drug use: No   Sexual activity: Not Currently    Birth control/protection: Surgical  Other Topics Concern   Not on file  Social History Narrative   Not on file   Social Drivers of Health   Financial Resource Strain: Low Risk  (03/11/2023)   Received from [facility]   Overall Financial Resource Strain (CARDIA)    Difficulty of Paying  Living Expenses: Not hard at all  Food Insecurity: No Food Insecurity (08/27/2023)   Hunger Vital Sign    Worried About Running Out of Food in the Last Year: Never true    Ran Out of Food in the Last Year: Never true  Transportation Needs: No Transportation Needs (08/27/2023)   PRAPARE - Administrator, Civil Service (Medical): No    Lack of Transportation (Non-Medical): No  Physical Activity: Not on file  Stress: Not on file  Social Connections: Not on file  Intimate Partner Violence: Not At Risk (08/27/2023)   Humiliation, Afraid, Rape, and Kick questionnaire    Fear of Current or Ex-Partner: No    Emotionally Abused: No    Physically Abused: No    Sexually Abused: No    FH:   Family History  Problem Relation Age of Onset   Hypertension Mother    Hyperlipidemia Mother    Stroke Mother    Colon cancer Mother    Breast cancer Sister 81   Ovarian cancer Sister    Throat cancer Brother     Past Medical History:  Diagnosis Date   AICD (automatic cardioverter/defibrillator) present 2008   a.) Medtronic device placed 2008. b.) replaced 06/21/2013 (Medtronic Evera XT single-chamber ICD; model number [id number]).   Anemia    Anxiety    Aortic dilatation 03/24/2019   a.) TTE 03/24/2019: Ao root measured 37 mm. b.) TTE 03/21/2020: Ao root measured 37 mm.   Arthritis    Depression    Difficult intubation    DOE (dyspnea on exertion)    Endometriosis of vagina 09/2013   Intra-operative findings of endometriosis implants on cervical stump   Essential hypertension    GERD (gastroesophageal reflux disease)    Headache    HFrEF (heart failure with reduced ejection fraction) (HCC)    a.) TTE 02/18/2014: EF 40-45%; tiv AR, mild MR/TR/PR; G1DD. b.) TTE 02/22/2017: EF 45-50%; mild LVH; triv PR; G1DD. c.) TTE 03/24/2019: EF 45-50%; glob HK, mild LVH; triv MR/TR/PR. d.) TTE 03/21/2020: EF 40-45%; glob HK.   History of 2019 novel coronavirus disease (COVID-19) 02/02/2020   History of 2019 novel coronavirus disease (COVID-19) 02/02/2020   Hypokalemia    Hypothyroidism    a.) s/p radioactive iodine  Tx; on levothyroxine    Mixed hyperlipidemia    Nonischemic cardiomyopathy (HCC)    a.) TTE 02/18/2014: EF 40-45%. b.) TTE 02/22/2017: EF 45-50%. c.) TTE 03/24/2019: EF 45-50%. d.) TTE 03/21/2020: EF 40-45%   OSA on CPAP    Panic attacks    PSVT (paroxysmal supraventricular tachycardia) 03/30/2020   a.) Holter 03/30/2020 --> 2 runs; fastest lasting 4 beats (184 bpm); longest lasting 4 beats (113 bpm)   Pulmonary embolism (HCC)    Tachycardia     Current Outpatient Medications  Medication Sig Dispense Refill   Acetaminophen  (TYLENOL  ARTHRITIS PAIN PO) Take 1,300 mg by mouth  every 6 (six) hours as needed (Pain).     apixaban  (ELIQUIS ) 5 MG TABS tablet Take 5 mg by mouth 2 (two) times daily.     digoxin  (LANOXIN ) 0.125 MG tablet Take 0.5 tablets (0.0625 mg total) by mouth daily. Reduced from 0.125 mg. (Patient taking differently: Take 0.125 mg by mouth daily. Reduced from 0.125 mg.)     esomeprazole (NEXIUM) 40 MG capsule Take 40 mg by mouth every morning.      fluticasone (FLONASE) 50 MCG/ACT nasal spray Place 2 sprays into both nostrils daily.     furosemide  (LASIX ) 20 MG  tablet TAKE 1 TABLET BY MOUTH ON MONDAY, WEDNESDAY AND FRIDAY 36 tablet 3   gabapentin  (NEURONTIN ) 100 MG capsule Take 100 mg by mouth 3 (three) times daily.     levothyroxine  (SYNTHROID ) 125 MCG tablet Take 125 mcg by mouth every morning. (Patient taking differently: Take 150 mcg by mouth every morning.)     metoprolol  succinate (TOPROL -XL) 25 MG 24 hr tablet Take 0.5 tablets (12.5 mg total) by mouth daily. Reduced from 50 mg daily. (Patient taking differently: Take 25 mg by mouth daily. Reduced from 50 mg daily.) 15 tablet 2   oxyCODONE -acetaminophen  (PERCOCET/ROXICET) 5-325 MG tablet Take 1 tablet by mouth 3 (three) times daily as needed for severe pain (pain score 7-10). Home med.     rosuvastatin  (CRESTOR ) 5 MG tablet Take 1 tablet (5 mg total) by mouth daily. 90 tablet 3   sacubitril -valsartan  (ENTRESTO ) 24-26 MG Take 1 tablet by mouth 2 (two) times daily. (Patient taking differently: Take 1 tablet by mouth 2 (two) times daily.)     sertraline  (ZOLOFT ) 100 MG tablet Take 200 mg by mouth daily.     spironolactone  (ALDACTONE ) 25 MG tablet Take 1 tablet (25 mg total) by mouth daily. 90 tablet 3   tirzepatide  (MOUNJARO ) 5 MG/0.5ML Pen Inject 5 mg into the skin once a week. 2 mL 1   No current facility-administered medications for this visit.   Vitals:   10/22/23 1000  BP: 113/72  Pulse: 77  SpO2: 100%  Weight: 215 lb (97.5 kg)    Wt Readings from Last 3 Encounters:  10/22/23 215 lb (97.5  kg)  09/11/23 218 lb (98.9 kg)  09/06/23 218 lb (98.9 kg)   Lab Results  Component Value Date   CREATININE 0.68 09/11/2023   CREATININE 0.90 08/30/2023   CREATININE 1.05 (H) 08/29/2023   PHYSICAL EXAM: Vitals:   10/22/23 1000  BP: 113/72  Pulse: 77  SpO2: 100%   General:  Well appearing. No resp difficulty HEENT: normal Neck: supple. no JVD. Carotids 2+ bilat; no bruits. No lymphadenopathy or thryomegaly appreciated. Cor: PMI nondisplaced. Regular rate & rhythm. No rubs, gallops or murmurs. Lungs: clear Abdomen: obese soft, nontender, nondistended. No hepatosplenomegaly. No bruits or masses. Good bowel sounds. Extremities: no cyanosis, clubbing, rash, edema Neuro: alert & orientedx3, cranial nerves grossly intact. moves all 4 extremities w/o difficulty. Affect pleasant  ASSESSMENT & PLAN:  1. NICM with reduced ejection fraction- - cath 2003 and 2022 no CAD - Echo 02/18/14: EF 40-45% with Grade I DD - Echo 02/22/17: EF 45-50% with Grade I DD - Echo 03/24/19: EF 45-50% with mild LVH, Grade I DD, borderline aortic root dilatation 37 mm  -Echo 03/21/20: EF 40-45% with Grade I DD, borderline aortic root dilatation 37 mm - Echo 10/03/22: EF 30-35% with moderate LVH, Grade I DD, mild LAE - 12/13/22: Echo LVEF 25-30%, normal RV function.  - 12/24 Myoview   EF 40% - Echo 7/25 EF 30-35% RV ok RVSP normal Personally reviewed - CPX 8/25: pVO2: 8.9 (51%) VE/VCO2 slope:  51 OUES: 0.6 (30% of predicted) Peak RER: 1.22 (see  note below about RER) Ventilatory Threshold: 6.4 37% predicted or measured peak VO2  - NYHA IIIB  - continue entresto  24/26mg  BID - continues to take lasix  20mg  every other day; euvolemic on exam today.  - continue spironolactone  25mg  daily - continue metoprolol  succinate 50 mg daily - continue digoxin  0.125 daily - will continue to hold SGLT2i; - CPX testing and symptoms seem out of proportion  to echo findings. CPX suggests need for advanced therapies Will need RHC to  further evaluate. Will also get VQ to assess for CTEPH  2. RV Failure- - TTE from 10/03/22 with at least moderately reduced RV function 2/2 acute PE; suspect this has improved after thrombectomy.  - TTE 12/13/22 w/ LVEF 25%-30%, with low normal RV function now. Euvolemic on exam with no clinical signs of RV failure - Echo 7/25 EF 30-35% RV ok RVSP normal - Check VQ as above   3. H/o VT- - s/p MDT ICD - Device interrogation as above. No further VT  4. Recurrent DVT/PE- - s/p thrombectomy - 12/13/22: Echo LVEF 25-30%, normal RV function.  - continue lifelong Eliquis  -> after 1 year can decrease to 2.5 bid per Amplify-EXT data - No evidence of RV strain on echo  - Check VQ and RHC  5. Morbid obesity- - Body mass index is Body mass index is 33.67 kg/m. - On Mounjao  6. OSA - compliant with OSA  I spent a total of 50 minutes today: 1) reviewing the patient's medical records including previous charts, labs and recent notes from other providers; 2) examining the patient and counseling them on their medical issues/explaining the plan of care; 3) adjusting meds as needed and 4) ordering lab work or other needed tests.    Toribio Fuel, MD  10:34 AM

## 2023-10-22 NOTE — Patient Instructions (Addendum)
 Medication Changes:  INCREASE Rosuvastatin  20mg  daily  Lab Work:   Go downstairs to [place] on LOWER LEVEL to have your blood work completed.  We will only call you if the results are abnormal or if the provider would like to make medication changes.  No news is good news.   Testing/Procedures:   [facility] [address] [place] [address] Dept: [phone]  Lisa Fuentes  10/22/2023  You are scheduled for a Cardiac Catheterization on Wednesday, October 1 with Dr. Toribio Fuel.  1. Please arrive at the [facility] (Main Entrance A) at [facility]: [address] [place], [place] [address] at 9:00 AM (This time is 2  hour(s) before your procedure to ensure your preparation).   Free valet parking service is available. You will check in at ADMITTING. The support person will be asked to wait in the waiting room.  It is OK to have someone drop you off and come back when you are ready to be discharged.    Special note: Every effort is made to have your procedure done on time. Please understand that emergencies sometimes delay scheduled procedures.  2. Diet: Nothing to eat after midnight.   3. Hydration: On October 1, you may drink approved liquids (see below) until 2 hours before the procedure with 8 oz of water as your last intake.   List of approved liquids water, clear juice, clear tea, black coffee, fruit juices, non-citric and without pulp, carbonated beverages, Gatorade, Kool -Aid, plain Jello-O and plain ice popsicles.  4. Labs: You will need to have blood drawn on TODAY  5. Medication instructions in preparation for your procedure:   Do not take the morning of your procedure: Furosemide , Spironolactone  or Eliquis .   Contrast Allergy: No      On the morning of your procedure, take your Aspirin  81 mg and any morning medicines NOT listed above.  You may  use sips of water.  6. Plan to go home the same day, you will only stay overnight if medically necessary. 7. Bring a current list of your medications and current insurance cards. 8. You MUST have a responsible person to drive you home. 9. Someone MUST be with you the first 24 hours after you arrive home or your discharge will be delayed. 10. Please wear clothes that are easy to get on and off and wear slip-on shoes.  Thank you for allowing us  to care for you!   -- [place] [facility]   Special Instructions // Education:  There has been a vq scan and a chest xray placed for you. It is scheduled for 10/23/23 at 2:00pm. Please report to the [facility] at 1:15pm to check in for both.  Follow-Up in: Please follow up with the [facility] in 2 months with Dr. Fuel.    Thank you for choosing [facility].    At the [facility], you and your health needs are our priority. We have a designated team specialized in the treatment of Heart Failure. This Care Team includes your primary Heart Failure Specialized Cardiologist (physician), Advanced Practice Providers (APPs- Physician Assistants and Nurse Practitioners), and Pharmacist who all work together to provide you with the care you need, when you need it.   You may see any of the following providers on your designated Care Team at your next follow up:  Dr. Toribio Fuel Dr. Ezra Shuck Dr. Ria Commander Dr. Morene Brownie Ellouise Class, FNP Jaun Bash, RPH-CPP  Please be sure to bring in all your medications bottles to every appointment.   Need to Contact Us :  If you have any questions or concerns before your next appointment please send us  a message through [place] or call our office at [phone].    TO LEAVE A MESSAGE FOR THE NURSE SELECT OPTION 2, PLEASE LEAVE A MESSAGE INCLUDING: YOUR NAME DATE OF BIRTH CALL BACK  NUMBER REASON FOR CALL**this is important as we prioritize the call backs  YOU WILL RECEIVE A CALL BACK THE SAME DAY AS LONG AS YOU CALL BEFORE 4:00 PM

## 2023-10-22 NOTE — Progress Notes (Signed)
 [facility]  NOTE   Primary Care: Valora Agent, MD Primary Cardiologist: Redell Cave, MD  HF provider: Gardenia Led, MD   Chief complaint: Heart failure  HPI:  Lisa Fuentes is a 63  y.o. female with a hx of systolic HF due to NICM, VT s/p MDT ICD morbid obesity, hypertension, PE s/p endovascular thrombolysis 9/24 on Eliquis , OSA and RUE DVT (06/23). Diagnosed with systolic HF in 2000. Cath 2003 no CAD. Had repeat cath 2022 with normal cors.  Had RUE DVT 6/23 after trauma. Upper extremity u/s with multiple RUE DVTs. CTA chest LLL segmental and subsegmental pulmonary emboli without evidence of RV strain. Started on Eliquis . She completed several months of Eliquis , and it was then discontinued per hematology.   Admitted [facility] 10/01/2022. CTA chest with moderate volume of acute PE with RV strain. She was started on heparin . She underwent pulmonary thrombectomy. D/c'd on Eliquis .    Nuclear stress test on 10/03/2022,  LVEF 44% with moderate fixed basal septal defect without ischemia. Echo 10/03/22 EF 30-35% mild TR. Was in the ED 10/10/22 due to mid-sternal chest pain and shortness of breath. Labs normal.   Echo 7/25 EF 30-35% RV ok RVSP normal Personally reviewed   CPX test 8/25 FVC 2.84 (95%)      FEV1 2.48(105%)        FEV1/FVC 87 (109%)        MVV 132 (135%)   pVO2: 8.9 (51%) VE/VCO2 slope:  51 OUES: 0.6 (30% of predicted) Peak RER: 1.22 (see  note below about RER) Ventilatory Threshold: 6.4 37% predicted or measured peak VO2    Here for f/u. Says she gets SOB easily - can't get to mailbox. She is worried it is her thyroid . Mild LE edema. No orthopnea or PND. Dropped of CR after 4 weeks Compliant with meds including Eliquis .   ICD interrogation: No VT/AF volume ok activity level dropped 3hr/day -> 1.5hr/day    Cardiac studies:  Echo 02/18/14: EF 40-45% with Grade I DD Echo 02/22/17: EF 45-50% with Grade I DD Echo 03/24/19: EF 45-50% with mild LVH, Grade I DD,  borderline aortic root dilatation 37 mm Echo 03/21/20: EF 40-45% with Grade I DD, borderline aortic root dilatation 37 mm Echo 10/03/22: EF 30-35% with moderate LVH, Grade I DD, mild LAE  LHC 03/28/20:  1.  Normal coronary arteries. 2.  Left ventricular angiography was not performed.  EF was mildly reduced by echo. 3.  Mildly elevated left ventricular end-diastolic pressure 20 mmHg.   SH:  Social History   Socioeconomic History   Marital status: Married    Spouse name: Jimmy   Number of children: 2   Years of education: Not on file   Highest education level: Not on file  Occupational History   Not on file  Tobacco Use   Smoking status: Never   Smokeless tobacco: Never  Vaping Use   Vaping status: Never Used  Substance and Sexual Activity   Alcohol use: Yes    Alcohol/week: 0.0 standard drinks of alcohol    Comment: occassionally   Drug use: No   Sexual activity: Not Currently    Birth control/protection: Surgical  Other Topics Concern   Not on file  Social History Narrative   Not on file   Social Drivers of Health   Financial Resource Strain: Low Risk  (03/11/2023)   Received from [facility]   Overall Financial Resource Strain (CARDIA)    Difficulty of Paying  Living Expenses: Not hard at all  Food Insecurity: No Food Insecurity (08/27/2023)   Hunger Vital Sign    Worried About Running Out of Food in the Last Year: Never true    Ran Out of Food in the Last Year: Never true  Transportation Needs: No Transportation Needs (08/27/2023)   PRAPARE - Administrator, Civil Service (Medical): No    Lack of Transportation (Non-Medical): No  Physical Activity: Not on file  Stress: Not on file  Social Connections: Not on file  Intimate Partner Violence: Not At Risk (08/27/2023)   Humiliation, Afraid, Rape, and Kick questionnaire    Fear of Current or Ex-Partner: No    Emotionally Abused: No    Physically Abused: No    Sexually Abused: No    FH:   Family History  Problem Relation Age of Onset   Hypertension Mother    Hyperlipidemia Mother    Stroke Mother    Colon cancer Mother    Breast cancer Sister 81   Ovarian cancer Sister    Throat cancer Brother     Past Medical History:  Diagnosis Date   AICD (automatic cardioverter/defibrillator) present 2008   a.) Medtronic device placed 2008. b.) replaced 06/21/2013 (Medtronic Evera XT single-chamber ICD; model number [id number]).   Anemia    Anxiety    Aortic dilatation 03/24/2019   a.) TTE 03/24/2019: Ao root measured 37 mm. b.) TTE 03/21/2020: Ao root measured 37 mm.   Arthritis    Depression    Difficult intubation    DOE (dyspnea on exertion)    Endometriosis of vagina 09/2013   Intra-operative findings of endometriosis implants on cervical stump   Essential hypertension    GERD (gastroesophageal reflux disease)    Headache    HFrEF (heart failure with reduced ejection fraction) (HCC)    a.) TTE 02/18/2014: EF 40-45%; tiv AR, mild MR/TR/PR; G1DD. b.) TTE 02/22/2017: EF 45-50%; mild LVH; triv PR; G1DD. c.) TTE 03/24/2019: EF 45-50%; glob HK, mild LVH; triv MR/TR/PR. d.) TTE 03/21/2020: EF 40-45%; glob HK.   History of 2019 novel coronavirus disease (COVID-19) 02/02/2020   History of 2019 novel coronavirus disease (COVID-19) 02/02/2020   Hypokalemia    Hypothyroidism    a.) s/p radioactive iodine  Tx; on levothyroxine    Mixed hyperlipidemia    Nonischemic cardiomyopathy (HCC)    a.) TTE 02/18/2014: EF 40-45%. b.) TTE 02/22/2017: EF 45-50%. c.) TTE 03/24/2019: EF 45-50%. d.) TTE 03/21/2020: EF 40-45%   OSA on CPAP    Panic attacks    PSVT (paroxysmal supraventricular tachycardia) 03/30/2020   a.) Holter 03/30/2020 --> 2 runs; fastest lasting 4 beats (184 bpm); longest lasting 4 beats (113 bpm)   Pulmonary embolism (HCC)    Tachycardia     Current Outpatient Medications  Medication Sig Dispense Refill   Acetaminophen  (TYLENOL  ARTHRITIS PAIN PO) Take 1,300 mg by mouth  every 6 (six) hours as needed (Pain).     apixaban  (ELIQUIS ) 5 MG TABS tablet Take 5 mg by mouth 2 (two) times daily.     digoxin  (LANOXIN ) 0.125 MG tablet Take 0.5 tablets (0.0625 mg total) by mouth daily. Reduced from 0.125 mg. (Patient taking differently: Take 0.125 mg by mouth daily. Reduced from 0.125 mg.)     esomeprazole (NEXIUM) 40 MG capsule Take 40 mg by mouth every morning.      fluticasone (FLONASE) 50 MCG/ACT nasal spray Place 2 sprays into both nostrils daily.     furosemide  (LASIX ) 20 MG  tablet TAKE 1 TABLET BY MOUTH ON MONDAY, WEDNESDAY AND FRIDAY 36 tablet 3   gabapentin  (NEURONTIN ) 100 MG capsule Take 100 mg by mouth 3 (three) times daily.     levothyroxine  (SYNTHROID ) 125 MCG tablet Take 125 mcg by mouth every morning. (Patient taking differently: Take 150 mcg by mouth every morning.)     metoprolol  succinate (TOPROL -XL) 25 MG 24 hr tablet Take 0.5 tablets (12.5 mg total) by mouth daily. Reduced from 50 mg daily. (Patient taking differently: Take 25 mg by mouth daily. Reduced from 50 mg daily.) 15 tablet 2   oxyCODONE -acetaminophen  (PERCOCET/ROXICET) 5-325 MG tablet Take 1 tablet by mouth 3 (three) times daily as needed for severe pain (pain score 7-10). Home med.     rosuvastatin  (CRESTOR ) 5 MG tablet Take 1 tablet (5 mg total) by mouth daily. 90 tablet 3   sacubitril -valsartan  (ENTRESTO ) 24-26 MG Take 1 tablet by mouth 2 (two) times daily. (Patient taking differently: Take 1 tablet by mouth 2 (two) times daily.)     sertraline  (ZOLOFT ) 100 MG tablet Take 200 mg by mouth daily.     spironolactone  (ALDACTONE ) 25 MG tablet Take 1 tablet (25 mg total) by mouth daily. 90 tablet 3   tirzepatide  (MOUNJARO ) 5 MG/0.5ML Pen Inject 5 mg into the skin once a week. 2 mL 1   No current facility-administered medications for this visit.   Vitals:   10/22/23 1000  BP: 113/72  Pulse: 77  SpO2: 100%  Weight: 215 lb (97.5 kg)    Wt Readings from Last 3 Encounters:  10/22/23 215 lb (97.5  kg)  09/11/23 218 lb (98.9 kg)  09/06/23 218 lb (98.9 kg)   Lab Results  Component Value Date   CREATININE 0.68 09/11/2023   CREATININE 0.90 08/30/2023   CREATININE 1.05 (H) 08/29/2023   PHYSICAL EXAM: Vitals:   10/22/23 1000  BP: 113/72  Pulse: 77  SpO2: 100%   General:  Well appearing. No resp difficulty HEENT: normal Neck: supple. no JVD. Carotids 2+ bilat; no bruits. No lymphadenopathy or thryomegaly appreciated. Cor: PMI nondisplaced. Regular rate & rhythm. No rubs, gallops or murmurs. Lungs: clear Abdomen: obese soft, nontender, nondistended. No hepatosplenomegaly. No bruits or masses. Good bowel sounds. Extremities: no cyanosis, clubbing, rash, edema Neuro: alert & orientedx3, cranial nerves grossly intact. moves all 4 extremities w/o difficulty. Affect pleasant  ASSESSMENT & PLAN:  1. NICM with reduced ejection fraction- - cath 2003 and 2022 no CAD - Echo 02/18/14: EF 40-45% with Grade I DD - Echo 02/22/17: EF 45-50% with Grade I DD - Echo 03/24/19: EF 45-50% with mild LVH, Grade I DD, borderline aortic root dilatation 37 mm  -Echo 03/21/20: EF 40-45% with Grade I DD, borderline aortic root dilatation 37 mm - Echo 10/03/22: EF 30-35% with moderate LVH, Grade I DD, mild LAE - 12/13/22: Echo LVEF 25-30%, normal RV function.  - 12/24 Myoview   EF 40% - Echo 7/25 EF 30-35% RV ok RVSP normal Personally reviewed - CPX 8/25: pVO2: 8.9 (51%) VE/VCO2 slope:  51 OUES: 0.6 (30% of predicted) Peak RER: 1.22 (see  note below about RER) Ventilatory Threshold: 6.4 37% predicted or measured peak VO2  - NYHA IIIB  - continue entresto  24/26mg  BID - continues to take lasix  20mg  every other day; euvolemic on exam today.  - continue spironolactone  25mg  daily - continue metoprolol  succinate 50 mg daily - continue digoxin  0.125 daily - will continue to hold SGLT2i; - CPX testing and symptoms seem out of proportion  to echo findings. CPX suggests need for advanced therapies Will need RHC to  further evaluate. Will also get VQ to assess for CTEPH  2. RV Failure- - TTE from 10/03/22 with at least moderately reduced RV function 2/2 acute PE; suspect this has improved after thrombectomy.  - TTE 12/13/22 w/ LVEF 25%-30%, with low normal RV function now. Euvolemic on exam with no clinical signs of RV failure - Echo 7/25 EF 30-35% RV ok RVSP normal - Check VQ as above   3. H/o VT- - s/p MDT ICD - Device interrogation as above. No further VT  4. Recurrent DVT/PE- - s/p thrombectomy - 12/13/22: Echo LVEF 25-30%, normal RV function.  - continue lifelong Eliquis  -> after 1 year can decrease to 2.5 bid per Amplify-EXT data - No evidence of RV strain on echo  - Check VQ and RHC  5. Morbid obesity- - Body mass index is Body mass index is 33.67 kg/m. - On Mounjao  6. OSA - compliant with OSA  I spent a total of 50 minutes today: 1) reviewing the patient's medical records including previous charts, labs and recent notes from other providers; 2) examining the patient and counseling them on their medical issues/explaining the plan of care; 3) adjusting meds as needed and 4) ordering lab work or other needed tests.    Toribio Fuel, MD  10:34 AM

## 2023-10-23 ENCOUNTER — Encounter

## 2023-10-23 ENCOUNTER — Other Ambulatory Visit

## 2023-10-23 LAB — COMPREHENSIVE METABOLIC PANEL WITH GFR
ALT: 18 IU/L (ref 0–32)
AST: 22 IU/L (ref 0–40)
Albumin: 4.3 g/dL (ref 3.9–4.9)
Alkaline Phosphatase: 73 IU/L (ref 49–135)
BUN/Creatinine Ratio: 17 (ref 12–28)
BUN: 14 mg/dL (ref 8–27)
Bilirubin Total: 0.3 mg/dL (ref 0.0–1.2)
CO2: 20 mmol/L (ref 20–29)
Calcium: 10.2 mg/dL (ref 8.7–10.3)
Chloride: 104 mmol/L (ref 96–106)
Creatinine, Ser: 0.83 mg/dL (ref 0.57–1.00)
Globulin, Total: 2.2 g/dL (ref 1.5–4.5)
Glucose: 88 mg/dL (ref 70–99)
Potassium: 4.1 mmol/L (ref 3.5–5.2)
Sodium: 142 mmol/L (ref 134–144)
Total Protein: 6.5 g/dL (ref 6.0–8.5)
eGFR: 80 mL/min/1.73 (ref >59)

## 2023-10-23 LAB — CBC
Hematocrit: 40.6 % (ref 34.0–46.6)
Hemoglobin: 13.1 g/dL (ref 11.1–15.9)
MCH: 32 pg (ref 26.6–33.0)
MCHC: 32.3 g/dL (ref 31.5–35.7)
MCV: 99 fL — ABNORMAL HIGH (ref 79–97)
Platelets: 179 x10E3/uL (ref 150–450)
RBC: 4.1 x10E6/uL (ref 3.77–5.28)
RDW: 13.2 % (ref 11.7–15.4)
WBC: 5.9 x10E3/uL (ref 3.4–10.8)

## 2023-10-23 LAB — BRAIN NATRIURETIC PEPTIDE: BNP: 34.8 pg/mL (ref 0.0–100.0)

## 2023-10-23 LAB — DIGOXIN LEVEL: Digoxin, Serum: 0.6 ng/mL (ref 0.5–0.9)

## 2023-10-23 MED ORDER — FUROSEMIDE 40 MG PO TABS: 40.0000 mg | ORAL_TABLET | Freq: Every day | ORAL | 5 refills | Status: DC

## 2023-10-23 NOTE — Progress Notes (Signed)
Remote ICD Transmission.

## 2023-10-23 NOTE — Progress Notes (Signed)
 Called and notified pt. Pt had good understanding. Asked pt if she needed more medication. Pt states she just got a new script but would like the new dose to be sent for future pick-up.  New rx sent to pharmacy.

## 2023-10-24 ENCOUNTER — Encounter

## 2023-10-24 ENCOUNTER — Encounter
Admission: RE | Admit: 2023-10-24 | Discharge: 2023-10-24 | Disposition: A | Discharge status: Home / Self Care | Source: Ambulatory Visit | Attending: Internal Medicine | Admitting: Internal Medicine

## 2023-10-24 ENCOUNTER — Ambulatory Visit
Admission: RE | Admit: 2023-10-24 | Discharge: 2023-10-24 | Disposition: A | Discharge status: Home / Self Care | Source: Ambulatory Visit | Attending: Internal Medicine | Admitting: Internal Medicine

## 2023-10-24 DIAGNOSIS — I2782 Chronic pulmonary embolism: Secondary | ICD-10-CM | POA: Diagnosis not present

## 2023-10-24 DIAGNOSIS — I110 Hypertensive heart disease with heart failure: Secondary | ICD-10-CM | POA: Diagnosis not present

## 2023-10-24 DIAGNOSIS — Z950 Presence of cardiac pacemaker: Secondary | ICD-10-CM | POA: Diagnosis not present

## 2023-10-24 DIAGNOSIS — I2699 Other pulmonary embolism without acute cor pulmonale: Secondary | ICD-10-CM | POA: Insufficient documentation

## 2023-10-24 DIAGNOSIS — I509 Heart failure, unspecified: Secondary | ICD-10-CM | POA: Diagnosis not present

## 2023-10-24 MED ORDER — TECHNETIUM TO 99M ALBUMIN AGGREGATED
4.0000 | Freq: Once | INTRAVENOUS | Status: AC | PRN
Start: 2023-10-24 — End: 2023-10-24
  Administered 2023-10-24: 4.23 via INTRAVENOUS

## 2023-10-25 ENCOUNTER — Encounter: Admitting: Internal Medicine

## 2023-10-28 ENCOUNTER — Encounter

## 2023-10-28 ENCOUNTER — Ambulatory Visit (HOSPITAL_COMMUNITY): Payer: Self-pay | Admitting: Internal Medicine

## 2023-10-28 DIAGNOSIS — M25511 Pain in right shoulder: Secondary | ICD-10-CM | POA: Diagnosis not present

## 2023-10-28 DIAGNOSIS — M25611 Stiffness of right shoulder, not elsewhere classified: Secondary | ICD-10-CM | POA: Diagnosis not present

## 2023-10-29 ENCOUNTER — Telehealth (HOSPITAL_COMMUNITY): Payer: Self-pay | Admitting: *Deleted

## 2023-10-29 ENCOUNTER — Other Ambulatory Visit: Payer: Self-pay

## 2023-10-29 DIAGNOSIS — I5042 Chronic combined systolic (congestive) and diastolic (congestive) heart failure: Secondary | ICD-10-CM

## 2023-10-29 NOTE — Progress Notes (Signed)
Orders placed for right heart cath

## 2023-10-30 ENCOUNTER — Other Ambulatory Visit: Payer: Self-pay

## 2023-10-30 ENCOUNTER — Encounter

## 2023-10-30 ENCOUNTER — Encounter (HOSPITAL_COMMUNITY)
Admission: RE | Disposition: A | Discharge status: Home / Self Care | Payer: Self-pay | Source: Home / Self Care | Attending: Internal Medicine

## 2023-10-30 ENCOUNTER — Ambulatory Visit (HOSPITAL_COMMUNITY)
Admission: RE | Admit: 2023-10-30 | Discharge: 2023-10-30 | Disposition: A | Discharge status: Home / Self Care | Attending: Internal Medicine | Admitting: Internal Medicine

## 2023-10-30 DIAGNOSIS — G4733 Obstructive sleep apnea (adult) (pediatric): Secondary | ICD-10-CM | POA: Insufficient documentation

## 2023-10-30 DIAGNOSIS — I5022 Chronic systolic (congestive) heart failure: Secondary | ICD-10-CM | POA: Diagnosis not present

## 2023-10-30 DIAGNOSIS — I5043 Acute on chronic combined systolic (congestive) and diastolic (congestive) heart failure: Secondary | ICD-10-CM | POA: Diagnosis not present

## 2023-10-30 DIAGNOSIS — Z86718 Personal history of other venous thrombosis and embolism: Secondary | ICD-10-CM | POA: Diagnosis not present

## 2023-10-30 DIAGNOSIS — I110 Hypertensive heart disease with heart failure: Secondary | ICD-10-CM | POA: Insufficient documentation

## 2023-10-30 DIAGNOSIS — Z79899 Other long term (current) drug therapy: Secondary | ICD-10-CM | POA: Diagnosis not present

## 2023-10-30 DIAGNOSIS — I5042 Chronic combined systolic (congestive) and diastolic (congestive) heart failure: Secondary | ICD-10-CM

## 2023-10-30 DIAGNOSIS — Z6833 Body mass index (BMI) 33.0-33.9, adult: Secondary | ICD-10-CM | POA: Diagnosis not present

## 2023-10-30 DIAGNOSIS — I428 Other cardiomyopathies: Secondary | ICD-10-CM | POA: Diagnosis not present

## 2023-10-30 DIAGNOSIS — Z7901 Long term (current) use of anticoagulants: Secondary | ICD-10-CM | POA: Insufficient documentation

## 2023-10-30 DIAGNOSIS — Z95810 Presence of automatic (implantable) cardiac defibrillator: Secondary | ICD-10-CM | POA: Insufficient documentation

## 2023-10-30 HISTORY — PX: RIGHT HEART CATH: CATH118263

## 2023-10-30 SURGERY — RIGHT HEART CATH
Anesthesia: LOCAL

## 2023-10-30 MED ORDER — ASPIRIN 81 MG PO CHEW: 81.0000 mg | CHEWABLE_TABLET | ORAL | Status: DC

## 2023-10-30 MED ORDER — FREE WATER: 500.0000 mL | Freq: Once | Status: DC

## 2023-10-30 MED ORDER — SODIUM CHLORIDE 0.9% FLUSH: 3.0000 mL | INTRAVENOUS | Status: DC | PRN

## 2023-10-30 MED ORDER — LIDOCAINE HCL (PF) 1 % IJ SOLN
INTRAMUSCULAR | Status: AC
  Filled 2023-10-30: qty 30

## 2023-10-30 MED ORDER — HYDRALAZINE HCL 20 MG/ML IJ SOLN: 10.0000 mg | INTRAMUSCULAR | Status: DC | PRN

## 2023-10-30 MED ORDER — SODIUM CHLORIDE 0.9 % IV SOLN: 250.0000 mL | INTRAVENOUS | Status: DC | PRN

## 2023-10-30 MED ORDER — SODIUM CHLORIDE 0.9% FLUSH: 3.0000 mL | Freq: Two times a day (BID) | INTRAVENOUS | Status: DC

## 2023-10-30 MED ORDER — HEPARIN (PORCINE) IN NACL 1000-0.9 UT/500ML-% IV SOLN
INTRAVENOUS | Status: DC | PRN
  Administered 2023-10-30: 500 mL

## 2023-10-30 MED ORDER — ONDANSETRON HCL 4 MG/2ML IJ SOLN: 4.0000 mg | Freq: Four times a day (QID) | INTRAMUSCULAR | Status: DC | PRN

## 2023-10-30 MED ORDER — LIDOCAINE HCL (PF) 1 % IJ SOLN
INTRAMUSCULAR | Status: DC | PRN
  Administered 2023-10-30: 3 mL

## 2023-10-30 MED ORDER — ACETAMINOPHEN 325 MG PO TABS: 650.0000 mg | ORAL_TABLET | ORAL | Status: DC | PRN

## 2023-10-30 MED ORDER — LABETALOL HCL 5 MG/ML IV SOLN: 10.0000 mg | INTRAVENOUS | Status: DC | PRN

## 2023-10-30 SURGICAL SUPPLY — 8 items
CATH SWAN GANZ 7F STRAIGHT (CATHETERS) IMPLANT
GLIDESHEATH SLENDER 7FR .021G (SHEATH) IMPLANT
GUIDEWIRE .025 260CM (WIRE) IMPLANT
KIT MICROPUNCTURE NIT STIFF (SHEATH) IMPLANT
PACK CARDIAC CATHETERIZATION (CUSTOM PROCEDURE TRAY) ×1 IMPLANT
SHEATH PROBE COVER 6X72 (BAG) IMPLANT
TRANSDUCER W/STOPCOCK (MISCELLANEOUS) IMPLANT
TUBING ART PRESS 72 MALE/FEM (TUBING) IMPLANT

## 2023-10-30 NOTE — Interval H&P Note (Signed)
 History and Physical Interval Note:  10/30/2023 1:36 PM  Lisa Fuentes  has presented today for surgery, with the diagnosis of CHF.  The various methods of treatment have been discussed with the patient and family. After consideration of risks, benefits and other options for treatment, the patient has consented to  Procedure(s): RIGHT HEART CATH (N/A) as a surgical intervention.  The patient's history has been reviewed, patient examined, no change in status, stable for surgery.  I have reviewed the patient's chart and labs.  Questions were answered to the patient's satisfaction.     Milina Pagett

## 2023-10-30 NOTE — Discharge Instructions (Signed)

## 2023-10-31 ENCOUNTER — Encounter (HOSPITAL_COMMUNITY): Payer: Self-pay | Admitting: Internal Medicine

## 2023-10-31 ENCOUNTER — Encounter

## 2023-10-31 ENCOUNTER — Telehealth (INDEPENDENT_AMBULATORY_CARE_PROVIDER_SITE_OTHER): Payer: Self-pay | Admitting: Nurse Practitioner

## 2023-10-31 DIAGNOSIS — Z4889 Encounter for other specified surgical aftercare: Secondary | ICD-10-CM | POA: Diagnosis not present

## 2023-10-31 DIAGNOSIS — M25511 Pain in right shoulder: Secondary | ICD-10-CM | POA: Diagnosis not present

## 2023-10-31 LAB — POCT I-STAT EG7
Acid-Base Excess: 0 mmol/L (ref 0.0–2.0)
Acid-base deficit: 1 mmol/L (ref 0.0–2.0)
Bicarbonate: 23.7 mmol/L (ref 20.0–28.0)
Bicarbonate: 24.4 mmol/L (ref 20.0–28.0)
Calcium, Ion: 1.18 mmol/L (ref 1.15–1.40)
Calcium, Ion: 1.3 mmol/L (ref 1.15–1.40)
HCT: 36 % (ref 36.0–46.0)
HCT: 38 % (ref 36.0–46.0)
Hemoglobin: 12.2 g/dL (ref 12.0–15.0)
Hemoglobin: 12.9 g/dL (ref 12.0–15.0)
O2 Saturation: 65 %
O2 Saturation: 76 %
Potassium: 3.6 mmol/L (ref 3.5–5.1)
Potassium: 4 mmol/L (ref 3.5–5.1)
Sodium: 139 mmol/L (ref 135–145)
Sodium: 141 mmol/L (ref 135–145)
TCO2: 25 mmol/L (ref 22–32)
TCO2: 26 mmol/L (ref 22–32)
pCO2, Ven: 38.3 mmHg — ABNORMAL LOW (ref 44–60)
pCO2, Ven: 39.1 mmHg — ABNORMAL LOW (ref 44–60)
pH, Ven: 7.39 (ref 7.25–7.43)
pH, Ven: 7.413 (ref 7.25–7.43)
pO2, Ven: 34 mmHg (ref 32–45)
pO2, Ven: 40 mmHg (ref 32–45)

## 2023-10-31 NOTE — Telephone Encounter (Signed)
 Can we get more information regarding the pain in the legs she is having

## 2023-10-31 NOTE — Telephone Encounter (Signed)
 Patient stated that the pain has been going on for the past 2 months. Patient is having bilateral leg pain at level 10 and weakness. She is not able to stand long or walk far. The patient is still having left ankle numbness since the laser procedure.

## 2023-10-31 NOTE — Telephone Encounter (Signed)
 Patient left a message yesterday requesting to schedule an appointment for the pain in her legs.  However, patient have an appointment for [facility] in [facility] on 01/16/2024, she stated that was too long to wait.  Patient was last seen on 05/31/2023 then cancel/no show last 2 following appointments.  Please advise.

## 2023-10-31 NOTE — Telephone Encounter (Signed)
 Patient has been notified with medical recommendations and verbalized that she would like to move forward with scheduling appointment. Patient was informed that the front receptionist will contact to scheduled appt.

## 2023-10-31 NOTE — Telephone Encounter (Signed)
 The pain in your legs would not be caused by your veins.  We can evaluate an ABI but I suspect it is your back.  As far as the ankle numbness, that is just a matter of time to allow for resolution.  She can see JD/FB/BP

## 2023-11-04 ENCOUNTER — Telehealth: Payer: Self-pay | Admitting: Internal Medicine

## 2023-11-05 DIAGNOSIS — E0590 Thyrotoxicosis, unspecified without thyrotoxic crisis or storm: Secondary | ICD-10-CM | POA: Diagnosis not present

## 2023-11-07 DIAGNOSIS — M533 Sacrococcygeal disorders, not elsewhere classified: Secondary | ICD-10-CM | POA: Diagnosis not present

## 2023-11-07 DIAGNOSIS — Z79899 Other long term (current) drug therapy: Secondary | ICD-10-CM | POA: Diagnosis not present

## 2023-11-07 DIAGNOSIS — G894 Chronic pain syndrome: Secondary | ICD-10-CM | POA: Diagnosis not present

## 2023-11-07 DIAGNOSIS — M47817 Spondylosis without myelopathy or radiculopathy, lumbosacral region: Secondary | ICD-10-CM | POA: Diagnosis not present

## 2023-11-11 ENCOUNTER — Other Ambulatory Visit: Payer: Self-pay | Admitting: Family Medicine

## 2023-11-11 DIAGNOSIS — Z1231 Encounter for screening mammogram for malignant neoplasm of breast: Secondary | ICD-10-CM

## 2023-11-12 NOTE — Telephone Encounter (Signed)
 Thyroid  levels ordered by kc. Needs to follow up with them for results.

## 2023-11-14 DIAGNOSIS — F339 Major depressive disorder, recurrent, unspecified: Secondary | ICD-10-CM | POA: Diagnosis not present

## 2023-11-14 DIAGNOSIS — Z8679 Personal history of other diseases of the circulatory system: Secondary | ICD-10-CM | POA: Diagnosis not present

## 2023-11-14 DIAGNOSIS — I10 Essential (primary) hypertension: Secondary | ICD-10-CM | POA: Diagnosis not present

## 2023-11-14 DIAGNOSIS — E782 Mixed hyperlipidemia: Secondary | ICD-10-CM | POA: Diagnosis not present

## 2023-11-14 DIAGNOSIS — E039 Hypothyroidism, unspecified: Secondary | ICD-10-CM | POA: Diagnosis not present

## 2023-11-14 DIAGNOSIS — Z23 Encounter for immunization: Secondary | ICD-10-CM | POA: Diagnosis not present

## 2023-11-14 DIAGNOSIS — R7303 Prediabetes: Secondary | ICD-10-CM | POA: Diagnosis not present

## 2023-11-14 DIAGNOSIS — F419 Anxiety disorder, unspecified: Secondary | ICD-10-CM | POA: Diagnosis not present

## 2023-11-15 ENCOUNTER — Inpatient Hospital Stay: Payer: Medicare HMO | Attending: Family Medicine

## 2023-11-15 ENCOUNTER — Encounter: Payer: Self-pay | Admitting: Oncology

## 2023-11-15 ENCOUNTER — Inpatient Hospital Stay: Payer: Medicare HMO | Admitting: Oncology

## 2023-11-27 DIAGNOSIS — M47817 Spondylosis without myelopathy or radiculopathy, lumbosacral region: Secondary | ICD-10-CM | POA: Diagnosis not present

## 2023-11-28 ENCOUNTER — Other Ambulatory Visit: Payer: Self-pay | Admitting: Vascular Surgery

## 2023-11-28 ENCOUNTER — Other Ambulatory Visit (INDEPENDENT_AMBULATORY_CARE_PROVIDER_SITE_OTHER): Payer: Self-pay | Admitting: Nurse Practitioner

## 2023-11-28 DIAGNOSIS — I83893 Varicose veins of bilateral lower extremities with other complications: Secondary | ICD-10-CM

## 2023-11-28 DIAGNOSIS — I8312 Varicose veins of left lower extremity with inflammation: Secondary | ICD-10-CM

## 2023-11-28 DIAGNOSIS — M7989 Other specified soft tissue disorders: Secondary | ICD-10-CM

## 2023-11-28 DIAGNOSIS — M79604 Pain in right leg: Secondary | ICD-10-CM

## 2023-11-29 ENCOUNTER — Telehealth (HOSPITAL_COMMUNITY): Payer: Self-pay | Admitting: Pharmacist

## 2023-11-29 ENCOUNTER — Telehealth: Payer: Self-pay | Admitting: Cardiology

## 2023-11-29 ENCOUNTER — Ambulatory Visit (INDEPENDENT_AMBULATORY_CARE_PROVIDER_SITE_OTHER): Admitting: Vascular Surgery

## 2023-11-29 ENCOUNTER — Encounter (INDEPENDENT_AMBULATORY_CARE_PROVIDER_SITE_OTHER)

## 2023-11-29 MED ORDER — MOUNJARO 5 MG/0.5ML ~~LOC~~ SOAJ: 5.0000 mg | SUBCUTANEOUS | 1 refills | Status: DC

## 2023-11-29 NOTE — Telephone Encounter (Signed)
 Called patient to advise of 1.9 years remaining battery life - patient verbalized understanding

## 2023-11-29 NOTE — Telephone Encounter (Signed)
 Patient stated she last had her battery changed on her defibrillator in 2015.  Patient wants a call back regarding when next she should have her battery changed.

## 2023-11-29 NOTE — Telephone Encounter (Signed)
 Patient tolerating Mounjaro  5 mg weekly well after issues with 7.5 mg dose. Will continue this month and consider increasing back to 7.5 mg next month.

## 2023-12-07 ENCOUNTER — Ambulatory Visit: Payer: Self-pay | Admitting: Cardiology

## 2023-12-12 ENCOUNTER — Encounter: Payer: Self-pay | Admitting: Neurosurgery

## 2023-12-13 ENCOUNTER — Ambulatory Visit
Admission: RE | Admit: 2023-12-13 | Discharge: 2023-12-13 | Disposition: A | Discharge status: Home / Self Care | Source: Ambulatory Visit | Attending: Family Medicine | Admitting: Family Medicine

## 2023-12-13 DIAGNOSIS — Z1231 Encounter for screening mammogram for malignant neoplasm of breast: Secondary | ICD-10-CM | POA: Insufficient documentation

## 2023-12-13 NOTE — Progress Notes (Deleted)
 Referring Physician:  Valora Lynwood FALCON, MD [address] [place],  [place] [address]  Primary Physician:  Valora Lynwood FALCON, MD  History of Present Illness: 12/13/2023 Lisa Fuentes is here today with a chief complaint of *** Low back pain Any numbness, tingling or weakness?    Duration: *** Location: *** Quality: *** Severity: ***  Precipitating: aggravated by *** Modifying factors: made better by *** Weakness: none Timing: *** Bowel/Bladder Dysfunction: none  Conservative measures:  Physical therapy: *** has not participated in Multimodal medical therapy including regular antiinflammatories: ***Oxycodone , Gabapentin   Injections: 11/07/2023 L3-L5 Bilateral ESI  Past Surgery: ***none?   Lisa Fuentes has ***no symptoms of cervical myelopathy.  The symptoms are causing a significant impact on the patient's life.   I have utilized the care everywhere function in epic to review the outside records available from external health systems.  Review of Systems:  A 10 point review of systems is negative, except for the pertinent positives and negatives detailed in the HPI.  Past Medical History: Past Medical History:  Diagnosis Date   AICD (automatic cardioverter/defibrillator) present 2008   a.) Medtronic device placed 2008. b.) replaced 06/21/2013 (Medtronic Evera XT single-chamber ICD; model number [id number]).   Anemia    Anxiety    Aortic dilatation 03/24/2019   a.) TTE 03/24/2019: Ao root measured 37 mm. b.) TTE 03/21/2020: Ao root measured 37 mm.   Arthritis    Depression    Difficult intubation    DOE (dyspnea on exertion)    Endometriosis of vagina 09/2013   Intra-operative findings of endometriosis implants on cervical stump   Essential hypertension    GERD (gastroesophageal reflux disease)    Headache    HFrEF (heart failure with reduced ejection fraction) (HCC)    a.) TTE 02/18/2014: EF 40-45%; tiv AR, mild MR/TR/PR; G1DD. b.)  TTE 02/22/2017: EF 45-50%; mild LVH; triv PR; G1DD. c.) TTE 03/24/2019: EF 45-50%; glob HK, mild LVH; triv MR/TR/PR. d.) TTE 03/21/2020: EF 40-45%; glob HK.   History of 2019 novel coronavirus disease (COVID-19) 02/02/2020   History of 2019 novel coronavirus disease (COVID-19) 02/02/2020   Hypokalemia    Hypothyroidism    a.) s/p radioactive iodine  Tx; on levothyroxine    Mixed hyperlipidemia    Nonischemic cardiomyopathy (HCC)    a.) TTE 02/18/2014: EF 40-45%. b.) TTE 02/22/2017: EF 45-50%. c.) TTE 03/24/2019: EF 45-50%. d.) TTE 03/21/2020: EF 40-45%   OSA on CPAP    Panic attacks    PSVT (paroxysmal supraventricular tachycardia) 03/30/2020   a.) Holter 03/30/2020 --> 2 runs; fastest lasting 4 beats (184 bpm); longest lasting 4 beats (113 bpm)   Pulmonary embolism (HCC)    Tachycardia     Past Surgical History: Past Surgical History:  Procedure Laterality Date   ANTERIOR AND POSTERIOR REPAIR N/A 11/25/2017   Procedure: ANTERIOR (CYSTOCELE) AND POSTERIOR REPAIR (RECTOCELE);  Surgeon: Connell Davies, MD;  Location: [facility];  Service: Gynecology;  Laterality: N/A;   BREAST CYST ASPIRATION Left 03/22/2014   neg/ done by Dr Dellie   CARDIAC CATHETERIZATION  2003   [facility]: No significant coronary artery disease with reduced ejection fraction.   CARDIAC DEFIBRILLATOR PLACEMENT  04/2006   replaced 05/2013   CARDIAC DEFIBRILLATOR PLACEMENT  06/21/2013   Procedure:  CARDIAC DEFIBRILLATOR PLACEMENT (Medtronic single-chamber ICD, model number Evera XT, model number [id number]): Location: [facility]; Surgeon: Franky Ned, MD   CARPAL TUNNEL RELEASE Right    COLONOSCOPY  2015  COLONOSCOPY N/A 10/13/2020   Procedure: COLONOSCOPY;  Surgeon: Onita Elspeth Sharper, DO;  Location: [facility];  Service: Gastroenterology;  Laterality: N/A;   COLONOSCOPY WITH PROPOFOL  N/A 10/04/2015   Procedure: COLONOSCOPY WITH PROPOFOL ;  Surgeon: Rogelia Copping, MD;  Location: [facility];  Service: Endoscopy;   Laterality: N/A;   CORONARY ANGIOPLASTY     CYSTOSCOPY  11/15/2014   Procedure: CYSTOSCOPY;  Surgeon: Archie Savers, MD;  Location: [facility];  Service: Gynecology;;   DIAGNOSTIC LAPAROSCOPY     ETHMOIDECTOMY Right 07/19/2021   Procedure: TOTAL ETHMOIDECTOMY WITH FRONTAL SINUS EXPLORATION;  Surgeon: Edda Mt, MD;  Location: [facility];  Service: ENT;  Laterality: Right;   FOOT SURGERY Bilateral    heer spur and bunion   IMAGE GUIDED SINUS SURGERY N/A 07/19/2021   Procedure: IMAGE GUIDED SINUS SURGERY;  Surgeon: Edda Mt, MD;  Location: [facility];  Service: ENT;  Laterality: N/A;   KNEE ARTHROSCOPY Left 03/02/2015   Procedure: ARTHROSCOPY  LEFT KNEE, PARTIAL LATERAL  MENISECTOMY, SYNOVECTOMY, MEDIAL & LATERAL CHONDROPLASTY;  Surgeon: Franky Cranker, MD;  Location: [facility];  Service: Orthopedics;  Laterality: Left;   LAPAROSCOPIC SALPINGO OOPHERECTOMY Left 11/15/2014   Procedure: LAPAROSCOPIC OOPHORECTOMY;  Surgeon: Archie Savers, MD;  Location: [facility];  Service: Gynecology;  Laterality: Left;   LAPAROSCOPIC SALPINGOOPHERECTOMY Right 09/2013   also with left salpingectomy   LEFT HEART CATH AND CORONARY ANGIOGRAPHY N/A 03/28/2020   Procedure: LEFT HEART CATH AND CORONARY ANGIOGRAPHY;  Surgeon: Darron Deatrice LABOR, MD;  Location: [facility];  Service: Cardiovascular;  Laterality: N/A;   MAXILLARY ANTROSTOMY Right 07/19/2021   Procedure: MAXILLARY ANTROSTOMY WITH REMOVAL TISSUE;  Surgeon: Edda Mt, MD;  Location: [facility];  Service: ENT;  Laterality: Right;   MINOR HEMORRHOIDECTOMY     NASAL TURBINATE REDUCTION Bilateral 07/19/2021   Procedure: INFERIOR TURBINATE REDUCTION;  Surgeon: Edda Mt, MD;  Location: [facility];  Service: ENT;  Laterality: Bilateral;   PULMONARY THROMBECTOMY Bilateral 10/03/2022   Procedure: PULMONARY THROMBECTOMY;  Surgeon: Marea Selinda RAMAN, MD;  Location: [facility];  Service: Cardiovascular;  Laterality: Bilateral;   RIGHT HEART CATH N/A  10/30/2023   Procedure: RIGHT HEART CATH;  Surgeon: Cherrie Toribio SAUNDERS, MD;  Location: [facility];  Service: Cardiovascular;  Laterality: N/A;   SEPTOPLASTY N/A 07/19/2021   Procedure: SEPTOPLASTY;  Surgeon: Edda Mt, MD;  Location: [facility];  Service: ENT;  Laterality: N/A;   TONSILLECTOMY     TOTAL ABDOMINAL HYSTERECTOMY     TRACHELECTOMY N/A 11/15/2014   Procedure: TRACHELECTOMY;  Surgeon: Archie Savers, MD;  Location: [facility];  Service: Gynecology;  Laterality: N/A;   TRIGGER FINGER RELEASE Right    TUBAL LIGATION Bilateral     Allergies: Allergies as of 12/18/2023 - Review Complete 10/30/2023  Allergen Reaction Noted   Sulfa antibiotics Hives 04/30/2013   Sulfamethoxazole-trimethoprim Hives 02/02/2014   Jardiance  [empagliflozin ] Rash 10/30/2022    Medications:  Current Outpatient Medications:    Acetaminophen  (TYLENOL  ARTHRITIS PAIN PO), Take 1,300 mg by mouth every 6 (six) hours as needed (Pain)., Disp: , Rfl:    apixaban  (ELIQUIS ) 5 MG TABS tablet, Take 5 mg by mouth 2 (two) times daily., Disp: , Rfl:    digoxin  (LANOXIN ) 0.125 MG tablet, Take 0.5 tablets (0.0625 mg total) by mouth daily. Reduced from 0.125 mg. (Patient taking differently: Take 0.125 mg by mouth daily. Reduced from 0.125 mg.), Disp: , Rfl:    esomeprazole (NEXIUM) 40 MG capsule, Take 40 mg by  mouth every morning. , Disp: , Rfl:    fluticasone (FLONASE) 50 MCG/ACT nasal spray, Place 2 sprays into both nostrils daily., Disp: , Rfl:    furosemide  (LASIX ) 40 MG tablet, Take 1 tablet (40 mg total) by mouth daily., Disp: 30 tablet, Rfl: 5   gabapentin  (NEURONTIN ) 100 MG capsule, Take 100 mg by mouth 3 (three) times daily., Disp: , Rfl:    levothyroxine  (SYNTHROID ) 125 MCG tablet, Take 125 mcg by mouth every morning. (Patient taking differently: Take 150 mcg by mouth every morning.), Disp: , Rfl:    metoprolol  succinate (TOPROL -XL) 25 MG 24 hr tablet, Take 0.5 tablets (12.5 mg total) by mouth daily. Reduced  from 50 mg daily. (Patient taking differently: Take 25 mg by mouth daily. Reduced from 50 mg daily.), Disp: 15 tablet, Rfl: 2   oxyCODONE -acetaminophen  (PERCOCET/ROXICET) 5-325 MG tablet, Take 1 tablet by mouth 3 (three) times daily as needed for severe pain (pain score 7-10). Home med., Disp: , Rfl:    rosuvastatin  (CRESTOR ) 20 MG tablet, Take 1 tablet (20 mg total) by mouth daily., Disp: 90 tablet, Rfl: 3   sacubitril -valsartan  (ENTRESTO ) 24-26 MG, Take 1 tablet by mouth 2 (two) times daily. (Patient taking differently: Take 1 tablet by mouth 2 (two) times daily.), Disp: , Rfl:    sertraline  (ZOLOFT ) 100 MG tablet, Take 200 mg by mouth daily., Disp: , Rfl:    spironolactone  (ALDACTONE ) 25 MG tablet, Take 1 tablet (25 mg total) by mouth daily., Disp: 90 tablet, Rfl: 3   tirzepatide  (MOUNJARO ) 5 MG/0.5ML Pen, Inject 5 mg into the skin once a week., Disp: 2 mL, Rfl: 1  Social History: Social History   Tobacco Use   Smoking status: Never   Smokeless tobacco: Never  Vaping Use   Vaping status: Never Used  Substance Use Topics   Alcohol use: Yes    Alcohol/week: 0.0 standard drinks of alcohol    Comment: occassionally   Drug use: No    Family Medical History: Family History  Problem Relation Age of Onset   Hypertension Mother    Hyperlipidemia Mother    Stroke Mother    Colon cancer Mother    Breast cancer Sister 10   Ovarian cancer Sister    Throat cancer Brother     Physical Examination: There were no vitals filed for this visit.  General: Patient is in no apparent distress. Attention to examination is appropriate.  Neck:   Supple.  Full range of motion.  Respiratory: Patient is breathing without any difficulty.   NEUROLOGICAL:     Awake, alert, oriented to person, place, and time.  Speech is clear and fluent.   Cranial Nerves: Pupils equal round and reactive to light.  Facial tone is symmetric.  Facial sensation is symmetric. Shoulder shrug is symmetric. Tongue  protrusion is midline.    Strength: Side Biceps Triceps Deltoid Interossei Grip Wrist Ext. Wrist Flex.  R 5 5 5 5 5 5 5   L 5 5 5 5 5 5 5    Side Iliopsoas Quads Hamstring PF DF EHL  R 5 5 5 5 5 5   L 5 5 5 5 5 5    Reflexes are ***2+ and symmetric at the biceps, triceps, brachioradialis, patella and achilles.   Hoffman's is absent. Clonus is absent  Bilateral upper and lower extremity sensation is intact to light touch ***.     No evidence of dysmetria noted.  Gait is normal.    Imaging: *** I have personally reviewed  the images and agree with the above interpretation.  Medical Decision Making/Assessment and Plan: Lisa Fuentes is a pleasant 63 y.o. female with ***  There are no diagnoses linked to this encounter.   Thank you for involving me in the care of this patient.    Penne MICAEL Sharps MD/MSCR Neurosurgery

## 2023-12-16 ENCOUNTER — Ambulatory Visit

## 2023-12-16 ENCOUNTER — Other Ambulatory Visit: Payer: Self-pay

## 2023-12-16 ENCOUNTER — Encounter: Payer: Self-pay | Admitting: Neurology

## 2023-12-16 ENCOUNTER — Ambulatory Visit: Admitting: Neurosurgery

## 2023-12-16 ENCOUNTER — Encounter: Payer: Self-pay | Admitting: Neurosurgery

## 2023-12-16 ENCOUNTER — Encounter: Admitting: Internal Medicine

## 2023-12-16 VITALS — BP 134/82 | Ht 67.0 in | Wt 218.4 lb

## 2023-12-16 DIAGNOSIS — M545 Low back pain, unspecified: Secondary | ICD-10-CM | POA: Diagnosis not present

## 2023-12-16 DIAGNOSIS — M4316 Spondylolisthesis, lumbar region: Secondary | ICD-10-CM | POA: Diagnosis not present

## 2023-12-16 DIAGNOSIS — M5416 Radiculopathy, lumbar region: Secondary | ICD-10-CM

## 2023-12-16 DIAGNOSIS — R202 Paresthesia of skin: Secondary | ICD-10-CM

## 2023-12-16 DIAGNOSIS — G894 Chronic pain syndrome: Secondary | ICD-10-CM

## 2023-12-16 DIAGNOSIS — G8929 Other chronic pain: Secondary | ICD-10-CM

## 2023-12-16 DIAGNOSIS — G629 Polyneuropathy, unspecified: Secondary | ICD-10-CM

## 2023-12-16 NOTE — Progress Notes (Signed)
 Referring Physician:  Valora Lynwood FALCON, MD [address] [place],  [place] [address]  Primary Physician:  Valora Lynwood FALCON, MD  Discussed the use of AI scribe software for clinical note transcription with the patient, who gave verbal consent to proceed.  History of Present Illness Lisa Fuentes is a 63 year old female with chronic back pain who presents with worsening leg pain and numbness.  She experiences severe back pain radiating to her legs for the past three months, unrelieved by epidural injections and facet injections, with the most recent in October. The leg pain, previously extending past her knees, now remains above the knees. She describes a sensation of 'catching' in the midsection when turning.  Numbness and tingling affect all fingers and both the front and back of her hands, persisting despite surgeries on both hands. Numbness is also present on the inside of her lower leg, with difficulty feeling sensations such as tapping.  She is unable to undergo MRI due to a defibrillator. She takes oxycodone , typically one tablet three times a day, but often takes two at a time for better relief. Multiple pain patches are used but are insufficient for managing her pain.  Conservative measures:  Physical therapy:  has not participated in Multimodal medical therapy including regular antiinflammatories: Oxycodone , Gabapentin   Injections: 11/07/2023 L3-L5 Bilateral ESI  Past Surgery: no spine surgery   The symptoms are causing a significant impact on the patient's life.   I have utilized the care everywhere function in epic to review the outside records available from external health systems.  Review of Systems:  A 10 point review of systems is negative, except for the pertinent positives and negatives detailed in the HPI.  Past Medical History: Past Medical History:  Diagnosis Date   AICD (automatic cardioverter/defibrillator) present 2008   a.)  Medtronic device placed 2008. b.) replaced 06/21/2013 (Medtronic Evera XT single-chamber ICD; model number [id number]).   Anemia    Anxiety    Aortic dilatation 03/24/2019   a.) TTE 03/24/2019: Ao root measured 37 mm. b.) TTE 03/21/2020: Ao root measured 37 mm.   Arthritis    Depression    Difficult intubation    DOE (dyspnea on exertion)    Endometriosis of vagina 09/2013   Intra-operative findings of endometriosis implants on cervical stump   Essential hypertension    GERD (gastroesophageal reflux disease)    Headache    HFrEF (heart failure with reduced ejection fraction) (HCC)    a.) TTE 02/18/2014: EF 40-45%; tiv AR, mild MR/TR/PR; G1DD. b.) TTE 02/22/2017: EF 45-50%; mild LVH; triv PR; G1DD. c.) TTE 03/24/2019: EF 45-50%; glob HK, mild LVH; triv MR/TR/PR. d.) TTE 03/21/2020: EF 40-45%; glob HK.   History of 2019 novel coronavirus disease (COVID-19) 02/02/2020   History of 2019 novel coronavirus disease (COVID-19) 02/02/2020   Hypokalemia    Hypothyroidism    a.) s/p radioactive iodine  Tx; on levothyroxine    Mixed hyperlipidemia    Nonischemic cardiomyopathy (HCC)    a.) TTE 02/18/2014: EF 40-45%. b.) TTE 02/22/2017: EF 45-50%. c.) TTE 03/24/2019: EF 45-50%. d.) TTE 03/21/2020: EF 40-45%   OSA on CPAP    Panic attacks    PSVT (paroxysmal supraventricular tachycardia) 03/30/2020   a.) Holter 03/30/2020 --> 2 runs; fastest lasting 4 beats (184 bpm); longest lasting 4 beats (113 bpm)   Pulmonary embolism (HCC)    Tachycardia     Past Surgical History: Past Surgical History:  Procedure Laterality Date  ANTERIOR AND POSTERIOR REPAIR N/A 11/25/2017   Procedure: ANTERIOR (CYSTOCELE) AND POSTERIOR REPAIR (RECTOCELE);  Surgeon: Connell Davies, MD;  Location: [facility];  Service: Gynecology;  Laterality: N/A;   BREAST CYST ASPIRATION Left 03/22/2014   neg/ done by Dr Dellie   CARDIAC CATHETERIZATION  2003   [facility]: No significant coronary artery disease with reduced ejection fraction.    CARDIAC DEFIBRILLATOR PLACEMENT  04/2006   replaced 05/2013   CARDIAC DEFIBRILLATOR PLACEMENT  06/21/2013   Procedure:  CARDIAC DEFIBRILLATOR PLACEMENT (Medtronic single-chamber ICD, model number Sylvan HEWS, model number [id number]): Location: [facility]; Surgeon: Franky Ned, MD   CARPAL TUNNEL RELEASE Right    COLONOSCOPY  2015   COLONOSCOPY N/A 10/13/2020   Procedure: COLONOSCOPY;  Surgeon: Onita Elspeth Sharper, DO;  Location: [facility];  Service: Gastroenterology;  Laterality: N/A;   COLONOSCOPY WITH PROPOFOL  N/A 10/04/2015   Procedure: COLONOSCOPY WITH PROPOFOL ;  Surgeon: Rogelia Copping, MD;  Location: [facility] ENDOSCOPY;  Service: Endoscopy;  Laterality: N/A;   CORONARY ANGIOPLASTY     CYSTOSCOPY  11/15/2014   Procedure: CYSTOSCOPY;  Surgeon: Davies Connell, MD;  Location: [facility];  Service: Gynecology;;   DIAGNOSTIC LAPAROSCOPY     ETHMOIDECTOMY Right 07/19/2021   Procedure: TOTAL ETHMOIDECTOMY WITH FRONTAL SINUS EXPLORATION;  Surgeon: Edda Mt, MD;  Location: [facility];  Service: ENT;  Laterality: Right;   FOOT SURGERY Bilateral    heer spur and bunion   IMAGE GUIDED SINUS SURGERY N/A 07/19/2021   Procedure: IMAGE GUIDED SINUS SURGERY;  Surgeon: Edda Mt, MD;  Location: [facility];  Service: ENT;  Laterality: N/A;   KNEE ARTHROSCOPY Left 03/02/2015   Procedure: ARTHROSCOPY  LEFT KNEE, PARTIAL LATERAL  MENISECTOMY, SYNOVECTOMY, MEDIAL & LATERAL CHONDROPLASTY;  Surgeon: Franky Cranker, MD;  Location: [facility];  Service: Orthopedics;  Laterality: Left;   LAPAROSCOPIC SALPINGO OOPHERECTOMY Left 11/15/2014   Procedure: LAPAROSCOPIC OOPHORECTOMY;  Surgeon: Davies Connell, MD;  Location: [facility];  Service: Gynecology;  Laterality: Left;   LAPAROSCOPIC SALPINGOOPHERECTOMY Right 09/2013   also with left salpingectomy   LEFT HEART CATH AND CORONARY ANGIOGRAPHY N/A 03/28/2020   Procedure: LEFT HEART CATH AND CORONARY ANGIOGRAPHY;  Surgeon: Darron Deatrice LABOR, MD;  Location: [facility]  [facility];  Service: Cardiovascular;  Laterality: N/A;   MAXILLARY ANTROSTOMY Right 07/19/2021   Procedure: MAXILLARY ANTROSTOMY WITH REMOVAL TISSUE;  Surgeon: Edda Mt, MD;  Location: [facility];  Service: ENT;  Laterality: Right;   MINOR HEMORRHOIDECTOMY     NASAL TURBINATE REDUCTION Bilateral 07/19/2021   Procedure: INFERIOR TURBINATE REDUCTION;  Surgeon: Edda Mt, MD;  Location: [facility];  Service: ENT;  Laterality: Bilateral;   PULMONARY THROMBECTOMY Bilateral 10/03/2022   Procedure: PULMONARY THROMBECTOMY;  Surgeon: Marea Selinda RAMAN, MD;  Location: [facility];  Service: Cardiovascular;  Laterality: Bilateral;   RIGHT HEART CATH N/A 10/30/2023   Procedure: RIGHT HEART CATH;  Surgeon: Cherrie Toribio SAUNDERS, MD;  Location: [facility];  Service: Cardiovascular;  Laterality: N/A;   SEPTOPLASTY N/A 07/19/2021   Procedure: SEPTOPLASTY;  Surgeon: Edda Mt, MD;  Location: [facility];  Service: ENT;  Laterality: N/A;   TONSILLECTOMY     TOTAL ABDOMINAL HYSTERECTOMY     TRACHELECTOMY N/A 11/15/2014   Procedure: TRACHELECTOMY;  Surgeon: Davies Connell, MD;  Location: [facility];  Service: Gynecology;  Laterality: N/A;   TRIGGER FINGER RELEASE Right    TUBAL LIGATION Bilateral     Allergies: Allergies as of 12/16/2023 - Review Complete 10/30/2023  Allergen Reaction Noted  Sulfa antibiotics Hives 04/30/2013   Sulfamethoxazole-trimethoprim Hives 02/02/2014   Jardiance  [empagliflozin ] Rash 10/30/2022    Medications:  Current Outpatient Medications:    Acetaminophen  (TYLENOL  ARTHRITIS PAIN PO), Take 1,300 mg by mouth every 6 (six) hours as needed (Pain)., Disp: , Rfl:    apixaban  (ELIQUIS ) 5 MG TABS tablet, Take 5 mg by mouth 2 (two) times daily., Disp: , Rfl:    digoxin  (LANOXIN ) 0.125 MG tablet, Take 0.5 tablets (0.0625 mg total) by mouth daily. Reduced from 0.125 mg. (Patient taking differently: Take 0.125 mg by mouth daily. Reduced from 0.125 mg.), Disp: , Rfl:    esomeprazole  (NEXIUM) 40 MG capsule, Take 40 mg by mouth every morning. , Disp: , Rfl:    fluticasone (FLONASE) 50 MCG/ACT nasal spray, Place 2 sprays into both nostrils daily., Disp: , Rfl:    furosemide  (LASIX ) 40 MG tablet, Take 1 tablet (40 mg total) by mouth daily., Disp: 30 tablet, Rfl: 5   gabapentin  (NEURONTIN ) 100 MG capsule, Take 100 mg by mouth 3 (three) times daily., Disp: , Rfl:    levothyroxine  (SYNTHROID ) 125 MCG tablet, Take 125 mcg by mouth every morning. (Patient taking differently: Take 150 mcg by mouth every morning.), Disp: , Rfl:    metoprolol  succinate (TOPROL -XL) 25 MG 24 hr tablet, Take 0.5 tablets (12.5 mg total) by mouth daily. Reduced from 50 mg daily. (Patient taking differently: Take 25 mg by mouth daily. Reduced from 50 mg daily.), Disp: 15 tablet, Rfl: 2   oxyCODONE -acetaminophen  (PERCOCET/ROXICET) 5-325 MG tablet, Take 1 tablet by mouth 3 (three) times daily as needed for severe pain (pain score 7-10). Home med., Disp: , Rfl:    rosuvastatin  (CRESTOR ) 20 MG tablet, Take 1 tablet (20 mg total) by mouth daily., Disp: 90 tablet, Rfl: 3   sacubitril -valsartan  (ENTRESTO ) 24-26 MG, Take 1 tablet by mouth 2 (two) times daily. (Patient taking differently: Take 1 tablet by mouth 2 (two) times daily.), Disp: , Rfl:    sertraline  (ZOLOFT ) 100 MG tablet, Take 200 mg by mouth daily., Disp: , Rfl:    spironolactone  (ALDACTONE ) 25 MG tablet, Take 1 tablet (25 mg total) by mouth daily., Disp: 90 tablet, Rfl: 3   tirzepatide  (MOUNJARO ) 5 MG/0.5ML Pen, Inject 5 mg into the skin once a week., Disp: 2 mL, Rfl: 1  Social History: Social History   Tobacco Use   Smoking status: Never   Smokeless tobacco: Never  Vaping Use   Vaping status: Never Used  Substance Use Topics   Alcohol use: Yes    Alcohol/week: 0.0 standard drinks of alcohol    Comment: occassionally   Drug use: No    Family Medical History: Family History  Problem Relation Age of Onset   Hypertension Mother    Hyperlipidemia  Mother    Stroke Mother    Colon cancer Mother    Breast cancer Sister 69   Ovarian cancer Sister    Throat cancer Brother     Physical Examination: NEUROLOGICAL:     Awake, alert, oriented to person, place, and time.  Speech is clear and fluent.   Cranial Nerves: Pupils equal round and reactive to light.  Facial tone is symmetric.  Facial sensation is symmetric. Shoulder shrug is symmetric. Tongue protrusion is midline.    Strength:  Side Iliopsoas Quads Hamstring PF DF EHL  R 5 5 5 5 5 5   L 5 5 5 5 5 5    Hyporeflexic throughout.  Decree sensation in the left medial lower  leg from mid tibia down to the medial malleolus     Forward carriage, antalgic gait  Imaging: arrative & Impression  CLINICAL DATA:  Low back and thigh pain for 2 months. Difficulty walking. No acute injury or prior relevant surgery.   EXAM: CT LUMBAR SPINE WITHOUT CONTRAST   TECHNIQUE: Multidetector CT imaging of the lumbar spine was performed without intravenous contrast administration. Multiplanar CT image reconstructions were also generated.   RADIATION DOSE REDUCTION: This exam was performed according to the departmental dose-optimization program which includes automated exposure control, adjustment of the mA and/or kV according to patient size and/or use of iterative reconstruction technique.   COMPARISON:  CT lumbar spine 08/07/2017.   FINDINGS: Segmentation: There are 5 lumbar type vertebral bodies.   Alignment: 3 mm of degenerative anterolisthesis at L4-5.   Vertebrae: No evidence of acute fracture, pars defect or aggressive osseous lesion.   Paraspinal and other soft tissues: Unremarkable.   Disc levels:   No significant disc space findings at T12-L1 or L1-2.   L2-3: Preserved disc height. Mild bilateral facet hypertrophy. No spinal stenosis or significant foraminal narrowing.   L3-4: Mild loss of disc height with mild disc bulging and endplate osteophytes. Mild to moderate  facet and ligamentous hypertrophy. Resulting mild spinal stenosis and mild lateral recess narrowing bilaterally, similar to previous study. There is mild foraminal narrowing bilaterally without L3 nerve root impingement.   L4-5: Mildly progressive loss of disc height with disc bulging and uncovering secondary to moderate facet and ligamentous hypertrophy. Mild spinal stenosis with mild lateral recess and foraminal narrowing bilaterally. No definite nerve root impingement.   L5-S1: Chronic loss of disc height with mild disc bulging and endplate osteophytes. Mild bilateral facet hypertrophy. No spinal stenosis or nerve root impingement.   IMPRESSION: 1. No acute findings or clear explanation for the patient's symptoms. 2. Mildly progressive spondylosis at L4-5 with resulting mild spinal stenosis and mild lateral recess and foraminal narrowing bilaterally. 3. Stable mild spinal stenosis and mild lateral recess narrowing bilaterally at L3-4. 4. Chronic degenerative disc disease at L5-S1 without spinal stenosis or nerve root impingement.     Electronically Signed   By: Elsie Perone M.D.   On: 10/25/2023 17:35     I have personally reviewed the images and agree with the above interpretation.  Assessment and Plan Assessment & Plan Lumbar spondylolisthesis with chronic low back pain and radicular symptoms Chronic low back pain with radicular symptoms unresponsive to epidural injections. CT scan shows lumbar spondylolisthesis with potential nerve and joint issues. Differential includes nerve compression or joint instability. - Ordered flexion-extension x-rays to assess spinal slippage. - Referred to physical therapy for back pain management. - Continue current pain management regimen with pain specialist. - Ordered bone density given appearance of bone quality on ct scan.   Peripheral neuropathy with numbness and tingling of hands and lower leg Numbness and tingling in hands and  lower leg, possibly related to lumbar spondylolisthesis. Previous nerve tests inconclusive. Differential includes radiculopathy or peripheral neuropathy. - Ordered EMG nerve conduction study to assess for neuropathy or radiculopathy. - Referred to neurology for further evaluation of nerve function.     Penne MICAEL Sharps MD/MSCR Neurosurgery

## 2023-12-17 ENCOUNTER — Ambulatory Visit: Payer: Medicare HMO

## 2023-12-17 DIAGNOSIS — I5022 Chronic systolic (congestive) heart failure: Secondary | ICD-10-CM

## 2023-12-18 ENCOUNTER — Ambulatory Visit: Admitting: Neurosurgery

## 2023-12-18 ENCOUNTER — Ambulatory Visit: Payer: Self-pay | Admitting: Cardiology

## 2023-12-18 LAB — CUP PACEART REMOTE DEVICE CHECK
Battery Remaining Longevity: 21 mo
Battery Voltage: 2.93 V
Brady Statistic RV Percent Paced: 0.01 %
Date Time Interrogation Session: 20251118001704
HighPow Impedance: 47 Ohm
HighPow Impedance: 60 Ohm
Implantable Lead Connection Status: 753985
Implantable Lead Implant Date: 20150508
Implantable Lead Location: 753860
Implantable Lead Model: 185
Implantable Lead Serial Number: 194163
Implantable Pulse Generator Implant Date: 20150508
Lead Channel Impedance Value: 475 Ohm
Lead Channel Impedance Value: 513 Ohm
Lead Channel Pacing Threshold Amplitude: 1 V
Lead Channel Pacing Threshold Pulse Width: 0.4 ms
Lead Channel Sensing Intrinsic Amplitude: 11.75 mV
Lead Channel Sensing Intrinsic Amplitude: 11.75 mV
Lead Channel Setting Pacing Amplitude: 2 V
Lead Channel Setting Pacing Pulse Width: 0.4 ms
Lead Channel Setting Sensing Sensitivity: 0.3 mV
Zone Setting Status: 755011

## 2023-12-20 NOTE — Progress Notes (Signed)
 Remote ICD Transmission

## 2023-12-23 DIAGNOSIS — M48062 Spinal stenosis, lumbar region with neurogenic claudication: Secondary | ICD-10-CM | POA: Diagnosis not present

## 2023-12-24 ENCOUNTER — Telehealth: Payer: Self-pay | Admitting: Family

## 2023-12-24 DIAGNOSIS — Z780 Asymptomatic menopausal state: Secondary | ICD-10-CM | POA: Diagnosis not present

## 2023-12-24 NOTE — Progress Notes (Unsigned)
 [facility]  NOTE   Primary Care: Valora Agent, MD Primary Cardiologist: Redell Cave, MD  HF provider: Gardenia Led, MD   Chief complaint: Heart failure  HPI:  Lisa Fuentes is a 63  y.o. female with a hx of systolic HF due to NICM, VT s/p MDT ICD morbid obesity, hypertension, PE s/p endovascular thrombolysis 9/24 on Eliquis , OSA and RUE DVT (06/23). Diagnosed with systolic HF in 2000. Cath 2003 no CAD. Had repeat cath 2022 with normal cors.  Had RUE DVT 6/23 after trauma. Upper extremity u/s with multiple RUE DVTs. CTA chest LLL segmental and subsegmental pulmonary emboli without evidence of RV strain. Started on Eliquis . She completed several months of Eliquis , and it was then discontinued per hematology.   Admitted [facility] 10/01/2022. CTA chest with moderate volume of acute PE with RV strain. She was started on heparin . She underwent pulmonary thrombectomy. D/c'd on Eliquis .    Nuclear stress test on 10/03/2022,  LVEF 44% with moderate fixed basal septal defect without ischemia. Echo 10/03/22 EF 30-35% mild TR. Was in the ED 10/10/22 due to mid-sternal chest pain and shortness of breath. Labs normal.   Echo 7/25 EF 30-35% RV ok RVSP normal Personally reviewed   CPX test 8/25 FVC 2.84 (95%)      FEV1 2.48(105%)        FEV1/FVC 87 (109%)        MVV 132 (135%)   pVO2: 8.9 (51%) VE/VCO2 slope:  51 OUES: 0.6 (30% of predicted) Peak RER: 1.22 (see  note below about RER) Ventilatory Threshold: 6.4 37% predicted or measured peak VO2    Here for f/u. Says she gets SOB easily - can't get to mailbox. She is worried it is her thyroid . Mild LE edema. No orthopnea or PND. Dropped of CR after 4 weeks Compliant with meds including Eliquis .   ICD interrogation: No VT/AF volume ok activity level dropped 3hr/day -> 1.5hr/day    Cardiac studies:  Echo 02/18/14: EF 40-45% with Grade I DD Echo 02/22/17: EF 45-50% with Grade I DD Echo 03/24/19: EF 45-50% with mild LVH, Grade I DD,  borderline aortic root dilatation 37 mm Echo 03/21/20: EF 40-45% with Grade I DD, borderline aortic root dilatation 37 mm Echo 10/03/22: EF 30-35% with moderate LVH, Grade I DD, mild LAE  LHC 03/28/20:  1.  Normal coronary arteries. 2.  Left ventricular angiography was not performed.  EF was mildly reduced by echo. 3.  Mildly elevated left ventricular end-diastolic pressure 20 mmHg.   SH:  Social History   Socioeconomic History   Marital status: Married    Spouse name: Jimmy   Number of children: 2   Years of education: Not on file   Highest education level: Not on file  Occupational History   Not on file  Tobacco Use   Smoking status: Never   Smokeless tobacco: Never  Vaping Use   Vaping status: Never Used  Substance and Sexual Activity   Alcohol use: Yes    Alcohol/week: 0.0 standard drinks of alcohol    Comment: occassionally   Drug use: No   Sexual activity: Not Currently    Birth control/protection: Surgical  Other Topics Concern   Not on file  Social History Narrative   Not on file   Social Drivers of Health   Financial Resource Strain: Low Risk  (03/11/2023)   Received from [facility]   Overall Financial Resource Strain (CARDIA)    Difficulty of Paying  Living Expenses: Not hard at all  Food Insecurity: No Food Insecurity (08/27/2023)   Hunger Vital Sign    Worried About Running Out of Food in the Last Year: Never true    Ran Out of Food in the Last Year: Never true  Transportation Needs: No Transportation Needs (08/27/2023)   PRAPARE - Administrator, Civil Service (Medical): No    Lack of Transportation (Non-Medical): No  Physical Activity: Not on file  Stress: Not on file  Social Connections: Not on file  Intimate Partner Violence: Not At Risk (08/27/2023)   Humiliation, Afraid, Rape, and Kick questionnaire    Fear of Current or Ex-Partner: No    Emotionally Abused: No    Physically Abused: No    Sexually Abused: No    FH:   Family History  Problem Relation Age of Onset   Hypertension Mother    Hyperlipidemia Mother    Stroke Mother    Colon cancer Mother    Breast cancer Sister 19   Ovarian cancer Sister    Throat cancer Brother     Past Medical History:  Diagnosis Date   AICD (automatic cardioverter/defibrillator) present 2008   a.) Medtronic device placed 2008. b.) replaced 06/21/2013 (Medtronic Evera XT single-chamber ICD; model number [id number]).   Anemia    Anxiety    Aortic dilatation 03/24/2019   a.) TTE 03/24/2019: Ao root measured 37 mm. b.) TTE 03/21/2020: Ao root measured 37 mm.   Arthritis    Depression    Difficult intubation    DOE (dyspnea on exertion)    Endometriosis of vagina 09/2013   Intra-operative findings of endometriosis implants on cervical stump   Essential hypertension    GERD (gastroesophageal reflux disease)    Headache    HFrEF (heart failure with reduced ejection fraction) (HCC)    a.) TTE 02/18/2014: EF 40-45%; tiv AR, mild MR/TR/PR; G1DD. b.) TTE 02/22/2017: EF 45-50%; mild LVH; triv PR; G1DD. c.) TTE 03/24/2019: EF 45-50%; glob HK, mild LVH; triv MR/TR/PR. d.) TTE 03/21/2020: EF 40-45%; glob HK.   History of 2019 novel coronavirus disease (COVID-19) 02/02/2020   History of 2019 novel coronavirus disease (COVID-19) 02/02/2020   Hypokalemia    Hypothyroidism    a.) s/p radioactive iodine  Tx; on levothyroxine    Mixed hyperlipidemia    Nonischemic cardiomyopathy (HCC)    a.) TTE 02/18/2014: EF 40-45%. b.) TTE 02/22/2017: EF 45-50%. c.) TTE 03/24/2019: EF 45-50%. d.) TTE 03/21/2020: EF 40-45%   OSA on CPAP    Panic attacks    PSVT (paroxysmal supraventricular tachycardia) 03/30/2020   a.) Holter 03/30/2020 --> 2 runs; fastest lasting 4 beats (184 bpm); longest lasting 4 beats (113 bpm)   Pulmonary embolism (HCC)    Tachycardia     Current Outpatient Medications  Medication Sig Dispense Refill   Acetaminophen  (TYLENOL  ARTHRITIS PAIN PO) Take 1,300 mg by mouth  every 6 (six) hours as needed (Pain).     apixaban  (ELIQUIS ) 5 MG TABS tablet Take 5 mg by mouth 2 (two) times daily.     digoxin  (LANOXIN ) 0.125 MG tablet Take 0.5 tablets (0.0625 mg total) by mouth daily. Reduced from 0.125 mg. (Patient taking differently: Take 0.125 mg by mouth daily. Reduced from 0.125 mg.)     esomeprazole (NEXIUM) 40 MG capsule Take 40 mg by mouth every morning.      fluticasone (FLONASE) 50 MCG/ACT nasal spray Place 2 sprays into both nostrils daily.     furosemide  (LASIX ) 40 MG  tablet Take 1 tablet (40 mg total) by mouth daily. 30 tablet 5   gabapentin  (NEURONTIN ) 100 MG capsule Take 100 mg by mouth 3 (three) times daily.     levothyroxine  (SYNTHROID ) 137 MCG tablet Take 137 mcg by mouth daily before breakfast.     metoprolol  succinate (TOPROL -XL) 25 MG 24 hr tablet Take 0.5 tablets (12.5 mg total) by mouth daily. Reduced from 50 mg daily. (Patient taking differently: Take 25 mg by mouth daily. Reduced from 50 mg daily.) 15 tablet 2   oxyCODONE -acetaminophen  (PERCOCET/ROXICET) 5-325 MG tablet Take 1 tablet by mouth 3 (three) times daily as needed for severe pain (pain score 7-10). Home med.     sacubitril -valsartan  (ENTRESTO ) 24-26 MG Take 1 tablet by mouth 2 (two) times daily. (Patient taking differently: Take 1 tablet by mouth 2 (two) times daily.)     sertraline  (ZOLOFT ) 100 MG tablet Take 200 mg by mouth daily.     spironolactone  (ALDACTONE ) 25 MG tablet Take 1 tablet (25 mg total) by mouth daily. 90 tablet 3   tirzepatide  (MOUNJARO ) 5 MG/0.5ML Pen Inject 5 mg into the skin once a week. 2 mL 1   No current facility-administered medications for this visit.   There were no vitals filed for this visit.   Wt Readings from Last 3 Encounters:  12/16/23 218 lb 6 oz (99.1 kg)  10/30/23 212 lb (96.2 kg)  10/22/23 215 lb (97.5 kg)   Lab Results  Component Value Date   CREATININE 0.83 10/22/2023   CREATININE 0.68 09/11/2023   CREATININE 0.90 08/30/2023   PHYSICAL  EXAM: There were no vitals filed for this visit.  General:  Well appearing. No resp difficulty HEENT: normal Neck: supple. no JVD. Carotids 2+ bilat; no bruits. No lymphadenopathy or thryomegaly appreciated. Cor: PMI nondisplaced. Regular rate & rhythm. No rubs, gallops or murmurs. Lungs: clear Abdomen: obese soft, nontender, nondistended. No hepatosplenomegaly. No bruits or masses. Good bowel sounds. Extremities: no cyanosis, clubbing, rash, edema Neuro: alert & orientedx3, cranial nerves grossly intact. moves all 4 extremities w/o difficulty. Affect pleasant  ASSESSMENT & PLAN:  1. NICM with reduced ejection fraction- - cath 2003 and 2022 no CAD - Echo 02/18/14: EF 40-45% with Grade I DD - Echo 02/22/17: EF 45-50% with Grade I DD - Echo 03/24/19: EF 45-50% with mild LVH, Grade I DD, borderline aortic root dilatation 37 mm  -Echo 03/21/20: EF 40-45% with Grade I DD, borderline aortic root dilatation 37 mm - Echo 10/03/22: EF 30-35% with moderate LVH, Grade I DD, mild LAE - 12/13/22: Echo LVEF 25-30%, normal RV function.  - 12/24 Myoview   EF 40% - Echo 7/25 EF 30-35% RV ok RVSP normal Personally reviewed - CPX 8/25: pVO2: 8.9 (51%) VE/VCO2 slope:  51 OUES: 0.6 (30% of predicted) Peak RER: 1.22 (see  note below about RER) Ventilatory Threshold: 6.4 37% predicted or measured peak VO2  - NYHA IIIB  - continue entresto  24/26mg  BID - continues to take lasix  20mg  every other day; euvolemic on exam today.  - continue spironolactone  25mg  daily - continue metoprolol  succinate 50 mg daily - continue digoxin  0.125 daily - will continue to hold SGLT2i; - CPX testing and symptoms seem out of proportion to echo findings. CPX suggests need for advanced therapies Will need RHC to further evaluate. Will also get VQ to assess for CTEPH  2. RV Failure- - TTE from 10/03/22 with at least moderately reduced RV function 2/2 acute PE; suspect this has improved after thrombectomy.  -  TTE 12/13/22 w/ LVEF 25%-30%,  with low normal RV function now. Euvolemic on exam with no clinical signs of RV failure - Echo 7/25 EF 30-35% RV ok RVSP normal - Check VQ as above   3. H/o VT- - s/p MDT ICD - Device interrogation as above. No further VT  4. Recurrent DVT/PE- - s/p thrombectomy - 12/13/22: Echo LVEF 25-30%, normal RV function.  - continue lifelong Eliquis  -> after 1 year can decrease to 2.5 bid per [employer]-EXT data - No evidence of RV strain on echo  - Check VQ and RHC  5. Morbid obesity- - Body mass index is There is no height or weight on file to calculate BMI. - On Mounjao  6. OSA - compliant with OSA  I spent a total of 50 minutes today: 1) reviewing the patient's medical records including previous charts, labs and recent notes from other providers; 2) examining the patient and counseling them on their medical issues/explaining the plan of care; 3) adjusting meds as needed and 4) ordering lab work or other needed tests.    Ellouise DELENA Class, FNP  7:12 PM

## 2023-12-24 NOTE — Telephone Encounter (Signed)
 Called to confirm/remind patient of their appointment at the [facility] on 12/25/23.   Appointment:   [x] Confirmed  [] Left mess   [] No answer/No voice mail  [] VM Full/unable to leave message  [] Phone not in service  Patient reminded to bring all medications and/or complete list.  Confirmed patient has transportation. Gave directions, instructed to utilize valet parking.

## 2023-12-25 ENCOUNTER — Encounter: Payer: Self-pay | Admitting: Family

## 2023-12-25 ENCOUNTER — Ambulatory Visit: Attending: Family | Admitting: Family

## 2023-12-25 ENCOUNTER — Ambulatory Visit: Admitting: Family

## 2023-12-25 ENCOUNTER — Ambulatory Visit: Payer: Self-pay | Admitting: Neurosurgery

## 2023-12-25 VITALS — BP 103/73 | HR 79 | Wt 217.8 lb

## 2023-12-25 DIAGNOSIS — R21 Rash and other nonspecific skin eruption: Secondary | ICD-10-CM | POA: Insufficient documentation

## 2023-12-25 DIAGNOSIS — I428 Other cardiomyopathies: Secondary | ICD-10-CM | POA: Diagnosis not present

## 2023-12-25 DIAGNOSIS — Z86718 Personal history of other venous thrombosis and embolism: Secondary | ICD-10-CM | POA: Insufficient documentation

## 2023-12-25 DIAGNOSIS — E039 Hypothyroidism, unspecified: Secondary | ICD-10-CM | POA: Insufficient documentation

## 2023-12-25 DIAGNOSIS — Z7901 Long term (current) use of anticoagulants: Secondary | ICD-10-CM | POA: Diagnosis not present

## 2023-12-25 DIAGNOSIS — Z6834 Body mass index (BMI) 34.0-34.9, adult: Secondary | ICD-10-CM | POA: Diagnosis not present

## 2023-12-25 DIAGNOSIS — Q219 Congenital malformation of cardiac septum, unspecified: Secondary | ICD-10-CM | POA: Insufficient documentation

## 2023-12-25 DIAGNOSIS — E782 Mixed hyperlipidemia: Secondary | ICD-10-CM | POA: Insufficient documentation

## 2023-12-25 DIAGNOSIS — I472 Ventricular tachycardia, unspecified: Secondary | ICD-10-CM | POA: Diagnosis not present

## 2023-12-25 DIAGNOSIS — Z79899 Other long term (current) drug therapy: Secondary | ICD-10-CM | POA: Insufficient documentation

## 2023-12-25 DIAGNOSIS — Z86711 Personal history of pulmonary embolism: Secondary | ICD-10-CM | POA: Insufficient documentation

## 2023-12-25 DIAGNOSIS — I50810 Right heart failure, unspecified: Secondary | ICD-10-CM

## 2023-12-25 DIAGNOSIS — G4733 Obstructive sleep apnea (adult) (pediatric): Secondary | ICD-10-CM | POA: Diagnosis not present

## 2023-12-25 DIAGNOSIS — Z79890 Hormone replacement therapy (postmenopausal): Secondary | ICD-10-CM | POA: Diagnosis not present

## 2023-12-25 DIAGNOSIS — I110 Hypertensive heart disease with heart failure: Secondary | ICD-10-CM | POA: Insufficient documentation

## 2023-12-25 DIAGNOSIS — I5022 Chronic systolic (congestive) heart failure: Secondary | ICD-10-CM | POA: Diagnosis not present

## 2023-12-25 DIAGNOSIS — Z8616 Personal history of COVID-19: Secondary | ICD-10-CM | POA: Diagnosis not present

## 2023-12-25 MED ORDER — FUROSEMIDE 40 MG PO TABS: 20.0000 mg | ORAL_TABLET | Freq: Every day | ORAL | 2 refills | Status: DC

## 2023-12-25 MED ORDER — FUROSEMIDE 40 MG PO TABS: 40.0000 mg | ORAL_TABLET | Freq: Every day | ORAL | 2 refills | Status: DC

## 2023-12-25 NOTE — Patient Instructions (Signed)
 Medication Changes:  FOR THREE DAYS ONLY Take Furosemide  40mg  daily THEN START Furosemide  20mg  (1/2 tab) daily  Lab Work:  Go over to the [facility]. Go pass the gift shop and have your blood work completed.   We will only call you if the results are abnormal or if the provider would like to make medication changes.  No news is good news.   Follow-Up in: Please follow up with the [facility] in 1 month with Ellouise Class, FNP.   Thank you for choosing [facility].    At the [facility], you and your health needs are our priority. We have a designated team specialized in the treatment of Heart Failure. This Care Team includes your primary Heart Failure Specialized Cardiologist (physician), Advanced Practice Providers (APPs- Physician Assistants and Nurse Practitioners), and Pharmacist who all work together to provide you with the care you need, when you need it.   You may see any of the following providers on your designated Care Team at your next follow up:  Dr. Toribio Fuel Dr. Ezra Shuck Dr. Ria Commander Dr. Morene Brownie Ellouise Class, FNP Jaun Bash, RPH-CPP  Please be sure to bring in all your medications bottles to every appointment.   Need to Contact Us :  If you have any questions or concerns before your next appointment please send us  a message through [place] or call our office at [phone].    TO LEAVE A MESSAGE FOR THE NURSE SELECT OPTION 2, PLEASE LEAVE A MESSAGE INCLUDING: YOUR NAME DATE OF BIRTH CALL BACK NUMBER REASON FOR CALL**this is important as we prioritize the call backs  YOU WILL RECEIVE A CALL BACK THE SAME DAY AS LONG AS YOU CALL BEFORE 4:00 PM

## 2023-12-30 ENCOUNTER — Telehealth: Payer: Self-pay | Admitting: Family

## 2023-12-30 MED ORDER — SPIRONOLACTONE 25 MG PO TABS: 25.0000 mg | ORAL_TABLET | Freq: Every day | ORAL | 3 refills | Status: AC

## 2023-12-30 NOTE — Telephone Encounter (Signed)
Refilled to preferred pharmacy.

## 2023-12-31 DIAGNOSIS — M48062 Spinal stenosis, lumbar region with neurogenic claudication: Secondary | ICD-10-CM | POA: Diagnosis not present

## 2024-01-01 DIAGNOSIS — M1812 Unilateral primary osteoarthritis of first carpometacarpal joint, left hand: Secondary | ICD-10-CM | POA: Diagnosis not present

## 2024-01-02 DIAGNOSIS — M48062 Spinal stenosis, lumbar region with neurogenic claudication: Secondary | ICD-10-CM | POA: Diagnosis not present

## 2024-01-09 ENCOUNTER — Other Ambulatory Visit: Payer: Self-pay | Admitting: Pharmacist

## 2024-01-09 ENCOUNTER — Other Ambulatory Visit: Payer: Self-pay

## 2024-01-09 DIAGNOSIS — M48062 Spinal stenosis, lumbar region with neurogenic claudication: Secondary | ICD-10-CM | POA: Diagnosis not present

## 2024-01-09 MED ORDER — MOUNJARO 7.5 MG/0.5ML ~~LOC~~ SOAJ: 7.5000 mg | SUBCUTANEOUS | 0 refills | Status: DC

## 2024-01-09 MED ORDER — SACUBITRIL-VALSARTAN 24-26 MG PO TABS: 1.0000 | ORAL_TABLET | Freq: Two times a day (BID) | ORAL | 5 refills | Status: AC

## 2024-01-09 NOTE — Telephone Encounter (Signed)
 Pt called requesting refill on entresto  to [facility] on [address]. Script refilled to preferred pharmacy.

## 2024-01-16 ENCOUNTER — Ambulatory Visit (HOSPITAL_COMMUNITY): Attending: Vascular Surgery | Admitting: Vascular Surgery

## 2024-01-16 ENCOUNTER — Ambulatory Visit (HOSPITAL_COMMUNITY): Admission: RE | Admit: 2024-01-16 | Source: Ambulatory Visit

## 2024-01-27 ENCOUNTER — Telehealth: Payer: Self-pay | Admitting: Family

## 2024-01-27 NOTE — Telephone Encounter (Signed)
 Called to confirm/remind patient of their appointment at the [facility] on 01/28/24.   Appointment:   [x] Confirmed  [] Left mess   [] No answer/No voice mail  [] VM Full/unable to leave message  [] Phone not in service  Patient reminded to bring all medications and/or complete list.  Confirmed patient has transportation. Gave directions, instructed to utilize valet parking.

## 2024-01-27 NOTE — Progress Notes (Unsigned)
 "  ADVANCED HF CLINIC  NOTE   Primary Care: Valora Agent, MD Primary Cardiologist: Redell Cave, MD  HF provider: Rolan Fuel, MD  Chief complaint: fatigue   HPI:  Lisa Fuentes is a 62  y.o. female with a hx of systolic HF due to NICM, VT s/p MDT ICD morbid obesity, hypertension, PE s/p endovascular thrombolysis 9/24 on Eliquis , OSA and RUE DVT (06/23). Diagnosed with systolic HF in 2000. Cath 2003 no CAD. Had repeat cath 2022 with normal cors.  Had RUE DVT 6/23 after trauma. Upper extremity u/s with multiple RUE DVTs. CTA chest LLL segmental and subsegmental pulmonary emboli without evidence of RV strain. Started on Eliquis . She completed several months of Eliquis , and it was then discontinued per hematology.   Admitted [facility] 10/01/2022. CTA chest with moderate volume of acute PE with RV strain. She was started on heparin . She underwent pulmonary thrombectomy. D/c'd on Eliquis .   Nuclear stress test on 10/03/2022,  LVEF 44% with moderate fixed basal septal defect without ischemia. Echo 10/03/22 EF 30-35% mild TR. Was in the ED 10/10/22 due to mid-sternal chest pain and shortness of breath. Labs normal.   Echo 7/25 EF 30-35% RV ok RVSP normal Personally reviewed  CPX test 8/25 FVC 2.84 (95%)      FEV1 2.48(105%)        FEV1/FVC 87 (109%)        MVV 132 (135%)  pVO2: 8.9 (51%) VE/VCO2 slope:  51 OUES: 0.6 (30% of predicted) Peak RER: 1.22 (see  note below about RER) Ventilatory Threshold: 6.4 37% predicted or measured peak VO2    RHC 10/30/23: normal hemodynamics RA = 2 RV = 22/3 PA = 24/10 (16) PCW = 6 Fick cardiac output/index = 7.4/3.6 TC CO/CI = 7.4/3.6 PVR = 13 WU Ao sat = 97% PA sat = 76%, 76% SVC sat = 65% PAPi = 7.0  She presents today for a HF follow-up visit with a chief complaint of minimal fatigue (improving). Has associated pedal edema (improving). Denies any shortness of breath, palpitations, dizziness. Says that her weight ranges from 212-218 pounds at  home. Taking lasix  20mg  3 x / week on M, W, F. Couple of days ago, the pedal edema was much worse. Saw her PCP earlier today and had lab work drawn.    ICD interrogation: No VT/AF. Optivol has been rising since 10/23/23. Activity level at 1.5hr/day    Cardiac studies:  Echo 02/18/14: EF 40-45% with Grade I DD Echo 02/22/17: EF 45-50% with Grade I DD Echo 03/24/19: EF 45-50% with mild LVH, Grade I DD, borderline aortic root dilatation 37 mm Echo 03/21/20: EF 40-45% with Grade I DD, borderline aortic root dilatation 37 mm Echo 10/03/22: EF 30-35% with moderate LVH, Grade I DD, mild LAE  LHC 03/28/20:  1.  Normal coronary arteries. 2.  Left ventricular angiography was not performed.  EF was mildly reduced by echo. 3.  Mildly elevated left ventricular end-diastolic pressure 20 mmHg.   SH:  Social History   Socioeconomic History   Marital status: Married    Spouse name: Jimmy   Number of children: 2   Years of education: Not on file   Highest education level: Not on file  Occupational History   Not on file  Tobacco Use   Smoking status: Never   Smokeless tobacco: Never  Vaping Use   Vaping status: Never Used  Substance and Sexual Activity   Alcohol use: Yes    Alcohol/week: 0.0 standard drinks  of alcohol    Comment: occassionally   Drug use: No   Sexual activity: Not Currently    Birth control/protection: Surgical  Other Topics Concern   Not on file  Social History Narrative   Not on file   Social Drivers of Health   Tobacco Use: Low Risk  (01/01/2024)   Received [facility]   Patient History    Smoking Tobacco Use: Never    Smokeless Tobacco Use: Never    Passive Exposure: Past  Financial Resource Strain: Low Risk  (03/11/2023)   Received from [facility]   Overall Financial Resource Strain (CARDIA)    Difficulty of Paying Living Expenses: Not hard at all  Food Insecurity: No Food Insecurity (08/27/2023)   Epic    Worried About  Running Out of Food in the Last Year: Never true    Ran Out of Food in the Last Year: Never true  Transportation Needs: No Transportation Needs (08/27/2023)   Epic    Lack of Transportation (Medical): No    Lack of Transportation (Non-Medical): No  Physical Activity: Not on file  Stress: Not on file  Social Connections: Not on file  Intimate Partner Violence: Not At Risk (08/27/2023)   Epic    Fear of Current or Ex-Partner: No    Emotionally Abused: No    Physically Abused: No    Sexually Abused: No  Depression (PHQ2-9): Medium Risk (07/31/2023)   Depression (PHQ2-9)    PHQ-2 Score: 9  Alcohol Screen: Not on file  Housing: Low Risk (08/27/2023)   Epic    Unable to Pay for Housing in the Last Year: No    Number of Times Moved in the Last Year: 0    Homeless in the Last Year: No  Utilities: Not At Risk (08/27/2023)   Epic    Threatened with loss of utilities: No  Health Literacy: Not on file    FH:  Family History  Problem Relation Age of Onset   Hypertension Mother    Hyperlipidemia Mother    Stroke Mother    Colon cancer Mother    Breast cancer Sister 38   Ovarian cancer Sister    Throat cancer Brother     Past Medical History:  Diagnosis Date   AICD (automatic cardioverter/defibrillator) present 2008   a.) Medtronic device placed 2008. b.) replaced 06/21/2013 (Medtronic Evera XT single-chamber ICD; model number [id number]).   Anemia    Anxiety    Aortic dilatation 03/24/2019   a.) TTE 03/24/2019: Ao root measured 37 mm. b.) TTE 03/21/2020: Ao root measured 37 mm.   Arthritis    Depression    Difficult intubation    DOE (dyspnea on exertion)    Endometriosis of vagina 09/2013   Intra-operative findings of endometriosis implants on cervical stump   Essential hypertension    GERD (gastroesophageal reflux disease)    Headache    HFrEF (heart failure with reduced ejection fraction) (HCC)    a.) TTE 02/18/2014: EF 40-45%; tiv AR, mild MR/TR/PR; G1DD. b.) TTE  02/22/2017: EF 45-50%; mild LVH; triv PR; G1DD. c.) TTE 03/24/2019: EF 45-50%; glob HK, mild LVH; triv MR/TR/PR. d.) TTE 03/21/2020: EF 40-45%; glob HK.   History of 2019 novel coronavirus disease (COVID-19) 02/02/2020   History of 2019 novel coronavirus disease (COVID-19) 02/02/2020   Hypokalemia    Hypothyroidism    a.) s/p radioactive iodine  Tx; on levothyroxine    Mixed hyperlipidemia    Nonischemic cardiomyopathy (HCC)  a.) TTE 02/18/2014: EF 40-45%. b.) TTE 02/22/2017: EF 45-50%. c.) TTE 03/24/2019: EF 45-50%. d.) TTE 03/21/2020: EF 40-45%   OSA on CPAP    Panic attacks    PSVT (paroxysmal supraventricular tachycardia) 03/30/2020   a.) Holter 03/30/2020 --> 2 runs; fastest lasting 4 beats (184 bpm); longest lasting 4 beats (113 bpm)   Pulmonary embolism (HCC)    Tachycardia     Current Outpatient Medications  Medication Sig Dispense Refill   Acetaminophen  (TYLENOL  ARTHRITIS PAIN PO) Take 1,300 mg by mouth every 6 (six) hours as needed (Pain).     apixaban  (ELIQUIS ) 5 MG TABS tablet Take 5 mg by mouth 2 (two) times daily.     digoxin  (LANOXIN ) 0.125 MG tablet Take 0.5 tablets (0.0625 mg total) by mouth daily. Reduced from 0.125 mg. (Patient taking differently: Take 0.125 mg by mouth daily. Reduced from 0.125 mg.)     esomeprazole (NEXIUM) 40 MG capsule Take 40 mg by mouth every morning.      fluticasone (FLONASE) 50 MCG/ACT nasal spray Place 2 sprays into both nostrils daily.     furosemide  (LASIX ) 40 MG tablet Take 0.5 tablets (20 mg total) by mouth daily. 45 tablet 2   gabapentin  (NEURONTIN ) 100 MG capsule Take 100 mg by mouth 3 (three) times daily.     levothyroxine  (SYNTHROID ) 137 MCG tablet Take 137 mcg by mouth daily before breakfast.     metoprolol  succinate (TOPROL -XL) 25 MG 24 hr tablet Take 0.5 tablets (12.5 mg total) by mouth daily. Reduced from 50 mg daily. (Patient taking differently: Take 25 mg by mouth daily. Reduced from 50 mg daily.) 15 tablet 2    oxyCODONE -acetaminophen  (PERCOCET/ROXICET) 5-325 MG tablet Take 1 tablet by mouth 3 (three) times daily as needed for severe pain (pain score 7-10). Home med.     sacubitril -valsartan  (ENTRESTO ) 24-26 MG Take 1 tablet by mouth 2 (two) times daily. 60 tablet 5   sertraline  (ZOLOFT ) 100 MG tablet Take 200 mg by mouth daily.     spironolactone  (ALDACTONE ) 25 MG tablet Take 1 tablet (25 mg total) by mouth daily. 90 tablet 3   tirzepatide  (MOUNJARO ) 7.5 MG/0.5ML Pen Inject 7.5 mg into the skin once a week. 2 mL 0   No current facility-administered medications for this visit.   There were no vitals filed for this visit.  Wt Readings from Last 3 Encounters:  12/25/23 217 lb 12.8 oz (98.8 kg)  12/16/23 218 lb 6 oz (99.1 kg)  10/30/23 212 lb (96.2 kg)   Lab Results  Component Value Date   CREATININE 0.83 10/22/2023   CREATININE 0.68 09/11/2023   CREATININE 0.90 08/30/2023    PHYSICAL EXAM:  General: Well appearing.  Cor: No JVD. Regular rhythm, rate.  Lungs: clear Abdomen: soft, nontender, nondistended. Extremities: 1+ pitting edema bilateral lower legs up to shins Neuro:. Affect pleasant    ASSESSMENT & PLAN:  1. NICM with reduced ejection fraction- - cath 2003 and 2022 no CAD - Echo 02/18/14: EF 40-45% with Grade I DD - Echo 02/22/17: EF 45-50% with Grade I DD - Echo 03/24/19: EF 45-50% with mild LVH, Grade I DD, borderline aortic root dilatation 37 mm  -Echo 03/21/20: EF 40-45% with Grade I DD, borderline aortic root dilatation 37 mm - Echo 10/03/22: EF 30-35% with moderate LVH, Grade I DD, mild LAE - 12/13/22: Echo LVEF 25-30%, normal RV function.  - 12/24 Myoview   EF 40% - Echo 7/25 EF 30-35% RV ok RVSP normal Personally reviewed -  CPX 8/25: pVO2: 8.9 (51%) VE/VCO2 slope:  51 OUES: 0.6 (30% of predicted) Peak RER: 1.22 (see note below about RER) Ventilatory Threshold: 6.4 37% predicted or measured peak VO2  - NYHA IIIB  - minimally fluid up with swelling / symptoms & elevated  Optivol reading - increase lasix  to 40mg  daily X 3 days and then decrease it to 20mg  daily - continue entresto  24/26mg  BID. Current BP does not allow for titration - continue spironolactone  25mg  daily - continue metoprolol  succinate 25mg  daily - continue digoxin  0.0625mg  daily. Dig level 10/22/23 was 0.6 - unable to tolerate jardiance  due to rash - CPX testing and symptoms seem out of proportion to echo findings. CPX suggests need for advanced therapies  - RHC 10/30/23 with normal hemodynamics. - VQ scan 10/24/23 negative for PE - had labs earlier today at PCP office  2. RV Failure- - TTE from 10/03/22 with at least moderately reduced RV function 2/2 acute PE; suspect this has improved after thrombectomy.  - TTE 12/13/22 w/ LVEF 25%-30%, with low normal RV function now. Euvolemic on exam with no clinical signs of RV failure - Echo 7/25 EF 30-35% RV ok RVSP normal  3. H/o VT- - s/p MDT ICD - Device interrogation as above. No VT/AF. Optivol has been rising since 10/23/23. Activity level at 1.5hr/day   4. Recurrent DVT/PE- - s/p thrombectomy 09/24 - 12/13/22: Echo LVEF 25-30%, normal RV function.  - continue lifelong Eliquis  -> after 1 year can decrease to 2.5 bid per [employer]-EXT data - No evidence of RV strain on echo   5. Morbid obesity- - Body mass index is 34.11 kg/ m2 - On Mounjao 5mg  weekly  6. OSA - compliant with OSA   Return in 1 month, sooner if needed.   I spent 40 minutes reviewing records, interviewing/ examing patient and managing plan/ orders.   Ellouise DELENA Class, FNP -C 01/27/2024  "

## 2024-01-28 ENCOUNTER — Ambulatory Visit: Admitting: Family

## 2024-01-28 ENCOUNTER — Encounter: Payer: Self-pay | Admitting: Family

## 2024-01-28 VITALS — BP 108/72 | HR 88 | Wt 216.2 lb

## 2024-01-28 DIAGNOSIS — Z7901 Long term (current) use of anticoagulants: Secondary | ICD-10-CM | POA: Diagnosis not present

## 2024-01-28 DIAGNOSIS — Z79899 Other long term (current) drug therapy: Secondary | ICD-10-CM | POA: Diagnosis not present

## 2024-01-28 DIAGNOSIS — Z86718 Personal history of other venous thrombosis and embolism: Secondary | ICD-10-CM | POA: Diagnosis not present

## 2024-01-28 DIAGNOSIS — I472 Ventricular tachycardia, unspecified: Secondary | ICD-10-CM | POA: Diagnosis not present

## 2024-01-28 DIAGNOSIS — Z79890 Hormone replacement therapy (postmenopausal): Secondary | ICD-10-CM | POA: Diagnosis not present

## 2024-01-28 DIAGNOSIS — I5022 Chronic systolic (congestive) heart failure: Secondary | ICD-10-CM | POA: Diagnosis not present

## 2024-01-28 DIAGNOSIS — Z8249 Family history of ischemic heart disease and other diseases of the circulatory system: Secondary | ICD-10-CM | POA: Insufficient documentation

## 2024-01-28 DIAGNOSIS — Z6833 Body mass index (BMI) 33.0-33.9, adult: Secondary | ICD-10-CM | POA: Insufficient documentation

## 2024-01-28 DIAGNOSIS — G4733 Obstructive sleep apnea (adult) (pediatric): Secondary | ICD-10-CM | POA: Diagnosis not present

## 2024-01-28 DIAGNOSIS — Z6834 Body mass index (BMI) 34.0-34.9, adult: Secondary | ICD-10-CM

## 2024-01-28 DIAGNOSIS — I110 Hypertensive heart disease with heart failure: Secondary | ICD-10-CM | POA: Diagnosis present

## 2024-01-28 DIAGNOSIS — I50810 Right heart failure, unspecified: Secondary | ICD-10-CM

## 2024-01-28 MED ORDER — FUROSEMIDE 40 MG PO TABS: 40.0000 mg | ORAL_TABLET | ORAL | Status: AC

## 2024-01-28 MED ORDER — TORSEMIDE 20 MG PO TABS: ORAL_TABLET | ORAL | 5 refills | Status: AC

## 2024-01-28 NOTE — Patient Instructions (Signed)
 Change how you take Torsemide: Take 20 MG daily tablet Monday, Wednesday and Friday and  40 MG daily on Tuesday, Thursday, Saturday and Sunday  New prescription sent to pharmacy

## 2024-02-11 ENCOUNTER — Other Ambulatory Visit: Payer: Self-pay | Admitting: Pharmacist

## 2024-02-11 MED ORDER — MOUNJARO 10 MG/0.5ML ~~LOC~~ SOAJ: 10.0000 mg | SUBCUTANEOUS | 0 refills | Status: DC

## 2024-02-22 ENCOUNTER — Ambulatory Visit: Admission: EM | Admit: 2024-02-22 | Discharge: 2024-02-22 | Disposition: A | Discharge status: Home / Self Care

## 2024-02-22 ENCOUNTER — Other Ambulatory Visit: Payer: Self-pay

## 2024-02-22 DIAGNOSIS — Z8619 Personal history of other infectious and parasitic diseases: Secondary | ICD-10-CM

## 2024-02-22 DIAGNOSIS — J0100 Acute maxillary sinusitis, unspecified: Secondary | ICD-10-CM

## 2024-02-22 LAB — POC COVID19/FLU A&B COMBO
Covid Antigen, POC: NEGATIVE
Influenza A Antigen, POC: NEGATIVE
Influenza B Antigen, POC: NEGATIVE

## 2024-02-22 MED ORDER — FLUCONAZOLE 150 MG PO TABS: 150.0000 mg | ORAL_TABLET | Freq: Every day | ORAL | 0 refills | Status: AC

## 2024-02-22 MED ORDER — AMOXICILLIN-POT CLAVULANATE 875-125 MG PO TABS: 1.0000 | ORAL_TABLET | Freq: Two times a day (BID) | ORAL | 0 refills | Status: AC

## 2024-02-22 NOTE — Discharge Instructions (Addendum)
 Take the Augmentin  as directed for your sinus infection.  If you develop symptoms of a vaginal yeast infection, take the Diflucan  as directed.  Follow-up with your primary care provider if your symptoms are not improving.

## 2024-02-22 NOTE — ED Triage Notes (Signed)
 Pt being seen in UC for sinus pressure x1 week and headache x1 day. Pt reports doing sinus rinse this morning with no relief. Pt denies OTC medications. Pt denies N/V/D.

## 2024-02-22 NOTE — ED Provider Notes (Signed)
 " CAY RALPH PELT    CSN: [id number] Arrival date & time: 02/22/24  9176      History   Chief Complaint Chief Complaint  Patient presents with   Nasal Congestion   Headache    HPI Lisa Fuentes is a 64 y.o. female.  Patient presents with 1 week history of sinus congestion, postnasal drainage, runny nose, headache.  Treatment attempted with OTC cold medication and sinus rinses.  No fever, cough, shortness of breath, vomiting, diarrhea.  The history is provided by the patient and medical records.    Past Medical History:  Diagnosis Date   AICD (automatic cardioverter/defibrillator) present 2008   a.) Medtronic device placed 2008. b.) replaced 06/21/2013 (Medtronic Evera XT single-chamber ICD; model number [id number]).   Anemia    Anxiety    Aortic dilatation 03/24/2019   a.) TTE 03/24/2019: Ao root measured 37 mm. b.) TTE 03/21/2020: Ao root measured 37 mm.   Arthritis    Depression    Difficult intubation    DOE (dyspnea on exertion)    Endometriosis of vagina 09/2013   Intra-operative findings of endometriosis implants on cervical stump   Essential hypertension    GERD (gastroesophageal reflux disease)    Headache    HFrEF (heart failure with reduced ejection fraction) (HCC)    a.) TTE 02/18/2014: EF 40-45%; tiv AR, mild MR/TR/PR; G1DD. b.) TTE 02/22/2017: EF 45-50%; mild LVH; triv PR; G1DD. c.) TTE 03/24/2019: EF 45-50%; glob HK, mild LVH; triv MR/TR/PR. d.) TTE 03/21/2020: EF 40-45%; glob HK.   History of 2019 novel coronavirus disease (COVID-19) 02/02/2020   History of 2019 novel coronavirus disease (COVID-19) 02/02/2020   Hypokalemia    Hypothyroidism    a.) s/p radioactive iodine  Tx; on levothyroxine    Mixed hyperlipidemia    Nonischemic cardiomyopathy (HCC)    a.) TTE 02/18/2014: EF 40-45%. b.) TTE 02/22/2017: EF 45-50%. c.) TTE 03/24/2019: EF 45-50%. d.) TTE 03/21/2020: EF 40-45%   OSA on CPAP    Panic attacks    PSVT (paroxysmal supraventricular  tachycardia) 03/30/2020   a.) Holter 03/30/2020 --> 2 runs; fastest lasting 4 beats (184 bpm); longest lasting 4 beats (113 bpm)   Pulmonary embolism (HCC)    Tachycardia     Patient Active Problem List   Diagnosis Date Noted   Hypotension 08/28/2023   Acute renal failure 08/28/2023   Weakness 08/28/2023   Other fatigue 08/28/2023   Acute on chronic combined systolic and diastolic congestive heart failure (HCC) 08/27/2023   Varicose veins of leg with swelling, bilateral 01/18/2023   Swelling of limb 11/21/2022   Pulmonary emboli (HCC) 10/03/2022   Elevated troponin 10/03/2022   Chest pain 10/02/2022   Abnormal cardiovascular stress test    Anxiety 05/27/2019   Chronic pain syndrome 05/27/2019   Depression 05/27/2019   Morbid obesity (HCC) 03/03/2019   Tibialis posterior tendinitis 08/22/2017   Chest pain of uncertain etiology 09/10/2016   Mixed hyperlipidemia 09/10/2016   Low back pain 03/22/2016   Family history of malignant neoplasm of gastrointestinal tract    Dizziness 03/27/2015   Postoperative state 11/15/2014   LV dysfunction 10/29/2014   Knee pain 10/22/2014   Endometriosis 09/18/2014   ICD (implantable cardioverter-defibrillator) in place 02/02/2014   Chronic combined systolic (congestive) and diastolic (congestive) heart failure [facility])    Essential hypertension    Hypercholesteremia    Sleep apnea 05/12/2013   Hyperthyroidism 05/08/2013   Dilated cardiomyopathy (HCC) 05/08/2013    Past Surgical History:  Procedure Laterality  Date   ANTERIOR AND POSTERIOR REPAIR N/A 11/25/2017   Procedure: ANTERIOR (CYSTOCELE) AND POSTERIOR REPAIR (RECTOCELE);  Surgeon: Connell Davies, MD;  Location: [facility] ORS;  Service: Gynecology;  Laterality: N/A;   BREAST CYST ASPIRATION Left 03/22/2014   neg/ done by Dr Dellie   CARDIAC CATHETERIZATION  2003   [facility]: No significant coronary artery disease with reduced ejection fraction.   CARDIAC DEFIBRILLATOR PLACEMENT  04/2006   replaced  05/2013   CARDIAC DEFIBRILLATOR PLACEMENT  06/21/2013   Procedure:  CARDIAC DEFIBRILLATOR PLACEMENT (Medtronic single-chamber ICD, model number Sylvan HEWS, model number [id number]): Location: [facility]; Surgeon: Franky Ned, MD   CARPAL TUNNEL RELEASE Right    COLONOSCOPY  2015   COLONOSCOPY N/A 10/13/2020   Procedure: COLONOSCOPY;  Surgeon: Onita Elspeth Sharper, DO;  Location: [facility] ENDOSCOPY;  Service: Gastroenterology;  Laterality: N/A;   COLONOSCOPY WITH PROPOFOL  N/A 10/04/2015   Procedure: COLONOSCOPY WITH PROPOFOL ;  Surgeon: Rogelia Copping, MD;  Location: [facility] ENDOSCOPY;  Service: Endoscopy;  Laterality: N/A;   CORONARY ANGIOPLASTY     CYSTOSCOPY  11/15/2014   Procedure: CYSTOSCOPY;  Surgeon: Davies Connell, MD;  Location: [facility] ORS;  Service: Gynecology;;   DIAGNOSTIC LAPAROSCOPY     ETHMOIDECTOMY Right 07/19/2021   Procedure: TOTAL ETHMOIDECTOMY WITH FRONTAL SINUS EXPLORATION;  Surgeon: Edda Mt, MD;  Location: [facility] OR[facility];  Service: ENT;  Laterality: Right;   FOOT SURGERY Bilateral    heer spur and bunion   IMAGE GUIDED SINUS SURGERY N/A 07/19/2021   Procedure: IMAGE GUIDED SINUS SURGERY;  Surgeon: Edda Mt, MD;  Location: [facility] ORS;  Service: ENT;  Laterality: N/A;   KNEE ARTHROSCOPY Left 03/02/2015   Procedure: ARTHROSCOPY  LEFT KNEE, PARTIAL LATERAL  MENISECTOMY, SYNOVECTOMY, MEDIAL & LATERAL CHONDROPLASTY;  Surgeon: Franky Cranker, MD;  Location: [facility] OR[facility];  Service: Orthopedics;  Laterality: Left;   LAPAROSCOPIC SALPINGO OOPHERECTOMY Left 11/15/2014   Procedure: LAPAROSCOPIC OOPHORECTOMY;  Surgeon: Davies Connell, MD;  Location: [facility];  Service: Gynecology;  Laterality: Left;   LAPAROSCOPIC SALPINGOOPHERECTOMY Right 09/2013   also with left salpingectomy   LEFT HEART CATH AND CORONARY ANGIOGRAPHY N/A 03/28/2020   Procedure: LEFT HEART CATH AND CORONARY ANGIOGRAPHY;  Surgeon: Darron Deatrice LABOR, MD;  Location: [facility] INVASIVE CV LAB;  Service: Cardiovascular;  Laterality: N/A;    MAXILLARY ANTROSTOMY Right 07/19/2021   Procedure: MAXILLARY ANTROSTOMY WITH REMOVAL TISSUE;  Surgeon: Edda Mt, MD;  Location: [facility];  Service: ENT;  Laterality: Right;   MINOR HEMORRHOIDECTOMY     NASAL TURBINATE REDUCTION Bilateral 07/19/2021   Procedure: INFERIOR TURBINATE REDUCTION;  Surgeon: Edda Mt, MD;  Location: [facility];  Service: ENT;  Laterality: Bilateral;   PULMONARY THROMBECTOMY Bilateral 10/03/2022   Procedure: PULMONARY THROMBECTOMY;  Surgeon: Marea Selinda RAMAN, MD;  Location: [facility] INV[facility]IVE CV [facility];  Service: Cardiovascular;  Laterality: Bilateral;   RIGHT HEART CATH N/A 10/30/2023   Procedure: RIGHT HEART CATH;  Surgeon: Cherrie Toribio SAUNDERS, MD;  Location: [facility] INVASIVE CV LAB;  Service: Cardiovascular;  Laterality: N/A;   SEPTOPLASTY N/A 07/19/2021   Procedure: SEPTOPLASTY;  Surgeon: Edda Mt, MD;  Location: [facility];  Service: ENT;  Laterality: N/A;   TONSILLECTOMY     TOTAL ABDOMINAL HYSTERECTOMY     TRACHELECTOMY N/A 11/15/2014   Procedure: TRACHELECTOMY;  Surgeon: Davies Connell, MD;  Location: [facility];  Service: Gynecology;  Laterality: N/A;   TRIGGER FINGER RELEASE Right    TUBAL LIGATION Bilateral     OB History     Gravida  3  Para  3   Term  3   Preterm      AB      Living  2      SAB      IAB      Ectopic      Multiple      Live Births  3        Obstetric Comments  1st Menstrual Cycle:  12 1st Pregnancy:  21           Home Medications    Prior to Admission medications  Medication Sig Start Date End Date Taking? Authorizing Provider  amoxicillin -clavulanate (AUGMENTIN ) 875-125 MG tablet Take 1 tablet by mouth every 12 (twelve) hours. 02/22/24  Yes Corlis Burnard DEL, NP  fluconazole  (DIFLUCAN ) 150 MG tablet Take 1 tablet (150 mg total) by mouth daily. Take one tablet today.  May repeat in 3 days. 02/22/24  Yes Corlis Burnard DEL, NP  levothyroxine  (SYNTHROID ) 125 MCG tablet Take 125 mcg by mouth daily. 01/10/24  Yes [provider]  Acetaminophen  (TYLENOL  ARTHRITIS PAIN PO) Take 1,300 mg by mouth every 6 (six) hours as needed (Pain).    [provider]  apixaban  (ELIQUIS ) 5 MG TABS tablet Take 5 mg by mouth 2 (two) times daily.    [provider]  digoxin  (LANOXIN ) 0.125 MG tablet Take 0.5 tablets (0.0625 mg total) by mouth daily. Reduced from 0.125 mg. Patient taking differently: Take 0.125 mg by mouth daily. Reduced from 0.125 mg. 08/30/23   Awanda City, MD  esomeprazole (NEXIUM) 40 MG capsule Take 40 mg by mouth every morning.     [provider]  fluticasone (FLONASE) 50 MCG/ACT nasal spray Place 2 sprays into both nostrils daily. Patient not taking: Reported on 02/22/2024    [provider]  furosemide  (LASIX ) 40 MG tablet Take 1 tablet (40 mg total) by mouth every other day. 01/28/24 04/27/24  Donette City LABOR, FNP  gabapentin  (NEURONTIN ) 100 MG capsule Take 100 mg by mouth 3 (three) times daily.    [provider]  levothyroxine  (SYNTHROID ) 137 MCG tablet Take 137 mcg by mouth daily before breakfast. Patient taking differently: Take 125 mcg by mouth daily before breakfast. 11/14/23 11/13/24  [provider]  metoprolol  succinate (TOPROL -XL) 25 MG 24 hr tablet Take 0.5 tablets (12.5 mg total) by mouth daily. Reduced from 50 mg daily. Patient taking differently: Take 25 mg by mouth daily. Reduced from 50 mg daily. 08/30/23   Awanda City, MD  oxyCODONE -acetaminophen  (PERCOCET/ROXICET) 5-325 MG tablet Take 1 tablet by mouth 3 (three) times daily as needed for severe pain (pain score 7-10). Home med. 08/30/23   Awanda City, MD  sacubitril -valsartan  (ENTRESTO ) 24-26 MG Take 1 tablet by mouth 2 (two) times daily. 01/09/24   Donette City LABOR, FNP  sertraline  (ZOLOFT ) 100 MG tablet Take 200 mg by mouth daily. 03/04/20   [provider]  spironolactone  (ALDACTONE ) 25 MG tablet Take 1 tablet (25 mg total) by mouth daily. 12/30/23   Donette City LABOR, FNP  tirzepatide   (MOUNJARO ) 10 MG/0.5ML Pen Inject 10 mg into the skin once a week. Patient not taking: Reported on 02/22/2024 02/11/24   Donette City LABOR, FNP  torsemide  (DEMADEX ) 20 MG tablet Take 1 tablet (20 mg total) by mouth every Monday, Wednesday, and Friday AND 2 tablets (40 mg total) every Tuesday, Thursday, Saturday, and Sunday. 01/28/24   Donette City LABOR, FNP    Family History Family History  Problem Relation Age of Onset  Hypertension Mother    Hyperlipidemia Mother    Stroke Mother    Colon cancer Mother    Breast cancer Sister 6   Ovarian cancer Sister    Throat cancer Brother     Social History Social History[1]   Allergies   Rosuvastatin , Sulfa antibiotics, Sulfamethoxazole-trimethoprim, and Jardiance  [empagliflozin ]   Review of Systems Review of Systems  Constitutional:  Negative for chills and fever.  HENT:  Positive for congestion, postnasal drip, rhinorrhea and sinus pressure. Negative for ear pain and sore throat.   Respiratory:  Negative for cough and shortness of breath.   Gastrointestinal:  Negative for diarrhea and vomiting.     Physical Exam Triage Vital Signs ED Triage Vitals  Encounter Vitals Group     BP 02/22/24 0836 117/78     Girls Systolic BP Percentile --      Girls Diastolic BP Percentile --      Boys Systolic BP Percentile --      Boys Diastolic BP Percentile --      Pulse Rate 02/22/24 0836 85     Resp 02/22/24 0836 18     Temp 02/22/24 0836 97.9 F (36.6 C)     Temp Source 02/22/24 0836 Oral     SpO2 02/22/24 0836 95 %     Weight --      Height --      Head Circumference --      Peak Flow --      Pain Score 02/22/24 0831 7     Pain Loc --      Pain Education --      Exclude from Growth Chart --    No data found.  Updated Vital Signs BP 117/78 (BP Location: Left Arm)   Pulse 85   Temp 97.9 F (36.6 C) (Oral)   Resp 18   SpO2 95%   Visual Acuity Right Eye Distance:   Left Eye Distance:   Bilateral Distance:    Right Eye  Near:   Left Eye Near:    Bilateral Near:     Physical Exam Constitutional:      General: She is not in acute distress. HENT:     Right Ear: Tympanic membrane normal.     Left Ear: Tympanic membrane normal.     Nose: Congestion and rhinorrhea present.     Mouth/Throat:     Mouth: Mucous membranes are moist.     Pharynx: Oropharynx is clear.  Cardiovascular:     Rate and Rhythm: Normal rate and regular rhythm.     Heart sounds: Normal heart sounds.  Pulmonary:     Effort: Pulmonary effort is normal. No respiratory distress.     Breath sounds: Normal breath sounds.  Neurological:     Mental Status: She is alert.      UC Treatments / Results  Labs (all labs ordered are listed, but only abnormal results are displayed) Labs Reviewed  POC COVID19/FLU A&B COMBO    EKG   Radiology No results found.  Procedures Procedures (including critical care time)  Medications Ordered in UC Medications - No data to display  Initial Impression / Assessment and Plan / UC Course  I have reviewed the triage vital signs and the nursing notes.  Pertinent labs & imaging results that were available during my care of the patient were reviewed by me and considered in my medical decision making (see chart for details).    Acute sinusitis, history of vaginal yeast infection with  antibiotic use.  Afebrile and vital signs are stable.  Lungs are clear and O2 sat is 95% on room air.  Patient has been symptomatic for a week and is not improving with OTC treatment.  Treating today with Augmentin .  Diflucan  also prescribed today as patient reports she has history of vaginal yeast infection when taking antibiotics.  Education provided on sinus infection.  Instructed patient to follow-up with her PCP if she is not improving.  She agrees to plan of care.  Final Clinical Impressions(s) / UC Diagnoses   Final diagnoses:  Acute non-recurrent maxillary sinusitis  History of candidiasis of vagina      Discharge Instructions      Take the Augmentin  as directed for your sinus infection.  If you develop symptoms of a vaginal yeast infection, take the Diflucan  as directed.  Follow-up with your primary care provider if your symptoms are not improving.      ED Prescriptions     Medication Sig Dispense Auth. Provider   amoxicillin -clavulanate (AUGMENTIN ) 875-125 MG tablet Take 1 tablet by mouth every 12 (twelve) hours. 14 tablet Corlis Burnard DEL, NP   fluconazole  (DIFLUCAN ) 150 MG tablet Take 1 tablet (150 mg total) by mouth daily. Take one tablet today.  May repeat in 3 days. 2 tablet Corlis Burnard DEL, NP      PDMP not reviewed this encounter.    [1]  Social History Tobacco Use   Smoking status: Never   Smokeless tobacco: Never  Vaping Use   Vaping status: Never Used  Substance Use Topics   Alcohol use: Yes    Alcohol/week: 0.0 standard drinks of alcohol    Comment: occassionally   Drug use: No     Corlis Burnard DEL, NP 02/22/24 [phone]  "

## 2024-02-24 ENCOUNTER — Encounter: Admitting: Neurology

## 2024-03-02 ENCOUNTER — Encounter: Admitting: Neurology

## 2024-03-06 ENCOUNTER — Encounter: Payer: Self-pay | Admitting: Cardiology

## 2024-03-06 ENCOUNTER — Ambulatory Visit: Admitting: Cardiology

## 2024-03-06 VITALS — BP 115/68 | HR 80 | Ht 67.0 in | Wt 214.4 lb

## 2024-03-06 DIAGNOSIS — Z95810 Presence of automatic (implantable) cardiac defibrillator: Secondary | ICD-10-CM

## 2024-03-06 DIAGNOSIS — I428 Other cardiomyopathies: Secondary | ICD-10-CM

## 2024-03-06 DIAGNOSIS — Z6833 Body mass index (BMI) 33.0-33.9, adult: Secondary | ICD-10-CM

## 2024-03-06 MED ORDER — METOPROLOL SUCCINATE ER 25 MG PO TB24: 25.0000 mg | ORAL_TABLET | Freq: Every day | ORAL | 3 refills | Status: AC

## 2024-03-06 NOTE — Progress Notes (Signed)
 " Cardiology Office Note:    Date:  03/06/2024   ID:  Lisa Fuentes, DOB 1961-01-26, MRN [id number]  PCP:  Valora Lynwood FALCON, MD    [facility] Providers Cardiologist:  Redell Cave, MD Electrophysiologist:  Elspeth Sage, MD (Inactive)     Referring MD: Valora Lynwood FALCON, MD   Chief Complaint  Patient presents with   Follow-up    Pt doing good.    History of Present Illness:    Lisa Fuentes is a 64 y.o. female with a hx of NICM EF 30 to 35%, s/p ICD 2008, GEN change 2015, hypertension, hyperlipidemia, PE s/p endovascular thrombolysis 9/24 on Eliquis , obesity, OSA presents for follow-up.  States doing okay, denies chest pain or shortness of breath.  Compliant with current medications as prescribed.  Had successful weight loss with GLP-1, but stopped due to high cost.  Overall doing okay, no cardiac concerns at this time.   Prior notes Echo 7/25 EF 30 to 35% Echo 9/24 EF 30 to 35% Lexiscan  Myoview  9/24 no ischemia. Left heart cath 03/2020 no CAD Daughter also has heart failure. Not on Jardiance  due to yeast infections.  Past Medical History:  Diagnosis Date   AICD (automatic cardioverter/defibrillator) present 2008   a.) Medtronic device placed 2008. b.) replaced 06/21/2013 (Medtronic Evera XT single-chamber ICD; model number [id number]).   Anemia    Anxiety    Aortic dilatation 03/24/2019   a.) TTE 03/24/2019: Ao root measured 37 mm. b.) TTE 03/21/2020: Ao root measured 37 mm.   Arthritis    Depression    Difficult intubation    DOE (dyspnea on exertion)    Endometriosis of vagina 09/2013   Intra-operative findings of endometriosis implants on cervical stump   Essential hypertension    GERD (gastroesophageal reflux disease)    Headache    HFrEF (heart failure with reduced ejection fraction) (HCC)    a.) TTE 02/18/2014: EF 40-45%; tiv AR, mild MR/TR/PR; G1DD. b.) TTE 02/22/2017: EF 45-50%; mild LVH; triv PR; G1DD. c.) TTE 03/24/2019: EF 45-50%; glob HK,  mild LVH; triv MR/TR/PR. d.) TTE 03/21/2020: EF 40-45%; glob HK.   History of 2019 novel coronavirus disease (COVID-19) 02/02/2020   History of 2019 novel coronavirus disease (COVID-19) 02/02/2020   Hypokalemia    Hypothyroidism    a.) s/p radioactive iodine  Tx; on levothyroxine    Mixed hyperlipidemia    Nonischemic cardiomyopathy (HCC)    a.) TTE 02/18/2014: EF 40-45%. b.) TTE 02/22/2017: EF 45-50%. c.) TTE 03/24/2019: EF 45-50%. d.) TTE 03/21/2020: EF 40-45%   OSA on CPAP    Panic attacks    PSVT (paroxysmal supraventricular tachycardia) 03/30/2020   a.) Holter 03/30/2020 --> 2 runs; fastest lasting 4 beats (184 bpm); longest lasting 4 beats (113 bpm)   Pulmonary embolism (HCC)    Tachycardia     Past Surgical History:  Procedure Laterality Date   ANTERIOR AND POSTERIOR REPAIR N/A 11/25/2017   Procedure: ANTERIOR (CYSTOCELE) AND POSTERIOR REPAIR (RECTOCELE);  Surgeon: Connell Davies, MD;  Location: [facility];  Service: Gynecology;  Laterality: N/A;   BREAST CYST ASPIRATION Left 03/22/2014   neg/ done by Dr Dellie   CARDIAC CATHETERIZATION  2003   [facility]: No significant coronary artery disease with reduced ejection fraction.   CARDIAC DEFIBRILLATOR PLACEMENT  04/2006   replaced 05/2013   CARDIAC DEFIBRILLATOR PLACEMENT  06/21/2013   Procedure:  CARDIAC DEFIBRILLATOR PLACEMENT (Medtronic single-chamber ICD, model number Evera XT, model number [id number]): Location: [facility]; Surgeon: Franky Ned, MD  CARPAL TUNNEL RELEASE Right    COLONOSCOPY  2015   COLONOSCOPY N/A 10/13/2020   Procedure: COLONOSCOPY;  Surgeon: Onita Elspeth Sharper, DO;  Location: [facility];  Service: Gastroenterology;  Laterality: N/A;   COLONOSCOPY WITH PROPOFOL  N/A 10/04/2015   Procedure: COLONOSCOPY WITH PROPOFOL ;  Surgeon: Rogelia Copping, MD;  Location: [facility];  Service: Endoscopy;  Laterality: N/A;   CORONARY ANGIOPLASTY     CYSTOSCOPY  11/15/2014   Procedure: CYSTOSCOPY;  Surgeon: Archie Savers,  MD;  Location: [facility];  Service: Gynecology;;   DIAGNOSTIC LAPAROSCOPY     ETHMOIDECTOMY Right 07/19/2021   Procedure: TOTAL ETHMOIDECTOMY WITH FRONTAL SINUS EXPLORATION;  Surgeon: Edda Mt, MD;  Location: [facility];  Service: ENT;  Laterality: Right;   FOOT SURGERY Bilateral    heer spur and bunion   IMAGE GUIDED SINUS SURGERY N/A 07/19/2021   Procedure: IMAGE GUIDED SINUS SURGERY;  Surgeon: Edda Mt, MD;  Location: [facility];  Service: ENT;  Laterality: N/A;   KNEE ARTHROSCOPY Left 03/02/2015   Procedure: ARTHROSCOPY  LEFT KNEE, PARTIAL LATERAL  MENISECTOMY, SYNOVECTOMY, MEDIAL & LATERAL CHONDROPLASTY;  Surgeon: Franky Cranker, MD;  Location: [facility];  Service: Orthopedics;  Laterality: Left;   LAPAROSCOPIC SALPINGO OOPHERECTOMY Left 11/15/2014   Procedure: LAPAROSCOPIC OOPHORECTOMY;  Surgeon: Archie Savers, MD;  Location: [facility];  Service: Gynecology;  Laterality: Left;   LAPAROSCOPIC SALPINGOOPHERECTOMY Right 09/2013   also with left salpingectomy   LEFT HEART CATH AND CORONARY ANGIOGRAPHY N/A 03/28/2020   Procedure: LEFT HEART CATH AND CORONARY ANGIOGRAPHY;  Surgeon: Darron Deatrice LABOR, MD;  Location: [facility];  Service: Cardiovascular;  Laterality: N/A;   MAXILLARY ANTROSTOMY Right 07/19/2021   Procedure: MAXILLARY ANTROSTOMY WITH REMOVAL TISSUE;  Surgeon: Edda Mt, MD;  Location: [facility];  Service: ENT;  Laterality: Right;   MINOR HEMORRHOIDECTOMY     NASAL TURBINATE REDUCTION Bilateral 07/19/2021   Procedure: INFERIOR TURBINATE REDUCTION;  Surgeon: Edda Mt, MD;  Location: [facility];  Service: ENT;  Laterality: Bilateral;   PULMONARY THROMBECTOMY Bilateral 10/03/2022   Procedure: PULMONARY THROMBECTOMY;  Surgeon: Marea Selinda RAMAN, MD;  Location: [facility];  Service: Cardiovascular;  Laterality: Bilateral;   RIGHT HEART CATH N/A 10/30/2023   Procedure: RIGHT HEART CATH;  Surgeon: Cherrie Toribio SAUNDERS, MD;  Location: [facility];  Service:  Cardiovascular;  Laterality: N/A;   SEPTOPLASTY N/A 07/19/2021   Procedure: SEPTOPLASTY;  Surgeon: Edda Mt, MD;  Location: [facility];  Service: ENT;  Laterality: N/A;   TONSILLECTOMY     TOTAL ABDOMINAL HYSTERECTOMY     TRACHELECTOMY N/A 11/15/2014   Procedure: TRACHELECTOMY;  Surgeon: Archie Savers, MD;  Location: [facility];  Service: Gynecology;  Laterality: N/A;   TRIGGER FINGER RELEASE Right    TUBAL LIGATION Bilateral     Current Medications: Current Meds  Medication Sig   Acetaminophen  (TYLENOL  ARTHRITIS PAIN PO) Take 1,300 mg by mouth every 6 (six) hours as needed (Pain).   amoxicillin -clavulanate (AUGMENTIN ) 875-125 MG tablet Take 1 tablet by mouth every 12 (twelve) hours.   apixaban  (ELIQUIS ) 5 MG TABS tablet Take 5 mg by mouth 2 (two) times daily.   digoxin  (LANOXIN ) 0.125 MG tablet Take 0.5 tablets (0.0625 mg total) by mouth daily. Reduced from 0.125 mg. (Patient taking differently: Take 0.125 mg by mouth daily. Reduced from 0.125 mg.)   esomeprazole (NEXIUM) 40 MG capsule Take 40 mg by mouth every morning.    fluconazole  (DIFLUCAN ) 150 MG tablet Take 1 tablet (150 mg  total) by mouth daily. Take one tablet today.  May repeat in 3 days.   fluticasone (FLONASE) 50 MCG/ACT nasal spray Place 2 sprays into both nostrils daily. (Patient taking differently: Place 2 sprays into both nostrils as needed.)   furosemide  (LASIX ) 40 MG tablet Take 1 tablet (40 mg total) by mouth every other day.   gabapentin  (NEURONTIN ) 100 MG capsule Take 100 mg by mouth 3 (three) times daily.   levothyroxine  (SYNTHROID ) 125 MCG tablet Take 125 mcg by mouth daily.   levothyroxine  (SYNTHROID ) 137 MCG tablet Take 137 mcg by mouth daily before breakfast. (Patient taking differently: Take 125 mcg by mouth daily before breakfast.)   oxyCODONE -acetaminophen  (PERCOCET/ROXICET) 5-325 MG tablet Take 1 tablet by mouth 3 (three) times daily as needed for severe pain (pain score 7-10). Home med.   sacubitril -valsartan   (ENTRESTO ) 24-26 MG Take 1 tablet by mouth 2 (two) times daily.   sertraline  (ZOLOFT ) 100 MG tablet Take 200 mg by mouth daily.   spironolactone  (ALDACTONE ) 25 MG tablet Take 1 tablet (25 mg total) by mouth daily.   torsemide  (DEMADEX ) 20 MG tablet Take 1 tablet (20 mg total) by mouth every Monday, Wednesday, and Friday AND 2 tablets (40 mg total) every Tuesday, Thursday, Saturday, and Sunday.   [DISCONTINUED] metoprolol  succinate (TOPROL -XL) 25 MG 24 hr tablet Take 0.5 tablets (12.5 mg total) by mouth daily. Reduced from 50 mg daily. (Patient taking differently: Take 25 mg by mouth daily. Reduced from 50 mg daily.)     Allergies:   Rosuvastatin , Sulfa antibiotics, Sulfamethoxazole-trimethoprim, and Jardiance  [empagliflozin ]   Social History   Socioeconomic History   Marital status: Married    Spouse name: Jimmy   Number of children: 2   Years of education: Not on file   Highest education level: Not on file  Occupational History   Not on file  Tobacco Use   Smoking status: Never   Smokeless tobacco: Never  Vaping Use   Vaping status: Never Used  Substance and Sexual Activity   Alcohol use: Yes    Alcohol/week: 0.0 standard drinks of alcohol    Comment: occassionally   Drug use: No   Sexual activity: Not Currently    Birth control/protection: Surgical  Other Topics Concern   Not on file  Social History Narrative   Not on file   Social Drivers of Health   Tobacco Use: Low Risk (03/06/2024)   Patient History    Smoking Tobacco Use: Never    Smokeless Tobacco Use: Never    Passive Exposure: Not on file  Financial Resource Strain: Low Risk  (02/11/2024)   Received from [facility]   Overall Financial Resource Strain (CARDIA)    Difficulty of Paying Living Expenses: Not hard at all  Food Insecurity: No Food Insecurity (02/11/2024)   Received from [facility]   Epic    Within the past 12 months, you worried that your food would run out before  you got the money to buy more.: Never true    Within the past 12 months, the food you bought just didn't last and you didn't have money to get more.: Never true  Transportation Needs: No Transportation Needs (02/11/2024)   Received from [facility] - Transportation    In the past 12 months, has lack of transportation kept you from medical appointments or from getting medications?: No    Lack of Transportation (Non-Medical): No  Physical Activity: Not on file  Stress: Not on file  Social Connections: Not on file  Depression (PHQ2-9): Medium Risk (07/31/2023)   Depression (PHQ2-9)    PHQ-2 Score: 9  Alcohol Screen: Not on file  Housing: Low Risk  (02/11/2024)   Received from [facility]   Epic    In the last 12 months, was there a time when you were not able to pay the mortgage or rent on time?: No    In the past 12 months, how many times have you moved where you were living?: 0    At any time in the past 12 months, were you homeless or living in a shelter (including now)?: No  Utilities: Not At Risk (02/11/2024)   Received from [facility]   Epic    In the past 12 months has the electric, gas, oil, or water  company threatened to shut off services in your home?: No  Health Literacy: Not on file     Family History: The patient's family history includes Breast cancer (age of onset: 27) in her sister; Colon cancer in her mother; Hyperlipidemia in her mother; Hypertension in her mother; Ovarian cancer in her sister; Stroke in her mother; Throat cancer in her brother.  ROS:   Please see the history of present illness.     All other systems reviewed and are negative.  EKGs/Labs/Other Studies Reviewed:    The following studies were reviewed today:  EKG Interpretation Date/Time:  Friday March 06 2024 15:58:34 EST Ventricular Rate:  80 PR Interval:  144 QRS Duration:  72 QT Interval:  390 QTC Calculation: 449 R  Axis:   11  Text Interpretation: Normal sinus rhythm Nonspecific ST and T wave abnormality Confirmed by Darliss Rogue ([id number]) on 03/06/2024 4:00:08 PM    Recent Labs: 08/29/2023: Magnesium  2.2 08/30/2023: TSH 35.061 10/22/2023: ALT 18; BNP 34.8; BUN 14; Creatinine, Ser 0.83; Platelets 179 10/30/2023: Hemoglobin 12.2; Potassium 3.6; Sodium 141  Recent Lipid Panel    Component Value Date/Time   CHOL 216 (H) 09/11/2023 1104   TRIG 57 09/11/2023 1104   HDL 33 (L) 09/11/2023 1104   CHOLHDL 6.5 09/11/2023 1104   VLDL 11 09/11/2023 1104   LDLCALC 172 (H) 09/11/2023 1104     Risk Assessment/Calculations:             Physical Exam:    VS:  BP 115/68 (BP Location: Left Arm, Patient Position: Sitting, Cuff Size: Normal)   Pulse 80 Comment: 90 oximeter  Ht 5' 7 (1.702 m)   Wt 214 lb 6.4 oz (97.3 kg)   SpO2 97%   BMI 33.58 kg/m     Wt Readings from Last 3 Encounters:  03/06/24 214 lb 6.4 oz (97.3 kg)  01/28/24 216 lb 4 oz (98.1 kg)  12/25/23 217 lb 12.8 oz (98.8 kg)     GEN:  Well nourished, well developed in no acute distress HEENT: Normal NECK: No JVD; No carotid bruits CARDIAC: RRR, no murmurs, rubs, gallops RESPIRATORY:  Clear to auscultation without rales, wheezing or rhonchi  ABDOMEN: Soft, non-tender, non-distended MUSCULOSKELETAL:  No edema; No deformity  SKIN: Warm and dry NEUROLOGIC:  Alert and oriented x 3 PSYCHIATRIC:  Normal affect   ASSESSMENT:    1. NICM (nonischemic cardiomyopathy) (HCC)   2. ICD (implantable cardioverter-defibrillator) in place   3. BMI 33.0-33.9,adult    PLAN:    In order of problems listed above:  Nonischemic cardiomyopathy EF 35% s/p AICD.  Likely Hereditary  with daughter also  having heart failure.  BP much better, less fatigue.  Euvolemic on exam.  Continue Toprol -XL 12.5 mg daily, Entresto  24/26 mg twice daily, Aldactone  25 mg daily, digoxin  0.125 mg half a tab daily, Lasix  40 mg MWF. S/p AICD, monitoring as per device  clinic. Morbid obesity, history of PEs, heart failure.  Low-calorie diet advised.  GLP-1 cost prohibitive.  Follow-up in 12 months.     Medication Adjustments/Labs and Tests Ordered: Current medicines are reviewed at length with the patient today.  Concerns regarding medicines are outlined above.  Orders Placed This Encounter  Procedures   EKG 12-Lead   Meds ordered this encounter  Medications   metoprolol  succinate (TOPROL -XL) 25 MG 24 hr tablet    Sig: Take 1 tablet (25 mg total) by mouth daily. Reduced from 50 mg daily.    Dispense:  90 tablet    Refill:  3    Patient Instructions  Medication Instructions:  - take 25 mg metoprolol  daily   *If you need a refill on your cardiac medications before your next appointment, please call your pharmacy*  Lab Work: No labs ordered today  If you have labs (blood work) drawn today and your tests are completely normal, you will receive your results only by: MyChart Message (if you have MyChart) OR A paper copy in the mail If you have any lab test that is abnormal or we need to change your treatment, we will call you to review the results.  Testing/Procedures: No test ordered today   Follow-Up: At [facility], you and your health needs are our priority.  As part of our continuing mission to provide you with exceptional heart care, our providers are all part of one team.  This team includes your primary Cardiologist (physician) and Advanced Practice Providers or APPs (Physician Assistants and Nurse Practitioners) who all work together to provide you with the care you need, when you need it.  Your next appointment:   1 year(s)  Provider:   You may see Redell Cave, MD or one of the following Advanced Practice Providers on your designated Care Team:   Lonni Meager, NP Lesley Maffucci, PA-C Bernardino Bring, PA-C Cadence [place], PA-C Tylene Lunch, NP Barnie Hila, NP    We recommend signing up for the patient portal  called MyChart.  Sign up information is provided on this After Visit Summary.  MyChart is used to connect with patients for Virtual Visits (Telemedicine).  Patients are able to view lab/test results, encounter notes, upcoming appointments, etc.  Non-urgent messages can be sent to your provider as well.   To learn more about what you can do with MyChart, go to [email or web address].              Signed, Redell Cave, MD  03/06/2024 4:47 PM    [facility] "

## 2024-03-06 NOTE — Patient Instructions (Signed)
 Medication Instructions:  - take 25 mg metoprolol  daily   *If you need a refill on your cardiac medications before your next appointment, please call your pharmacy*  Lab Work: No labs ordered today  If you have labs (blood work) drawn today and your tests are completely normal, you will receive your results only by: MyChart Message (if you have MyChart) OR A paper copy in the mail If you have any lab test that is abnormal or we need to change your treatment, we will call you to review the results.  Testing/Procedures: No test ordered today   Follow-Up: At [facility], you and your health needs are our priority.  As part of our continuing mission to provide you with exceptional heart care, our providers are all part of one team.  This team includes your primary Cardiologist (physician) and Advanced Practice Providers or APPs (Physician Assistants and Nurse Practitioners) who all work together to provide you with the care you need, when you need it.  Your next appointment:   1 year(s)  Provider:   You may see Redell Cave, MD or one of the following Advanced Practice Providers on your designated Care Team:   Lonni Meager, NP Lesley Maffucci, PA-C Bernardino Bring, PA-C Cadence [place], PA-C Tylene Lunch, NP Barnie Hila, NP    We recommend signing up for the patient portal called MyChart.  Sign up information is provided on this After Visit Summary.  MyChart is used to connect with patients for Virtual Visits (Telemedicine).  Patients are able to view lab/test results, encounter notes, upcoming appointments, etc.  Non-urgent messages can be sent to your provider as well.   To learn more about what you can do with MyChart, go to [email or web address].

## 2024-03-09 ENCOUNTER — Ambulatory Visit: Admitting: Neurosurgery

## 2024-03-09 ENCOUNTER — Encounter: Admitting: Neurology

## 2024-03-24 ENCOUNTER — Encounter: Payer: Self-pay | Admitting: Neurology

## 2024-03-25 ENCOUNTER — Ambulatory Visit: Admitting: Neurosurgery

## 2024-04-28 ENCOUNTER — Ambulatory Visit: Admitting: Family

## 2024-06-02 ENCOUNTER — Encounter: Admitting: Vascular Surgery

## 2024-06-02 ENCOUNTER — Ambulatory Visit (HOSPITAL_COMMUNITY)

## 2024-06-16 ENCOUNTER — Ambulatory Visit

## 2024-09-15 ENCOUNTER — Ambulatory Visit

## 2024-12-15 ENCOUNTER — Ambulatory Visit

## 2025-03-16 ENCOUNTER — Ambulatory Visit

## 2025-06-15 ENCOUNTER — Ambulatory Visit

## 2025-09-14 ENCOUNTER — Ambulatory Visit

## 2025-12-14 ENCOUNTER — Ambulatory Visit

## 2026-03-15 ENCOUNTER — Ambulatory Visit
